# Patient Record
Sex: Male | Born: 1935 | Race: White | Hispanic: No | Marital: Married | Smoking: Never smoker
Health system: Southern US, Community
[De-identification: ages and names within clinical notes are randomized; demographics above are authoritative.]

## PROBLEM LIST (undated history)

## (undated) DIAGNOSIS — I2510 Atherosclerotic heart disease of native coronary artery without angina pectoris: Secondary | ICD-10-CM

## (undated) DIAGNOSIS — L309 Dermatitis, unspecified: Secondary | ICD-10-CM

## (undated) DIAGNOSIS — G4733 Obstructive sleep apnea (adult) (pediatric): Secondary | ICD-10-CM

## (undated) DIAGNOSIS — I4891 Unspecified atrial fibrillation: Secondary | ICD-10-CM

## (undated) DIAGNOSIS — E119 Type 2 diabetes mellitus without complications: Secondary | ICD-10-CM

## (undated) DIAGNOSIS — N183 Chronic kidney disease, stage 3 unspecified: Secondary | ICD-10-CM

## (undated) DIAGNOSIS — I209 Angina pectoris, unspecified: Secondary | ICD-10-CM

## (undated) DIAGNOSIS — G5600 Carpal tunnel syndrome, unspecified upper limb: Secondary | ICD-10-CM

## (undated) DIAGNOSIS — E538 Deficiency of other specified B group vitamins: Secondary | ICD-10-CM

## (undated) DIAGNOSIS — I252 Old myocardial infarction: Secondary | ICD-10-CM

## (undated) DIAGNOSIS — C4402 Squamous cell carcinoma of skin of lip: Secondary | ICD-10-CM

## (undated) DIAGNOSIS — M1990 Unspecified osteoarthritis, unspecified site: Secondary | ICD-10-CM

## (undated) DIAGNOSIS — R918 Other nonspecific abnormal finding of lung field: Secondary | ICD-10-CM

## (undated) DIAGNOSIS — D649 Anemia, unspecified: Secondary | ICD-10-CM

## (undated) DIAGNOSIS — I10 Essential (primary) hypertension: Secondary | ICD-10-CM

## (undated) DIAGNOSIS — E785 Hyperlipidemia, unspecified: Secondary | ICD-10-CM

## (undated) DIAGNOSIS — N400 Enlarged prostate without lower urinary tract symptoms: Secondary | ICD-10-CM

## (undated) HISTORY — PX: CORONARY ANGIOPLASTY WITH STENT PLACEMENT: SHX49

---

## 2003-12-13 ENCOUNTER — Ambulatory Visit (HOSPITAL_COMMUNITY): Admission: RE | Admit: 2003-12-13 | Discharge: 2003-12-14 | Payer: Self-pay | Admitting: *Deleted

## 2005-01-06 ENCOUNTER — Observation Stay (HOSPITAL_COMMUNITY): Admission: RE | Admit: 2005-01-06 | Discharge: 2005-01-07 | Payer: Self-pay | Admitting: Urology

## 2006-03-04 ENCOUNTER — Ambulatory Visit (HOSPITAL_BASED_OUTPATIENT_CLINIC_OR_DEPARTMENT_OTHER): Admission: RE | Admit: 2006-03-04 | Discharge: 2006-03-04 | Payer: Self-pay | Admitting: Urology

## 2006-05-13 ENCOUNTER — Encounter: Admission: RE | Admit: 2006-05-13 | Discharge: 2006-05-13 | Payer: Self-pay | Admitting: *Deleted

## 2006-05-13 IMAGING — CR DG CHEST 2V
2 series · 2 of 2 positions shown · non-contrast
Comparison: Report of [DATE] was reviewed.  The images were not available at the time of dictation.

CLINICAL DATA: Precardiac cath. 
CHEST ? 2 VIEW:

[w chest pa]
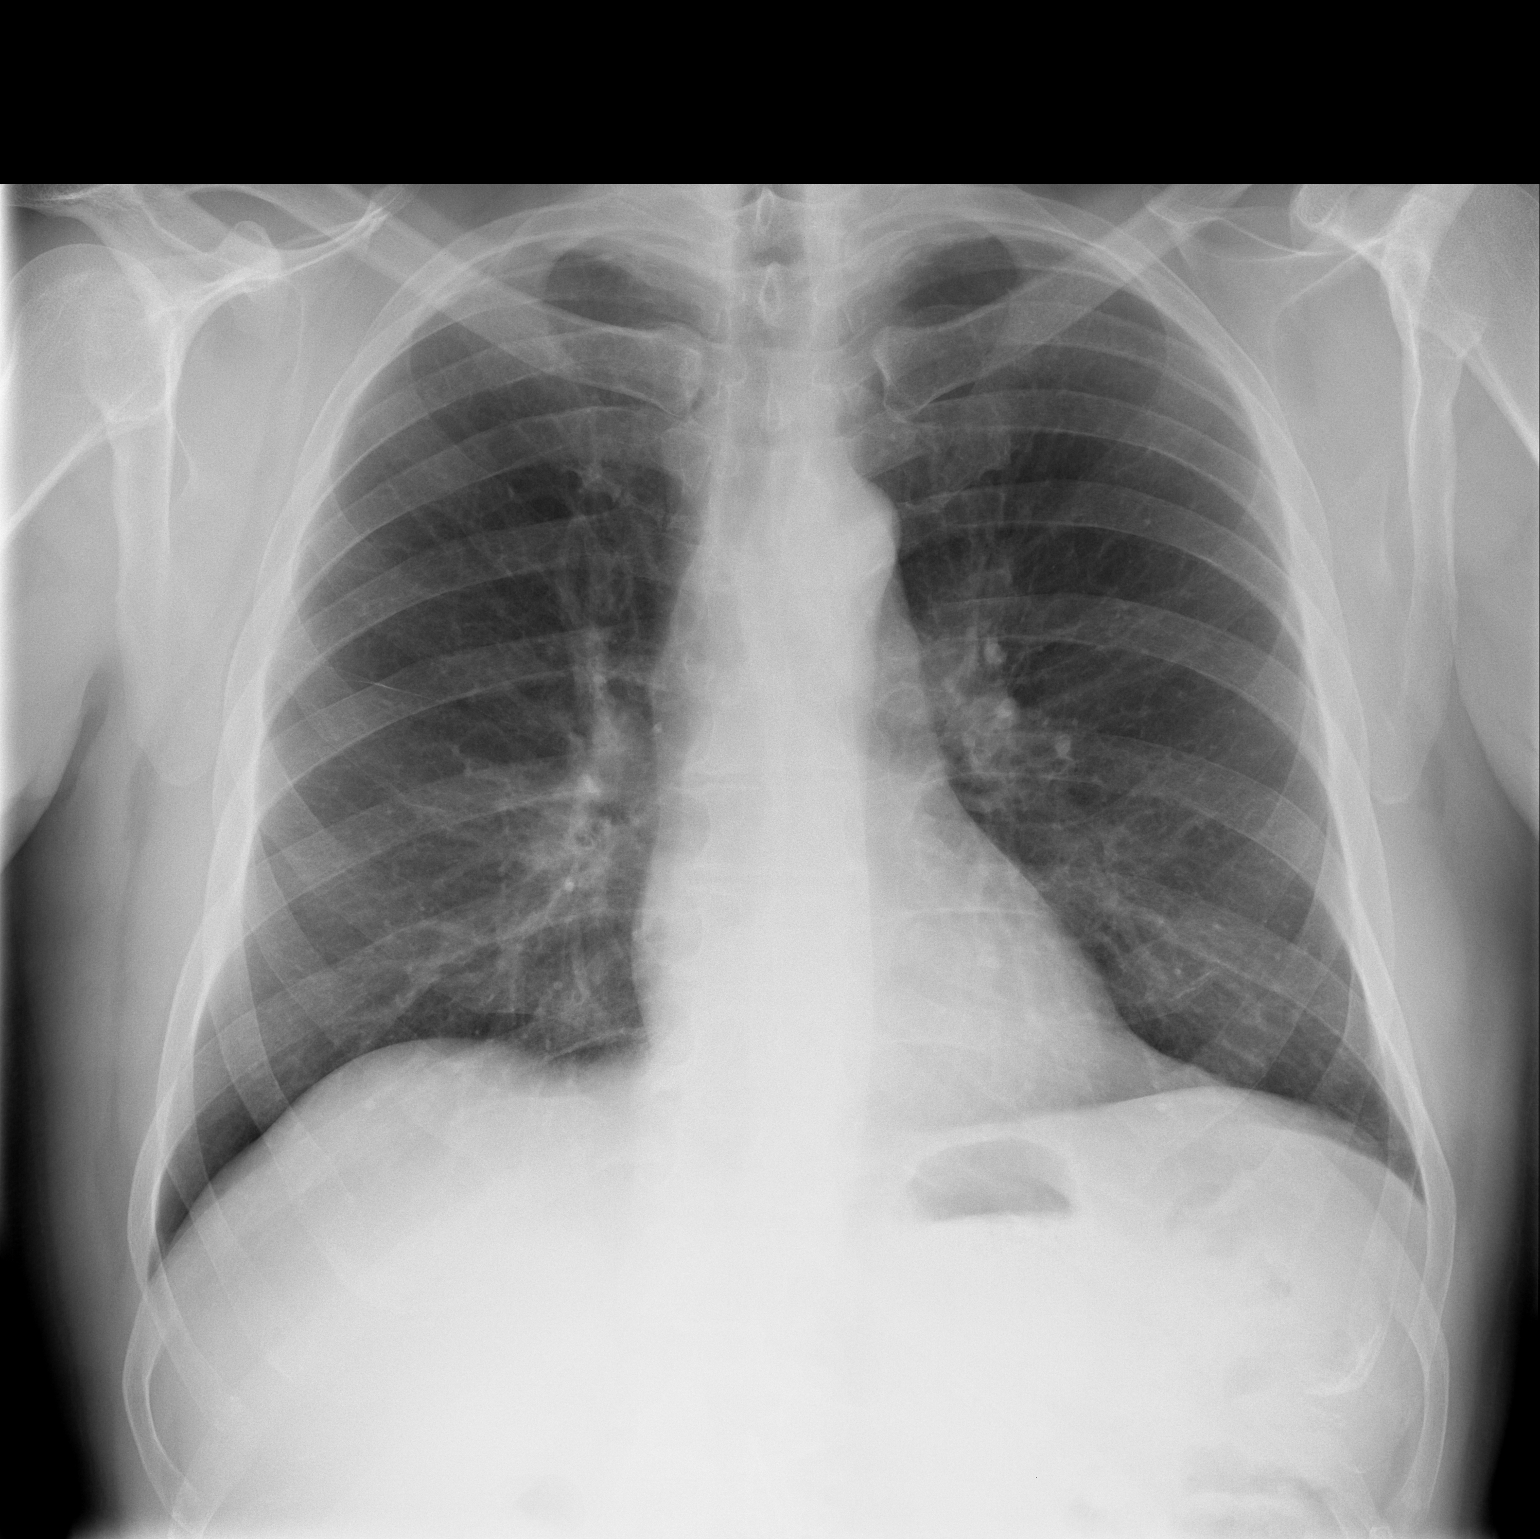

[w chest lat]
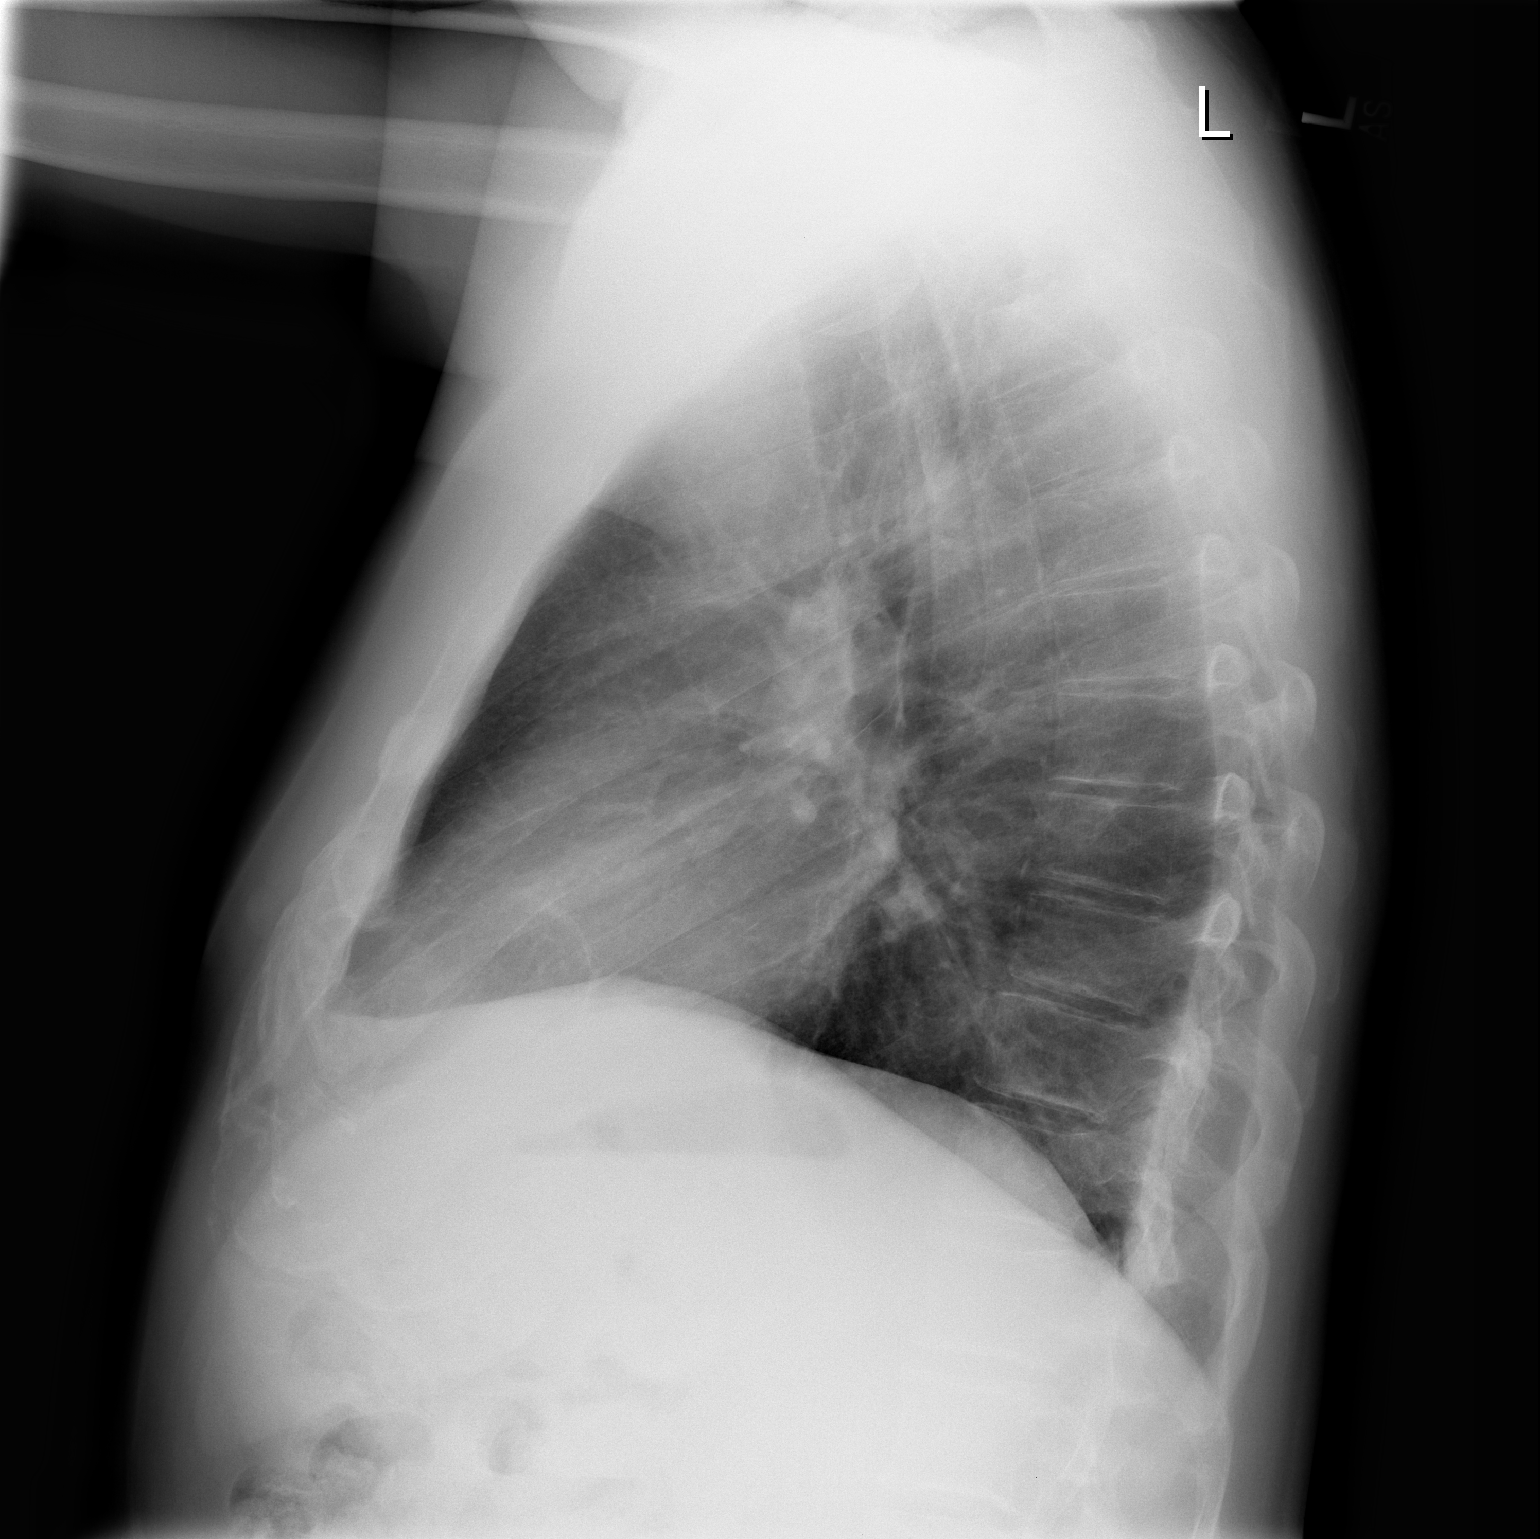

[2 of 2 positions shown; findings below may reference images not displayed]

FINDINGS: Trachea is midline.  Heart size normal.  car is seen in the right mid lung zone.  Biapical pleural thickening.  Lungs otherwise clear.  No pleural fluid.
IMPRESSION: No acute findings.

## 2006-05-20 ENCOUNTER — Ambulatory Visit (HOSPITAL_COMMUNITY): Admission: RE | Admit: 2006-05-20 | Discharge: 2006-05-21 | Payer: Self-pay | Admitting: *Deleted

## 2014-07-23 ENCOUNTER — Ambulatory Visit (HOSPITAL_BASED_OUTPATIENT_CLINIC_OR_DEPARTMENT_OTHER): Payer: Medicare HMO | Attending: Nurse Practitioner | Admitting: Radiology

## 2014-07-23 VITALS — Ht 71.0 in | Wt 190.0 lb

## 2014-07-23 DIAGNOSIS — G4733 Obstructive sleep apnea (adult) (pediatric): Secondary | ICD-10-CM | POA: Diagnosis present

## 2014-07-27 DIAGNOSIS — G4733 Obstructive sleep apnea (adult) (pediatric): Secondary | ICD-10-CM | POA: Diagnosis not present

## 2014-07-27 NOTE — Sleep Study (Signed)
   NAME: Duane [name] DATE OF BIRTH:  03/21/1935 MEDICAL RECORD NUMBER [id number]  LOCATION: [facility]  PHYSICIAN: Cheskel Silverio D  DATE OF STUDY: 07/23/2014  SLEEP STUDY TYPE: Nocturnal Polysomnogram               REFERRING PHYSICIAN: [name], NP  INDICATION FOR STUDY: Insomnia with sleep apnea  EPWORTH SLEEPINESS SCORE:   12/24 HEIGHT: 5\' 11"  (180.3 cm)  WEIGHT: 190 lb (86.183 kg)    Body mass index is 26.51 kg/(m^2).  NECK SIZE: 17 in.  MEDICATIONS: Charted for review  SLEEP ARCHITECTURE: Split study protocol. During the diagnostic phase, total sleep time 121.5 minutes with sleep efficiency 59.7%. Stage I was 35.4%, stage II 64.6%, stage III and REM were absent. Sleep latency 9.5 minutes, awake after sleep onset 74 minutes, arousal index 50.9, bedtime medication: None.  Sleep was markedly fragmented with repeated awakenings throughout the night.   RESPIRATORY DATA: Apnea hypopneas index (AHI) 21.2 per hour. 43 total events scored including 36 obstructive apneas, 2 mixed apneas, 5 hypopneas. Most events were while supine. CPAP was tolerated to 10 CWP, AHI 0 per hour. He wore a medium fullface mask.  OXYGEN DATA: Mild snoring before CPAP with oxygen desaturation to a nadir of 93% on room air. With CPAP control, snoring was prevented and mean oxygen saturation was 97.2% on room air.  CARDIAC DATA: Normal sinus rhythm  MOVEMENT/PARASOMNIA: No significant movement disturbance, bathroom x3  IMPRESSION/ RECOMMENDATION:   1) Moderate obstructive sleep apnea/hypopneas syndrome, AHI 21.2 per hour with supine events. Mild snoring with oxygen desaturation to a nadir of 93% on room air. 2) Successful CPAP titration to 10 CWP, AHI 0 per hour. He wore a medium ResMed AirFit F10 fullface mask with heated humidifier. Snoring was prevented and mean oxygen saturation was 97.2% on room air. 3) Market difficulty maintaining sleep throughout the night, only partly  explained by respiratory disturbance. Only a few minutes of sleep in REM. He likely will benefit from management for insomnia in addition to treatment for sleep apnea. Note sleep was also disturbed by nocturia x3.   Deneise Lever Diplomate, American Board of Sleep Medicine  ELECTRONICALLY SIGNED ON:  07/27/2014, 9:47 AM [place] PH: [phone]   FX: [phone] [place]

## 2016-09-08 ENCOUNTER — Encounter (HOSPITAL_BASED_OUTPATIENT_CLINIC_OR_DEPARTMENT_OTHER): Payer: Self-pay

## 2016-09-08 ENCOUNTER — Inpatient Hospital Stay (HOSPITAL_BASED_OUTPATIENT_CLINIC_OR_DEPARTMENT_OTHER)
Admission: EM | Admit: 2016-09-08 | Discharge: 2016-09-10 | DRG: 189 | Disposition: A | Discharge status: Home / Self Care | Payer: Medicare Other | Attending: Internal Medicine | Admitting: Internal Medicine

## 2016-09-08 ENCOUNTER — Emergency Department (HOSPITAL_BASED_OUTPATIENT_CLINIC_OR_DEPARTMENT_OTHER): Payer: Medicare Other

## 2016-09-08 DIAGNOSIS — M1990 Unspecified osteoarthritis, unspecified site: Secondary | ICD-10-CM | POA: Diagnosis not present

## 2016-09-08 DIAGNOSIS — N179 Acute kidney failure, unspecified: Secondary | ICD-10-CM | POA: Diagnosis present

## 2016-09-08 DIAGNOSIS — Z7984 Long term (current) use of oral hypoglycemic drugs: Secondary | ICD-10-CM | POA: Diagnosis not present

## 2016-09-08 DIAGNOSIS — R1011 Right upper quadrant pain: Secondary | ICD-10-CM

## 2016-09-08 DIAGNOSIS — N183 Chronic kidney disease, stage 3 (moderate): Secondary | ICD-10-CM | POA: Diagnosis not present

## 2016-09-08 DIAGNOSIS — I129 Hypertensive chronic kidney disease with stage 1 through stage 4 chronic kidney disease, or unspecified chronic kidney disease: Secondary | ICD-10-CM | POA: Diagnosis present

## 2016-09-08 DIAGNOSIS — F0390 Unspecified dementia without behavioral disturbance: Secondary | ICD-10-CM | POA: Diagnosis not present

## 2016-09-08 DIAGNOSIS — I2510 Atherosclerotic heart disease of native coronary artery without angina pectoris: Secondary | ICD-10-CM | POA: Diagnosis present

## 2016-09-08 DIAGNOSIS — I480 Paroxysmal atrial fibrillation: Secondary | ICD-10-CM | POA: Diagnosis present

## 2016-09-08 DIAGNOSIS — J9601 Acute respiratory failure with hypoxia: Secondary | ICD-10-CM | POA: Diagnosis not present

## 2016-09-08 DIAGNOSIS — R109 Unspecified abdominal pain: Secondary | ICD-10-CM | POA: Diagnosis present

## 2016-09-08 DIAGNOSIS — G4733 Obstructive sleep apnea (adult) (pediatric): Secondary | ICD-10-CM | POA: Diagnosis present

## 2016-09-08 DIAGNOSIS — Z7902 Long term (current) use of antithrombotics/antiplatelets: Secondary | ICD-10-CM | POA: Diagnosis not present

## 2016-09-08 DIAGNOSIS — I252 Old myocardial infarction: Secondary | ICD-10-CM

## 2016-09-08 DIAGNOSIS — D649 Anemia, unspecified: Secondary | ICD-10-CM | POA: Diagnosis present

## 2016-09-08 DIAGNOSIS — R0602 Shortness of breath: Secondary | ICD-10-CM

## 2016-09-08 DIAGNOSIS — Z7951 Long term (current) use of inhaled steroids: Secondary | ICD-10-CM

## 2016-09-08 DIAGNOSIS — R7989 Other specified abnormal findings of blood chemistry: Secondary | ICD-10-CM | POA: Diagnosis present

## 2016-09-08 DIAGNOSIS — E119 Type 2 diabetes mellitus without complications: Secondary | ICD-10-CM

## 2016-09-08 DIAGNOSIS — E871 Hypo-osmolality and hyponatremia: Secondary | ICD-10-CM | POA: Diagnosis present

## 2016-09-08 DIAGNOSIS — Z955 Presence of coronary angioplasty implant and graft: Secondary | ICD-10-CM | POA: Diagnosis not present

## 2016-09-08 DIAGNOSIS — J111 Influenza due to unidentified influenza virus with other respiratory manifestations: Secondary | ICD-10-CM

## 2016-09-08 DIAGNOSIS — Z888 Allergy status to other drugs, medicaments and biological substances status: Secondary | ICD-10-CM | POA: Diagnosis not present

## 2016-09-08 DIAGNOSIS — J9691 Respiratory failure, unspecified with hypoxia: Secondary | ICD-10-CM | POA: Diagnosis present

## 2016-09-08 DIAGNOSIS — Z79899 Other long term (current) drug therapy: Secondary | ICD-10-CM | POA: Diagnosis not present

## 2016-09-08 DIAGNOSIS — Z91041 Radiographic dye allergy status: Secondary | ICD-10-CM | POA: Diagnosis not present

## 2016-09-08 DIAGNOSIS — E1122 Type 2 diabetes mellitus with diabetic chronic kidney disease: Secondary | ICD-10-CM | POA: Diagnosis present

## 2016-09-08 DIAGNOSIS — Z91010 Allergy to peanuts: Secondary | ICD-10-CM | POA: Diagnosis not present

## 2016-09-08 DIAGNOSIS — Z7982 Long term (current) use of aspirin: Secondary | ICD-10-CM | POA: Diagnosis not present

## 2016-09-08 DIAGNOSIS — I10 Essential (primary) hypertension: Secondary | ICD-10-CM | POA: Diagnosis present

## 2016-09-08 DIAGNOSIS — Z85819 Personal history of malignant neoplasm of unspecified site of lip, oral cavity, and pharynx: Secondary | ICD-10-CM

## 2016-09-08 DIAGNOSIS — L309 Dermatitis, unspecified: Secondary | ICD-10-CM | POA: Diagnosis present

## 2016-09-08 DIAGNOSIS — E1140 Type 2 diabetes mellitus with diabetic neuropathy, unspecified: Secondary | ICD-10-CM | POA: Diagnosis present

## 2016-09-08 DIAGNOSIS — R69 Illness, unspecified: Secondary | ICD-10-CM

## 2016-09-08 DIAGNOSIS — E785 Hyperlipidemia, unspecified: Secondary | ICD-10-CM | POA: Diagnosis present

## 2016-09-08 DIAGNOSIS — R0902 Hypoxemia: Secondary | ICD-10-CM

## 2016-09-08 HISTORY — DX: Other nonspecific abnormal finding of lung field: R91.8

## 2016-09-08 HISTORY — DX: Anemia, unspecified: D64.9

## 2016-09-08 HISTORY — DX: Atherosclerotic heart disease of native coronary artery without angina pectoris: I25.10

## 2016-09-08 HISTORY — DX: Unspecified atrial fibrillation: I48.91

## 2016-09-08 HISTORY — DX: Chronic kidney disease, stage 3 (moderate): N18.3

## 2016-09-08 HISTORY — DX: Type 2 diabetes mellitus without complications: E11.9

## 2016-09-08 HISTORY — DX: Angina pectoris, unspecified: I20.9

## 2016-09-08 HISTORY — DX: Old myocardial infarction: I25.2

## 2016-09-08 HISTORY — DX: Unspecified osteoarthritis, unspecified site: M19.90

## 2016-09-08 HISTORY — DX: Obstructive sleep apnea (adult) (pediatric): G47.33

## 2016-09-08 HISTORY — DX: Hyperlipidemia, unspecified: E78.5

## 2016-09-08 HISTORY — DX: Deficiency of other specified B group vitamins: E53.8

## 2016-09-08 HISTORY — DX: Carpal tunnel syndrome, unspecified upper limb: G56.00

## 2016-09-08 HISTORY — DX: Squamous cell carcinoma of skin of lip: C44.02

## 2016-09-08 HISTORY — DX: Essential (primary) hypertension: I10

## 2016-09-08 HISTORY — DX: Benign prostatic hyperplasia without lower urinary tract symptoms: N40.0

## 2016-09-08 HISTORY — DX: Chronic kidney disease, stage 3 unspecified: N18.30

## 2016-09-08 HISTORY — DX: Dermatitis, unspecified: L30.9

## 2016-09-08 LAB — COMPREHENSIVE METABOLIC PANEL
ALT: 15 U/L — AB (ref 17–63)
AST: 23 U/L (ref 15–41)
Albumin: 3.8 g/dL (ref 3.5–5.0)
Alkaline Phosphatase: 61 U/L (ref 38–126)
Anion gap: 8 (ref 5–15)
BUN: 17 mg/dL (ref 6–20)
CHLORIDE: 97 mmol/L — AB (ref 101–111)
CO2: 23 mmol/L (ref 22–32)
CREATININE: 1.68 mg/dL — AB (ref 0.61–1.24)
Calcium: 8.6 mg/dL — ABNORMAL LOW (ref 8.9–10.3)
GFR calc Af Amer: 43 mL/min — ABNORMAL LOW (ref >60)
GFR calc non Af Amer: 37 mL/min — ABNORMAL LOW (ref >60)
GLUCOSE: 149 mg/dL — AB (ref 65–99)
Potassium: 4.3 mmol/L (ref 3.5–5.1)
SODIUM: 128 mmol/L — AB (ref 135–145)
Total Bilirubin: 0.9 mg/dL (ref 0.3–1.2)
Total Protein: 7.1 g/dL (ref 6.5–8.1)

## 2016-09-08 LAB — CBC WITH DIFFERENTIAL/PLATELET
BASOS ABS: 0 10*3/uL (ref 0.0–0.1)
Basophils Relative: 0 %
EOS ABS: 0 10*3/uL (ref 0.0–0.7)
EOS PCT: 0 %
HCT: 31.4 % — ABNORMAL LOW (ref 39.0–52.0)
Hemoglobin: 10.7 g/dL — ABNORMAL LOW (ref 13.0–17.0)
LYMPHS PCT: 14 %
Lymphs Abs: 2 10*3/uL (ref 0.7–4.0)
MCH: 27.1 pg (ref 26.0–34.0)
MCHC: 34.1 g/dL (ref 30.0–36.0)
MCV: 79.5 fL (ref 78.0–100.0)
Monocytes Absolute: 1.7 10*3/uL — ABNORMAL HIGH (ref 0.1–1.0)
Monocytes Relative: 12 %
Neutro Abs: 10.1 10*3/uL — ABNORMAL HIGH (ref 1.7–7.7)
Neutrophils Relative %: 74 %
PLATELETS: 171 10*3/uL (ref 150–400)
RBC: 3.95 MIL/uL — AB (ref 4.22–5.81)
RDW: 16.3 % — ABNORMAL HIGH (ref 11.5–15.5)
WBC: 13.8 10*3/uL — AB (ref 4.0–10.5)

## 2016-09-08 LAB — URINALYSIS, ROUTINE W REFLEX MICROSCOPIC
BILIRUBIN URINE: NEGATIVE
Glucose, UA: NEGATIVE mg/dL
HGB URINE DIPSTICK: NEGATIVE
Ketones, ur: NEGATIVE mg/dL
Leukocytes, UA: NEGATIVE
Nitrite: NEGATIVE
PH: 7.5 (ref 5.0–8.0)
Protein, ur: NEGATIVE mg/dL
SPECIFIC GRAVITY, URINE: 1.01 (ref 1.005–1.030)

## 2016-09-08 LAB — TROPONIN I: Troponin I: <0.03 ng/mL (ref <0.03)

## 2016-09-08 LAB — BRAIN NATRIURETIC PEPTIDE: B Natriuretic Peptide: 341.4 pg/mL — ABNORMAL HIGH (ref 0.0–100.0)

## 2016-09-08 LAB — INFLUENZA PANEL BY PCR (TYPE A & B)
H1N1 flu by pcr: NOT DETECTED
INFLAPCR: NEGATIVE
INFLBPCR: NEGATIVE

## 2016-09-08 LAB — LIPASE, BLOOD: LIPASE: 27 U/L (ref 11–51)

## 2016-09-08 LAB — I-STAT CG4 LACTIC ACID, ED: Lactic Acid, Venous: 1.49 mmol/L (ref 0.5–1.9)

## 2016-09-08 LAB — GLUCOSE, CAPILLARY: GLUCOSE-CAPILLARY: 145 mg/dL — AB (ref 65–99)

## 2016-09-08 IMAGING — CR DG CHEST 2V
2 series · 2 of 2 positions shown · non-contrast
Comparison: [DATE]

CLINICAL DATA: Weakness.  Pain all over.

EXAM:
CHEST  2 VIEW

[w chest pa]
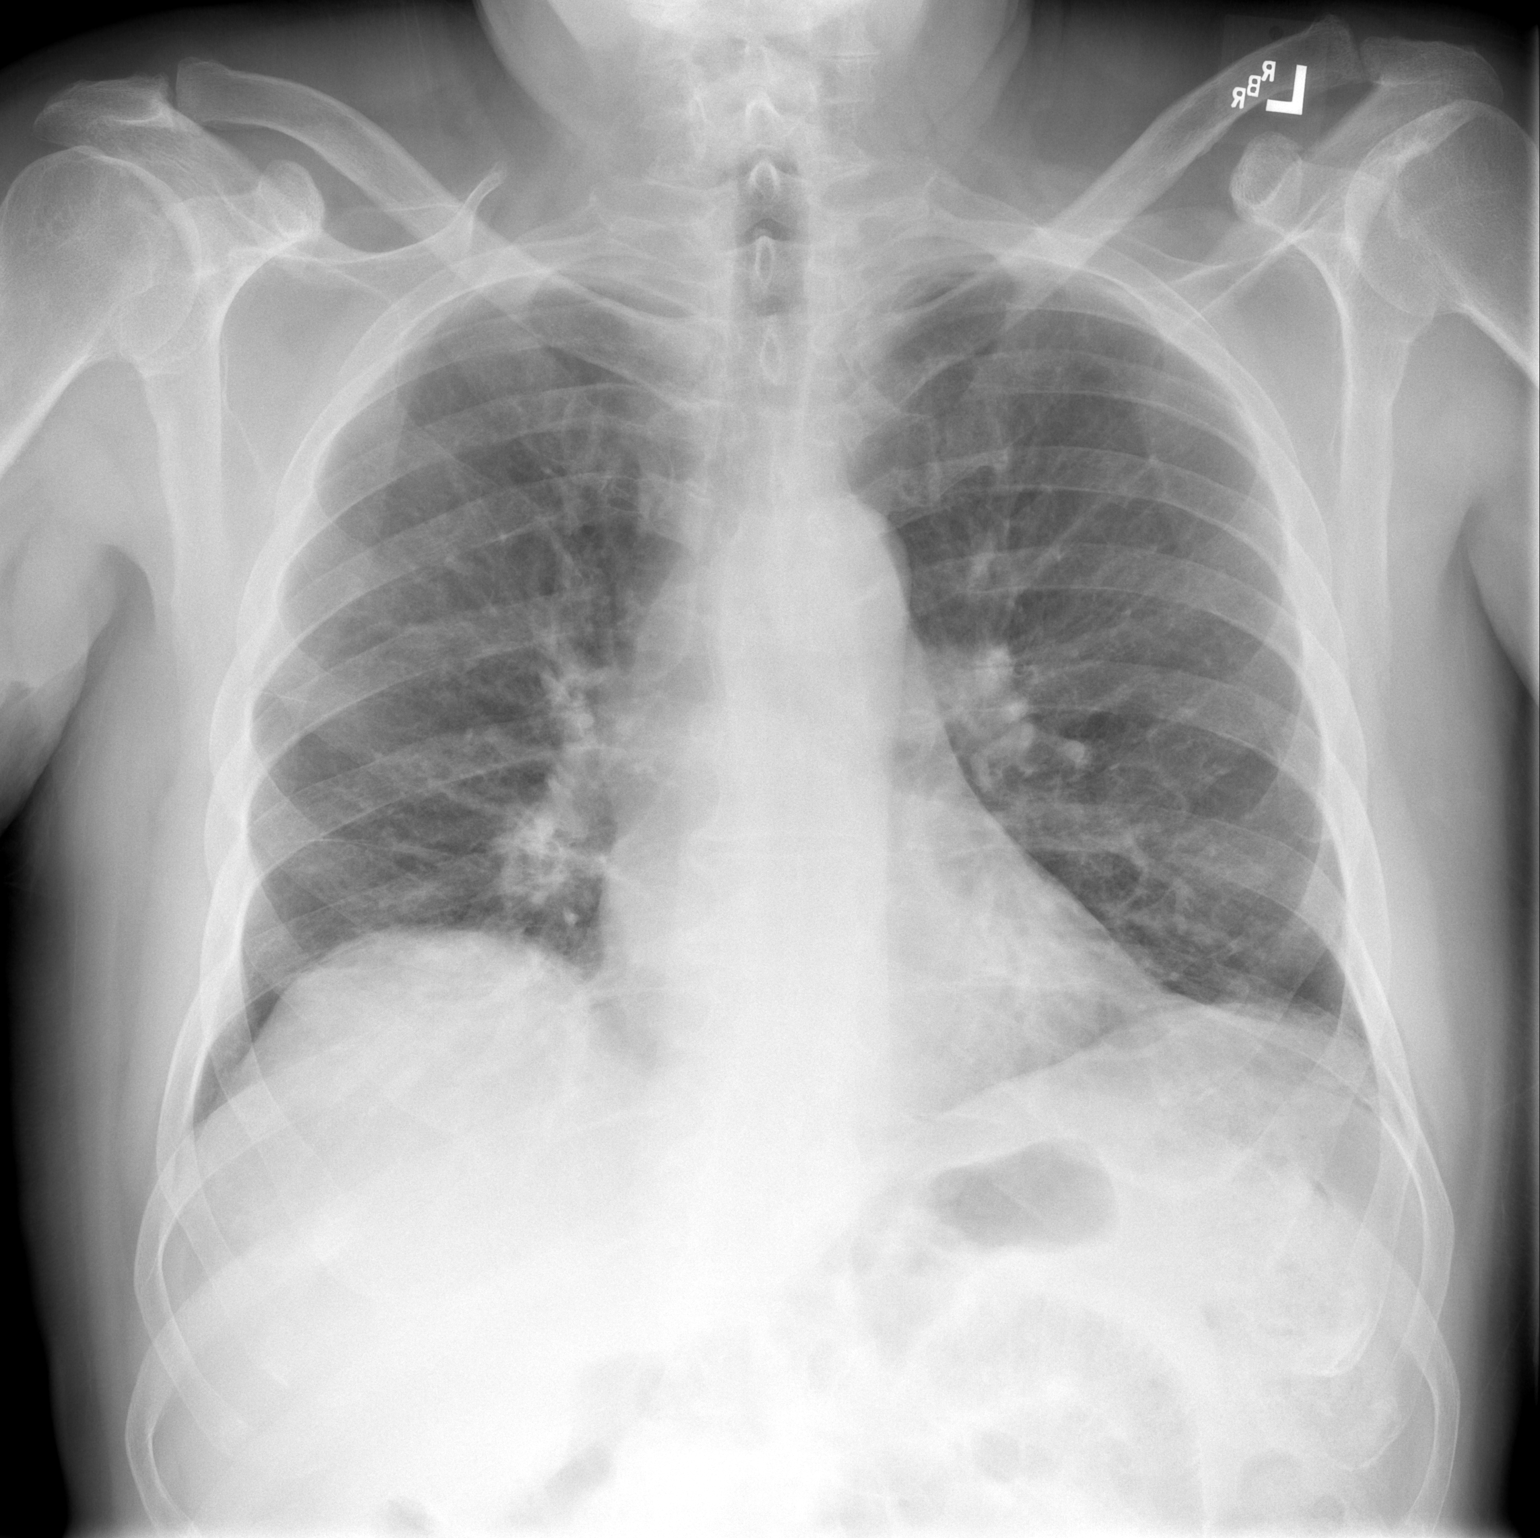

[w chest lat]
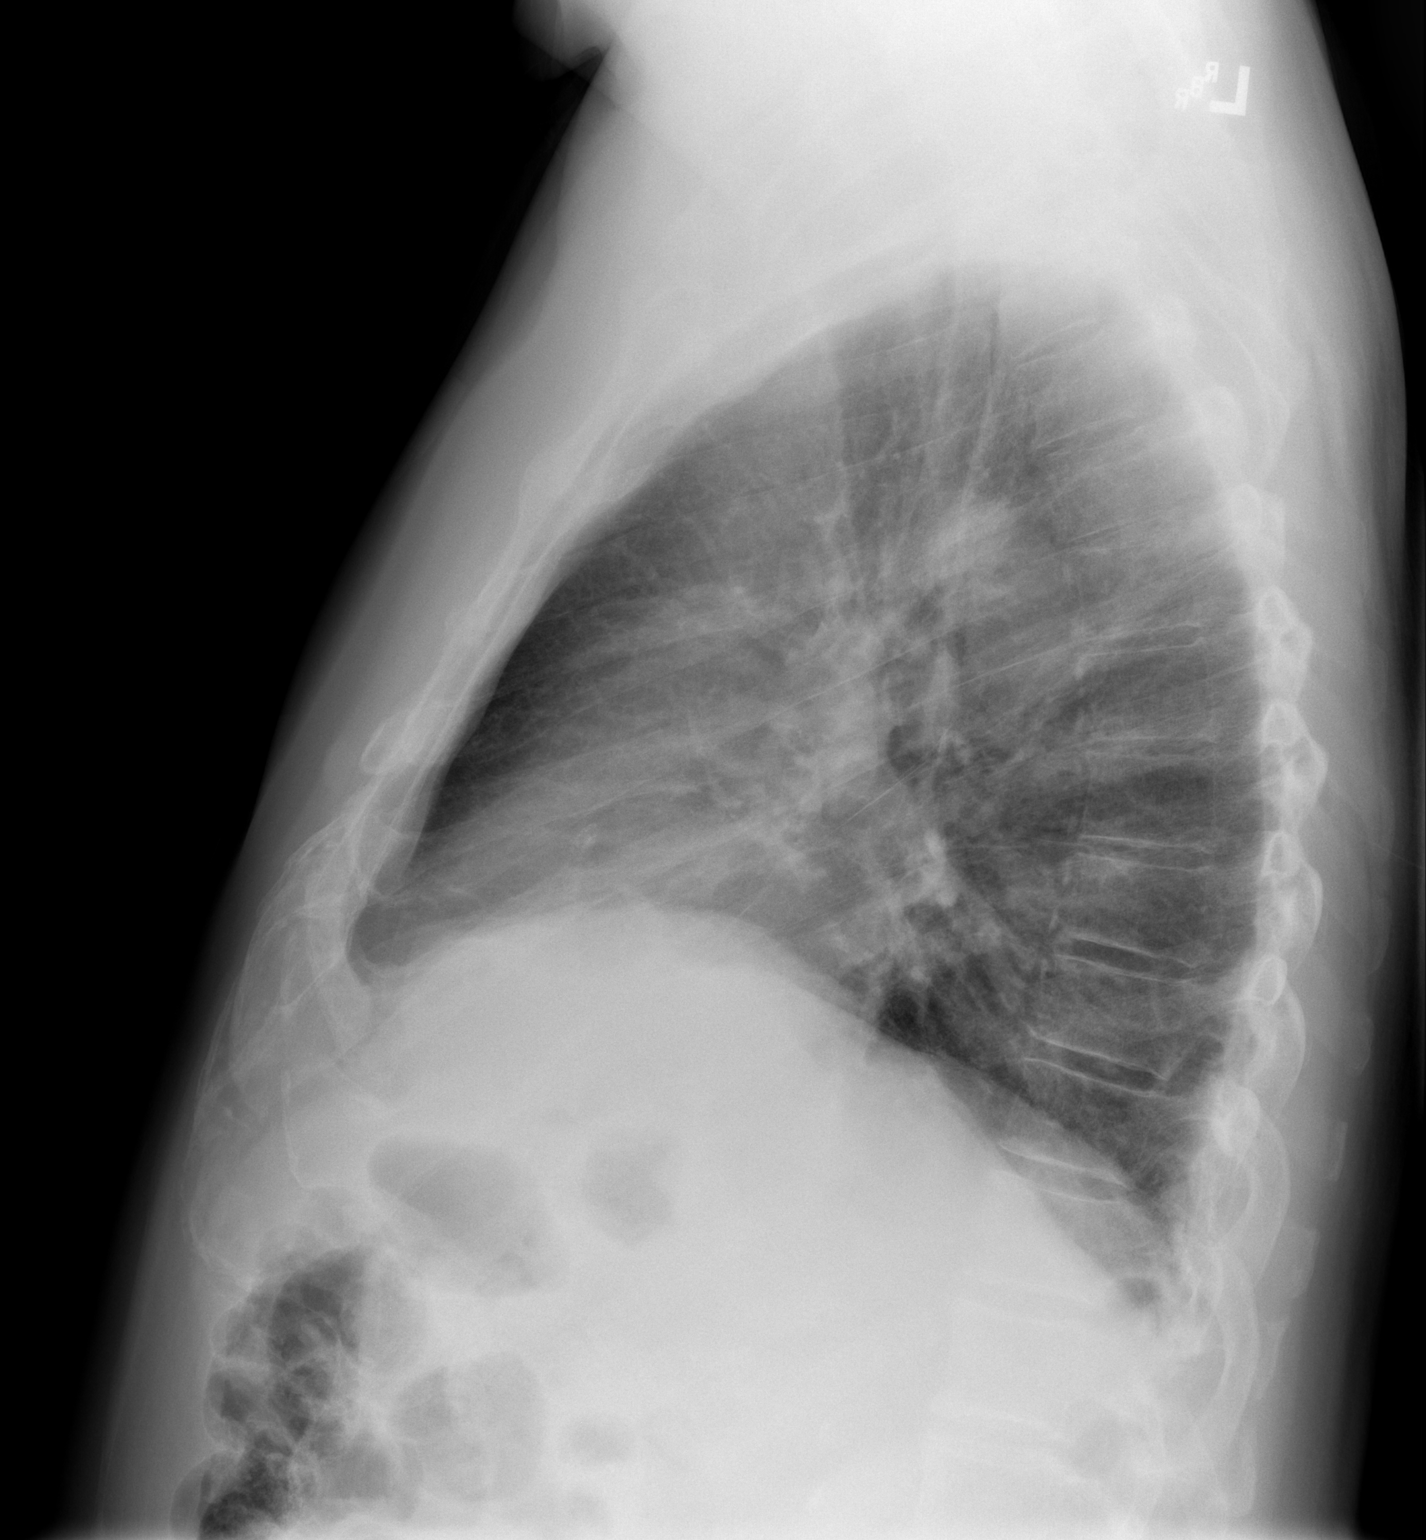

[2 of 2 positions shown; findings below may reference images not displayed]

FINDINGS: The heart size and mediastinal contours are within normal limits.
Both lungs are clear. No pleural effusion or pneumothorax. The
visualized skeletal structures are unremarkable.
IMPRESSION: No active cardiopulmonary disease.

## 2016-09-08 MED ORDER — OSELTAMIVIR PHOSPHATE 75 MG PO CAPS
75.0000 mg | ORAL_CAPSULE | Freq: Once | ORAL | Status: AC
  Administered 2016-09-08: 75 mg via ORAL
  Filled 2016-09-08: qty 1

## 2016-09-08 MED ORDER — DEXTROSE 5 % IV SOLN
2.0000 g | INTRAVENOUS | Status: DC
  Administered 2016-09-08: 2 g via INTRAVENOUS
  Filled 2016-09-08 (×2): qty 2

## 2016-09-08 MED ORDER — AMIODARONE HCL 200 MG PO TABS
200.0000 mg | ORAL_TABLET | Freq: Every day | ORAL | Status: DC
Start: 2016-09-09 — End: 2016-09-10
  Administered 2016-09-08 – 2016-09-10 (×3): 200 mg via ORAL
  Filled 2016-09-08 (×3): qty 1

## 2016-09-08 MED ORDER — ONDANSETRON HCL 4 MG/2ML IJ SOLN
4.0000 mg | Freq: Four times a day (QID) | INTRAMUSCULAR | Status: DC | PRN
Start: 2016-09-08 — End: 2016-09-10

## 2016-09-08 MED ORDER — ONDANSETRON HCL 4 MG PO TABS: 4.0000 mg | ORAL_TABLET | Freq: Four times a day (QID) | ORAL | Status: DC | PRN

## 2016-09-08 MED ORDER — INSULIN ASPART 100 UNIT/ML ~~LOC~~ SOLN
0.0000 [IU] | Freq: Three times a day (TID) | SUBCUTANEOUS | Status: DC
  Administered 2016-09-09: 2 [IU] via SUBCUTANEOUS
  Administered 2016-09-09: 1 [IU] via SUBCUTANEOUS
  Administered 2016-09-09 – 2016-09-10 (×2): 2 [IU] via SUBCUTANEOUS

## 2016-09-08 MED ORDER — METOPROLOL SUCCINATE ER 25 MG PO TB24
25.0000 mg | ORAL_TABLET | Freq: Every day | ORAL | Status: DC
  Administered 2016-09-08 – 2016-09-10 (×3): 25 mg via ORAL
  Filled 2016-09-08 (×3): qty 1

## 2016-09-08 MED ORDER — FLUTICASONE PROPIONATE 50 MCG/ACT NA SUSP
2.0000 | Freq: Every day | NASAL | Status: DC
  Administered 2016-09-09: 2 via NASAL
  Filled 2016-09-08: qty 16

## 2016-09-08 MED ORDER — GABAPENTIN 300 MG PO CAPS
300.0000 mg | ORAL_CAPSULE | Freq: Three times a day (TID) | ORAL | Status: DC
  Administered 2016-09-08: 300 mg via ORAL
  Filled 2016-09-08 (×3): qty 1

## 2016-09-08 MED ORDER — SODIUM CHLORIDE 0.9 % IV SOLN
INTRAVENOUS | Status: DC
  Administered 2016-09-08 – 2016-09-09 (×3): via INTRAVENOUS

## 2016-09-08 MED ORDER — ACETAMINOPHEN 325 MG PO TABS
650.0000 mg | ORAL_TABLET | Freq: Four times a day (QID) | ORAL | Status: DC | PRN
Start: 2016-09-08 — End: 2016-09-10
  Administered 2016-09-08: 650 mg via ORAL
  Filled 2016-09-08: qty 2

## 2016-09-08 MED ORDER — ASPIRIN 81 MG PO CHEW
81.0000 mg | CHEWABLE_TABLET | Freq: Every day | ORAL | Status: DC
  Administered 2016-09-08 – 2016-09-09 (×2): 81 mg via ORAL
  Filled 2016-09-08 (×3): qty 1

## 2016-09-08 MED ORDER — ACETAMINOPHEN 650 MG RE SUPP: 650.0000 mg | Freq: Four times a day (QID) | RECTAL | Status: DC | PRN

## 2016-09-08 MED ORDER — PRAVASTATIN SODIUM 40 MG PO TABS
40.0000 mg | ORAL_TABLET | Freq: Every day | ORAL | Status: DC
  Administered 2016-09-08 – 2016-09-09 (×2): 40 mg via ORAL
  Filled 2016-09-08 (×2): qty 1

## 2016-09-08 MED ORDER — AMLODIPINE BESYLATE 5 MG PO TABS
5.0000 mg | ORAL_TABLET | Freq: Every day | ORAL | Status: DC
  Administered 2016-09-08 – 2016-09-10 (×3): 5 mg via ORAL
  Filled 2016-09-08 (×3): qty 1

## 2016-09-08 MED ORDER — DONEPEZIL HCL 10 MG PO TABS
10.0000 mg | ORAL_TABLET | Freq: Every day | ORAL | Status: DC
  Administered 2016-09-09: 10 mg via ORAL
  Filled 2016-09-08: qty 1

## 2016-09-08 NOTE — ED Notes (Signed)
Patient placed on 2L via [place] due to O2 saturation decreasing during stay. Patient around 88-92% O2 upon applying O2 the patient's O2 saturation increased to 96%.

## 2016-09-08 NOTE — ED Notes (Signed)
Provider at bedside

## 2016-09-08 NOTE — ED Notes (Signed)
Report given to [facility] eta 35 min

## 2016-09-08 NOTE — Progress Notes (Signed)
Pharmacy Antibiotic Note  Duane [name] is a 81 y.o. male admitted on 09/08/2016 with intra-abdominal infection.  Pharmacy has been consulted for ceftriaxone dosing.  Plan: - ceftriaxone 2g iv q24h  Height: 5\' 11"  (180.3 cm) Weight: 211 lb 6.4 oz (95.9 kg) IBW/kg (Calculated) : 75.3  Temp (24hrs), Avg:99.1 F (37.3 C), Min:97.8 F (36.6 C), Max:100.9 F (38.3 C)   Recent Labs Lab 09/08/16 1405 09/08/16 1429  WBC 13.8*  --   CREATININE 1.68*  --   LATICACIDVEN  --  1.49    Estimated Creatinine Clearance: 42.1 mL/min (by C-G formula based on SCr of 1.68 mg/dL (H)).    Allergies  Allergen Reactions  . Contrast Media [Iodinated Diagnostic Agents] Anaphylaxis    Thank you for allowing pharmacy to be a part of this patient's care.  Lorie Melichar, Tsz-Yin 09/08/2016 9:43 PM

## 2016-09-08 NOTE — Progress Notes (Signed)
New Admission Note:  Arrival Method: Ambulance stretcher Mental Orientation: Alert and oriented x 4 Telemetry: Box 22  Assessment: Completed Skin: Warm and dry IV: NSL Pain: Denies  Tubes: N/A Safety Measures: Safety Fall Prevention Plan was given, discussed and signed. Admission: Completed [address] [place] Orientation: Patient has been orientated to the room, unit and the staff. Family: At bedside.  Patient is placed under droplet precautions.   Orders have been reviewed and implemented. Will continue to monitor the patient. Call light has been placed within reach and bed alarm has been activated.   Sima Matas BSN, RN  Phone Number: [phone]

## 2016-09-08 NOTE — H&P (Signed)
History and Physical    HUNT ZAJICEK ZOX:[id number] DOB: 1935-03-23 DOA: 09/08/2016  PCP: [name], MD  Patient coming from: Home.  Chief Complaint: Generalized body ache.  HPI: Duane [name] is a 81 y.o. male with history of paroxysmal atrial fibrillation, hypertension, chronic kidney disease, diabetes mellitus2 presents to the ER because of not feeling well over the last few days. Patient states he has been feeling weak and generalized body aches. Patient had gone to the ER [facility] day before and had CT abdomen and pelvis which as per the ER physician has been unremarkable. Results are not available to [place] at this time. Since patient has been having persistent symptoms patient was brought to the [facility]. At [facility] patient was found to be mildly febrile and hypoxic. Chest x-ray was unremarkable BNP was mildly elevated. Influenza PCR was done which was negative. Patient also has recently noticed increasing swelling in the lower extremities and also was recently started on amlodipine for blood pressure. Patient is being admitted for further management.  On my exam patient has significant right upper quadrant tenderness with increase on deep inspiration. Has been having poor appetite over the last few days. Denies nausea vomiting or diarrhea.  ED Course: See history of presenting illness.  Review of Systems: As per HPI, rest all negative.   Past Medical History:  Diagnosis Date  . A-fib ([place])   . Anemia   . Angina pectoris ([place])   . Arthritis   . Carpal tunnel syndrome   . Cobalamin deficiency   . Coronary artery disease   . Diabetes mellitus without complication ([place])   . Eczema   . Hyperlipemia   . Hypertension   . Kidney disease, chronic, stage III (moderate, EGFR 30-59 ml/min)   . Lung infiltrate   . Myocardial infarct, old   . Prostatic hypertrophy   . Sleep apnea, obstructive   . Squamous cell cancer of lip      Past Surgical History:  Procedure Laterality Date  . CORONARY ANGIOPLASTY WITH STENT PLACEMENT       reports that he has never smoked. He has never used smokeless tobacco. He reports that he does not drink alcohol. His drug history is not on file.  Allergies  Allergen Reactions  . Contrast Media [Iodinated Diagnostic Agents] Anaphylaxis    Family History  Problem Relation Age of Onset  . Hypertension Other     Prior to Admission medications   Medication Sig Start Date End Date Taking? Authorizing Provider  amiodarone (PACERONE) 200 MG tablet Take 200 mg by mouth daily.   Yes Historical Provider, MD  amLODipine (NORVASC) 5 MG tablet Take 5 mg by mouth daily.   Yes Historical Provider, MD  aspirin 81 MG chewable tablet Chew by mouth daily.   Yes Historical Provider, MD  clopidogrel (PLAVIX) 75 MG tablet Take 75 mg by mouth daily.   Yes Historical Provider, MD  donepezil (ARICEPT) 10 MG tablet Take 10 mg by mouth at bedtime.   Yes Historical Provider, MD  EPINEPHrine (EPIPEN IJ) Inject as directed.   Yes Historical Provider, MD  fluticasone (FLONASE) 50 MCG/ACT nasal spray Place into both nostrils daily.   Yes Historical Provider, MD  gabapentin (NEURONTIN) 300 MG capsule Take 300 mg by mouth 3 (three) times daily.   Yes Historical Provider, MD  losartan (COZAAR) 100 MG tablet Take 100 mg by mouth daily.   Yes Historical Provider,  MD  metformin (FORTAMET) 1000 MG (OSM) 24 hr tablet Take 1,000 mg by mouth 2 (two) times daily with a meal.   Yes Historical Provider, MD  metoprolol succinate (TOPROL-XL) 25 MG 24 hr tablet Take 25 mg by mouth daily.   Yes Historical Provider, MD  pravastatin (PRAVACHOL) 40 MG tablet Take 40 mg by mouth daily.   Yes Historical Provider, MD    Physical Exam: Vitals:   09/08/16 1700 09/08/16 1730 09/08/16 1800 09/08/16 1932  BP: (!) 115/49 (!) 135/53 (!) 136/53 (!) 134/51  Pulse: 72 86 76 72  Resp: 16 18 18 20   Temp:    97.8 F (36.6 C)  TempSrc:     Oral  SpO2: 95% 96% 94% 96%  Weight:    95.9 kg (211 lb 6.4 oz)  Height:          Constitutional: Moderately built and nourished. Vitals:   09/08/16 1700 09/08/16 1730 09/08/16 1800 09/08/16 1932  BP: (!) 115/49 (!) 135/53 (!) 136/53 (!) 134/51  Pulse: 72 86 76 72  Resp: 16 18 18 20   Temp:    97.8 F (36.6 C)  TempSrc:    Oral  SpO2: 95% 96% 94% 96%  Weight:    95.9 kg (211 lb 6.4 oz)  Height:       Eyes: Anicteric no pallor. ENMT: No discharge from the ears eyes nose or mouth. Neck: No mass felt. No JVD appreciated. Respiratory: No rhonchi or crepitations. Cardiovascular: S1 and S2 heard. No murmurs appreciated. Abdomen: Right upper quadrant tenderness. No guarding or rigidity. Musculoskeletal: No edema. No joint effusion. Skin: No rash. Skin appears warm. Neurologic: Alert awake oriented to time place and person. Moves all extremities. Psychiatric: Appears normal. Has some memory issues.   Labs on Admission: I have personally reviewed following labs and imaging studies  CBC:  Recent Labs Lab 09/08/16 1405  WBC 13.8*  NEUTROABS 10.1*  HGB 10.7*  HCT 31.4*  MCV 79.5  PLT 948   Basic Metabolic Panel:  Recent Labs Lab 09/08/16 1405  NA 128*  K 4.3  CL 97*  CO2 23  GLUCOSE 149*  BUN 17  CREATININE 1.68*  CALCIUM 8.6*   GFR: Estimated Creatinine Clearance: 42.1 mL/min (by C-G formula based on SCr of 1.68 mg/dL (H)). Liver Function Tests:  Recent Labs Lab 09/08/16 1405  AST 23  ALT 15*  ALKPHOS 61  BILITOT 0.9  PROT 7.1  ALBUMIN 3.8    Recent Labs Lab 09/08/16 1405  LIPASE 27   No results for input(s): AMMONIA in the last 168 hours. Coagulation Profile: No results for input(s): INR, PROTIME in the last 168 hours. Cardiac Enzymes:  Recent Labs Lab 09/08/16 1405  TROPONINI <0.03   BNP (last 3 results) No results for input(s): PROBNP in the last 8760 hours. HbA1C: No results for input(s): HGBA1C in the last 72  hours. CBG:  Recent Labs Lab 09/08/16 1936  GLUCAP 145*   Lipid Profile: No results for input(s): CHOL, HDL, LDLCALC, TRIG, CHOLHDL, LDLDIRECT in the last 72 hours. Thyroid Function Tests: No results for input(s): TSH, T4TOTAL, FREET4, T3FREE, THYROIDAB in the last 72 hours. Anemia Panel: No results for input(s): VITAMINB12, FOLATE, FERRITIN, TIBC, IRON, RETICCTPCT in the last 72 hours. Urine analysis:    Component Value Date/Time   COLORURINE YELLOW 09/08/2016 1450   APPEARANCEUR CLEAR 09/08/2016 1450   LABSPEC 1.010 09/08/2016 1450   PHURINE 7.5 09/08/2016 1450   GLUCOSEU NEGATIVE 09/08/2016 1450   HGBUR  NEGATIVE 09/08/2016 1450   BILIRUBINUR NEGATIVE 09/08/2016 1450   KETONESUR NEGATIVE 09/08/2016 1450   PROTEINUR NEGATIVE 09/08/2016 1450   NITRITE NEGATIVE 09/08/2016 1450   LEUKOCYTESUR NEGATIVE 09/08/2016 1450   Sepsis Labs: @LABRCNTIP (procalcitonin:4,lacticidven:4) ) Recent Results (from the past 240 hour(s))  Blood culture (routine x 2)     Status: None (Preliminary result)   Collection Time: 09/08/16  4:41 PM  Result Value Ref Range Status   Specimen Description   Final    BLOOD RIGHT ANTECUBITAL Performed at [facility]    Special Requests BOTTLES DRAWN AEROBIC AND ANAEROBIC 5CC  Final   Culture PENDING  Incomplete   Report Status PENDING  Incomplete  Blood culture (routine x 2)     Status: None (Preliminary result)   Collection Time: 09/08/16  4:55 PM  Result Value Ref Range Status   Specimen Description   Final    BLOOD RIGHT HAND Performed at [facility]    Special Requests BOTTLES DRAWN AEROBIC AND ANAEROBIC 5CC  Final   Culture PENDING  Incomplete   Report Status PENDING  Incomplete     Radiological Exams on Admission: Dg Chest 2 View  Result Date: 09/08/2016 CLINICAL DATA:  Weakness.  Pain all over. EXAM: CHEST  2 VIEW COMPARISON:  09/07/2016 FINDINGS: The heart size and mediastinal contours are within normal limits. Both lungs  are clear. No pleural effusion or pneumothorax. The visualized skeletal structures are unremarkable. IMPRESSION: No active cardiopulmonary disease. Electronically Signed   By: Lajean Manes M.D.   On: 09/08/2016 14:10    EKG: Independently reviewed. Normal sinus rhythm with nonspecific ST changes.  Assessment/Plan Principal Problem:   Respiratory failure with hypoxia (HCC) Active Problems:   Abdominal pain, right upper quadrant   Essential hypertension   PAF (paroxysmal atrial fibrillation) (HCC)   Diabetes mellitus type 2, controlled ([place])   ARF (acute renal failure) (HCC)   Normochromic normocytic anemia   Abdominal pain    1. Abdominal pain mostly in the right upper quadrant - will check sonogram of the abdomen  for acute cholecystitis. Until then patient will be on clear liquid diet and ceftriaxone. Since patient also was mildly hypoxic and pain increases on deep inspiration there was concern for PE for which we will order a VQ scan and Dopplers of the lower extremity as patient also has lower extremity edema. 2. Hypoxia - check VQ scan. Patient has mildly elevated BNP though clinically looks dry. 3. Paroxysmal atrial fibrillation - presently in sinus rhythm. Continue amiodarone and beta blockers. Patient is only on aspirin and Plavix and not on anticoagulation not sure of the reason. May have to discuss with patient's son in a.m. Unable to reach son at this time. Holding off Plavix in anticipation of procedure. 4. Hypertension - will hold off Cozaar due to renal failure. Continue amlodipine and when necessary IV hydralazine. Closely follow metabolic panel. 5. Renal failure probably acute on chronic - gently hydrating at this time and holding Cozaar. Follow metabolic panel intake output. 6. Diabetes mellitus type 2 - on sliding-scale coverage. 7. Anemia - follow CBC.  Patient does have memory problems. Need to get further detailed history once patient's family available.  Blood  cultures are pending. Blood cultures were done since patient was mildly febrile on arrival.  I have reviewed patient's old charts in care everywhere.   DVT prophylaxis: SCDs. Code Status: Full code.  Family Communication: Unable to reach son.  Disposition Plan: Home.  Consults called: None.  Admission status: Inpatient.    Rise Patience MD [facility] Pager [phone].  If 7PM-7AM, please contact night-coverage [email or web address] Password [email or web address]  09/08/2016, 9:41 PM

## 2016-09-08 NOTE — ED Provider Notes (Signed)
[facility] Provider Note   CSN: [id number] Arrival date & time: 09/08/16  1221     History   Chief Complaint Chief Complaint  Patient presents with  . Generalized Body Aches    HPI Duane [name] is a 81 y.o. male.  HPI  81 year old male with a history of atrial fibrillation, coronary artery disease, hypertension, hyperlipidemia, diabetes presents with concern for generalized weakness, bodyaches, nasal congestion, leg swelling and elevated temperature.  Patient and family report that he had generalized weakness and abdominal pain and presented yesterday to the [facility] emergency department, where a CT of the abdomen and pelvis was obtained which showed no abnormalities.  Severe abdominal pain yesterday, ate 2-3 bites of food then felt full in several days. Now no BM in the last few days, don't remember last BM. Passing gas. Allergy to IV contrast. Was drinking barium last night and threw it up. Did CT scan and blood work CT looked ok but hadn't finished contrast po.  No black or bloody stools. Today abdomen improved.   Today swelling in his legs and puffiness under eyes, there yesteday worse today.  Not taking medications.  Abdomen swollen.  Generalized weakness, has not eaten or had medications.   Family reports he has had intermittent slurred speech but none in last few days, thought it was secondary to medications. Pt denies any other neuro symptoms, no numbness/weakness/diff talking/change in vision  Family reports they came today, as he was evaluated as an outpatient, was seen to have bilateral lower extremity swelling, temperature of 100.0, and was recommended to come to the emergency department.  While in the ED, patient developed right lower rib pleuritic pain, severe, sharp with deep breaths.   Past Medical History:  Diagnosis Date  . A-fib ([place])   . Anemia   . Angina pectoris ([place])   . Arthritis   . Carpal tunnel syndrome   . Cobalamin  deficiency   . Coronary artery disease   . Diabetes mellitus without complication ([place])   . Eczema   . Hyperlipemia   . Hypertension   . Kidney disease, chronic, stage III (moderate, EGFR 30-59 ml/min)   . Lung infiltrate   . Myocardial infarct, old   . Prostatic hypertrophy   . Sleep apnea, obstructive   . Squamous cell cancer of lip     Patient Active Problem List   Diagnosis Date Noted  . Respiratory failure with hypoxia ([place]) 09/08/2016    Past Surgical History:  Procedure Laterality Date  . CORONARY ANGIOPLASTY WITH STENT PLACEMENT         Home Medications    Prior to Admission medications   Medication Sig Start Date End Date Taking? Authorizing Provider  amiodarone (PACERONE) 200 MG tablet Take 200 mg by mouth daily.   Yes Historical Provider, MD  amLODipine (NORVASC) 5 MG tablet Take 5 mg by mouth daily.   Yes Historical Provider, MD  aspirin 81 MG chewable tablet Chew by mouth daily.   Yes Historical Provider, MD  clopidogrel (PLAVIX) 75 MG tablet Take 75 mg by mouth daily.   Yes Historical Provider, MD  donepezil (ARICEPT) 10 MG tablet Take 10 mg by mouth at bedtime.   Yes Historical Provider, MD  EPINEPHrine (EPIPEN IJ) Inject as directed.   Yes Historical Provider, MD  fluticasone (FLONASE) 50 MCG/ACT nasal spray Place into both nostrils daily.   Yes Historical Provider, MD  gabapentin (NEURONTIN) 300 MG capsule Take 300 mg by mouth 3 (  three) times daily.   Yes Historical Provider, MD  losartan (COZAAR) 100 MG tablet Take 100 mg by mouth daily.   Yes Historical Provider, MD  metformin (FORTAMET) 1000 MG (OSM) 24 hr tablet Take 1,000 mg by mouth 2 (two) times daily with a meal.   Yes Historical Provider, MD  metoprolol succinate (TOPROL-XL) 25 MG 24 hr tablet Take 25 mg by mouth daily.   Yes Historical Provider, MD  pravastatin (PRAVACHOL) 40 MG tablet Take 40 mg by mouth daily.   Yes Historical Provider, MD    Family History No family history on  file.  Social History Social History  Substance Use Topics  . Smoking status: Never Smoker  . Smokeless tobacco: Never Used  . Alcohol use No     Allergies   Review of patient's allergies indicates not on file.   Review of Systems Review of Systems  Constitutional: Positive for chills and fatigue. Fever: 100.0 this am.  HENT: Positive for congestion. Negative for sore throat.   Respiratory: Negative for cough and shortness of breath.   Cardiovascular: Positive for leg swelling (today). Negative for chest pain.  Gastrointestinal: Positive for constipation. Negative for abdominal pain, blood in stool, diarrhea, nausea and vomiting.  Genitourinary: Negative for dysuria.  Musculoskeletal: Positive for arthralgias (hips), back pain, myalgias and neck pain.  Skin: Negative for rash.  Neurological: Positive for headaches (all day yesterday, thinks from hard bed yesterady). Negative for facial asymmetry, speech difficulty, weakness, light-headedness (yesteray with burping but not today) and numbness.     Physical Exam Updated Vital Signs BP (!) 116/47   Pulse 75   Temp 100.9 F (38.3 C) (Rectal)   Resp 16   Ht _0  (1.803 m)   Wt 205 lb (93 kg)   SpO2 90%   BMI 28.59 kg/m   Physical Exam  Constitutional: He is oriented to person, place, and time. He appears well-developed and well-nourished. No distress.  HENT:  Head: Normocephalic and atraumatic.  Mouth/Throat: Oropharynx is clear and moist. No oropharyngeal exudate.  Eyes: Conjunctivae and EOM are normal. Pupils are equal, round, and reactive to light.  Neck: Normal range of motion. JVD present.  Cardiovascular: Normal rate, regular rhythm, normal heart sounds and intact distal pulses.  Exam reveals no gallop and no friction rub.   No murmur heard. Pulmonary/Chest: Effort normal and breath sounds normal. No respiratory distress. He has no wheezes. He has no rales.  Abdominal: Soft. He exhibits no distension. There is  no tenderness. There is no guarding, no CVA tenderness and negative Murphy's sign.  Musculoskeletal: He exhibits edema (2+).       Cervical back: He exhibits no bony tenderness.       Thoracic back: He exhibits no bony tenderness.       Lumbar back: He exhibits no bony tenderness.  Neurological: He is alert and oriented to person, place, and time.  Skin: Skin is warm and dry. He is not diaphoretic.  Nursing note and vitals reviewed.    ED Treatments / Results  Labs (all labs ordered are listed, but only abnormal results are displayed) Labs Reviewed  CBC WITH DIFFERENTIAL/PLATELET - Abnormal; Notable for the following:       Result Value   WBC 13.8 (*)    RBC 3.95 (*)    Hemoglobin 10.7 (*)    HCT 31.4 (*)    RDW 16.3 (*)    Neutro Abs 10.1 (*)    Monocytes Absolute 1.7 (*)  All other components within normal limits  COMPREHENSIVE METABOLIC PANEL - Abnormal; Notable for the following:    Sodium 128 (*)    Chloride 97 (*)    Glucose, Bld 149 (*)    Creatinine, Ser 1.68 (*)    Calcium 8.6 (*)    ALT 15 (*)    GFR calc non Af Amer 37 (*)    GFR calc Af Amer 43 (*)    All other components within normal limits  BRAIN NATRIURETIC PEPTIDE - Abnormal; Notable for the following:    B Natriuretic Peptide 341.4 (*)    All other components within normal limits  CULTURE, BLOOD (ROUTINE X 2)  CULTURE, BLOOD (ROUTINE X 2)  URINALYSIS, ROUTINE W REFLEX MICROSCOPIC (NOT AT [facility])  LIPASE, BLOOD  TROPONIN I  INFLUENZA PANEL BY PCR (TYPE A & B, H1N1)  I-STAT CG4 LACTIC ACID, ED  I-STAT CG4 LACTIC ACID, ED    EKG  EKG Interpretation  Date/Time:  Wednesday September 08 2016 14:14:57 EDT Ventricular Rate:  72 PR Interval:    QRS Duration: 104 QT Interval:  396 QTC Calculation: 434 R Axis:     Text Interpretation:  Sinus rhythm Nonspecific ST changes, very  mild depresion in I, aVL in comparison to prior Confirmed by [facility] MD, Tahra Hitzeman ([id number]) on 09/08/2016 3:57:17 PM        Radiology Dg Chest 2 View  Result Date: 09/08/2016 CLINICAL DATA:  Weakness.  Pain all over. EXAM: CHEST  2 VIEW COMPARISON:  09/07/2016 FINDINGS: The heart size and mediastinal contours are within normal limits. Both lungs are clear. No pleural effusion or pneumothorax. The visualized skeletal structures are unremarkable. IMPRESSION: No active cardiopulmonary disease. Electronically Signed   By: Lajean Manes M.D.   On: 09/08/2016 14:10    Procedures Procedures (including critical care time)  Medications Ordered in ED Medications  oseltamivir (TAMIFLU) capsule 75 mg (75 mg Oral Given 09/08/16 1624)     Initial Impression / Assessment and Plan / ED Course  I have reviewed the triage vital signs and the nursing notes.  Pertinent labs & imaging results that were available during my care of the patient were reviewed by me and considered in my medical decision making (see chart for details).  Clinical Course   80 year old male with a history of atrial fibrillation, coronary artery disease, hypertension, hyperlipidemia, diabetes presents with concern for generalized weakness, bodyaches, nasal congestion, leg swelling and elevated temperature and hypoxia.  Patient and family report that he had generalized weakness and abdominal pain and presented yesterday to the [facility] emergency department, where a CT of the abdomen and pelvis was obtained which showed no abnormalities.  Regarding fever, patient has no sign of UTI, no sign of pneumonia. Given diffuse body aches and nasal congestion, feel fluids high possibility. Ordered influenza PCR, and gave Tamiflu. We'll draw blood cultures, however at this time do not feel that empiric antibiotics are indicated, with suspected viral source, and a patient who is hemodynamically stable with normal lactic acid.   Other possible etiology of fever is pulmonary embolus, and if influenza PCR is negative, would consider V/Q testing as an  inpatient. Patient has anaphylaxis to IV contrast.  Labs show in comparison to prior one year ago, hyponatremia of 128, acute kidney injury.  His BNP is in the 300s, with no prior. By history, I would expect the patient to be volume down and dehydrated as etiology of his hyponatremia and elevated Cr (or chronic disease)  however, he has edema and JVD, and hypoxia, borderline BNP, and CHF is on differential as well.  Will hold on fluids unless patient becomes hemodynamically unstable. Attempted [place] but unable to obtain reliable images. Will check weight.  Discussed with Dr. Posey Pronto, hospitalist, will admit for hypoxia to 86% on room air, now on 2L pNC, with possible etiologies including influenza, pulmonary embolus, CHF, sepsis.       Final Clinical Impressions(s) / ED Diagnoses   Final diagnoses:  Influenza-like illness  Hypoxia  Hyponatremia  Elevated serum creatinine    New Prescriptions New Prescriptions   No medications on file     Gareth Morgan, MD 09/08/16 1655

## 2016-09-08 NOTE — ED Notes (Signed)
Patient transported to X-ray 

## 2016-09-08 NOTE — ED Triage Notes (Addendum)
Pt c/o "pain all over-swelling to eyes"-states he was seen at [facility] ED yesterday-refused admn per pt and brother-both are unsure of dx/need for admn from [facility] yesterday- pt NAD-presents to triage to w/c

## 2016-09-09 ENCOUNTER — Inpatient Hospital Stay (HOSPITAL_COMMUNITY): Payer: Medicare Other

## 2016-09-09 ENCOUNTER — Inpatient Hospital Stay (HOSPITAL_BASED_OUTPATIENT_CLINIC_OR_DEPARTMENT_OTHER): Payer: Medicare Other

## 2016-09-09 DIAGNOSIS — I10 Essential (primary) hypertension: Secondary | ICD-10-CM | POA: Diagnosis not present

## 2016-09-09 DIAGNOSIS — R609 Edema, unspecified: Secondary | ICD-10-CM

## 2016-09-09 DIAGNOSIS — E871 Hypo-osmolality and hyponatremia: Secondary | ICD-10-CM | POA: Diagnosis not present

## 2016-09-09 DIAGNOSIS — R1011 Right upper quadrant pain: Secondary | ICD-10-CM

## 2016-09-09 DIAGNOSIS — I480 Paroxysmal atrial fibrillation: Secondary | ICD-10-CM | POA: Diagnosis not present

## 2016-09-09 DIAGNOSIS — J9601 Acute respiratory failure with hypoxia: Principal | ICD-10-CM

## 2016-09-09 DIAGNOSIS — E1142 Type 2 diabetes mellitus with diabetic polyneuropathy: Secondary | ICD-10-CM

## 2016-09-09 DIAGNOSIS — N179 Acute kidney failure, unspecified: Secondary | ICD-10-CM

## 2016-09-09 LAB — GLUCOSE, CAPILLARY
GLUCOSE-CAPILLARY: 163 mg/dL — AB (ref 65–99)
GLUCOSE-CAPILLARY: 136 mg/dL — AB (ref 65–99)
GLUCOSE-CAPILLARY: 170 mg/dL — AB (ref 65–99)
Glucose-Capillary: 140 mg/dL — ABNORMAL HIGH (ref 65–99)

## 2016-09-09 LAB — BASIC METABOLIC PANEL
Anion gap: 7 (ref 5–15)
BUN: 15 mg/dL (ref 6–20)
CALCIUM: 8.6 mg/dL — AB (ref 8.9–10.3)
CO2: 26 mmol/L (ref 22–32)
CREATININE: 1.61 mg/dL — AB (ref 0.61–1.24)
Chloride: 97 mmol/L — ABNORMAL LOW (ref 101–111)
GFR calc non Af Amer: 39 mL/min — ABNORMAL LOW (ref >60)
GFR, EST AFRICAN AMERICAN: 45 mL/min — AB (ref >60)
Glucose, Bld: 146 mg/dL — ABNORMAL HIGH (ref 65–99)
Potassium: 4.4 mmol/L (ref 3.5–5.1)
SODIUM: 130 mmol/L — AB (ref 135–145)

## 2016-09-09 LAB — CBC
HCT: 30.8 % — ABNORMAL LOW (ref 39.0–52.0)
Hemoglobin: 10.1 g/dL — ABNORMAL LOW (ref 13.0–17.0)
MCH: 26 pg (ref 26.0–34.0)
MCHC: 32.8 g/dL (ref 30.0–36.0)
MCV: 79.2 fL (ref 78.0–100.0)
PLATELETS: 172 10*3/uL (ref 150–400)
RBC: 3.89 MIL/uL — AB (ref 4.22–5.81)
RDW: 16.4 % — ABNORMAL HIGH (ref 11.5–15.5)
WBC: 12.6 10*3/uL — ABNORMAL HIGH (ref 4.0–10.5)

## 2016-09-09 LAB — HEPATIC FUNCTION PANEL
ALT: 12 U/L — AB (ref 17–63)
AST: 24 U/L (ref 15–41)
Albumin: 3.3 g/dL — ABNORMAL LOW (ref 3.5–5.0)
Alkaline Phosphatase: 64 U/L (ref 38–126)
BILIRUBIN INDIRECT: 0.6 mg/dL (ref 0.3–0.9)
Bilirubin, Direct: 0.2 mg/dL (ref 0.1–0.5)
TOTAL PROTEIN: 6.6 g/dL (ref 6.5–8.1)
Total Bilirubin: 0.8 mg/dL (ref 0.3–1.2)

## 2016-09-09 LAB — TROPONIN I: Troponin I: <0.03 ng/mL (ref <0.03)

## 2016-09-09 IMAGING — NM NM PULMONARY VENT & PERF
13 series · 13 of 13 positions shown · non-contrast
Comparison: Chest radiographs [DATE]

CLINICAL DATA: Shortness of breath.  Lower extremity swelling.

EXAM:
NUCLEAR MEDICINE VENTILATION - PERFUSION LUNG SCAN
TECHNIQUE: Ventilation images were obtained in multiple projections using
inhaled aerosol [QE] DTPA. Perfusion images were obtained in
multiple projections after intravenous injection of [QE] MAA.
RADIOPHARMACEUTICALS:  31.2 mCi [QE] DTPA aerosol
inhalation and 4.2 mCi [QE] MAA IV

[Series 2: lao/rpo vent · 4.14mm/px · 1 of 1 slices shown]
[im 1/1]
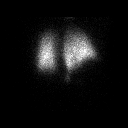

[Series 3: lpo/rao vent · 4.14mm/px · 1 of 1 slices shown (1 of 2)]
[im 1/1]
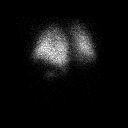

[Series 3: lpo/rao vent · 4.14mm/px · 1 of 1 slices shown (2 of 2)]
[im 1/1]
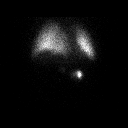

[Series 4: lt lat/rt lat vent · 4.14mm/px · 1 of 1 slices shown (1 of 2)]
[im 1/1]
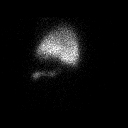

[Series 4: lt lat/rt lat vent · 4.14mm/px · 1 of 1 slices shown (2 of 2)]
[im 1/1]
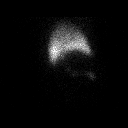

[Series 5: lt lat/rt lat perf · 4.14mm/px · 1 of 1 slices shown (1 of 2)]
[im 1/1]
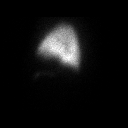

[Series 5: lt lat/rt lat perf · 4.14mm/px · 1 of 1 slices shown (2 of 2)]
[im 1/1]
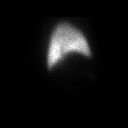

[Series 6: lpo/rao perf · 4.14mm/px · 1 of 1 slices shown (1 of 2)]
[im 1/1]
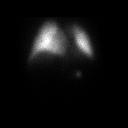

[Series 6: lpo/rao perf · 4.14mm/px · 1 of 1 slices shown (2 of 2)]
[im 1/1]
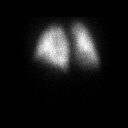

[Series 7: ant/post perf · 4.14mm/px · 1 of 1 slices shown (1 of 2)]
[im 1/1]
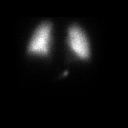

[Series 7: ant/post perf · 4.14mm/px · 1 of 1 slices shown (2 of 2)]
[im 1/1]
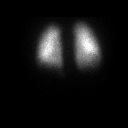

[Series 8: lao/rpo perf · 4.14mm/px · 1 of 1 slices shown (1 of 2)]
[im 1/1]
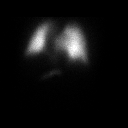

[Series 8: lao/rpo perf · 4.14mm/px · 1 of 1 slices shown (2 of 2)]
[im 1/1]
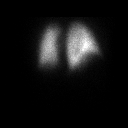

[13 of 13 positions shown; findings below may reference images not displayed]

FINDINGS: Ventilation: No focal ventilation defect.

Perfusion: No wedge shaped peripheral perfusion defects to suggest
acute pulmonary embolism.
IMPRESSION: Negative.

## 2016-09-09 MED ORDER — CEFTRIAXONE SODIUM 2 G IJ SOLR
2.0000 g | INTRAMUSCULAR | Status: DC
  Filled 2016-09-09: qty 2

## 2016-09-09 MED ORDER — TECHNETIUM TO 99M ALBUMIN AGGREGATED
4.2000 | Freq: Once | INTRAVENOUS | Status: AC | PRN
  Administered 2016-09-09: 4.2 via INTRAVENOUS

## 2016-09-09 MED ORDER — TECHNETIUM TC 99M DIETHYLENETRIAME-PENTAACETIC ACID: 31.2000 | Freq: Once | INTRAVENOUS | Status: DC | PRN

## 2016-09-09 NOTE — Progress Notes (Signed)
Pt refused bed alarm, Rn made pt aware of night safety precautions, will continue to monitor

## 2016-09-09 NOTE — Progress Notes (Signed)
Patient ambulated well in hall with minimal assistance.

## 2016-09-09 NOTE — Progress Notes (Signed)
PROGRESS NOTE    Duane [name]  [id number] DOB: Apr 24, 1935 DOA: 09/08/2016 PCP: [name], MD     Brief Narrative:  Duane [name] is a 81 y.o. male with history of paroxysmal atrial fibrillation, hypertension, chronic kidney disease, diabetes mellitus2 presents to the ER because of not feeling well over the last few days. Patient states he has been feeling weak and generalized body aches. Patient had gone to the ER at [facility] day before and had CT abdomen and pelvis which as per the ER physician has been unremarkable. Since patient has been having persistent symptoms patient was brought to the [facility]. At [facility] patient was found to be mildly febrile and hypoxic. Chest x-ray was unremarkable BNP was mildly elevated. Influenza PCR was done which was negative. Patient also has recently noticed increasing swelling in the lower extremities and also was recently started on amlodipine for blood pressure. Patient is being admitted for further management.  Assessment & Plan:   Principal Problem:   Respiratory failure with hypoxia (HCC) Active Problems:   Abdominal pain, right upper quadrant   Essential hypertension   PAF (paroxysmal atrial fibrillation) (HCC)   Diabetes mellitus type 2, controlled (HCC)   ARF (acute renal failure) (HCC)   Normochromic normocytic anemia   Abdominal pain  RUQ abdominal pain -[place] RUQ: dilated gallbladder with sludge and wall thickening. May represent acalculous cholecystitis. However, patient's symptoms have resolved on my exam. He is tolerating diet and LFT are normal. No further abdominal pain and exam is completely benign. Will defer general surgery consult at this time, unless pain recurs.    Acute hypoxemic respiratory failure -Now on room air -Influenza PCR negative, CXR negative  -VQ and Doppler LE negative  -Received 1 dose tamiflu in ED   Paroxysmal A Fib -CHADSVASC 4. Follows with [facility] (Dr. Wyline Copas). Unclear why he is not on anticoagulant, but currently on aspirin, plavix. Continue follow up with cardiology outpatient.  -Amiodarone, metoprolol   AKI on CKD stage 3 -Baseline Cr 1.2-1.3 in 2016 -UA negative  -IVF -Trend BMP   Essential HTN -Well controlled -Metoprolol, Norvasc   DM type 2, with CKD stage 3, with neuropathy  -Ha1c 7.2  -SSI   HLD -Pravachol   Dementia -Aricept    DVT prophylaxis: SCDs Code Status: full Family Communication: no family at bedside Disposition Plan: home with renal improvement   Consultants:   None  Procedures:   None  Antimicrobials:  Anti-infectives    Start     Dose/Rate Route Frequency Ordered Stop   09/09/16 2200  cefTRIAXone (ROCEPHIN) 2 g in dextrose 5 % 50 mL IVPB  Status:  Discontinued     2 g 100 mL/hr over 30 Minutes Intravenous Every 24 hours 09/09/16 1458 09/09/16 1458   09/08/16 2300  cefTRIAXone (ROCEPHIN) 2 g in dextrose 5 % 50 mL IVPB  Status:  Discontinued     2 g 100 mL/hr over 30 Minutes Intravenous Every 24 hours 09/08/16 2143 09/09/16 1456   09/08/16 1545  oseltamivir (TAMIFLU) capsule 75 mg     75 mg Oral  Once 09/08/16 1534 09/08/16 1624       Subjective: Patient states that all of his symptoms have now resolved. He denies any fevers, chills, chest pain or shortness of breath. He states that he had sharp right upper quadrant abdominal pain that has been intermittent in nature. No abdominal pain today. No nausea,  no vomiting, no other abdominal pain. He feels back to baseline.  Objective: Vitals:   09/08/16 1800 09/08/16 1932 09/09/16 0453 09/09/16 0900  BP: (!) 136/53 (!) 134/51 (!) 129/49 (!) 146/53  Pulse: 76 72 74 75  Resp: 18 20 18 18   Temp:  97.8 F (36.6 C) 98.2 F (36.8 C) 98.2 F (36.8 C)  TempSrc:  Oral Oral Oral  SpO2: 94% 96% 90% 97%  Weight:  95.9 kg (211 lb 6.4 oz)    Height:        Intake/Output Summary (Last 24 hours) at 09/09/16 1506 Last data  filed at 09/09/16 1400  Gross per 24 hour  Intake          1168.75 ml  Output             1495 ml  Net          -326.25 ml   Filed Weights   09/08/16 1230 09/08/16 1932  Weight: 93 kg (205 lb) 95.9 kg (211 lb 6.4 oz)    Examination:  General exam: Appears calm and comfortable  Respiratory system: Clear to auscultation. Respiratory effort normal. Cardiovascular system: S1 & S2 heard, RRR. No JVD, murmurs, rubs, gallops or clicks. No pedal edema. Gastrointestinal system: Abdomen is nondistended, soft and nontender. No organomegaly or masses felt. Normal bowel sounds heard. Benign abdominal exam.  Central nervous system: Alert and oriented. No focal neurological deficits. Extremities: Symmetric 5 x 5 power. Skin: No rashes, lesions or ulcers Psychiatry: Judgement and insight appear normal. Mood & affect appropriate.   Data Reviewed: I have personally reviewed following labs and imaging studies  CBC:  Recent Labs Lab 09/08/16 1405 09/09/16 0318  WBC 13.8* 12.6*  NEUTROABS 10.1*  --   HGB 10.7* 10.1*  HCT 31.4* 30.8*  MCV 79.5 79.2  PLT 171 [id number]   Basic Metabolic Panel:  Recent Labs Lab 09/08/16 1405 09/09/16 0318  NA 128* 130*  K 4.3 4.4  CL 97* 97*  CO2 23 26  GLUCOSE 149* 146*  BUN 17 15  CREATININE 1.68* 1.61*  CALCIUM 8.6* 8.6*   GFR: Estimated Creatinine Clearance: 43.9 mL/min (by C-G formula based on SCr of 1.61 mg/dL (H)). Liver Function Tests:  Recent Labs Lab 09/08/16 1405 09/09/16 0318  AST 23 24  ALT 15* 12*  ALKPHOS 61 64  BILITOT 0.9 0.8  PROT 7.1 6.6  ALBUMIN 3.8 3.3*    Recent Labs Lab 09/08/16 1405  LIPASE 27   No results for input(s): AMMONIA in the last 168 hours. Coagulation Profile: No results for input(s): INR, PROTIME in the last 168 hours. Cardiac Enzymes:  Recent Labs Lab 09/08/16 1405 09/08/16 2202 09/09/16 0318 09/09/16 1120  TROPONINI <0.03 <0.03 <0.03 <0.03   BNP (last 3 results) No results for input(s):  PROBNP in the last 8760 hours. HbA1C: No results for input(s): HGBA1C in the last 72 hours. CBG:  Recent Labs Lab 09/08/16 1936 09/09/16 0807 09/09/16 1142  GLUCAP 145* 163* 136*   Lipid Profile: No results for input(s): CHOL, HDL, LDLCALC, TRIG, CHOLHDL, LDLDIRECT in the last 72 hours. Thyroid Function Tests: No results for input(s): TSH, T4TOTAL, FREET4, T3FREE, THYROIDAB in the last 72 hours. Anemia Panel: No results for input(s): VITAMINB12, FOLATE, FERRITIN, TIBC, IRON, RETICCTPCT in the last 72 hours. Sepsis Labs:  Recent Labs Lab 09/08/16 1429  LATICACIDVEN 1.49    Recent Results (from the past 240 hour(s))  Blood culture (routine x 2)     Status: None (  Preliminary result)   Collection Time: 09/08/16  4:41 PM  Result Value Ref Range Status   Specimen Description   Final    BLOOD RIGHT ANTECUBITAL Performed at [facility]    Special Requests BOTTLES DRAWN AEROBIC AND ANAEROBIC 5CC  Final   Culture PENDING  Incomplete   Report Status PENDING  Incomplete  Blood culture (routine x 2)     Status: None (Preliminary result)   Collection Time: 09/08/16  4:55 PM  Result Value Ref Range Status   Specimen Description   Final    BLOOD RIGHT HAND Performed at [facility]    Special Requests BOTTLES DRAWN AEROBIC AND ANAEROBIC 5CC  Final   Culture PENDING  Incomplete   Report Status PENDING  Incomplete       Radiology Studies: Dg Chest 2 View  Result Date: 09/08/2016 CLINICAL DATA:  Weakness.  Pain all over. EXAM: CHEST  2 VIEW COMPARISON:  09/07/2016 FINDINGS: The heart size and mediastinal contours are within normal limits. Both lungs are clear. No pleural effusion or pneumothorax. The visualized skeletal structures are unremarkable. IMPRESSION: No active cardiopulmonary disease. Electronically Signed   By: Lajean Manes M.D.   On: 09/08/2016 14:10   US Abdomen Complete  Result Date: 09/09/2016 CLINICAL DATA:  Right upper quadrant pain EXAM:  ABDOMEN ULTRASOUND COMPLETE COMPARISON:  09/07/2016 FINDINGS: Gallbladder: Gallbladder is well distended and demonstrates some wall thickening and a 6 mm. Gallbladder sludge is seen. No definitive cholelithiasis is noted. Common bile duct: Diameter: 4.2 mm. Liver: No focal lesion identified. Within normal limits in parenchymal echogenicity. IVC: No abnormality visualized. Pancreas: Not well seen due to overlying bowel gas. Spleen: Size and appearance within normal limits. Right Kidney: Length: 12.4 cm. Echogenicity within normal limits. No mass or hydronephrosis visualized. Left Kidney: Length: 12.3 cm. Echogenicity within normal limits. No mass or hydronephrosis visualized. Abdominal aorta: No aneurysm visualized. Other findings: None. IMPRESSION: Dilated gallbladder with sludge and wall thickening. In the appropriate clinical setting these changes may represent acalculous cholecystitis. HIDA scan may be helpful for further evaluation. No other focal abnormality is seen. Electronically Signed   By: Inez Catalina M.D.   On: 09/09/2016 10:52   Nm Pulmonary Perf And Vent  Result Date: 09/09/2016 CLINICAL DATA:  Shortness of breath.  Lower extremity swelling. EXAM: NUCLEAR MEDICINE VENTILATION - PERFUSION LUNG SCAN TECHNIQUE: Ventilation images were obtained in multiple projections using inhaled aerosol Tc-8m DTPA. Perfusion images were obtained in multiple projections after intravenous injection of Tc-47m MAA. RADIOPHARMACEUTICALS:  31.2 mCi Technetium-25m DTPA aerosol inhalation and 4.2 mCi Technetium-11m MAA IV COMPARISON:  Chest radiographs 09/08/2016 FINDINGS: Ventilation: No focal ventilation defect. Perfusion: No wedge shaped peripheral perfusion defects to suggest acute pulmonary embolism. IMPRESSION: Negative. Electronically Signed   By: Logan Bores M.D.   On: 09/09/2016 10:12      Scheduled Meds: . amiodarone  200 mg Oral Daily  . amLODipine  5 mg Oral Daily  . aspirin  81 mg Oral Daily  .  donepezil  10 mg Oral QHS  . fluticasone  2 spray Each Nare Daily  . gabapentin  300 mg Oral TID  . insulin aspart  0-9 Units Subcutaneous TID WC  . metoprolol succinate  25 mg Oral Daily  . pravastatin  40 mg Oral q1800   Continuous Infusions: . sodium chloride 75 mL/hr at 09/09/16 1215     LOS: 1 day    Time spent: 40 minutes   Dessa Phi, DO [facility] [email or web address] Password [email or web address] 09/09/2016, 3:06 PM

## 2016-09-09 NOTE — Progress Notes (Signed)
*  PRELIMINARY RESULTS* Vascular Ultrasound Bilateral lower extremity duplex has been completed.  Preliminary findings: No evidence of deep vein thrombosis or baker's cysts   Everrett Coombe 09/09/2016, 11:47 AM

## 2016-09-10 DIAGNOSIS — R1011 Right upper quadrant pain: Secondary | ICD-10-CM | POA: Diagnosis not present

## 2016-09-10 DIAGNOSIS — E871 Hypo-osmolality and hyponatremia: Secondary | ICD-10-CM | POA: Diagnosis not present

## 2016-09-10 DIAGNOSIS — E1142 Type 2 diabetes mellitus with diabetic polyneuropathy: Secondary | ICD-10-CM | POA: Diagnosis not present

## 2016-09-10 DIAGNOSIS — N179 Acute kidney failure, unspecified: Secondary | ICD-10-CM | POA: Diagnosis not present

## 2016-09-10 DIAGNOSIS — I480 Paroxysmal atrial fibrillation: Secondary | ICD-10-CM | POA: Diagnosis not present

## 2016-09-10 DIAGNOSIS — J9601 Acute respiratory failure with hypoxia: Secondary | ICD-10-CM | POA: Diagnosis not present

## 2016-09-10 LAB — GLUCOSE, CAPILLARY
Glucose-Capillary: 155 mg/dL — ABNORMAL HIGH (ref 65–99)
Glucose-Capillary: 195 mg/dL — ABNORMAL HIGH (ref 65–99)

## 2016-09-10 LAB — BASIC METABOLIC PANEL
ANION GAP: 9 (ref 5–15)
BUN: 13 mg/dL (ref 6–20)
CALCIUM: 8.7 mg/dL — AB (ref 8.9–10.3)
CO2: 24 mmol/L (ref 22–32)
CREATININE: 1.45 mg/dL — AB (ref 0.61–1.24)
Chloride: 100 mmol/L — ABNORMAL LOW (ref 101–111)
GFR, EST AFRICAN AMERICAN: 51 mL/min — AB (ref >60)
GFR, EST NON AFRICAN AMERICAN: 44 mL/min — AB (ref >60)
Glucose, Bld: 165 mg/dL — ABNORMAL HIGH (ref 65–99)
Potassium: 4.5 mmol/L (ref 3.5–5.1)
SODIUM: 133 mmol/L — AB (ref 135–145)

## 2016-09-10 MED ORDER — AMLODIPINE BESYLATE 5 MG PO TABS: 5.0000 mg | ORAL_TABLET | Freq: Every day | ORAL | 0 refills | Status: DC

## 2016-09-10 MED ORDER — AMLODIPINE BESYLATE 5 MG PO TABS: 5.0000 mg | ORAL_TABLET | Freq: Every day | ORAL | Status: DC

## 2016-09-10 NOTE — Final Progress Note (Signed)
Patient discharge teaching given, including activity, diet, follow-up appoints, and medications. Patient verbalized understanding of all discharge instructions. IV access was d/c'd. Vitals are stable. Skin is intact except as charted in most recent assessments. Pt to be escorted out by NT, to be driven home by family. 

## 2016-09-10 NOTE — Discharge Summary (Signed)
Physician Discharge Summary  Duane [name] [id number] DOB: [birth year] DOA: 09/08/2016  PCP: [name], MD  Admit date: 09/08/2016 Discharge date: 09/10/2016  Admitted From: Home Disposition:  Home  Recommendations for Outpatient Follow-up:  1. Follow up with PCP in 1-2 weeks 2. Repeat BMP in 1 week  3. May need outpatient surgical evaluation if persistent RUQ abdominal pain, persistent nausea/vomiting. RUQ US showed dilated gallbladder with sludge and thickening, representing acalculous cholecystitis. However, all symptoms had resolved in hospital, abdominal exam benign, no nausea or vomiting, tolerating all meals.   Home Health: No  Equipment/Devices: None   Discharge Condition: Stable CODE STATUS: Full  Diet recommendation: Heart healthy, carb modified   Brief/Interim Summary: From H&P: Duane [name] a 81 y.o.malewith history of paroxysmal atrial fibrillation, hypertension, chronic kidney disease, diabetes mellitus2presents to the ER because of not feeling well over the last few days. Patient states he has been feeling weak and generalized body aches. Patient had gone to the ER at [facility] day before and had CT abdomen and pelvis which as per the ER physician has been unremarkable. Since patient has been having persistent symptoms patient was brought to the [facility].At [facility] [place] patient was found to be mildly febrile and hypoxic. Chest x-ray was unremarkable BNP was mildly elevated. Influenza PCR was done which was negative. Patient also has recently noticed increasing swelling in the lower extremities and also was recently started on amlodipine for blood pressure. Patient is being admitted for further management.  Interim: Patient was admitted for right upper quadrant abdominal pain which was evaluated with ultrasound. It showed dilated gallbladder with sludge and wall thickening. May represent acalculous cholecystitis.  However, patient's symptoms have resolved on my exam. He is tolerating diet and LFT are normal. No further abdominal pain and exam is completely benign. This finding was discussed with patient. We'll defer any surgical intervention or evaluation at this point with benign abdominal exam as well as resolved symptoms. It can be reevaluated as outpatient if symptom returns.  VQ scan as well as venous Dopplers were ordered to rule out venous thromboembolism. These are both negative. His shortness of breath had completely resolved at the time of admission. Swelling also improved.  Given IV fluids for AKI, and creatinine was trending down at the time of discharge. Cozaar will be restarted at discharge and he should have repeat BMP with follow-up with PCP.   Discharge Diagnoses:  Principal Problem:   Respiratory failure with hypoxia ([place]) Active Problems:   Abdominal pain, right upper quadrant   Essential hypertension   PAF (paroxysmal atrial fibrillation) (HCC)   Diabetes mellitus type 2, controlled ([place])   ARF (acute renal failure) (HCC)   Normochromic normocytic anemia   Abdominal pain   RUQ abdominal pain -[place] RUQ: dilated gallbladder with sludge and wall thickening. May represent acalculous cholecystitis. However, patient's symptoms have resolved on my exam. He is tolerating diet and LFT are normal. No further abdominal pain and exam is completely benign. Will defer general surgery consult at this time, unless pain recurs.    Acute hypoxemic respiratory failure -Now on room air, resolved  -Influenza PCR negative, CXR negative  -VQ and Doppler LE negative  -Received 1 dose tamiflu in ED   Paroxysmal A Fib -CHADSVASC 4. Follows with [facility] (Dr. Wyline Copas). Unclear why he is not on anticoagulant, but currently on aspirin, plavix. Continue follow up with cardiology outpatient.  -Amiodarone, metoprolol  AKI on CKD stage 3 - resolved  -Baseline Cr 1.2-1.3 in 2016 -UA negative   -IVF -Trend BMP  -Restart cozaar as outpatient   Essential HTN -Well controlled -Metoprolol, Norvasc   DM type 2, with CKD stage 3, with neuropathy  -Ha1c 7.2  -SSI   HLD -Pravachol   Dementia -Aricept    Discharge Instructions  Discharge Instructions    Call MD for:  persistant nausea and vomiting    Complete by:  As directed    Call MD for:  severe uncontrolled pain    Complete by:  As directed    Diet - low sodium heart healthy    Complete by:  As directed    Increase activity slowly    Complete by:  As directed        Medication List    TAKE these medications   amiodarone 200 MG tablet Commonly known as:  PACERONE Take 200 mg by mouth daily.   amLODipine 5 MG tablet Commonly known as:  NORVASC Take 1 tablet (5 mg total) by mouth daily. What changed:  medication strength  how much to take   aspirin EC 81 MG tablet Take 81 mg by mouth every other day. On even numbered days   b complex vitamins tablet Take 1 tablet by mouth daily.   calcium-vitamin D 500-200 MG-UNIT tablet Take 1 tablet by mouth daily.   clopidogrel 75 MG tablet Commonly known as:  PLAVIX Take 75 mg by mouth daily.   donepezil 10 MG tablet Commonly known as:  ARICEPT Take 10 mg by mouth at bedtime.   EPIPEN 2-PAK 0.3 mg/0.3 mL Soaj injection Generic drug:  EPINEPHrine Inject 0.3 mg into the muscle once as needed (severe allergic reaction).   fluticasone 50 MCG/ACT nasal spray Commonly known as:  FLONASE Place 1 spray into both nostrils daily as needed (congestion).   losartan 100 MG tablet Commonly known as:  COZAAR Take 100 mg by mouth daily.   metFORMIN 1000 MG tablet Commonly known as:  GLUCOPHAGE Take 1,000 mg by mouth 2 (two) times daily with a meal.   metoprolol succinate 25 MG 24 hr tablet Commonly known as:  TOPROL-XL Take 25 mg by mouth daily.   Omega-3-6-9 Caps Take 1 capsule by mouth 2 (two) times daily.   pravastatin 40 MG tablet Commonly  known as:  PRAVACHOL Take 40 mg by mouth every evening.   PROBIOTIC PO Take 1 tablet by mouth daily.   SAW PALMETTO-ZINC PO Take 2 tablets by mouth 2 (two) times daily.      Follow-up Information    [name], MD. Schedule an appointment as soon as possible for a visit in 1 week(s).   Specialty:  Internal Medicine Contact information: [address] [place] [place] [address] [phone]          Allergies  Allergen Reactions  . Bee Venom Anaphylaxis  . Contrast Media [Iodinated Diagnostic Agents] Anaphylaxis  . Atorvastatin Other (See Comments)    Aching in legs  . Lisinopril Cough    Consultations:  None   Procedures/Studies: Dg Chest 2 View  Result Date: 09/08/2016 CLINICAL DATA:  Weakness.  Pain all over. EXAM: CHEST  2 VIEW COMPARISON:  09/07/2016 FINDINGS: The heart size and mediastinal contours are within normal limits. Both lungs are clear. No pleural effusion or pneumothorax. The visualized skeletal structures are unremarkable. IMPRESSION: No active cardiopulmonary disease. Electronically Signed   By: Lajean Manes M.D.   On: 09/08/2016 14:10  US Abdomen Complete  Result Date: 09/09/2016 CLINICAL DATA:  Right upper quadrant pain EXAM: ABDOMEN ULTRASOUND COMPLETE COMPARISON:  09/07/2016 FINDINGS: Gallbladder: Gallbladder is well distended and demonstrates some wall thickening and a 6 mm. Gallbladder sludge is seen. No definitive cholelithiasis is noted. Common bile duct: Diameter: 4.2 mm. Liver: No focal lesion identified. Within normal limits in parenchymal echogenicity. IVC: No abnormality visualized. Pancreas: Not well seen due to overlying bowel gas. Spleen: Size and appearance within normal limits. Right Kidney: Length: 12.4 cm. Echogenicity within normal limits. No mass or hydronephrosis visualized. Left Kidney: Length: 12.3 cm. Echogenicity within normal limits. No mass or hydronephrosis visualized. Abdominal aorta: No aneurysm  visualized. Other findings: None. IMPRESSION: Dilated gallbladder with sludge and wall thickening. In the appropriate clinical setting these changes may represent acalculous cholecystitis. HIDA scan may be helpful for further evaluation. No other focal abnormality is seen. Electronically Signed   By: Inez Catalina M.D.   On: 09/09/2016 10:52   Nm Pulmonary Perf And Vent  Result Date: 09/09/2016 CLINICAL DATA:  Shortness of breath.  Lower extremity swelling. EXAM: NUCLEAR MEDICINE VENTILATION - PERFUSION LUNG SCAN TECHNIQUE: Ventilation images were obtained in multiple projections using inhaled aerosol Tc-38m DTPA. Perfusion images were obtained in multiple projections after intravenous injection of Tc-14m MAA. RADIOPHARMACEUTICALS:  31.2 mCi Technetium-37m DTPA aerosol inhalation and 4.2 mCi Technetium-52m MAA IV COMPARISON:  Chest radiographs 09/08/2016 FINDINGS: Ventilation: No focal ventilation defect. Perfusion: No wedge shaped peripheral perfusion defects to suggest acute pulmonary embolism. IMPRESSION: Negative. Electronically Signed   By: Logan Bores M.D.   On: 09/09/2016 10:12   Vascular Ultrasound Bilateral lower extremity duplex has been completed.  Preliminary findings: No evidence of deep vein thrombosis or baker's cysts  Subjective: Doing well today. No acute events overnight. Completely asymptomatic. Denies any fevers, chills, shortness of breath, chest pain, abdominal pain, nausea, vomiting, diarrhea or dysuria. He is ready to go home. Tolerating breakfast and states that it was one of the best breakfast that he has had.  Discharge Exam: Vitals:   09/10/16 0536 09/10/16 0852  BP: 124/61 (!) 138/44  Pulse: (!) 102 76  Resp: 18 17  Temp: 98 F (36.7 C) 98.8 F (37.1 C)   Vitals:   09/09/16 1715 09/09/16 2036 09/10/16 0536 09/10/16 0852  BP: (!) 135/53 (!) 126/39 124/61 (!) 138/44  Pulse: 73 70 (!) 102 76  Resp: 18 18 18 17   Temp: 98.7 F (37.1 C) 98.7 F (37.1 C) 98 F  (36.7 C) 98.8 F (37.1 C)  TempSrc: Oral Oral Oral Oral  SpO2: 90% 97% 95% 98%  Weight:  95.4 kg (210 lb 5.1 oz)    Height:        General exam: Appears calm and comfortable  Respiratory system: Clear to auscultation. Respiratory effort normal. Cardiovascular system: S1 & S2 heard, RRR. No JVD, murmurs, rubs, gallops or clicks. No pedal edema. Gastrointestinal system: Abdomen is nondistended, soft and nontender. No organomegaly or masses felt. Normal bowel sounds heard. Benign abdominal exam.  Central nervous system: Alert and oriented. No focal neurological deficits. Extremities: Symmetric 5 x 5 power. Skin: No rashes, lesions or ulcers Psychiatry: Judgement and insight appear normal. Mood & affect appropriate.     The results of significant diagnostics from this hospitalization (including imaging, microbiology, ancillary and laboratory) are listed below for reference.     Microbiology: Recent Results (from the past 240 hour(s))  Blood culture (routine x 2)     Status: None (Preliminary result)  Collection Time: 09/08/16  4:41 PM  Result Value Ref Range Status   Specimen Description BLOOD RIGHT ANTECUBITAL  Final   Special Requests BOTTLES DRAWN AEROBIC AND ANAEROBIC 5CC  Final   Culture   Final    NO GROWTH < 24 HOURS Performed at [facility]    Report Status PENDING  Incomplete  Blood culture (routine x 2)     Status: None (Preliminary result)   Collection Time: 09/08/16  4:55 PM  Result Value Ref Range Status   Specimen Description BLOOD RIGHT HAND  Final   Special Requests BOTTLES DRAWN AEROBIC AND ANAEROBIC 5CC  Final   Culture   Final    NO GROWTH < 24 HOURS Performed at [facility]    Report Status PENDING  Incomplete     Labs: BNP (last 3 results)  Recent Labs  09/08/16 1405  BNP [id number]*   Basic Metabolic Panel:  Recent Labs Lab 09/08/16 1405 09/09/16 0318 09/10/16 0542  NA 128* 130* 133*  K 4.3 4.4 4.5  CL 97* 97* 100*  CO2 23  26 24   GLUCOSE 149* 146* 165*  BUN 17 15 13   CREATININE 1.68* 1.61* 1.45*  CALCIUM 8.6* 8.6* 8.7*   Liver Function Tests:  Recent Labs Lab 09/08/16 1405 09/09/16 0318  AST 23 24  ALT 15* 12*  ALKPHOS 61 64  BILITOT 0.9 0.8  PROT 7.1 6.6  ALBUMIN 3.8 3.3*    Recent Labs Lab 09/08/16 1405  LIPASE 27   No results for input(s): AMMONIA in the last 168 hours. CBC:  Recent Labs Lab 09/08/16 1405 09/09/16 0318  WBC 13.8* 12.6*  NEUTROABS 10.1*  --   HGB 10.7* 10.1*  HCT 31.4* 30.8*  MCV 79.5 79.2  PLT 171 172   Cardiac Enzymes:  Recent Labs Lab 09/08/16 1405 09/08/16 2202 09/09/16 0318 09/09/16 1120  TROPONINI <0.03 <0.03 <0.03 <0.03   BNP: Invalid input(s): POCBNP CBG:  Recent Labs Lab 09/09/16 0807 09/09/16 1142 09/09/16 1633 09/09/16 2031 09/10/16 0806  GLUCAP 163* 136* 170* 140* 155*   D-Dimer No results for input(s): DDIMER in the last 72 hours. Hgb A1c No results for input(s): HGBA1C in the last 72 hours. Lipid Profile No results for input(s): CHOL, HDL, LDLCALC, TRIG, CHOLHDL, LDLDIRECT in the last 72 hours. Thyroid function studies No results for input(s): TSH, T4TOTAL, T3FREE, THYROIDAB in the last 72 hours.  Invalid input(s): FREET3 Anemia work up No results for input(s): VITAMINB12, FOLATE, FERRITIN, TIBC, IRON, RETICCTPCT in the last 72 hours. Urinalysis    Component Value Date/Time   COLORURINE YELLOW 09/08/2016 1450   APPEARANCEUR CLEAR 09/08/2016 1450   LABSPEC 1.010 09/08/2016 1450   PHURINE 7.5 09/08/2016 1450   GLUCOSEU NEGATIVE 09/08/2016 1450   HGBUR NEGATIVE 09/08/2016 1450   BILIRUBINUR NEGATIVE 09/08/2016 1450   KETONESUR NEGATIVE 09/08/2016 1450   PROTEINUR NEGATIVE 09/08/2016 1450   NITRITE NEGATIVE 09/08/2016 1450   LEUKOCYTESUR NEGATIVE 09/08/2016 1450   Sepsis Labs Invalid input(s): PROCALCITONIN,  WBC,  LACTICIDVEN Microbiology Recent Results (from the past 240 hour(s))  Blood culture (routine x 2)      Status: None (Preliminary result)   Collection Time: 09/08/16  4:41 PM  Result Value Ref Range Status   Specimen Description BLOOD RIGHT ANTECUBITAL  Final   Special Requests BOTTLES DRAWN AEROBIC AND ANAEROBIC 5CC  Final   Culture   Final    NO GROWTH < 24 HOURS Performed at [facility]    Report  Status PENDING  Incomplete  Blood culture (routine x 2)     Status: None (Preliminary result)   Collection Time: 09/08/16  4:55 PM  Result Value Ref Range Status   Specimen Description BLOOD RIGHT HAND  Final   Special Requests BOTTLES DRAWN AEROBIC AND ANAEROBIC 5CC  Final   Culture   Final    NO GROWTH < 24 HOURS Performed at [facility]    Report Status PENDING  Incomplete     Time coordinating discharge: Over 30 minutes  SIGNED:  Dessa Phi, DO [facility] Pager [phone]  If 7PM-7AM, please contact night-coverage [email or web address] Password [email or web address] 09/10/2016, 11:17 AM

## 2016-09-10 NOTE — Progress Notes (Signed)
Pt slept well overnight, vitals stable and no any specific complain from pt's side, looks very excited to be discharged today, IV fluid discontinued as per MD order, tele-monitor on, will continue to monitor the patient.

## 2016-09-10 NOTE — Discharge Instructions (Signed)
1. Follow up with PCP in 1-2 weeks 2. Repeat BMP in 1 week  3. May need outpatient surgical evaluation if persistent RUQ abdominal pain, persistent nausea/vomiting. RUQ US showed dilated gallbladder with sludge and thickening, representing acalculous cholecystitis. However, all symptoms had resolved in hospital, abdominal exam benign, no nausea or vomiting, tolerating all meals.

## 2016-09-13 LAB — CULTURE, BLOOD (ROUTINE X 2)
Culture: NO GROWTH
Culture: NO GROWTH

## 2017-04-02 IMAGING — US US ABDOMEN COMPLETE
1 series · 14 of 25 positions shown · non-contrast
Comparison: [DATE]

CLINICAL DATA: Right upper quadrant pain

EXAM:
ABDOMEN ULTRASOUND COMPLETE

[Series 1: us abdomen complete · 0.22mm/px · 14 of 83 slices shown]
[im 1/83]
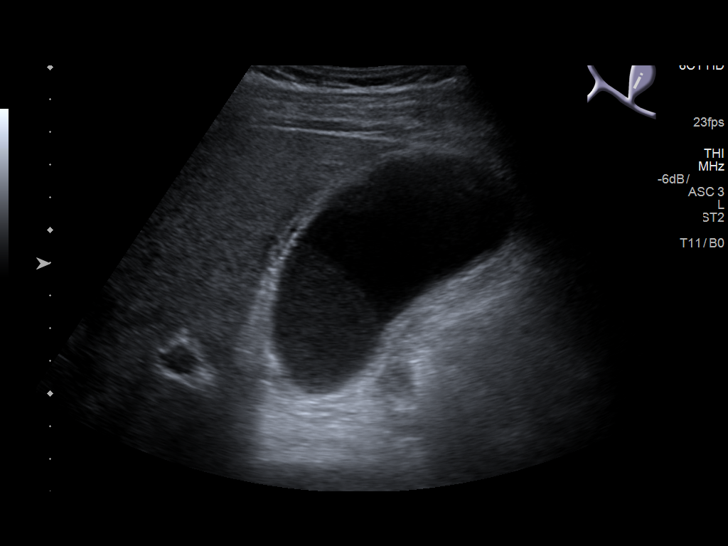
[im 7/83]
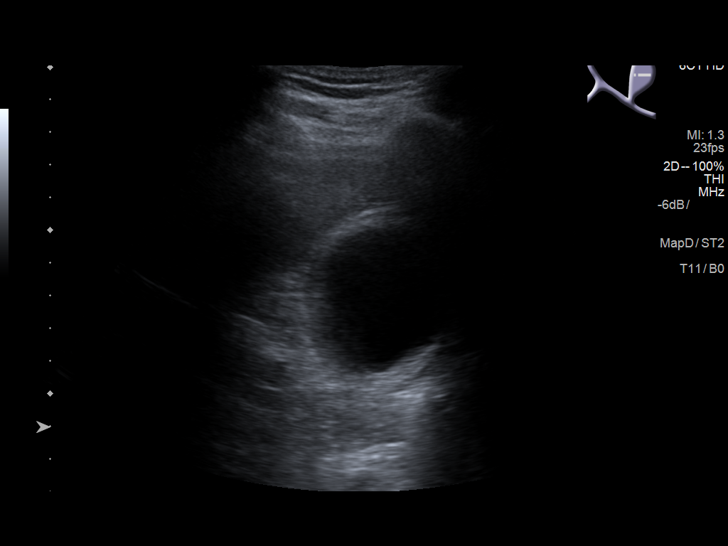
[im 14/83]
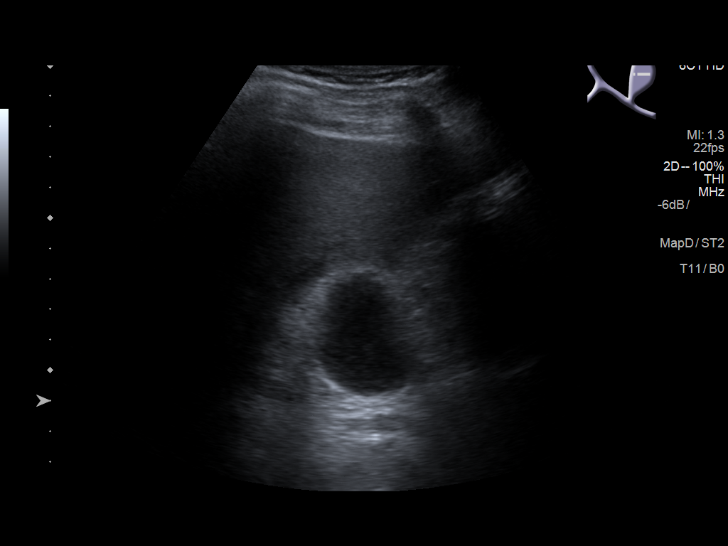
[im 21/83]
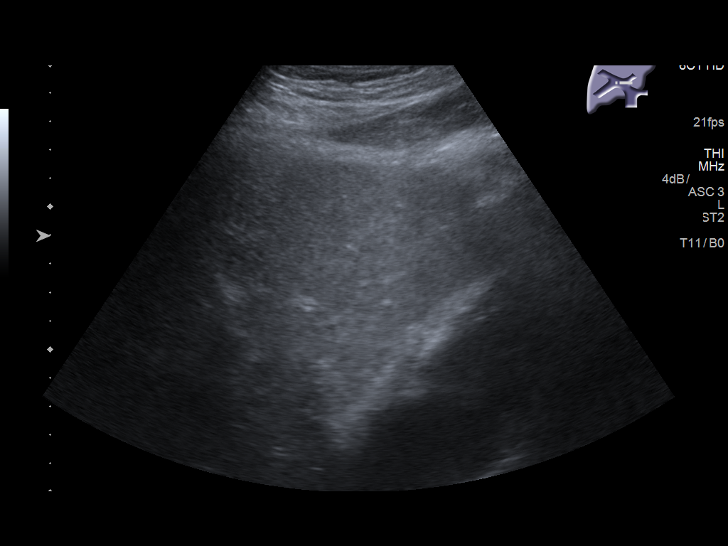
[im 28/83]
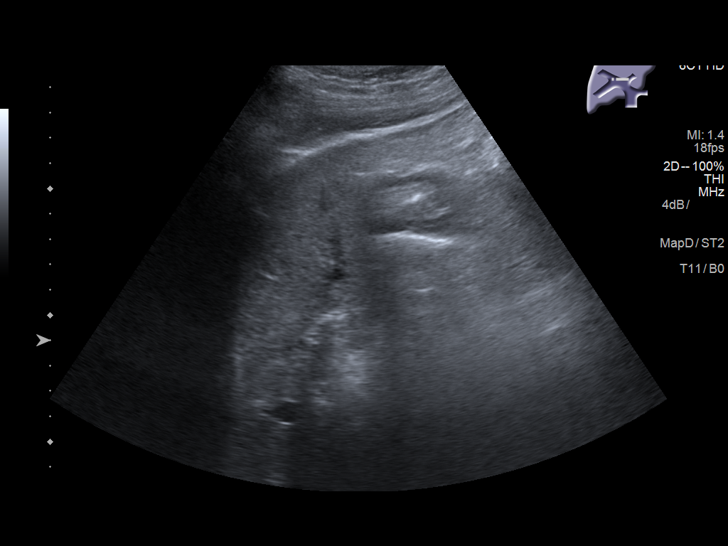
[im 31/83]
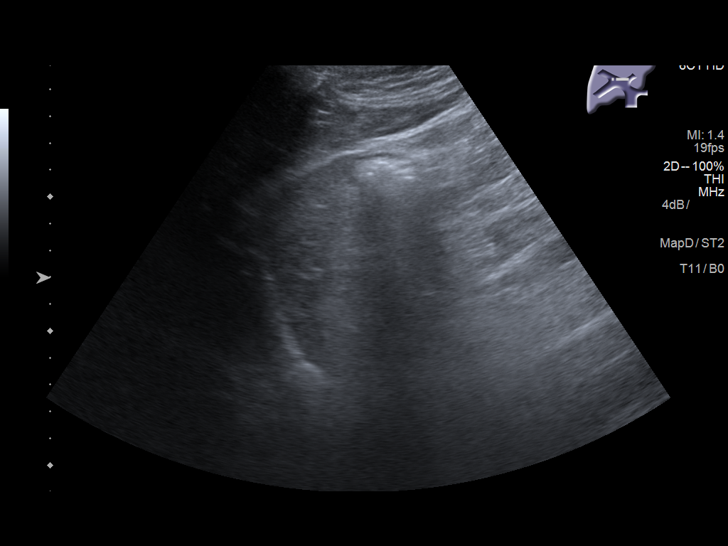
[im 38/83]
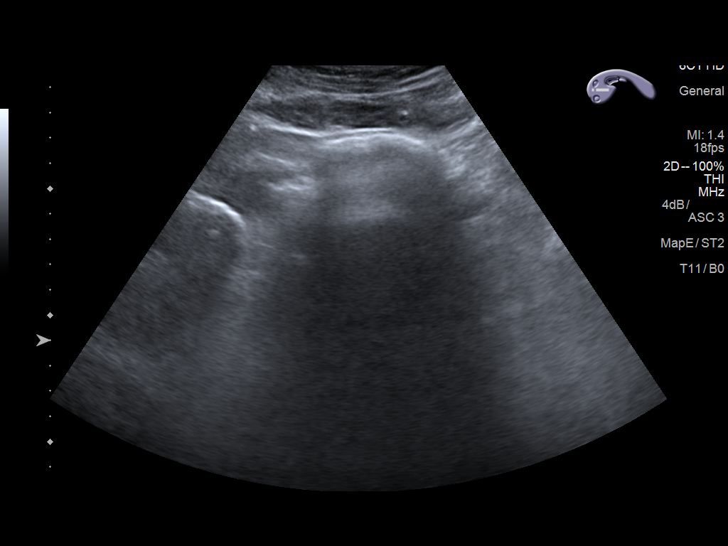
[im 45/83]
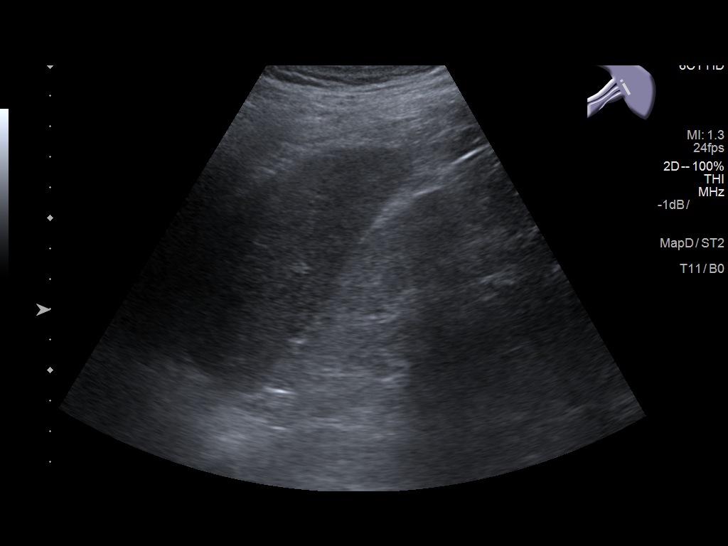
[im 52/83]
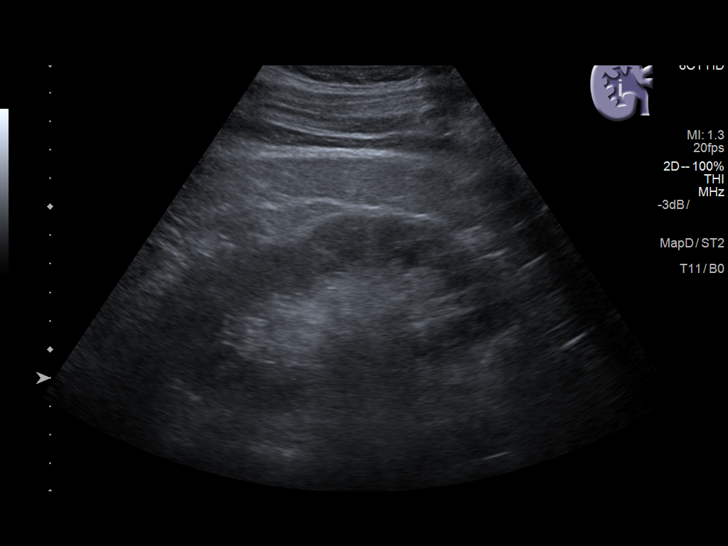
[im 55/83]
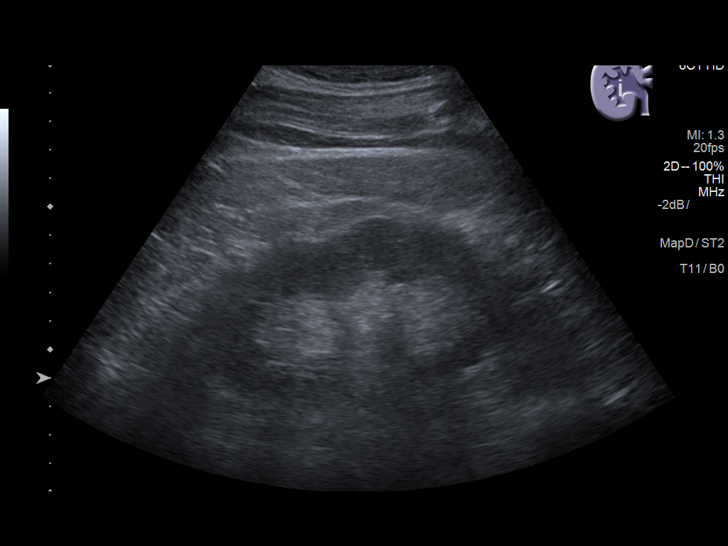
[im 62/83]
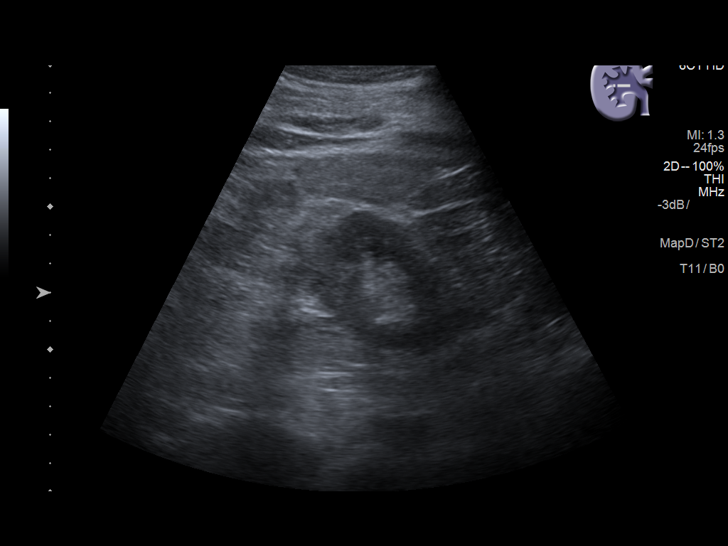
[im 69/83]
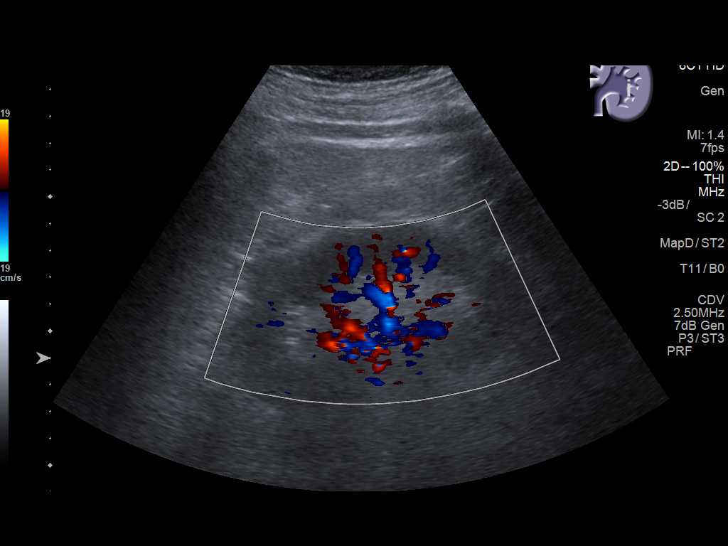
[im 76/83]
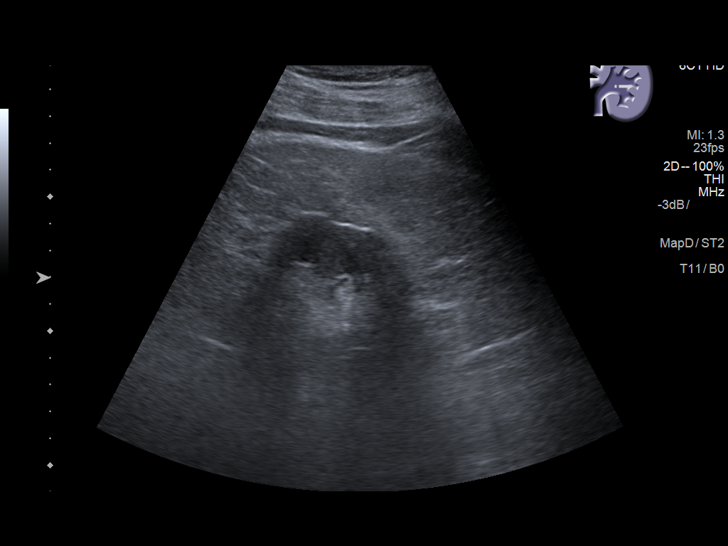
[im 83/83]
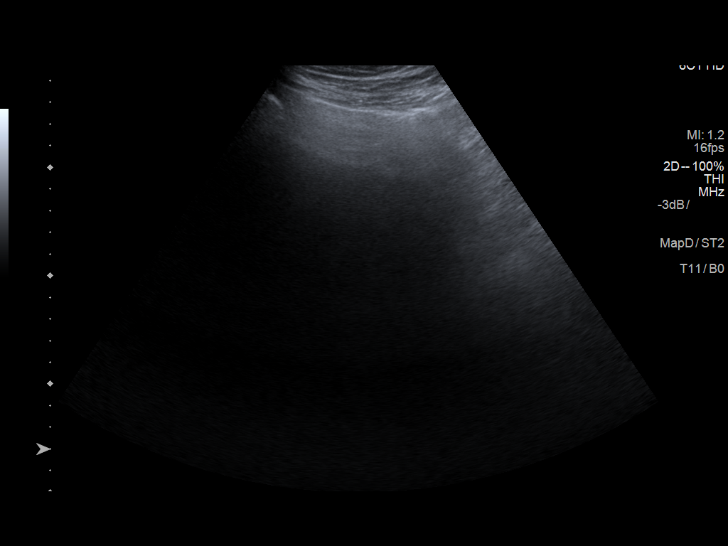

[14 of 25 positions shown; findings below may reference images not displayed]

FINDINGS: Gallbladder: Gallbladder is well distended and demonstrates some
wall thickening and a 6 mm. Gallbladder sludge is seen. No
definitive cholelithiasis is noted.

Common bile duct: Diameter: 4.2 mm.

Liver: No focal lesion identified. Within normal limits in
parenchymal echogenicity.

IVC: No abnormality visualized.

Pancreas: Not well seen due to overlying bowel gas.

Spleen: Size and appearance within normal limits.

Right Kidney: Length: 12.4 cm.. Echogenicity within normal limits.
No mass or hydronephrosis visualized.

Left Kidney: Length: 12.3 cm.. Echogenicity within normal limits. No
mass or hydronephrosis visualized.

Abdominal aorta: No aneurysm visualized.

Other findings: None.
IMPRESSION: Dilated gallbladder with sludge and wall thickening. In the
appropriate clinical setting these changes may represent acalculous
cholecystitis. HIDA scan may be helpful for further evaluation.

No other focal abnormality is seen.

## 2019-07-23 ENCOUNTER — Inpatient Hospital Stay (HOSPITAL_COMMUNITY): Payer: Medicare Other

## 2019-07-23 ENCOUNTER — Emergency Department (HOSPITAL_COMMUNITY): Payer: Medicare Other

## 2019-07-23 ENCOUNTER — Other Ambulatory Visit: Payer: Self-pay

## 2019-07-23 ENCOUNTER — Inpatient Hospital Stay (HOSPITAL_COMMUNITY)
Admission: EM | Admit: 2019-07-23 | Discharge: 2019-07-27 | DRG: 091 | Disposition: A | Discharge status: Skilled Nursing Facility | Payer: Medicare Other | Attending: Internal Medicine | Admitting: Internal Medicine

## 2019-07-23 ENCOUNTER — Encounter (HOSPITAL_COMMUNITY): Payer: Self-pay

## 2019-07-23 DIAGNOSIS — R4182 Altered mental status, unspecified: Secondary | ICD-10-CM

## 2019-07-23 DIAGNOSIS — I252 Old myocardial infarction: Secondary | ICD-10-CM

## 2019-07-23 DIAGNOSIS — G9340 Encephalopathy, unspecified: Secondary | ICD-10-CM

## 2019-07-23 DIAGNOSIS — I480 Paroxysmal atrial fibrillation: Secondary | ICD-10-CM | POA: Diagnosis not present

## 2019-07-23 DIAGNOSIS — Z7951 Long term (current) use of inhaled steroids: Secondary | ICD-10-CM

## 2019-07-23 DIAGNOSIS — Z7901 Long term (current) use of anticoagulants: Secondary | ICD-10-CM

## 2019-07-23 DIAGNOSIS — I2510 Atherosclerotic heart disease of native coronary artery without angina pectoris: Secondary | ICD-10-CM | POA: Diagnosis present

## 2019-07-23 DIAGNOSIS — R402352 Coma scale, best motor response, localizes pain, at arrival to emergency department: Secondary | ICD-10-CM | POA: Diagnosis present

## 2019-07-23 DIAGNOSIS — R627 Adult failure to thrive: Secondary | ICD-10-CM | POA: Diagnosis present

## 2019-07-23 DIAGNOSIS — J9600 Acute respiratory failure, unspecified whether with hypoxia or hypercapnia: Secondary | ICD-10-CM | POA: Diagnosis present

## 2019-07-23 DIAGNOSIS — J9601 Acute respiratory failure with hypoxia: Secondary | ICD-10-CM

## 2019-07-23 DIAGNOSIS — Z85819 Personal history of malignant neoplasm of unspecified site of lip, oral cavity, and pharynx: Secondary | ICD-10-CM

## 2019-07-23 DIAGNOSIS — E872 Acidosis: Secondary | ICD-10-CM | POA: Diagnosis present

## 2019-07-23 DIAGNOSIS — Z955 Presence of coronary angioplasty implant and graft: Secondary | ICD-10-CM

## 2019-07-23 DIAGNOSIS — M545 Low back pain: Secondary | ICD-10-CM | POA: Diagnosis present

## 2019-07-23 DIAGNOSIS — Z91041 Radiographic dye allergy status: Secondary | ICD-10-CM

## 2019-07-23 DIAGNOSIS — N400 Enlarged prostate without lower urinary tract symptoms: Secondary | ICD-10-CM | POA: Diagnosis present

## 2019-07-23 DIAGNOSIS — Z8249 Family history of ischemic heart disease and other diseases of the circulatory system: Secondary | ICD-10-CM

## 2019-07-23 DIAGNOSIS — E1122 Type 2 diabetes mellitus with diabetic chronic kidney disease: Secondary | ICD-10-CM | POA: Diagnosis present

## 2019-07-23 DIAGNOSIS — Z20828 Contact with and (suspected) exposure to other viral communicable diseases: Secondary | ICD-10-CM | POA: Diagnosis present

## 2019-07-23 DIAGNOSIS — G4733 Obstructive sleep apnea (adult) (pediatric): Secondary | ICD-10-CM | POA: Diagnosis present

## 2019-07-23 DIAGNOSIS — I160 Hypertensive urgency: Secondary | ICD-10-CM | POA: Diagnosis present

## 2019-07-23 DIAGNOSIS — Z888 Allergy status to other drugs, medicaments and biological substances status: Secondary | ICD-10-CM

## 2019-07-23 DIAGNOSIS — F0390 Unspecified dementia without behavioral disturbance: Secondary | ICD-10-CM | POA: Diagnosis present

## 2019-07-23 DIAGNOSIS — Z9911 Dependence on respirator [ventilator] status: Secondary | ICD-10-CM

## 2019-07-23 DIAGNOSIS — E785 Hyperlipidemia, unspecified: Secondary | ICD-10-CM | POA: Diagnosis present

## 2019-07-23 DIAGNOSIS — N183 Chronic kidney disease, stage 3 (moderate): Secondary | ICD-10-CM | POA: Diagnosis present

## 2019-07-23 DIAGNOSIS — E860 Dehydration: Secondary | ICD-10-CM | POA: Diagnosis not present

## 2019-07-23 DIAGNOSIS — R402112 Coma scale, eyes open, never, at arrival to emergency department: Secondary | ICD-10-CM | POA: Diagnosis not present

## 2019-07-23 DIAGNOSIS — R402212 Coma scale, best verbal response, none, at arrival to emergency department: Secondary | ICD-10-CM | POA: Diagnosis not present

## 2019-07-23 DIAGNOSIS — T428X5A Adverse effect of antiparkinsonism drugs and other central muscle-tone depressants, initial encounter: Secondary | ICD-10-CM | POA: Diagnosis present

## 2019-07-23 DIAGNOSIS — R451 Restlessness and agitation: Secondary | ICD-10-CM | POA: Diagnosis present

## 2019-07-23 DIAGNOSIS — Z79899 Other long term (current) drug therapy: Secondary | ICD-10-CM

## 2019-07-23 DIAGNOSIS — R5381 Other malaise: Secondary | ICD-10-CM | POA: Diagnosis present

## 2019-07-23 DIAGNOSIS — Z7984 Long term (current) use of oral hypoglycemic drugs: Secondary | ICD-10-CM

## 2019-07-23 DIAGNOSIS — I361 Nonrheumatic tricuspid (valve) insufficiency: Secondary | ICD-10-CM | POA: Diagnosis present

## 2019-07-23 DIAGNOSIS — G92 Toxic encephalopathy: Secondary | ICD-10-CM | POA: Diagnosis present

## 2019-07-23 DIAGNOSIS — Z7902 Long term (current) use of antithrombotics/antiplatelets: Secondary | ICD-10-CM

## 2019-07-23 DIAGNOSIS — I129 Hypertensive chronic kidney disease with stage 1 through stage 4 chronic kidney disease, or unspecified chronic kidney disease: Secondary | ICD-10-CM | POA: Diagnosis present

## 2019-07-23 DIAGNOSIS — E871 Hypo-osmolality and hyponatremia: Secondary | ICD-10-CM

## 2019-07-23 DIAGNOSIS — Z91030 Bee allergy status: Secondary | ICD-10-CM

## 2019-07-23 DIAGNOSIS — Z7982 Long term (current) use of aspirin: Secondary | ICD-10-CM

## 2019-07-23 DIAGNOSIS — Z781 Physical restraint status: Secondary | ICD-10-CM

## 2019-07-23 LAB — CREATININE, URINE, RANDOM: Creatinine, Urine: 129.08 mg/dL

## 2019-07-23 LAB — CBC WITH DIFFERENTIAL/PLATELET
Abs Immature Granulocytes: 0.04 10*3/uL (ref 0.00–0.07)
Basophils Absolute: 0 10*3/uL (ref 0.0–0.1)
Basophils Relative: 0 %
Eosinophils Absolute: 0 10*3/uL (ref 0.0–0.5)
Eosinophils Relative: 0 %
HCT: 38.8 % — ABNORMAL LOW (ref 39.0–52.0)
Hemoglobin: 13.2 g/dL (ref 13.0–17.0)
Immature Granulocytes: 0 %
Lymphocytes Relative: 12 %
Lymphs Abs: 1.3 10*3/uL (ref 0.7–4.0)
MCH: 28.2 pg (ref 26.0–34.0)
MCHC: 34 g/dL (ref 30.0–36.0)
MCV: 82.9 fL (ref 80.0–100.0)
Monocytes Absolute: 1.1 10*3/uL — ABNORMAL HIGH (ref 0.1–1.0)
Monocytes Relative: 9 %
Neutro Abs: 9.1 10*3/uL — ABNORMAL HIGH (ref 1.7–7.7)
Neutrophils Relative %: 79 %
Platelets: 235 10*3/uL (ref 150–400)
RBC: 4.68 MIL/uL (ref 4.22–5.81)
RDW: 14 % (ref 11.5–15.5)
WBC: 11.6 10*3/uL — ABNORMAL HIGH (ref 4.0–10.5)
nRBC: 0 % (ref 0.0–0.2)

## 2019-07-23 LAB — BASIC METABOLIC PANEL
Anion gap: 11 (ref 5–15)
BUN: 21 mg/dL (ref 8–23)
CO2: 25 mmol/L (ref 22–32)
Calcium: 9.1 mg/dL (ref 8.9–10.3)
Chloride: 93 mmol/L — ABNORMAL LOW (ref 98–111)
Creatinine, Ser: 1.27 mg/dL — ABNORMAL HIGH (ref 0.61–1.24)
GFR calc Af Amer: >60 mL/min (ref >60)
GFR calc non Af Amer: 52 mL/min — ABNORMAL LOW (ref >60)
Glucose, Bld: 174 mg/dL — ABNORMAL HIGH (ref 70–99)
Potassium: 4.2 mmol/L (ref 3.5–5.1)
Sodium: 129 mmol/L — ABNORMAL LOW (ref 135–145)

## 2019-07-23 LAB — CBG MONITORING, ED: Glucose-Capillary: 167 mg/dL — ABNORMAL HIGH (ref 70–99)

## 2019-07-23 LAB — POCT I-STAT 7, (LYTES, BLD GAS, ICA,H+H)
Acid-base deficit: 1 mmol/L (ref 0.0–2.0)
Bicarbonate: 23.5 mmol/L (ref 20.0–28.0)
Calcium, Ion: 1.22 mmol/L (ref 1.15–1.40)
HCT: 38 % — ABNORMAL LOW (ref 39.0–52.0)
Hemoglobin: 12.9 g/dL — ABNORMAL LOW (ref 13.0–17.0)
O2 Saturation: 99 %
Potassium: 3.8 mmol/L (ref 3.5–5.1)
Sodium: 129 mmol/L — ABNORMAL LOW (ref 135–145)
TCO2: 25 mmol/L (ref 22–32)
pCO2 arterial: 36 mmHg (ref 32.0–48.0)
pH, Arterial: 7.423 (ref 7.350–7.450)
pO2, Arterial: 135 mmHg — ABNORMAL HIGH (ref 83.0–108.0)

## 2019-07-23 LAB — URINALYSIS, ROUTINE W REFLEX MICROSCOPIC
Bacteria, UA: NONE SEEN
Bilirubin Urine: NEGATIVE
Glucose, UA: 150 mg/dL — AB
Hgb urine dipstick: NEGATIVE
Ketones, ur: 20 mg/dL — AB
Leukocytes,Ua: NEGATIVE
Nitrite: NEGATIVE
Protein, ur: 30 mg/dL — AB
Specific Gravity, Urine: 1.015 (ref 1.005–1.030)
pH: 6 (ref 5.0–8.0)

## 2019-07-23 LAB — COMPREHENSIVE METABOLIC PANEL
ALT: 22 U/L (ref 0–44)
AST: 50 U/L — ABNORMAL HIGH (ref 15–41)
Albumin: 4.1 g/dL (ref 3.5–5.0)
Alkaline Phosphatase: 62 U/L (ref 38–126)
Anion gap: 12 (ref 5–15)
BUN: 24 mg/dL — ABNORMAL HIGH (ref 8–23)
CO2: 25 mmol/L (ref 22–32)
Calcium: 9.4 mg/dL (ref 8.9–10.3)
Chloride: 91 mmol/L — ABNORMAL LOW (ref 98–111)
Creatinine, Ser: 1.58 mg/dL — ABNORMAL HIGH (ref 0.61–1.24)
GFR calc Af Amer: 47 mL/min — ABNORMAL LOW (ref >60)
GFR calc non Af Amer: 40 mL/min — ABNORMAL LOW (ref >60)
Glucose, Bld: 179 mg/dL — ABNORMAL HIGH (ref 70–99)
Potassium: 4.2 mmol/L (ref 3.5–5.1)
Sodium: 128 mmol/L — ABNORMAL LOW (ref 135–145)
Total Bilirubin: 1.2 mg/dL (ref 0.3–1.2)
Total Protein: 7.5 g/dL (ref 6.5–8.1)

## 2019-07-23 LAB — LACTIC ACID, PLASMA
Lactic Acid, Venous: 2.9 mmol/L (ref 0.5–1.9)
Lactic Acid, Venous: 2.5 mmol/L (ref 0.5–1.9)

## 2019-07-23 LAB — CREATININE, SERUM
Creatinine, Ser: 1.24 mg/dL (ref 0.61–1.24)
GFR calc Af Amer: >60 mL/min (ref >60)
GFR calc non Af Amer: 54 mL/min — ABNORMAL LOW (ref >60)

## 2019-07-23 LAB — GLUCOSE, CAPILLARY
Glucose-Capillary: 178 mg/dL — ABNORMAL HIGH (ref 70–99)
Glucose-Capillary: 150 mg/dL — ABNORMAL HIGH (ref 70–99)

## 2019-07-23 LAB — TROPONIN I (HIGH SENSITIVITY): Troponin I (High Sensitivity): 17 ng/L (ref <18)

## 2019-07-23 LAB — MAGNESIUM: Magnesium: 1.9 mg/dL (ref 1.7–2.4)

## 2019-07-23 LAB — RAPID URINE DRUG SCREEN, HOSP PERFORMED
Amphetamines: NOT DETECTED
Barbiturates: NOT DETECTED
Benzodiazepines: NOT DETECTED
Cocaine: NOT DETECTED
Opiates: NOT DETECTED
Tetrahydrocannabinol: NOT DETECTED

## 2019-07-23 LAB — CK: Total CK: 1617 U/L — ABNORMAL HIGH (ref 49–397)

## 2019-07-23 LAB — SODIUM, URINE, RANDOM: Sodium, Ur: 36 mmol/L

## 2019-07-23 LAB — OSMOLALITY, URINE: Osmolality, Ur: 481 mOsm/kg (ref 300–900)

## 2019-07-23 LAB — TSH: TSH: 0.512 u[IU]/mL (ref 0.350–4.500)

## 2019-07-23 LAB — APTT: aPTT: 39 seconds — ABNORMAL HIGH (ref 24–36)

## 2019-07-23 LAB — HEPARIN LEVEL (UNFRACTIONATED): Heparin Unfractionated: 1.5 IU/mL — ABNORMAL HIGH (ref 0.30–0.70)

## 2019-07-23 LAB — AMMONIA: Ammonia: 11 umol/L (ref 9–35)

## 2019-07-23 LAB — SARS CORONAVIRUS 2 BY RT PCR (HOSPITAL ORDER, PERFORMED IN ~~LOC~~ HOSPITAL LAB): SARS Coronavirus 2: NEGATIVE

## 2019-07-23 LAB — PROTIME-INR
INR: 1.2 (ref 0.8–1.2)
Prothrombin Time: 14.9 seconds (ref 11.4–15.2)

## 2019-07-23 LAB — HEMOGLOBIN A1C
Hgb A1c MFr Bld: 6.3 % — ABNORMAL HIGH (ref 4.8–5.6)
Mean Plasma Glucose: 134.11 mg/dL

## 2019-07-23 LAB — MRSA PCR SCREENING: MRSA by PCR: NEGATIVE

## 2019-07-23 IMAGING — CT CT HEAD W/O CM
4 series · 16 of 47 positions shown, 18 images · non-contrast
Comparison: Head CT scan and brain MRI [DATE].

CLINICAL DATA: Patient found down today.  Altered mental status.

EXAM:
CT HEAD WITHOUT CONTRAST
TECHNIQUE: Contiguous axial images were obtained from the base of the skull
through the vertex without intravenous contrast.

[Series 3: head without · axial · non-contrast · 0.43mm/px · z∈[-149,-29]mm · 7 of 34 slices shown, 9 images]
[im 5/34  brain]
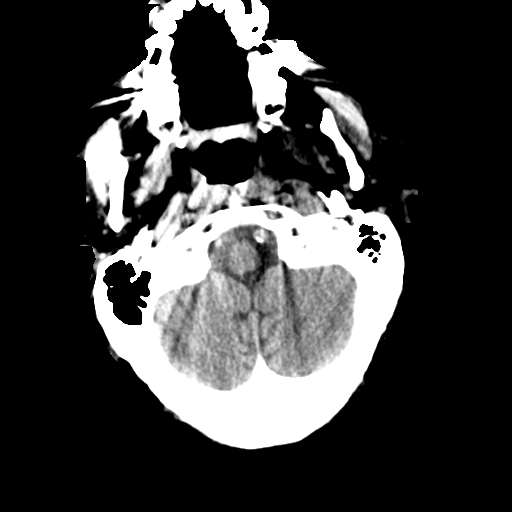
[im 5/34  bone]
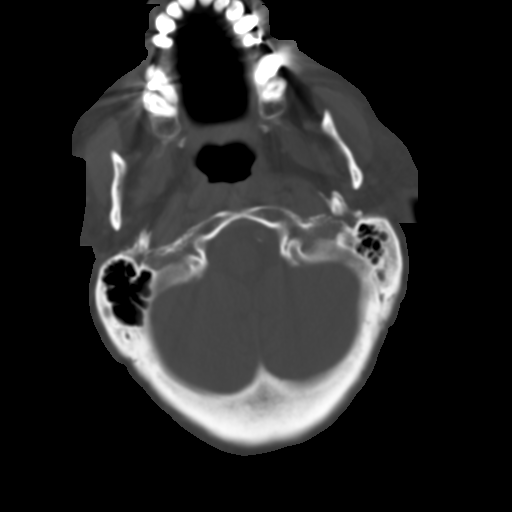
[im 9/34  brain]
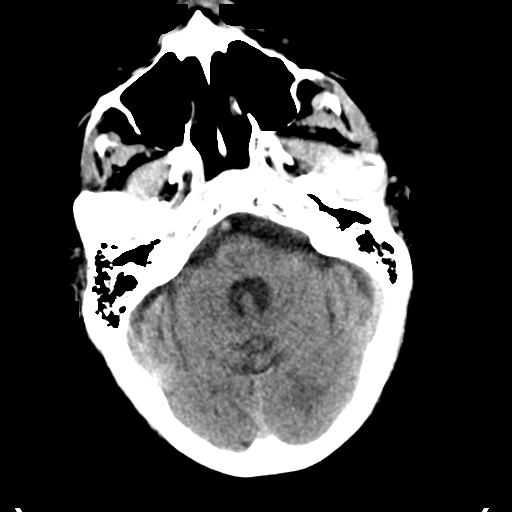
[im 13/34  brain]
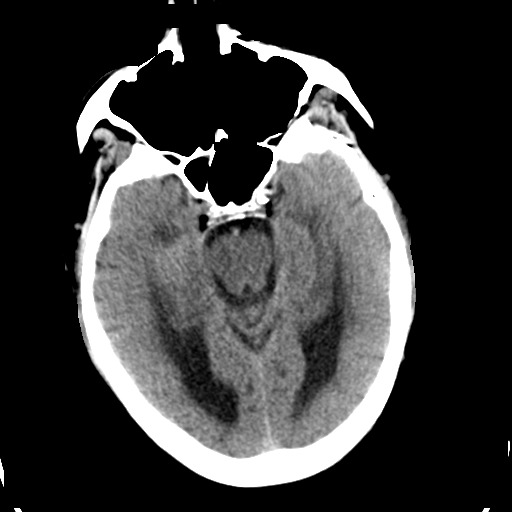
[im 17/34  brain]
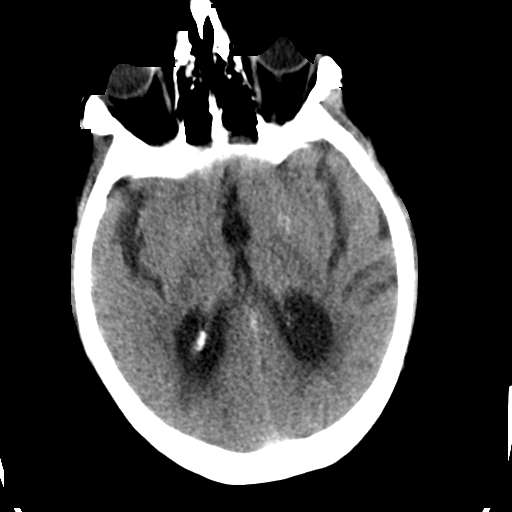
[im 21/34  brain]
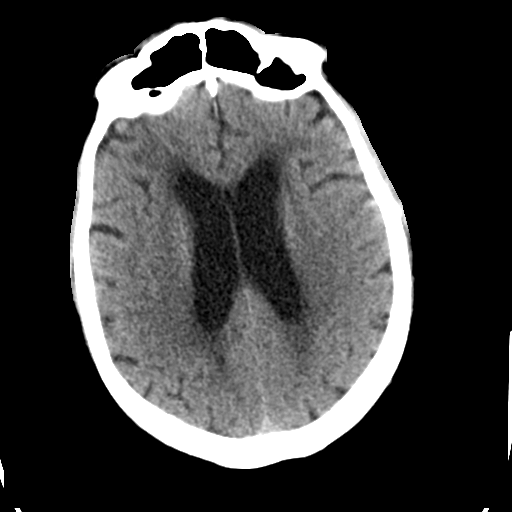
[im 21/34  bone]
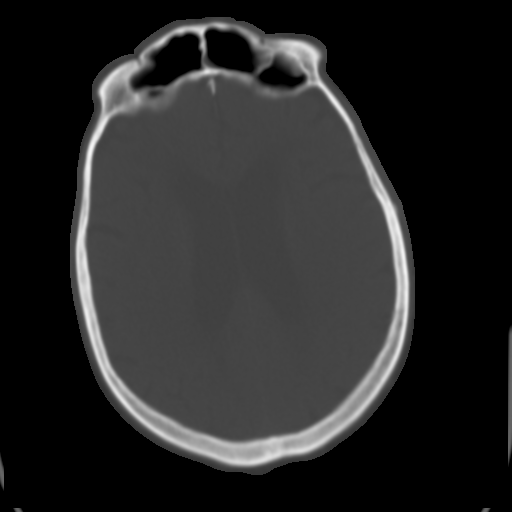
[im 25/34  brain]
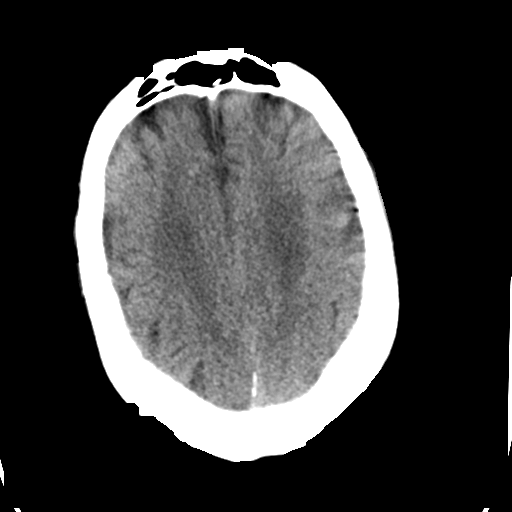
[im 29/34  brain]
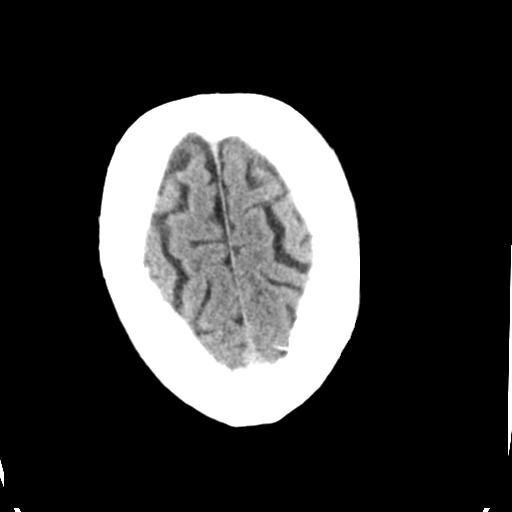

[Series 4: head bone · axial · 0.43mm/px · z∈[-153,-119]mm · 3 of 85 slices shown]
[im 9/85  bone]
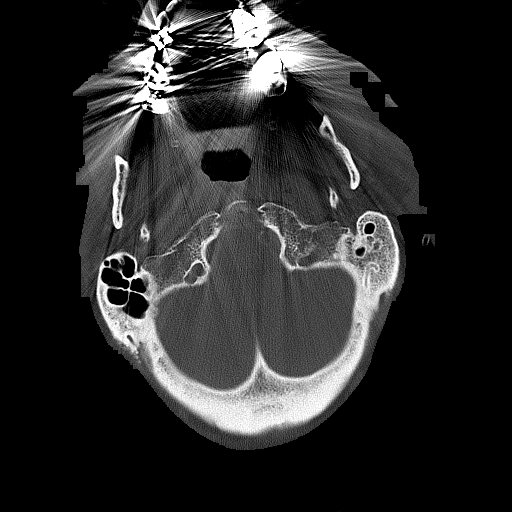
[im 17/85  bone]
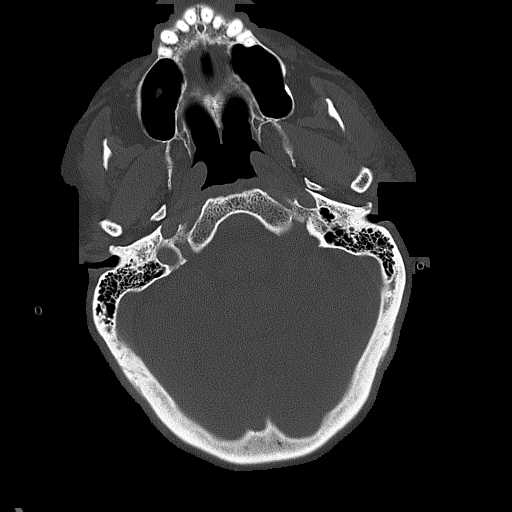
[im 26/85  bone]
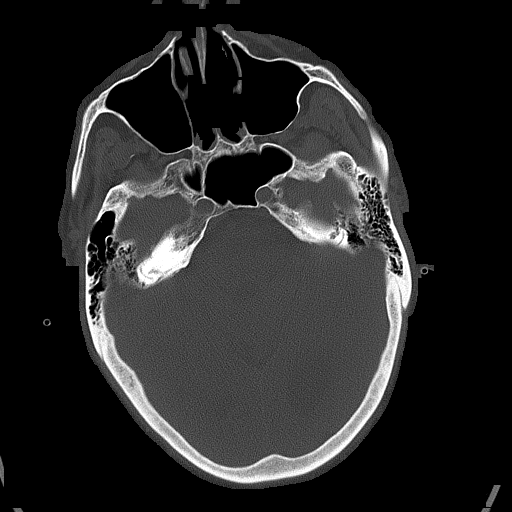

[Series 5: head without cor · coronal · non-contrast · 0.33mm/px · 3 of 74 slices shown]
[im 28/74  brain]
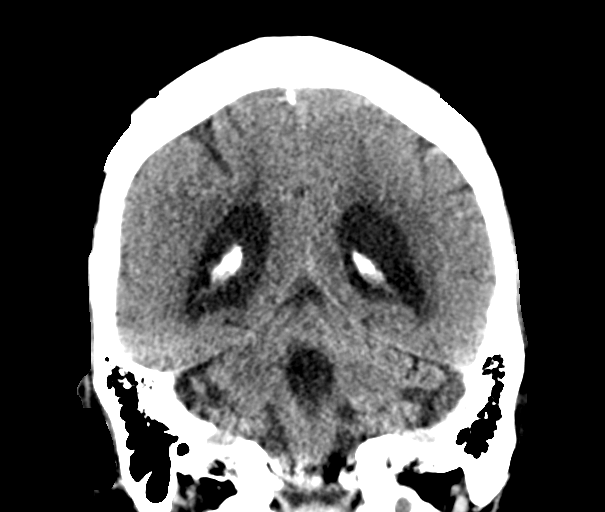
[im 34/74  brain]
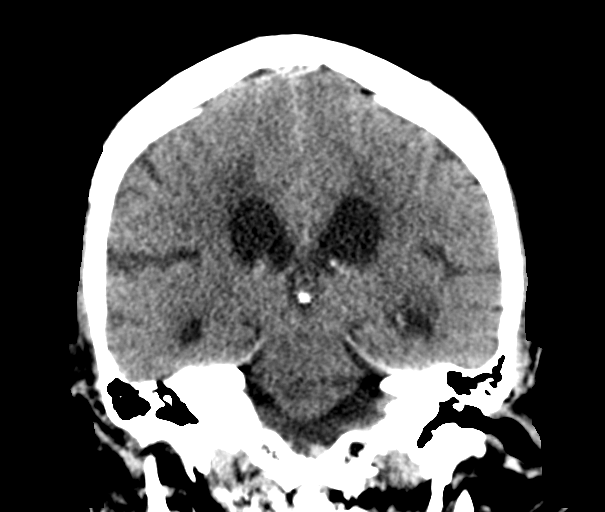
[im 40/74  brain]
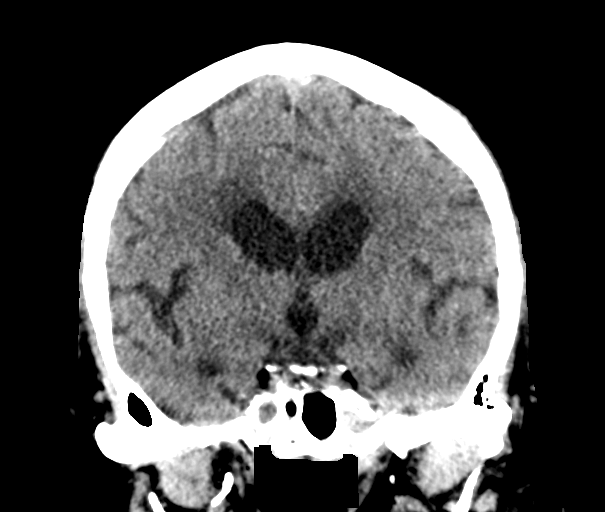

[Series 6: head without sag · sagittal · non-contrast · 0.33mm/px · 3 of 67 slices shown]
[im 23/67  brain]
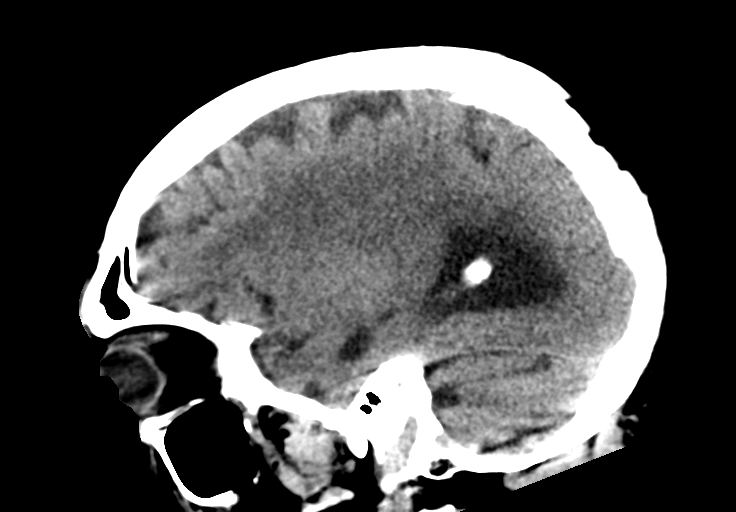
[im 34/67  brain]
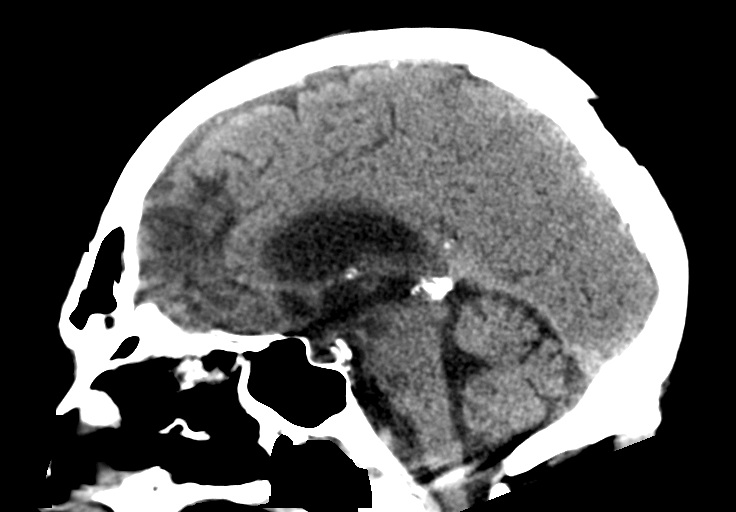
[im 45/67  brain]
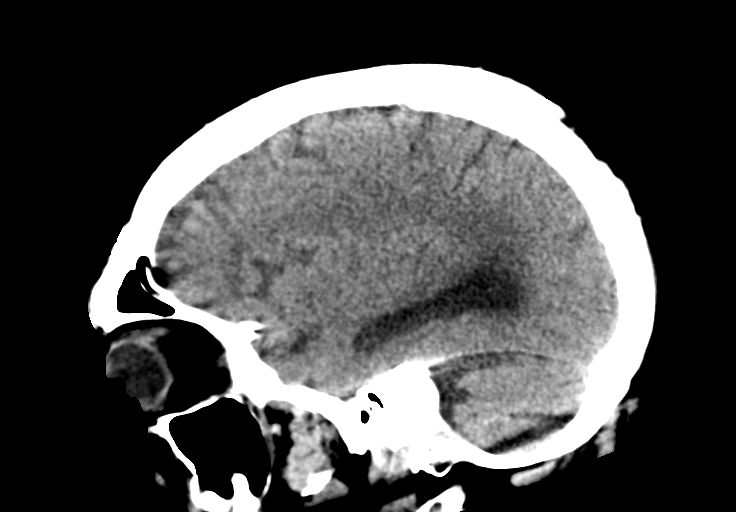

[16 of 47 positions shown; findings below may reference images not displayed]

FINDINGS: Brain: No evidence of acute infarction, hemorrhage, hydrocephalus,
extra-axial collection or mass lesion/mass effect. Chronic
microvascular ischemic change is again seen.

Vascular: No hyperdense vessel or unexpected calcification.

Skull: Intact.  No focal lesion.

Sinuses/Orbits: Mild ethmoid air cell disease and minimal mucosal
thickening in the frontal sinuses noted.

Other: None.
IMPRESSION: No acute abnormality.

Chronic microvascular ischemic change.

## 2019-07-23 IMAGING — MR MR MRA HEAD W/O CM
10 series · 16 of 16 positions shown · non-contrast
Comparison: CT head without contrast [DATE] in [DATE].

CLINICAL DATA: Altered level of consciousness (LOC), unexplained
Unresponsive, weakness in left arm and left leg compared to right.;
Stroke, follow up



[Series 2: DWI · axial · 3.0mm · 0.94mm/px · 1 of 122 slices shown (1 of 2)]
[im 1/122]
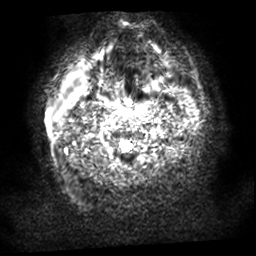

[Series 4: DWI · coronal · 4.0mm · 0.94mm/px · 1 of 76 slices shown (2 of 2)]
[im 1/76]
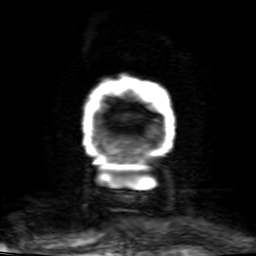

[Series 5: FLAIR · sagittal · 5.0mm · 0.47mm/px · 1 of 27 slices shown (1 of 2)]
[im 1/27]
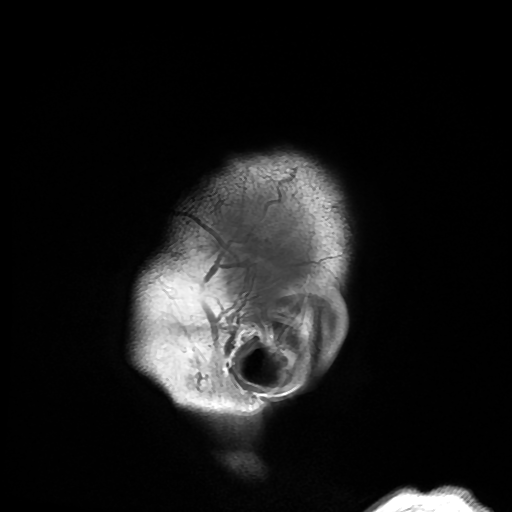

[Series 7: FLAIR · axial · 3.0mm · 0.47mm/px · 1 of 29 slices shown (2 of 2)]
[im 1/29]
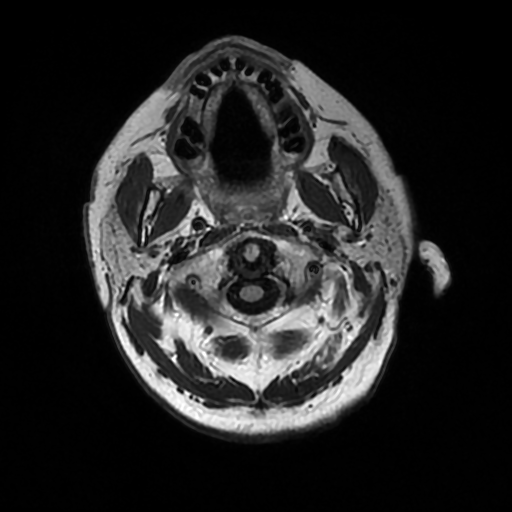

[Series 9: T1 · axial · non-contrast · 3.0mm · 0.94mm/px · 1 of 58 slices shown]
[im 1/58]
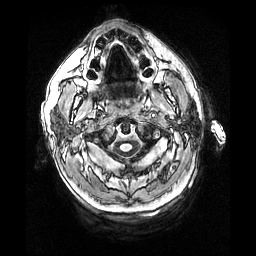

[Series 10: T2 post-contrast · coronal · 5.0mm · 0.47mm/px · 1 of 32 slices shown]
[im 1/32]
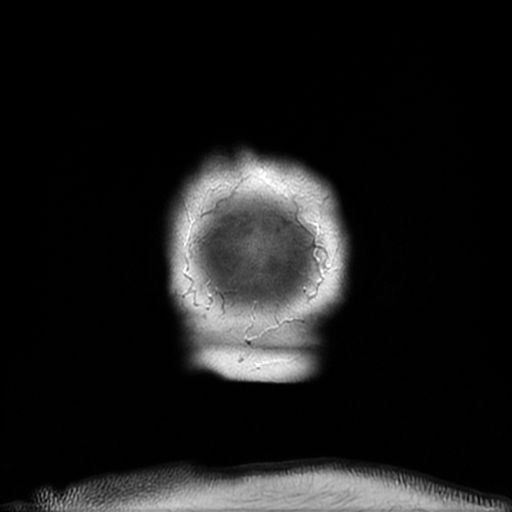

[Series 12: TOF · axial · 2.4mm · 0.47mm/px · 1 of 136 slices shown]
[im 1/136]
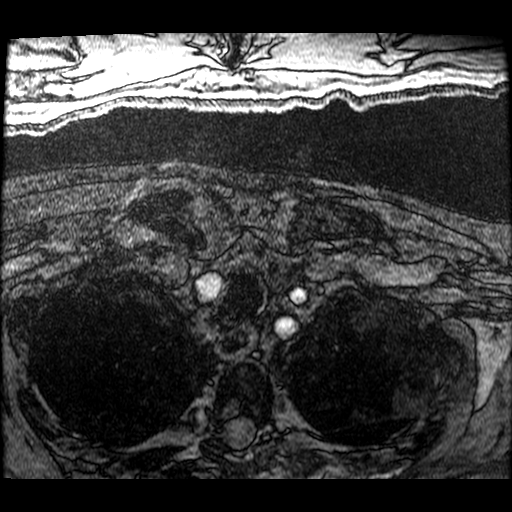

[Series 13: sag inhance (id) · sagittal · 1.2mm · 0.47mm/px · 7 of 414 slices shown]
[im 1/414]
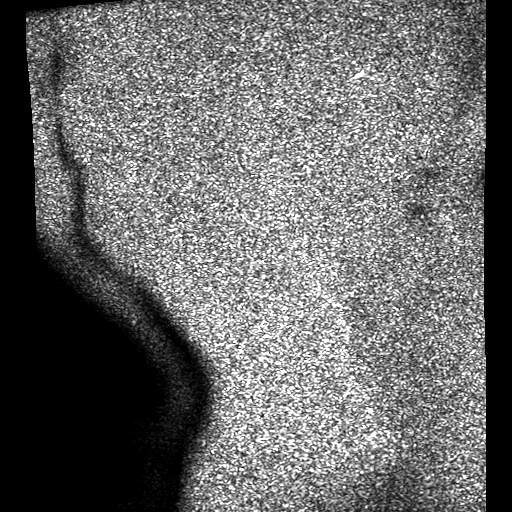
[im 69/414]
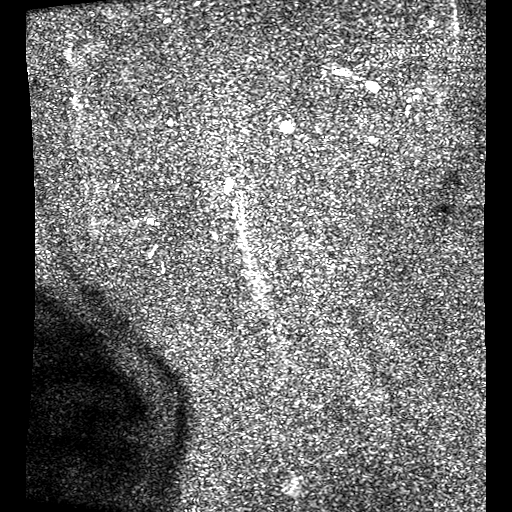
[im 138/414]
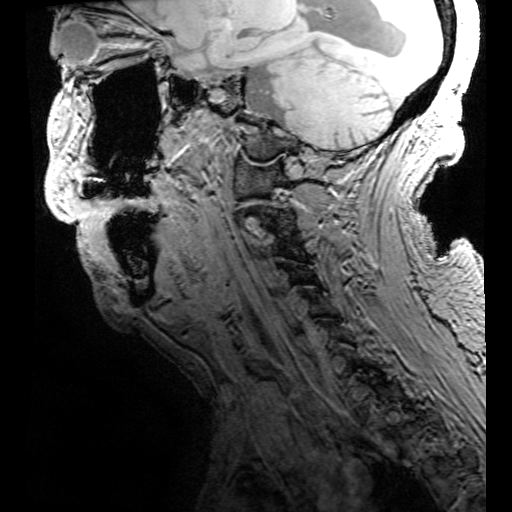
[im 207/414]
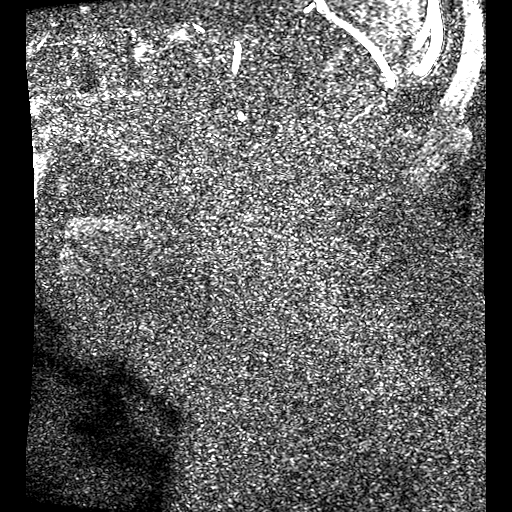
[im 276/414]
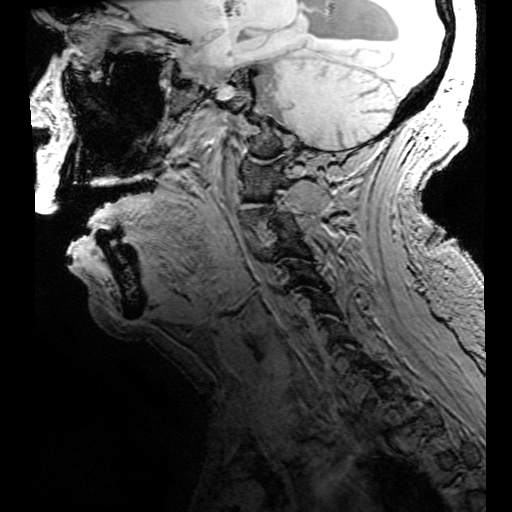
[im 345/414]
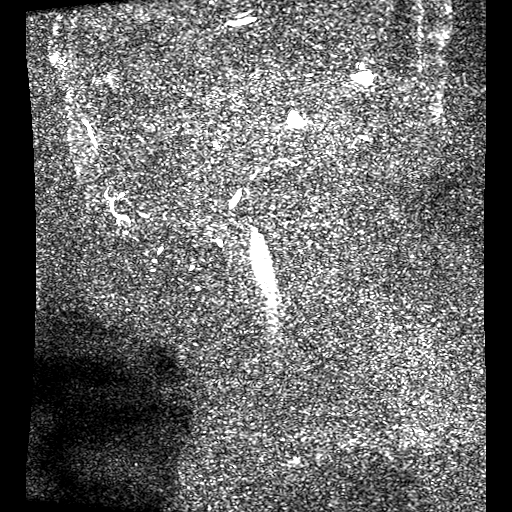
[im 414/414]
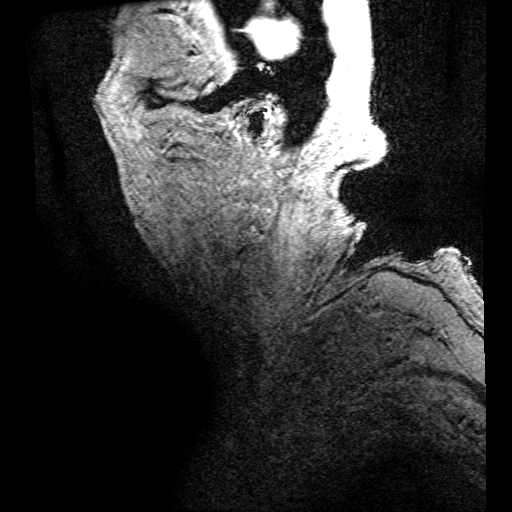

[Series 250: ADC · axial · 3.0mm · 0.94mm/px · 1 of 61 slices shown (1 of 2)]
[im 1/61]
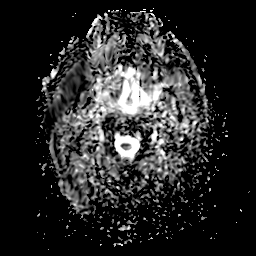

[Series 450: ADC · coronal · 4.0mm · 0.94mm/px · 1 of 38 slices shown (2 of 2)]
[im 1/38]
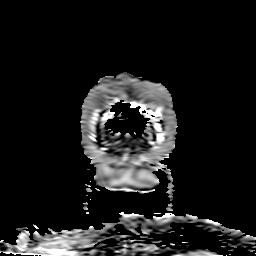

[16 of 16 positions shown; findings below may reference images not displayed]

MR
head without contrast [DATE]. Studies performed [DATE] were
done at [facility] [REDACTED]
FINDINGS: MRI HEAD FINDINGS

Brain: No acute infarct, hemorrhage, or mass lesion is present. Mild
atrophy and moderate diffuse white matter disease is present
bilaterally. There is volume loss with ex vacuo dilation of the
lateral ventricles. No significant extraaxial fluid collection is
present. Brainstem and cerebellum are within normal limits.

Vascular: Flow is present in the major intracranial arteries.

Skull and upper cervical spine: The craniocervical junction is
normal. Upper cervical spine is within normal limits. Marrow signal
is unremarkable.

Sinuses/Orbits: Scattered ethmoid opacification is similar the prior
study. Mild mucosal thickening is present in the frontal sinuses
bilaterally. The paranasal sinuses and mastoid air cells are
otherwise clear. Bilateral lens replacements are noted. Globes and
orbits are otherwise unremarkable.

MRA HEAD FINDINGS

The internal carotid arteries are within normal limits from the high
cervical segments through the ICA termini bilaterally. The A1 and M1
segments are normal. The anterior communicating artery is present.
ACA and MCA branch vessels are within normal limits.

MRA NECK FINDINGS

Time-of-flight images demonstrate normal flow in the carotid
arteries bilaterally. There is no significant flow disturbance at
either carotid bifurcation. Flow is antegrade in the vertebral
arteries bilaterally. The left vertebral artery is dominant.
IMPRESSION: 1. No acute intracranial abnormality.
2. Stable diffuse white matter disease, moderately advanced for age.
3. Normal MRA circle-of-Willis without significant proximal
stenosis, aneurysm, or branch vessel occlusion.
4. Normal MRA of the neck without contrast. No significant stenosis.

## 2019-07-23 IMAGING — MR MR MRA NECK W/O CM
11 of 13 series · 27 of 48 positions shown · non-contrast
Comparison: CT head without contrast [DATE] in [DATE].

CLINICAL DATA: Altered level of consciousness (LOC), unexplained
Unresponsive, weakness in left arm and left leg compared to right.;
Stroke, follow up



[Series 2: DWI · axial · 3.0mm · 0.94mm/px · z∈[-123,+57]mm · 5 of 122 slices shown (1 of 2)]
[im 1/122]
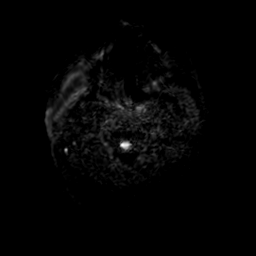
[im 31/122]
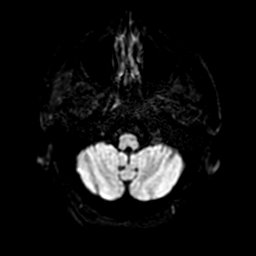
[im 61/122]
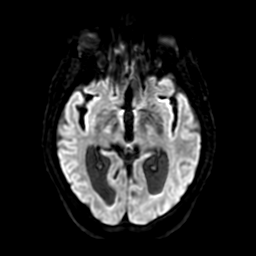
[im 91/122]
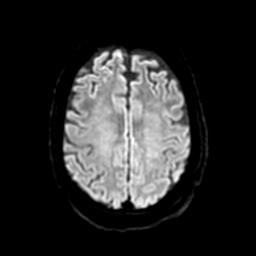
[im 122/122]
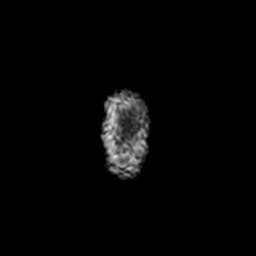

[Series 3: ax (id) · axial · 1.0mm · 0.43mm/px · z∈[-76,+0]mm · 6 of 184 slices shown]
[im 1/184]
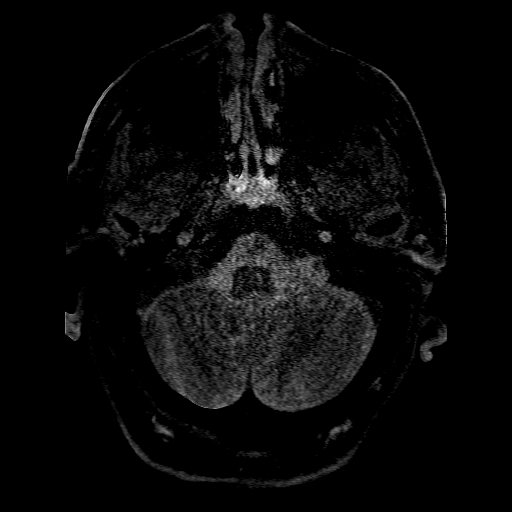
[im 31/184]
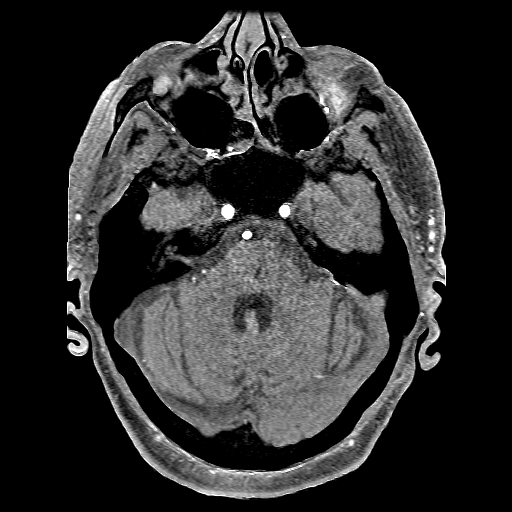
[im 62/184]
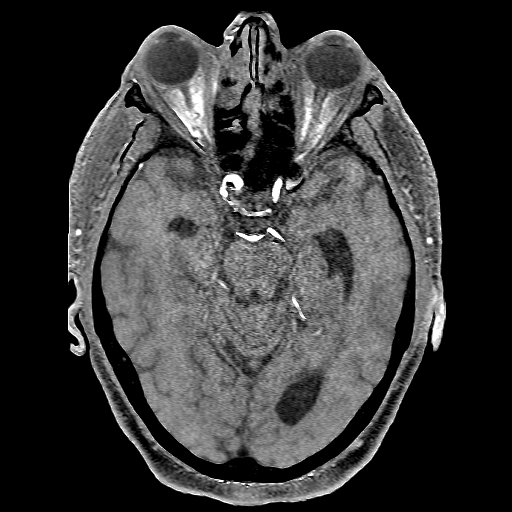
[im 92/184]
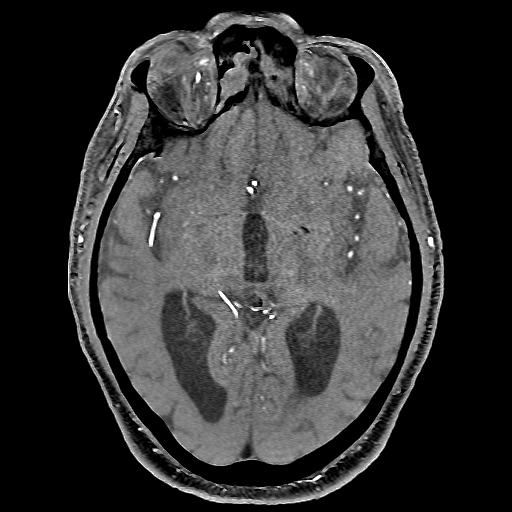
[im 123/184]
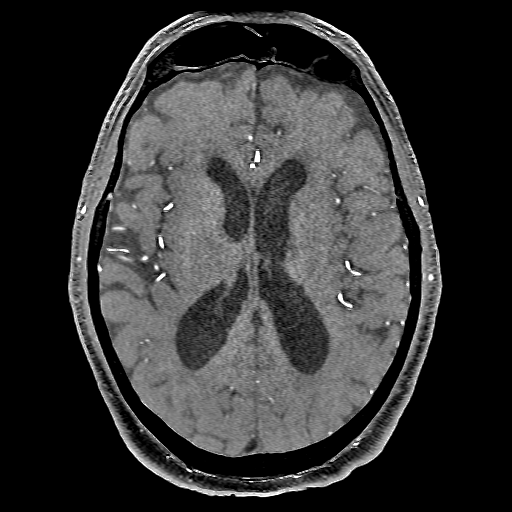
[im 153/184]
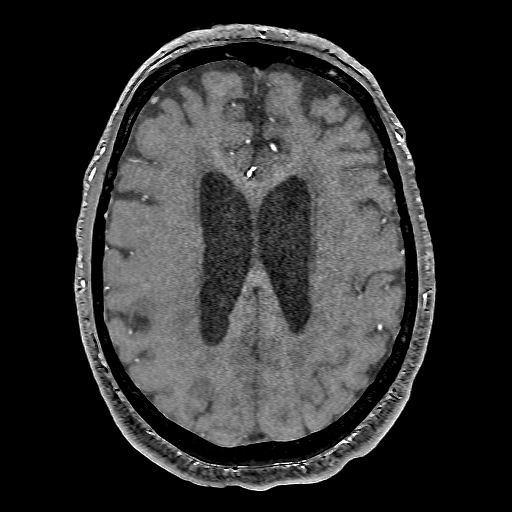

[Series 4: DWI · coronal · 4.0mm · 0.94mm/px · 3 of 76 slices shown (2 of 2)]
[im 1/76]
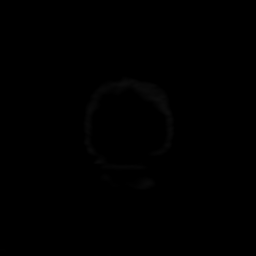
[im 38/76]
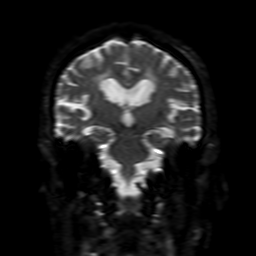
[im 76/76]
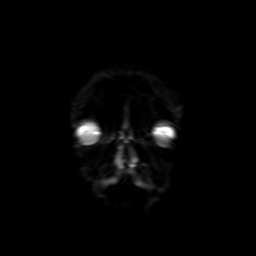

[Series 5: FLAIR · sagittal · 5.0mm · 0.47mm/px · 1 of 27 slices shown (1 of 2)]
[im 1/27]
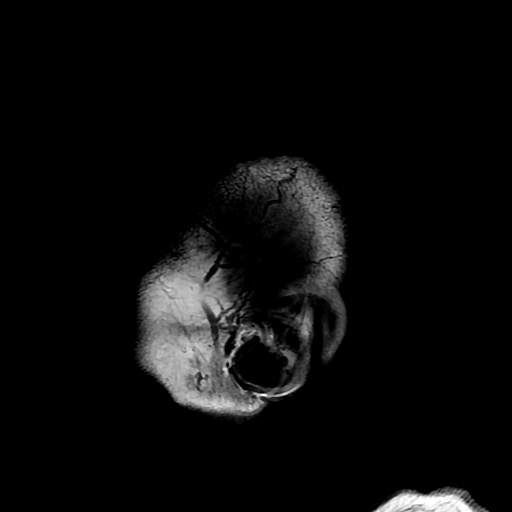

[Series 6: T2 · axial · 5.0mm · 0.47mm/px · 1 of 29 slices shown]
[im 1/29]
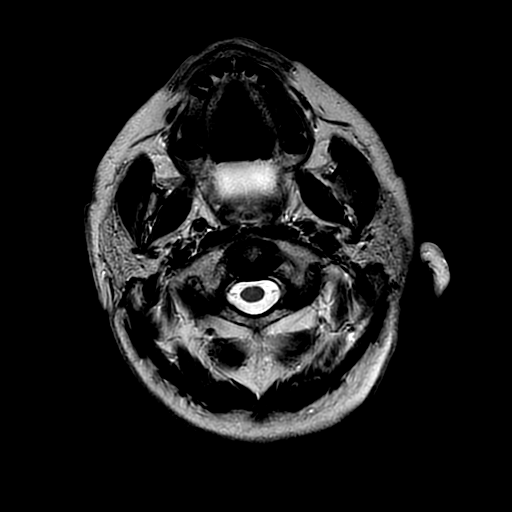

[Series 7: FLAIR · axial · 3.0mm · 0.47mm/px · 1 of 29 slices shown (2 of 2)]
[im 1/29]
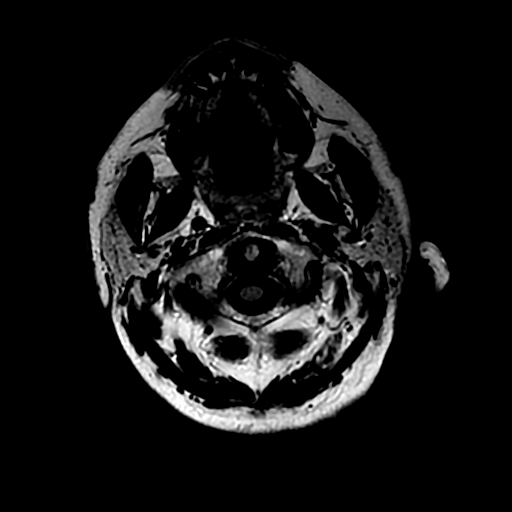

[Series 8: SWI · axial · 3.0mm · 0.47mm/px · z∈[-104,+69]mm · 4 of 116 slices shown]
[im 1/116]
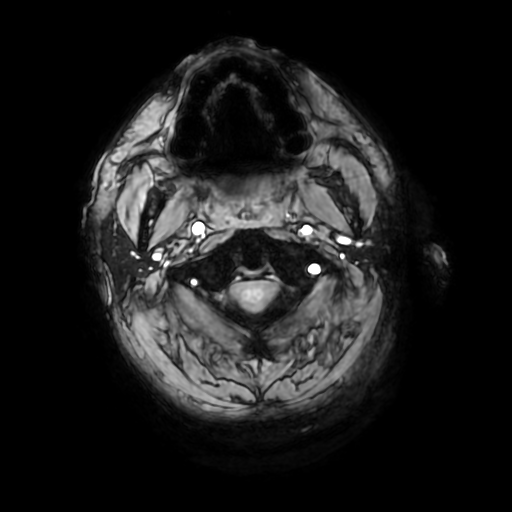
[im 39/116]
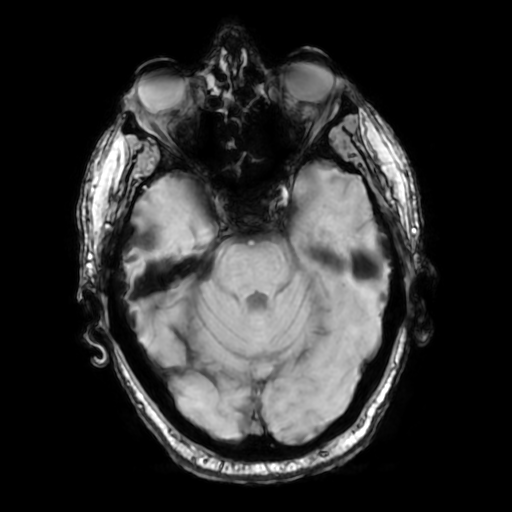
[im 77/116]
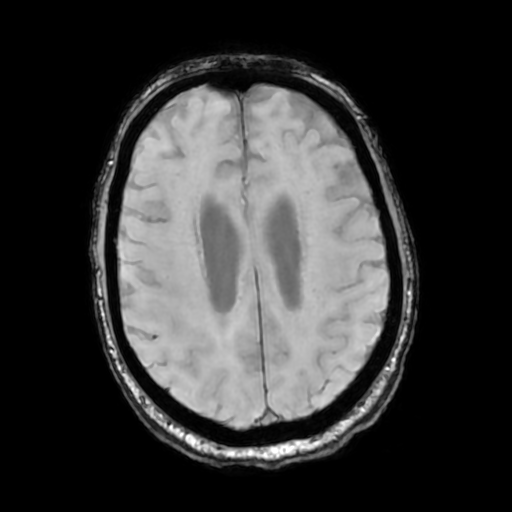
[im 116/116]
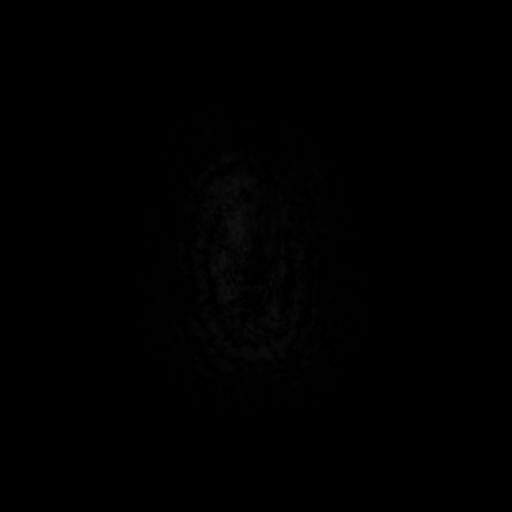

[Series 9: T1 · axial · non-contrast · 3.0mm · 0.94mm/px · z∈[-104,+67]mm · 2 of 58 slices shown]
[im 1/58]
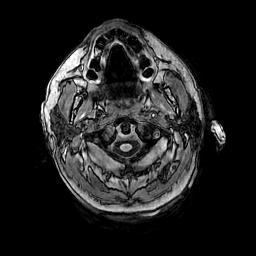
[im 58/58]
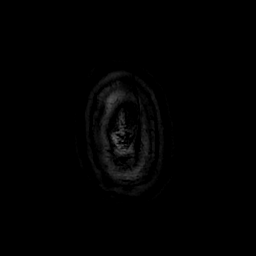

[Series 10: T2 post-contrast · coronal · 5.0mm · 0.47mm/px · 1 of 32 slices shown]
[im 1/32]
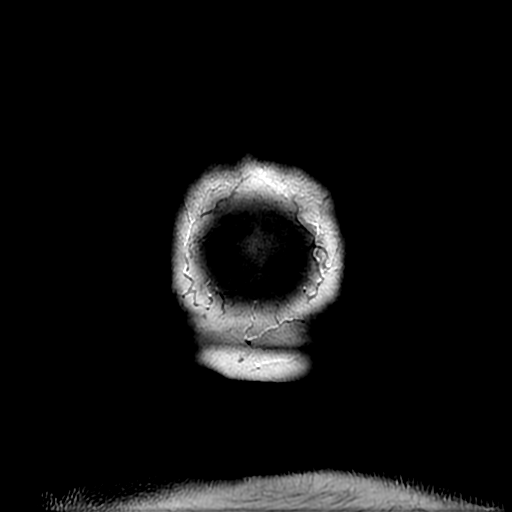

[Series 250: ADC · axial · 3.0mm · 0.94mm/px · z∈[-123,+57]mm · 2 of 61 slices shown (1 of 2)]
[im 1/61]
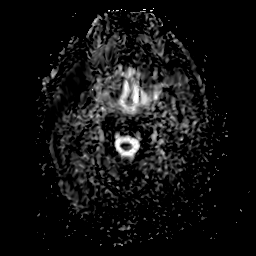
[im 61/61]
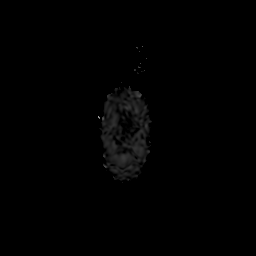

[Series 450: ADC · coronal · 4.0mm · 0.94mm/px · 1 of 38 slices shown (2 of 2)]
[im 1/38]
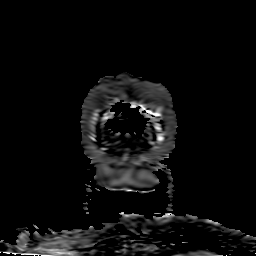

[27 of 48 positions shown; findings below may reference images not displayed]

MR
head without contrast [DATE]. Studies performed [DATE] were
done at [facility] [REDACTED]
FINDINGS: MRI HEAD FINDINGS

Brain: No acute infarct, hemorrhage, or mass lesion is present. Mild
atrophy and moderate diffuse white matter disease is present
bilaterally. There is volume loss with ex vacuo dilation of the
lateral ventricles. No significant extraaxial fluid collection is
present. Brainstem and cerebellum are within normal limits.

Vascular: Flow is present in the major intracranial arteries.

Skull and upper cervical spine: The craniocervical junction is
normal. Upper cervical spine is within normal limits. Marrow signal
is unremarkable.

Sinuses/Orbits: Scattered ethmoid opacification is similar the prior
study. Mild mucosal thickening is present in the frontal sinuses
bilaterally. The paranasal sinuses and mastoid air cells are
otherwise clear. Bilateral lens replacements are noted. Globes and
orbits are otherwise unremarkable.

MRA HEAD FINDINGS

The internal carotid arteries are within normal limits from the high
cervical segments through the ICA termini bilaterally. The A1 and M1
segments are normal. The anterior communicating artery is present.
ACA and MCA branch vessels are within normal limits.

MRA NECK FINDINGS

Time-of-flight images demonstrate normal flow in the carotid
arteries bilaterally. There is no significant flow disturbance at
either carotid bifurcation. Flow is antegrade in the vertebral
arteries bilaterally. The left vertebral artery is dominant.
IMPRESSION: 1. No acute intracranial abnormality.
2. Stable diffuse white matter disease, moderately advanced for age.
3. Normal MRA circle-of-Willis without significant proximal
stenosis, aneurysm, or branch vessel occlusion.
4. Normal MRA of the neck without contrast. No significant stenosis.

## 2019-07-23 IMAGING — DX DG CHEST 1V PORT
1 series · 1 of 1 positions shown · non-contrast
Comparison: Chest radiograph [DATE], [DATE]

CLINICAL DATA: Altered Mental Status, ? Stroke, Sleep Apnea. Tech
wore mask, face shield, and gloves. Patient wore a mask.

EXAM:
PORTABLE CHEST 1 VIEW

[chest]
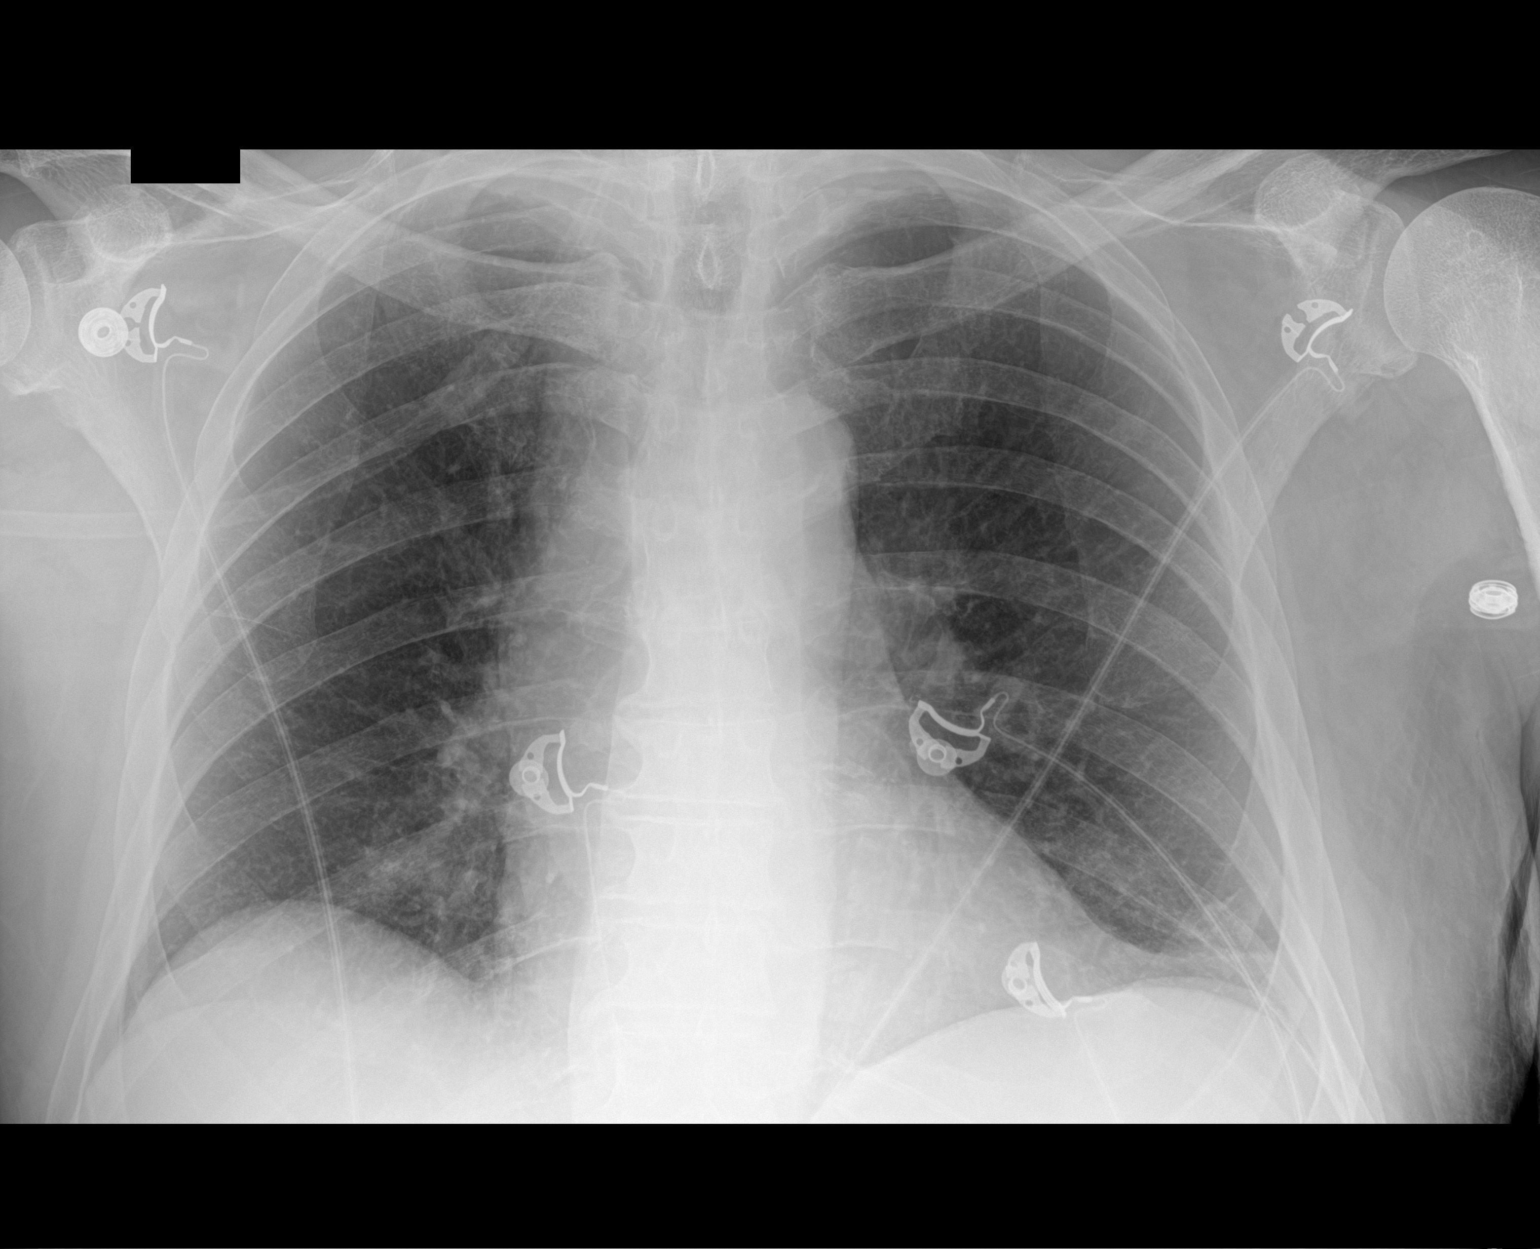

[1 of 1 positions shown; findings below may reference images not displayed]

FINDINGS: Stable cardiomediastinal contours. Minimal opacity at the left base
likely atelectasis or scarring. No focal infiltrate. No pneumothorax
or large pleural effusion. No acute finding in the visualized
skeleton.
IMPRESSION: Minimal left basilar atelectasis or scarring. No other acute
finding.

## 2019-07-23 IMAGING — DX DG CHEST 1V PORT
2 series · 2 of 2 positions shown · non-contrast
Comparison: Chest radiograph [DATE] at [DATE] p.m.

CLINICAL DATA: Post intubation Second image to confirm OG tube
placement

EXAM:
PORTABLE CHEST 1 VIEW

[chest ap (1 of 2)]
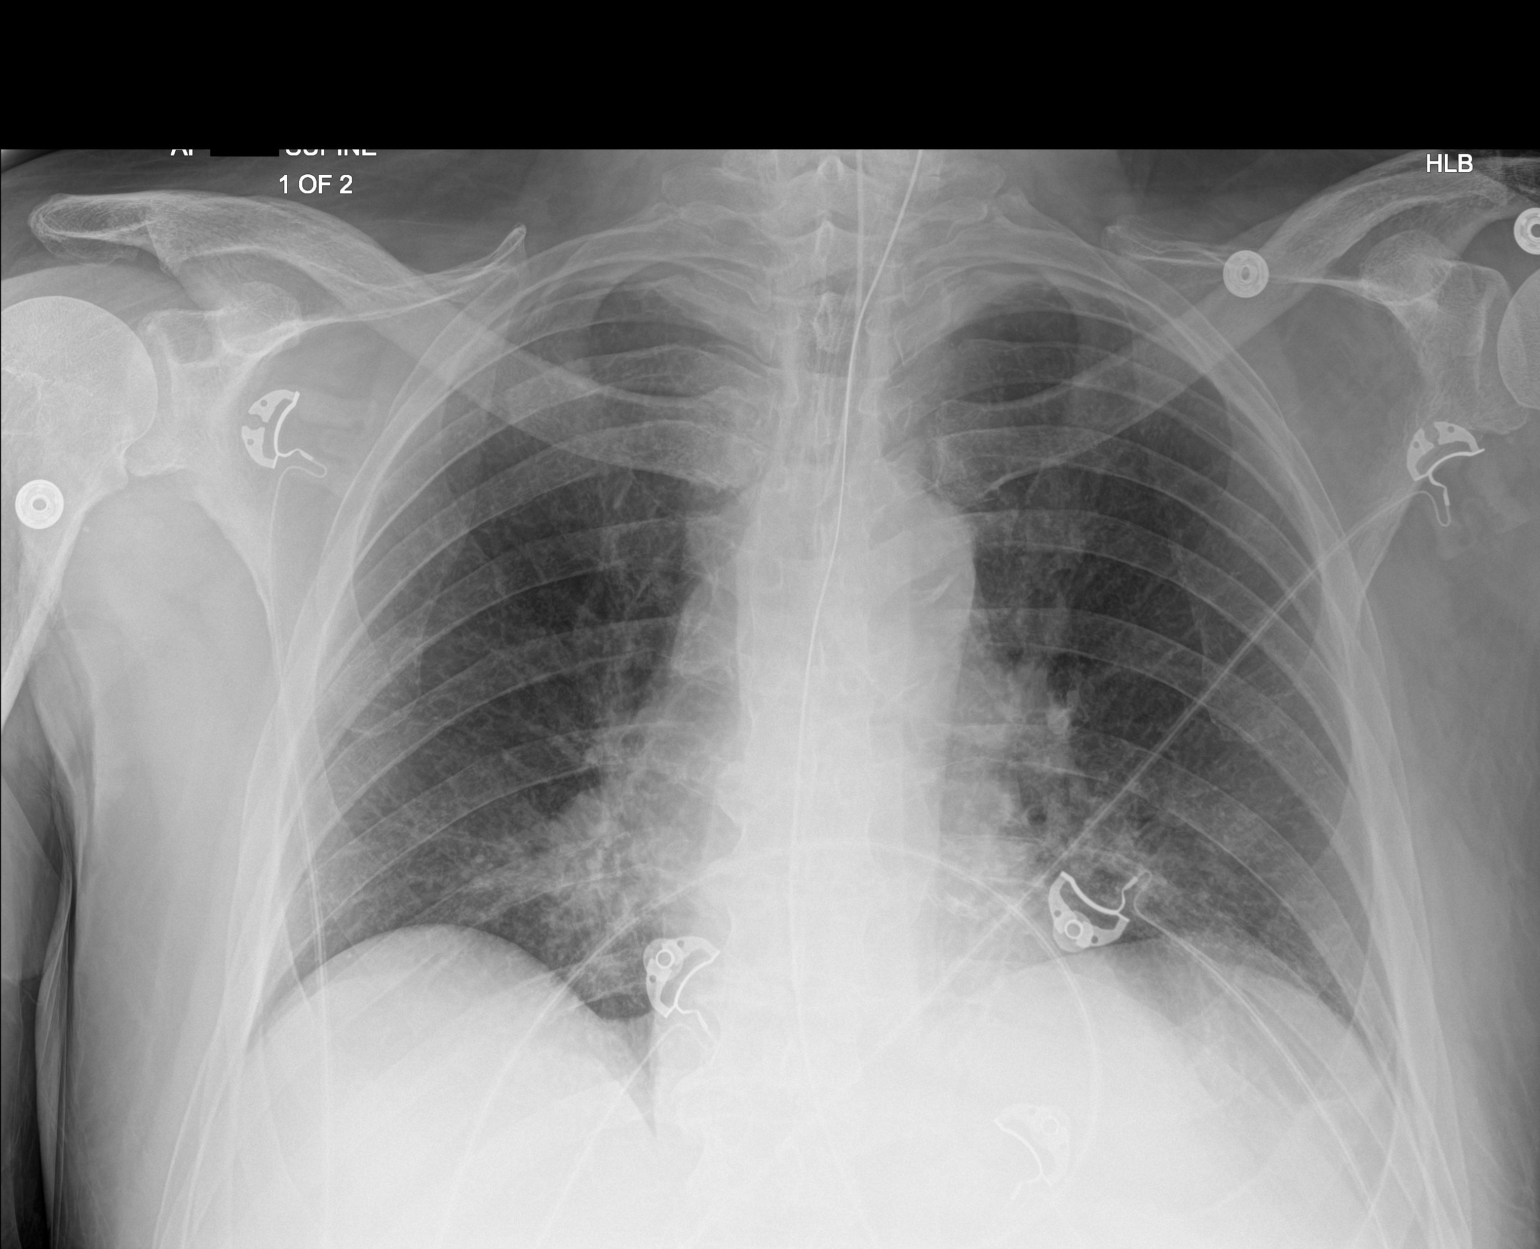

[chest ap (2 of 2)]
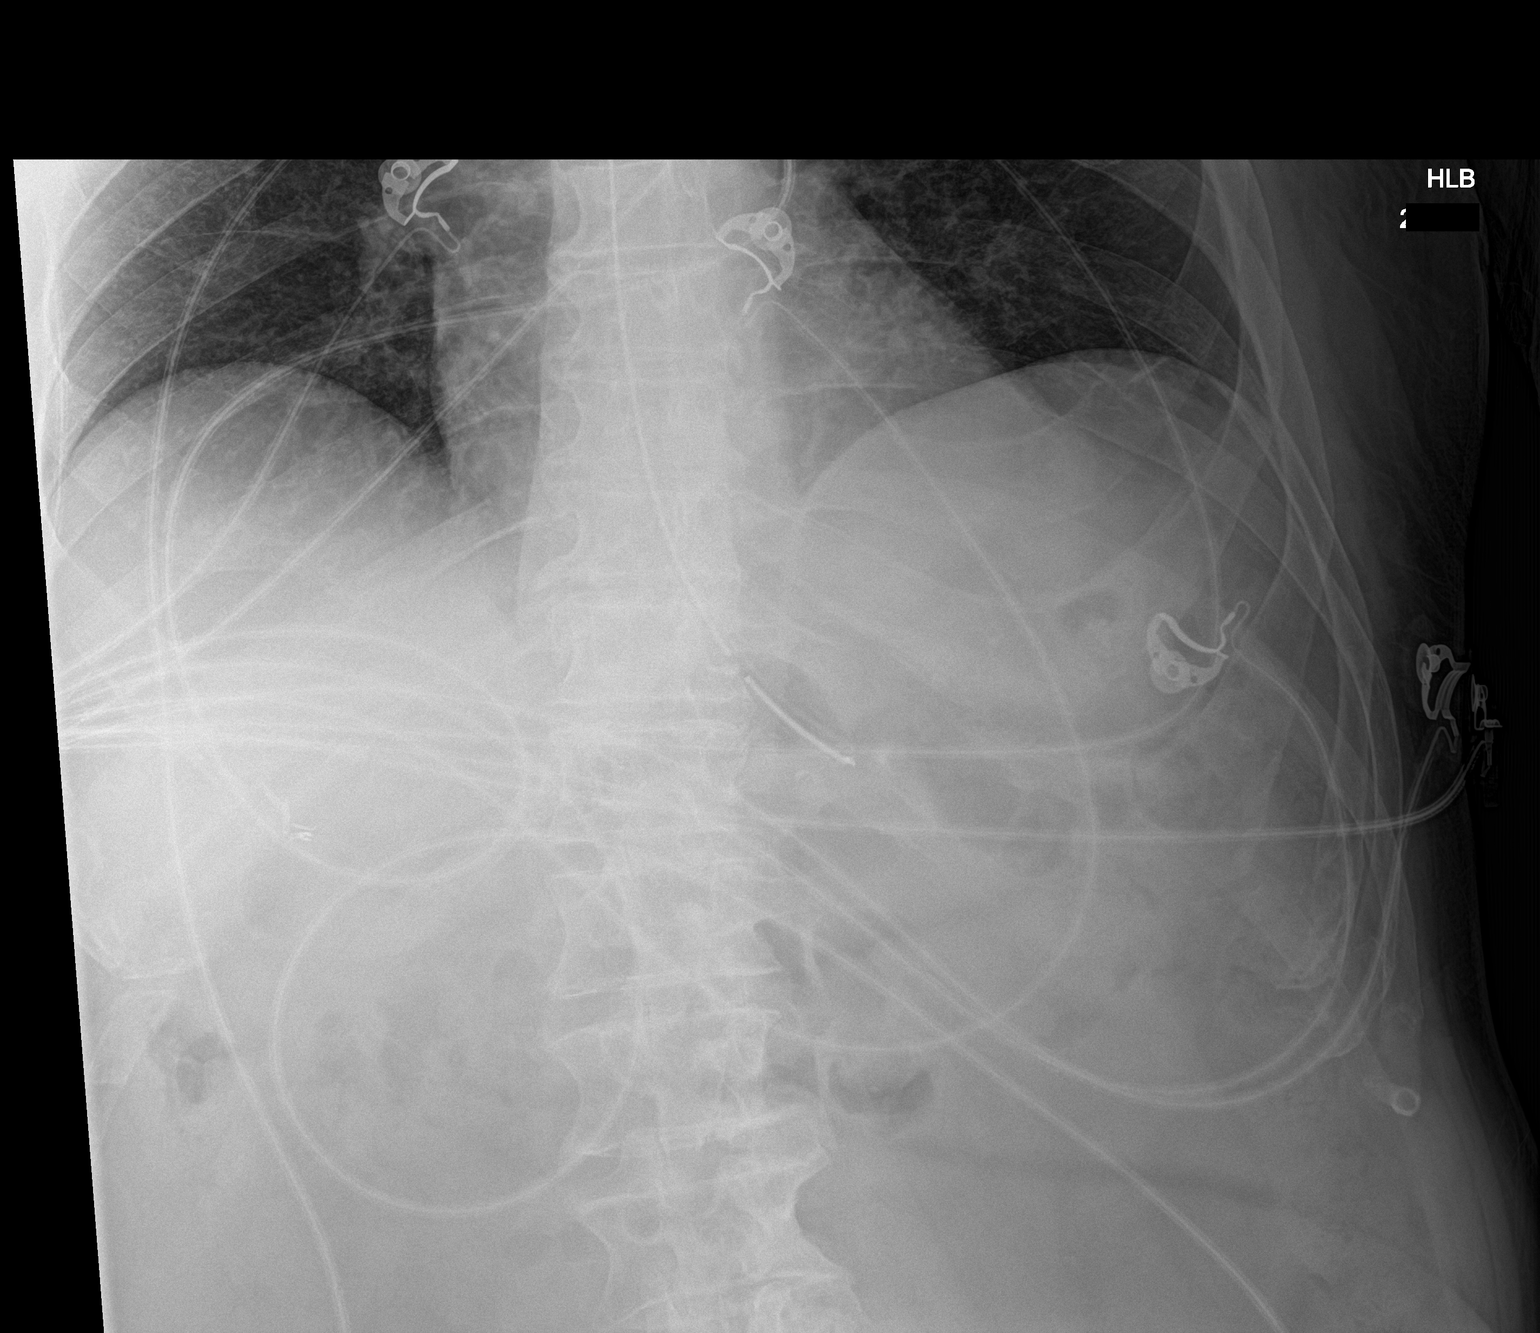

[2 of 2 positions shown; findings below may reference images not displayed]

FINDINGS: Status post placement of an endotracheal tube with tip terminating
between the thoracic inlet and carina. Interval placement of a
nasogastric tube with side port projecting the at or just beyond the
GE junction.

Stable cardiomediastinal contours. Low lung volumes without new
focal infiltrate. No pneumothorax or large pleural effusion.
Nonobstructive bowel gas pattern.
IMPRESSION: 1. Satisfactory placement of the endotracheal tube terminating
between the thoracic inlet and carina.

2. Interval placement of nasogastric tube with side port projecting
at or just beyond the GE junction. Recommend short advancement into
the stomach.

## 2019-07-23 IMAGING — MR MR HEAD W/O CM
11 of 13 series · 27 of 48 positions shown · non-contrast
Comparison: CT head without contrast [DATE] in [DATE].

CLINICAL DATA: Altered level of consciousness (LOC), unexplained
Unresponsive, weakness in left arm and left leg compared to right.;
Stroke, follow up



[Series 2: DWI · axial · 3.0mm · 0.94mm/px · z∈[-123,+57]mm · 5 of 122 slices shown (1 of 2)]
[im 1/122]
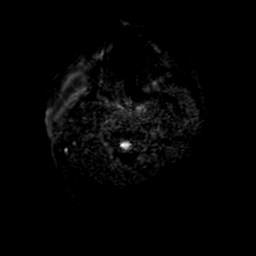
[im 31/122]
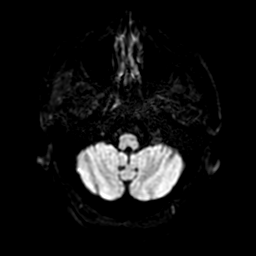
[im 61/122]
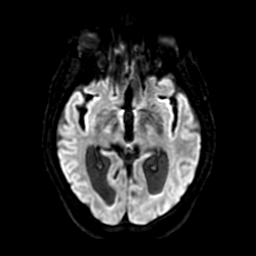
[im 91/122]
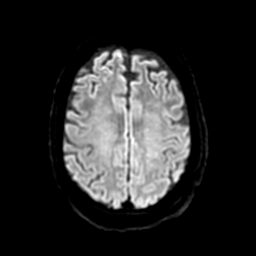
[im 122/122]
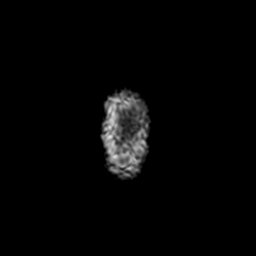

[Series 3: ax (id) · axial · 1.0mm · 0.43mm/px · z∈[-76,+0]mm · 6 of 184 slices shown]
[im 1/184]
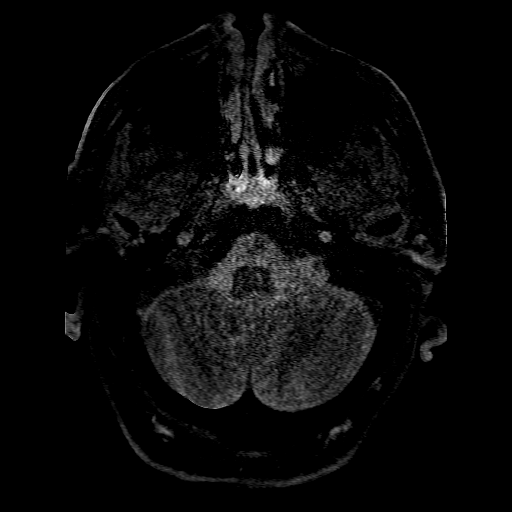
[im 31/184]
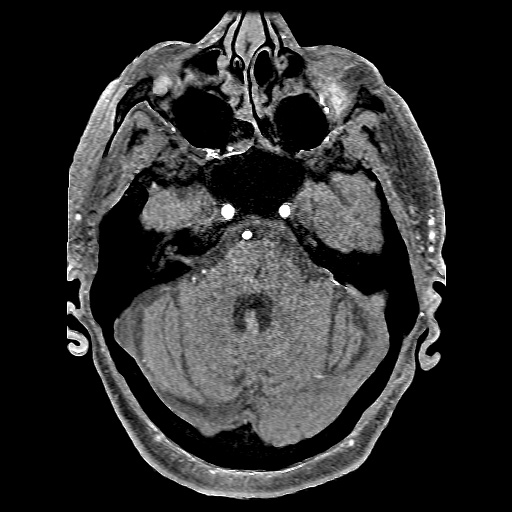
[im 62/184]
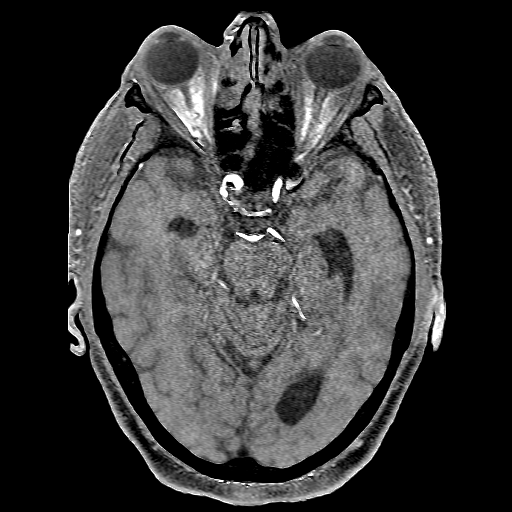
[im 92/184]
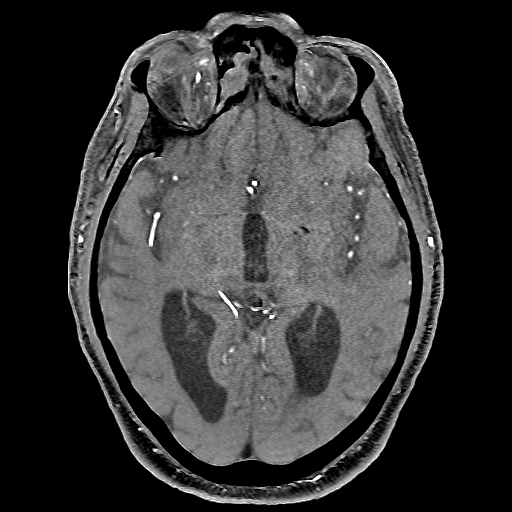
[im 123/184]
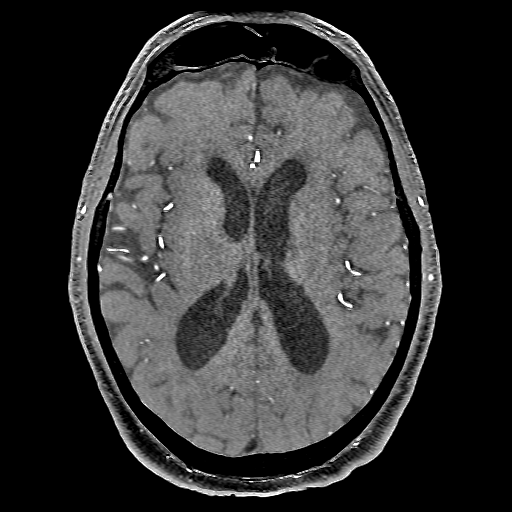
[im 153/184]
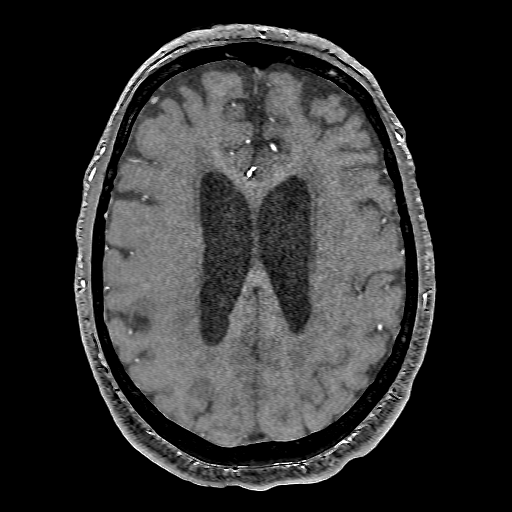

[Series 4: DWI · coronal · 4.0mm · 0.94mm/px · 3 of 76 slices shown (2 of 2)]
[im 1/76]
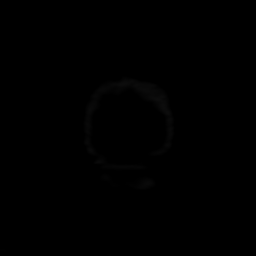
[im 38/76]
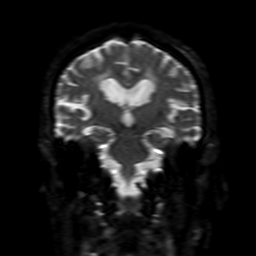
[im 76/76]
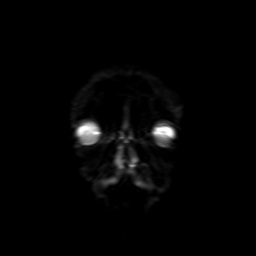

[Series 5: FLAIR · sagittal · 5.0mm · 0.47mm/px · 1 of 27 slices shown (1 of 2)]
[im 1/27]
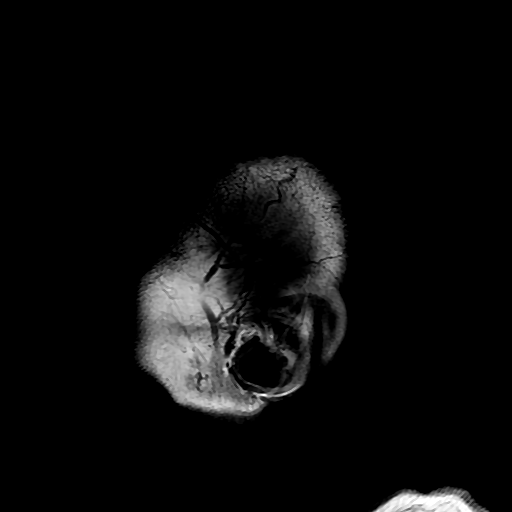

[Series 6: T2 · axial · 5.0mm · 0.47mm/px · 1 of 29 slices shown]
[im 1/29]
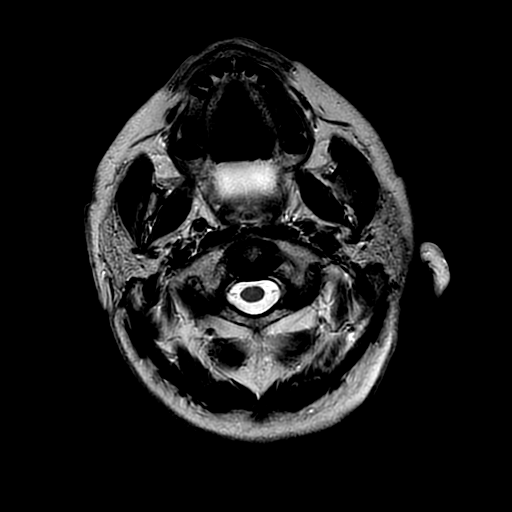

[Series 7: FLAIR · axial · 3.0mm · 0.47mm/px · 1 of 29 slices shown (2 of 2)]
[im 1/29]
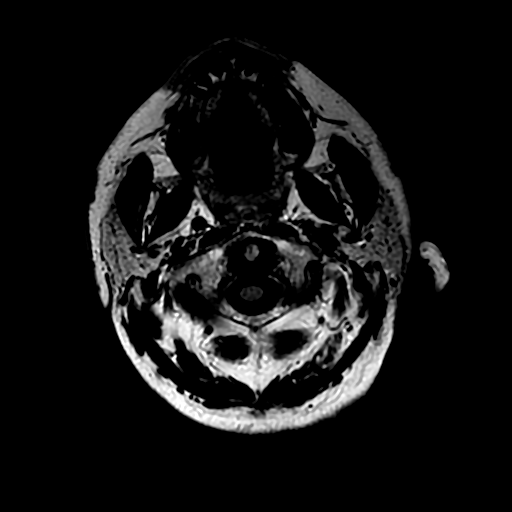

[Series 8: SWI · axial · 3.0mm · 0.47mm/px · z∈[-104,+69]mm · 4 of 116 slices shown]
[im 1/116]
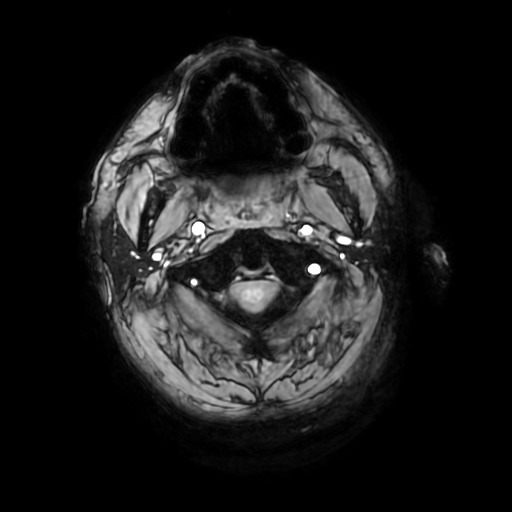
[im 39/116]
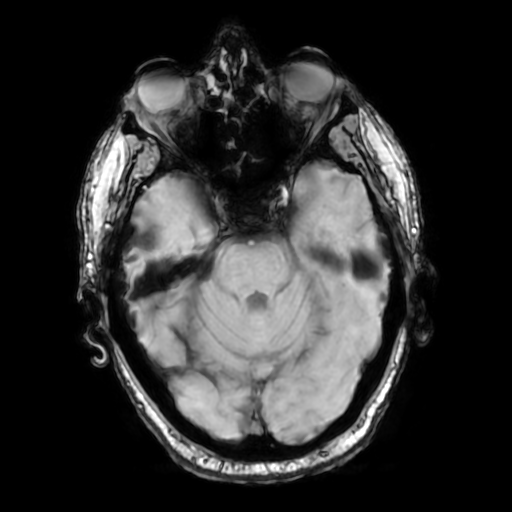
[im 77/116]
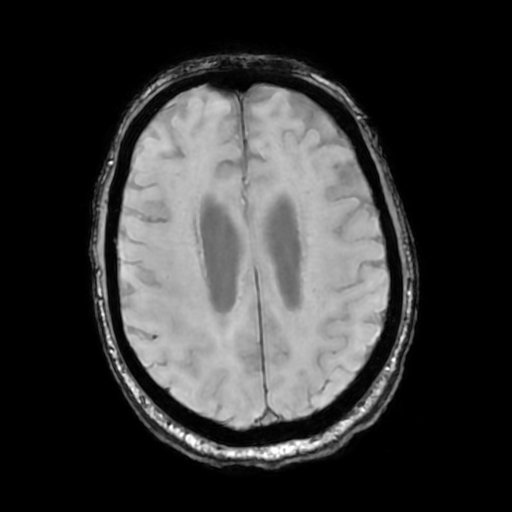
[im 116/116]
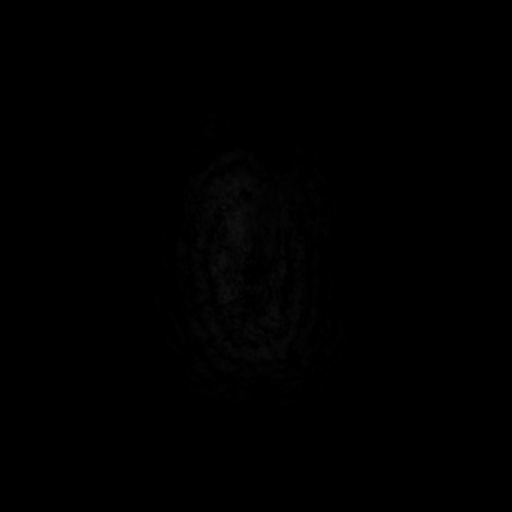

[Series 9: T1 · axial · non-contrast · 3.0mm · 0.94mm/px · z∈[-104,+67]mm · 2 of 58 slices shown]
[im 1/58]
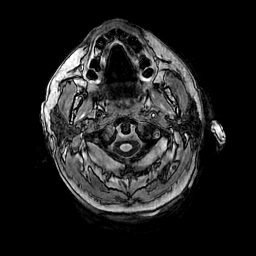
[im 58/58]
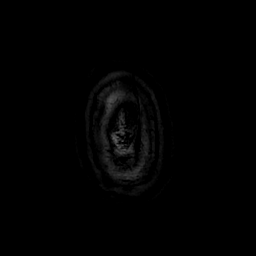

[Series 10: T2 post-contrast · coronal · 5.0mm · 0.47mm/px · 1 of 32 slices shown]
[im 1/32]
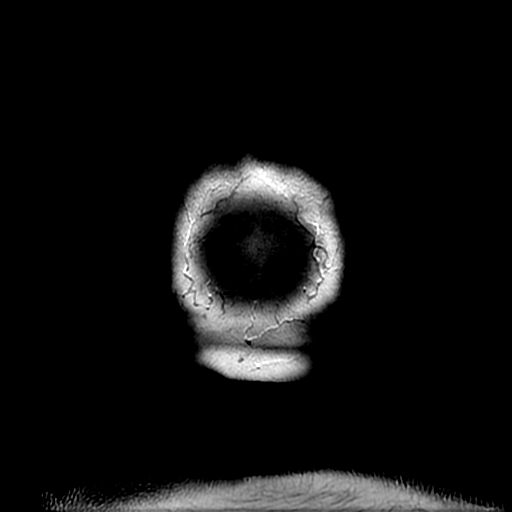

[Series 250: ADC · axial · 3.0mm · 0.94mm/px · z∈[-123,+57]mm · 2 of 61 slices shown (1 of 2)]
[im 1/61]
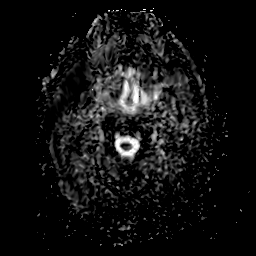
[im 61/61]
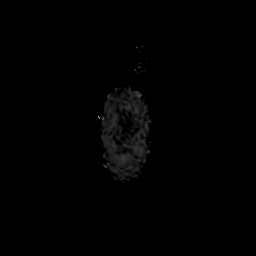

[Series 450: ADC · coronal · 4.0mm · 0.94mm/px · 1 of 38 slices shown (2 of 2)]
[im 1/38]
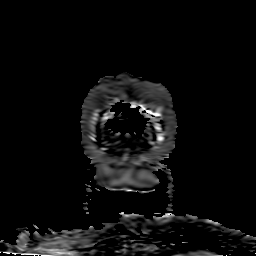

[27 of 48 positions shown; findings below may reference images not displayed]

MR
head without contrast [DATE]. Studies performed [DATE] were
done at [facility] [RED[facility]ED]
FINDINGS: MRI HEAD FINDINGS

Brain: No acute infarct, hemorrhage, or mass lesion is present. Mild
atrophy and moderate diffuse white matter disease is present
bilaterally. There is volume loss with ex vacuo dilation of the
lateral ventricles. No significant extraaxial fluid collection is
present. Brainstem and cerebellum are within normal limits.

Vascular: Flow is present in the major intracranial arteries.

Skull and upper cervical spine: The craniocervical junction is
normal. Upper cervical spine is within normal limits. Marrow signal
is unremarkable.

Sinuses/Orbits: Scattered ethmoid opacification is similar the prior
study. Mild mucosal thickening is present in the frontal sinuses
bilaterally. The paranasal sinuses and mastoid air cells are
otherwise clear. Bilateral lens replacements are noted. Globes and
orbits are otherwise unremarkable.

MRA HEAD FINDINGS

The internal carotid arteries are within normal limits from the high
cervical segments through the ICA termini bilaterally. The A1 and M1
segments are normal. The anterior communicating artery is present.
ACA and MCA branch vessels are within normal limits.

MRA NECK FINDINGS

Time-of-flight images demonstrate normal flow in the carotid
arteries bilaterally. There is no significant flow disturbance at
either carotid bifurcation. Flow is antegrade in the vertebral
arteries bilaterally. The left vertebral artery is dominant.
IMPRESSION: 1. No acute intracranial abnormality.
2. Stable diffuse white matter disease, moderately advanced for age.
3. Normal MRA circle-of-Willis without significant proximal
stenosis, aneurysm, or branch vessel occlusion.
4. Normal MRA of the neck without contrast. No significant stenosis.

## 2019-07-23 IMAGING — CT CT CERVICAL SPINE W/O CM
3 of 4 series · 13 of 33 positions shown, 16 images · non-contrast
Comparison: None.

CLINICAL DATA: Patient found down this morning. Altered mental
status. Initial encounter.

EXAM:
CT CERVICAL SPINE WITHOUT CONTRAST
TECHNIQUE: Multidetector CT imaging of the cervical spine was performed without
intravenous contrast. Multiplanar CT image reconstructions were also
generated.

[Series 6: c_spine 2.0 sag bone · sagittal · 0.28mm/px · 5 of 61 slices shown, 6 images]
[im 21/61  bone]
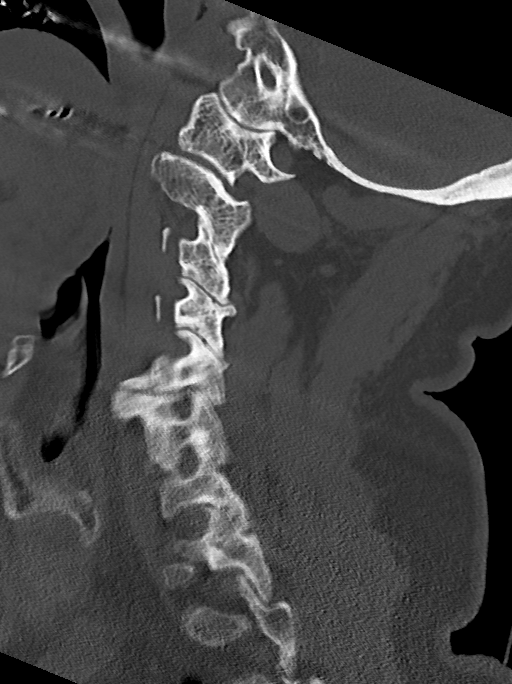
[im 26/61  bone]
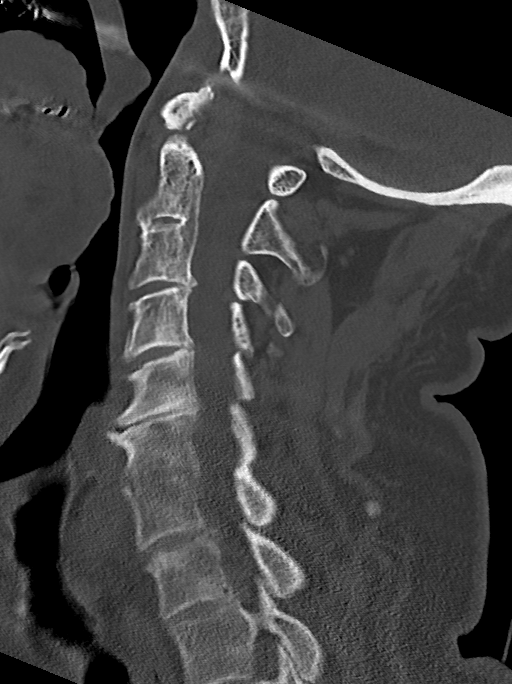
[im 31/61  soft-tissue]
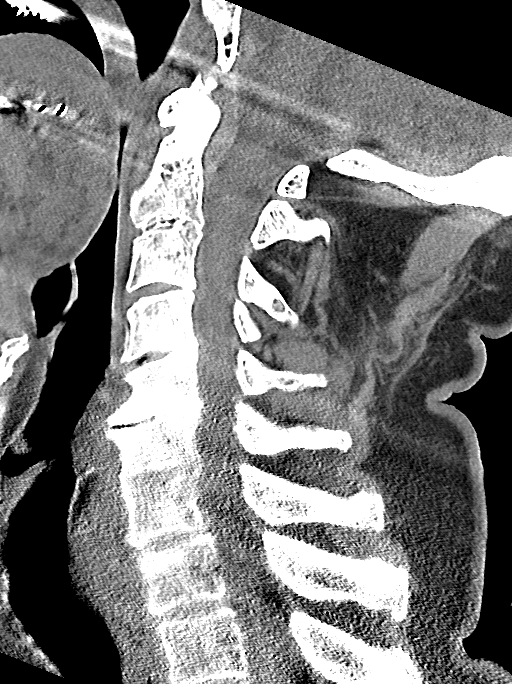
[im 31/61  bone]
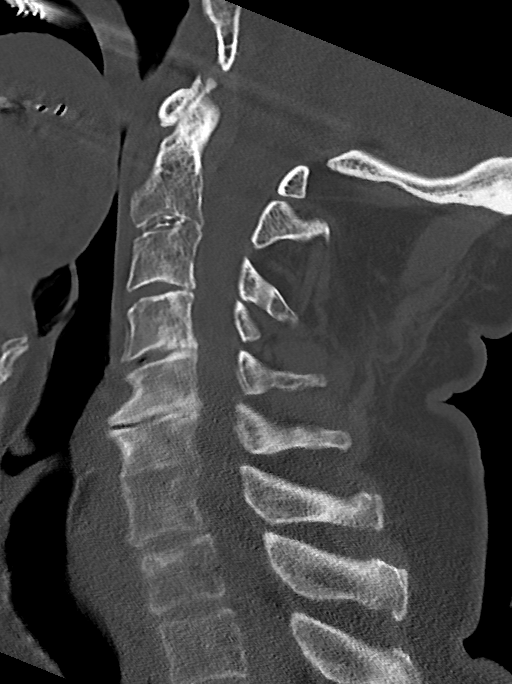
[im 36/61  bone]
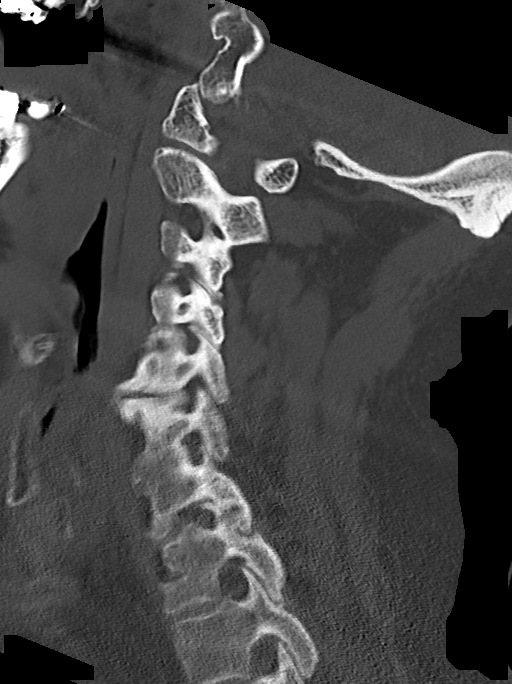
[im 41/61  bone]
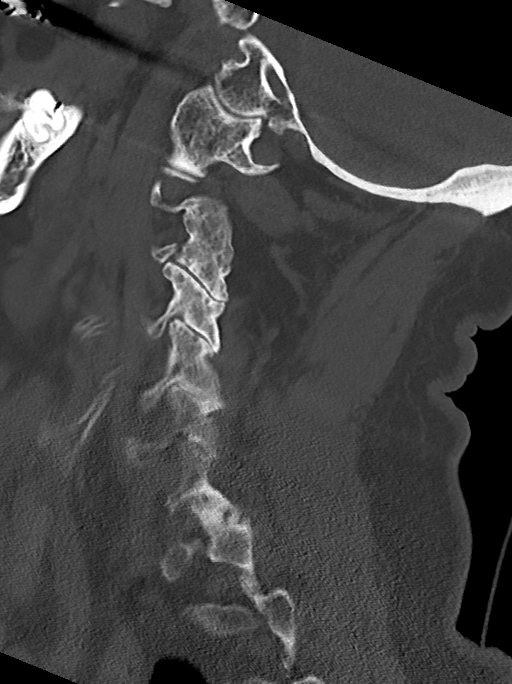

[Series 7: c_spine 2.0 cor bone · coronal · 0.28mm/px · 3 of 66 slices shown]
[im 14/66  bone]
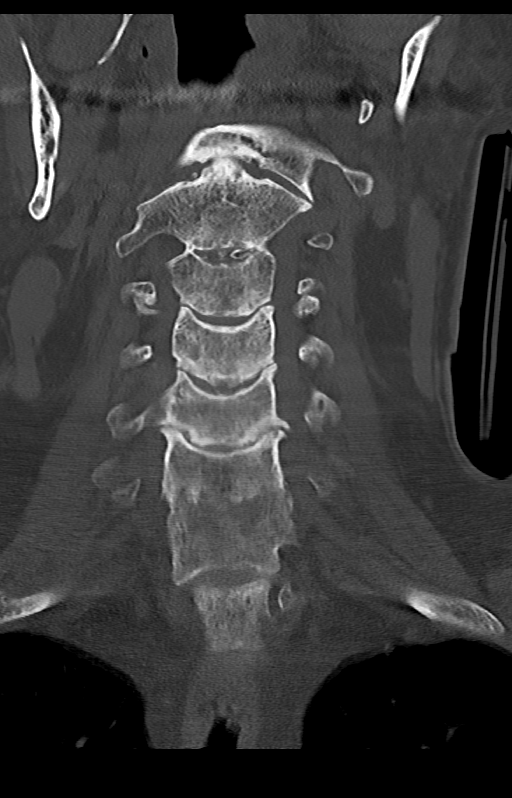
[im 27/66  bone]
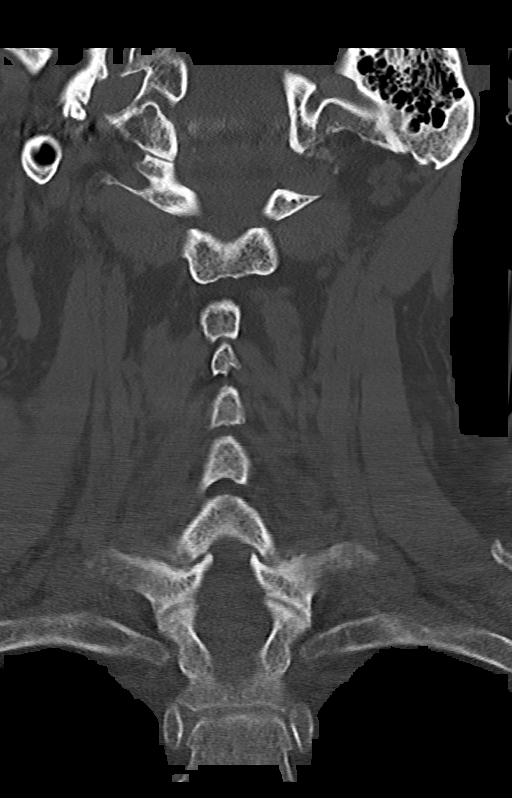
[im 40/66  bone]
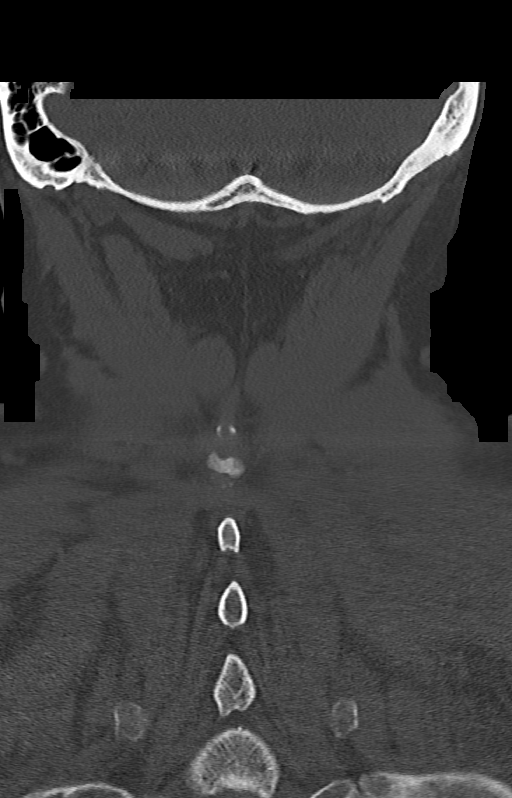

[Series 9: c_spine 1.0 st thins · axial · 0.40mm/px · z∈[-301,-154]mm · 5 of 273 slices shown, 7 images]
[im 31/273  soft-tissue]
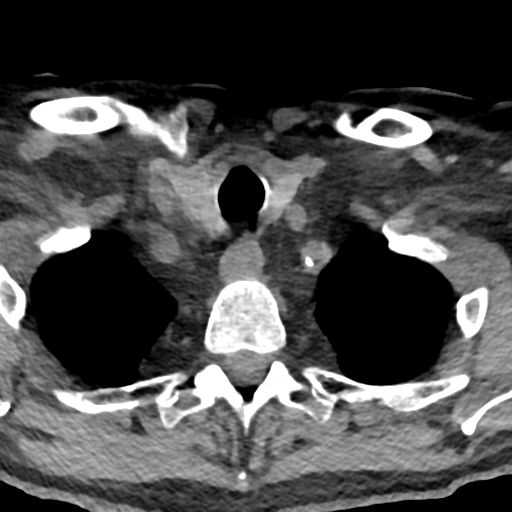
[im 31/273  bone]
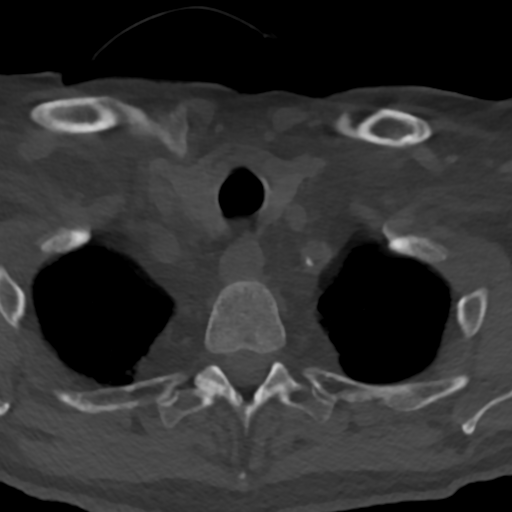
[im 91/273  bone]
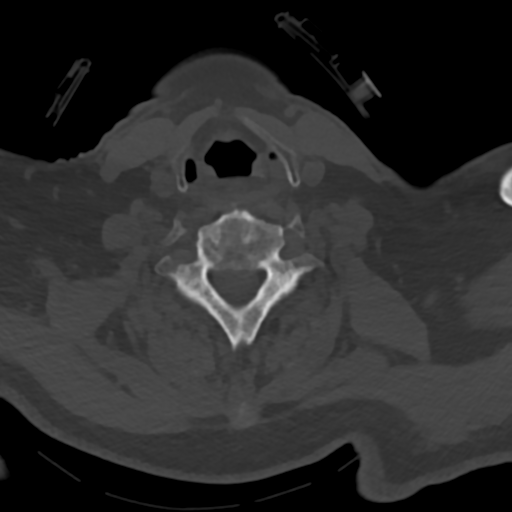
[im 152/273  bone]
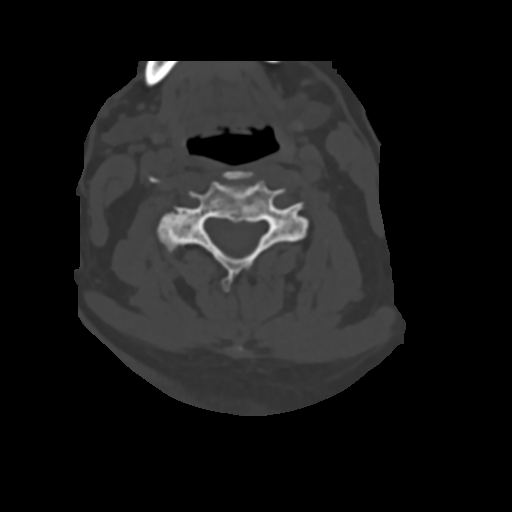
[im 182/273  bone]
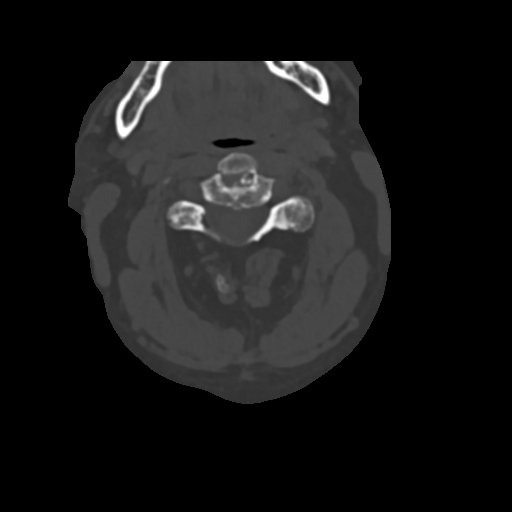
[im 242/273  soft-tissue]
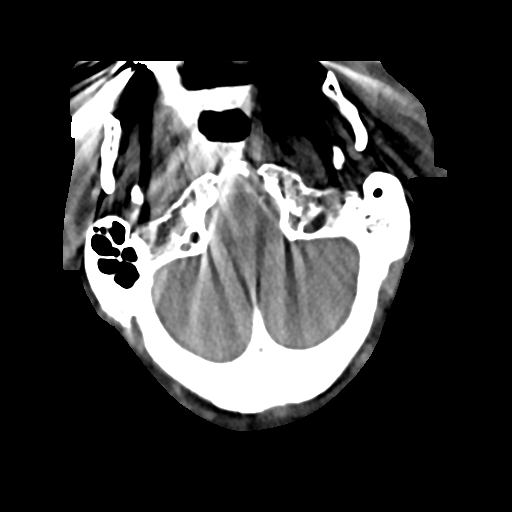
[im 242/273  bone]
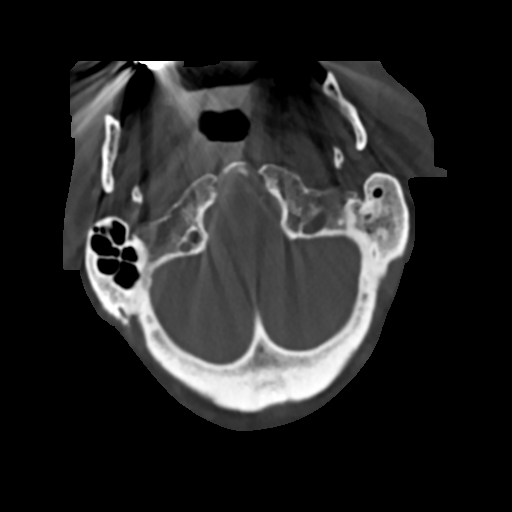

[13 of 33 positions shown; findings below may reference images not displayed]

FINDINGS: Alignment: Maintained.

Skull base and vertebrae: No acute fracture. No primary bone lesion
or focal pathologic process.

Soft tissues and spinal canal: Negative.

Disc levels: Marked loss of disc space height is seen at C5-6. The
C6-7 level is fused.

Upper chest: Lung apices clear.

Other: None.
IMPRESSION: No acute abnormality.

Degenerative disc disease C5-6.

## 2019-07-23 MED ORDER — CLOPIDOGREL BISULFATE 75 MG PO TABS
75.0000 mg | ORAL_TABLET | Freq: Every day | ORAL | Status: DC
  Administered 2019-07-23 – 2019-07-27 (×5): 75 mg via ORAL
  Filled 2019-07-23 (×5): qty 1

## 2019-07-23 MED ORDER — PROPOFOL 1000 MG/100ML IV EMUL
INTRAVENOUS | Status: AC
  Filled 2019-07-23: qty 100

## 2019-07-23 MED ORDER — DEXMEDETOMIDINE HCL IN NACL 400 MCG/100ML IV SOLN
0.4000 ug/kg/h | INTRAVENOUS | Status: DC
  Administered 2019-07-23: 18:00:00 0.5 ug/kg/h via INTRAVENOUS
  Filled 2019-07-23: qty 100

## 2019-07-23 MED ORDER — ROCURONIUM BROMIDE 50 MG/5ML IV SOLN
INTRAVENOUS | Status: AC | PRN
  Administered 2019-07-23: 50 mg via INTRAVENOUS

## 2019-07-23 MED ORDER — HYDRALAZINE HCL 20 MG/ML IJ SOLN
10.0000 mg | INTRAMUSCULAR | Status: DC | PRN
  Administered 2019-07-23 – 2019-07-24 (×2): 10 mg via INTRAVENOUS
  Filled 2019-07-23 (×2): qty 1

## 2019-07-23 MED ORDER — ASPIRIN 81 MG PO CHEW
81.0000 mg | CHEWABLE_TABLET | Freq: Every day | ORAL | Status: DC
  Administered 2019-07-23 – 2019-07-24 (×2): 81 mg
  Filled 2019-07-23 (×2): qty 1

## 2019-07-23 MED ORDER — ENOXAPARIN SODIUM 40 MG/0.4ML ~~LOC~~ SOLN
40.0000 mg | SUBCUTANEOUS | Status: DC
  Filled 2019-07-23: qty 0.4

## 2019-07-23 MED ORDER — SOTALOL HCL 80 MG PO TABS
80.0000 mg | ORAL_TABLET | Freq: Two times a day (BID) | ORAL | Status: DC
  Administered 2019-07-23 – 2019-07-24 (×3): 80 mg
  Filled 2019-07-23 (×6): qty 1

## 2019-07-23 MED ORDER — FENTANYL CITRATE (PF) 100 MCG/2ML IJ SOLN
25.0000 ug | INTRAMUSCULAR | Status: DC | PRN
  Administered 2019-07-23: 50 ug via INTRAVENOUS
  Administered 2019-07-23: 25 ug via INTRAVENOUS
  Administered 2019-07-24 (×2): 50 ug via INTRAVENOUS
  Filled 2019-07-23 (×4): qty 2

## 2019-07-23 MED ORDER — HEPARIN (PORCINE) 25000 UT/250ML-% IV SOLN
1300.0000 [IU]/h | INTRAVENOUS | Status: DC
  Administered 2019-07-23: 1300 [IU]/h via INTRAVENOUS
  Filled 2019-07-23: qty 250

## 2019-07-23 MED ORDER — CHLORHEXIDINE GLUCONATE CLOTH 2 % EX PADS
6.0000 | MEDICATED_PAD | Freq: Every day | CUTANEOUS | Status: DC
  Administered 2019-07-23 – 2019-07-26 (×4): 6 via TOPICAL

## 2019-07-23 MED ORDER — LACTATED RINGERS IV BOLUS: 1000.0000 mL | Freq: Once | INTRAVENOUS | Status: DC

## 2019-07-23 MED ORDER — FENTANYL CITRATE (PF) 100 MCG/2ML IJ SOLN
25.0000 ug | INTRAMUSCULAR | Status: DC | PRN
  Administered 2019-07-23: 25 ug via INTRAVENOUS
  Filled 2019-07-23: qty 2

## 2019-07-23 MED ORDER — VANCOMYCIN HCL 10 G IV SOLR
1250.0000 mg | INTRAVENOUS | Status: DC
  Administered 2019-07-23: 1250 mg via INTRAVENOUS
  Filled 2019-07-23 (×4): qty 1250

## 2019-07-23 MED ORDER — PROPOFOL 1000 MG/100ML IV EMUL
5.0000 ug/kg/min | INTRAVENOUS | Status: DC
  Administered 2019-07-23: 40 ug/kg/min via INTRAVENOUS
  Filled 2019-07-23: qty 100

## 2019-07-23 MED ORDER — ACETAMINOPHEN 325 MG PO TABS
650.0000 mg | ORAL_TABLET | ORAL | Status: DC | PRN
  Administered 2019-07-25 – 2019-07-27 (×5): 650 mg via ORAL
  Filled 2019-07-23 (×5): qty 2

## 2019-07-23 MED ORDER — IPRATROPIUM-ALBUTEROL 0.5-2.5 (3) MG/3ML IN SOLN
3.0000 mL | Freq: Four times a day (QID) | RESPIRATORY_TRACT | Status: DC
  Administered 2019-07-23 – 2019-07-24 (×4): 3 mL via RESPIRATORY_TRACT
  Filled 2019-07-23 (×3): qty 3

## 2019-07-23 MED ORDER — ETOMIDATE 2 MG/ML IV SOLN
INTRAVENOUS | Status: AC | PRN
  Administered 2019-07-23: 20 mg via INTRAVENOUS

## 2019-07-23 MED ORDER — PIPERACILLIN-TAZOBACTAM 3.375 G IVPB: 3.3750 g | Freq: Three times a day (TID) | INTRAVENOUS | Status: DC

## 2019-07-23 MED ORDER — ORAL CARE MOUTH RINSE
15.0000 mL | OROMUCOSAL | Status: DC
  Administered 2019-07-23 – 2019-07-24 (×5): 15 mL via OROMUCOSAL

## 2019-07-23 MED ORDER — LACTATED RINGERS IV BOLUS
30.0000 mL/kg | Freq: Once | INTRAVENOUS | Status: AC
  Administered 2019-07-23: 2862 mL via INTRAVENOUS

## 2019-07-23 MED ORDER — INSULIN ASPART 100 UNIT/ML ~~LOC~~ SOLN
0.0000 [IU] | SUBCUTANEOUS | Status: DC
  Administered 2019-07-23: 3 [IU] via SUBCUTANEOUS
  Administered 2019-07-23: 2 [IU] via SUBCUTANEOUS
  Administered 2019-07-24: 5 [IU] via SUBCUTANEOUS
  Administered 2019-07-24 (×6): 2 [IU] via SUBCUTANEOUS
  Administered 2019-07-25: 5 [IU] via SUBCUTANEOUS
  Administered 2019-07-25: 17:00:00 3 [IU] via SUBCUTANEOUS
  Administered 2019-07-25: 2 [IU] via SUBCUTANEOUS
  Administered 2019-07-25: 3 [IU] via SUBCUTANEOUS
  Administered 2019-07-25: 2 [IU] via SUBCUTANEOUS
  Administered 2019-07-26: 5 [IU] via SUBCUTANEOUS
  Administered 2019-07-26 (×2): 3 [IU] via SUBCUTANEOUS
  Administered 2019-07-26: 2 [IU] via SUBCUTANEOUS
  Administered 2019-07-26: 17:00:00 3 [IU] via SUBCUTANEOUS
  Administered 2019-07-26 – 2019-07-27 (×2): 2 [IU] via SUBCUTANEOUS
  Administered 2019-07-27: 3 [IU] via SUBCUTANEOUS

## 2019-07-23 MED ORDER — PIPERACILLIN-TAZOBACTAM 3.375 G IVPB
3.3750 g | Freq: Three times a day (TID) | INTRAVENOUS | Status: DC
  Administered 2019-07-23 – 2019-07-24 (×2): 3.375 g via INTRAVENOUS
  Filled 2019-07-23 (×3): qty 50

## 2019-07-23 MED ORDER — FAMOTIDINE 40 MG/5ML PO SUSR
20.0000 mg | Freq: Two times a day (BID) | ORAL | Status: DC
  Administered 2019-07-23 (×2): 20 mg
  Filled 2019-07-23 (×2): qty 2.5

## 2019-07-23 MED ORDER — PROPOFOL 1000 MG/100ML IV EMUL
INTRAVENOUS | Status: AC | PRN
  Administered 2019-07-23: 10 ug/kg/min via INTRAVENOUS

## 2019-07-23 MED ORDER — CHLORHEXIDINE GLUCONATE 0.12% ORAL RINSE (MEDLINE KIT)
15.0000 mL | Freq: Two times a day (BID) | OROMUCOSAL | Status: DC
  Administered 2019-07-23 – 2019-07-24 (×2): 15 mL via OROMUCOSAL

## 2019-07-23 NOTE — Progress Notes (Signed)
Pharmacy Antibiotic Note  Duane [name] is a 84 y.o. male admitted on 07/23/2019 with sepsis.  Pharmacy has been consulted for Vancomycin dosing.  Vancomycin 1250 mg IV Q 24 hrs. Goal AUC 400-550. Expected AUC: 501 SCr used: 1.58  Plan: Vancomycin 1250 mg IV q24hr Monitor renal function, clinical status, C&S, and vanc levels as indicated  Height: 5\' 11"  (180.3 cm) Weight: 210 lb 5.1 oz (95.4 kg) IBW/kg (Calculated) : 75.3  Temp (24hrs), Avg:98 F (36.7 C), Min:98 F (36.7 C), Max:98 F (36.7 C)  Recent Labs  Lab 07/23/19 1207 07/23/19 1211  WBC 11.6*  --   CREATININE 1.58*  --   LATICACIDVEN  --  2.9*    Estimated Creatinine Clearance: 42.5 mL/min (A) (by C-G formula based on SCr of 1.58 mg/dL (H)).    Allergies  Allergen Reactions  . Bee Venom Anaphylaxis  . Contrast Media [Iodinated Diagnostic Agents] Anaphylaxis  . Iodine-131 Anaphylaxis  . Metrizamide Anaphylaxis  . Atorvastatin Other (See Comments)    Aching in legs  . Lisinopril Cough    Antimicrobials this admission: Vanc 9/7 >> Zosyn 9/7 >>   Thank you for allowing pharmacy to be a part of this patient's care.  Alanda Slim, PharmD, [facility] Clinical Pharmacist Please see AMION for all Pharmacists' Contact Phone Numbers 07/23/2019, 2:23 PM

## 2019-07-23 NOTE — Progress Notes (Signed)
vLTM started  Neurology notified 

## 2019-07-23 NOTE — Progress Notes (Signed)
eLink Physician-Brief Progress Note Patient Name: Duane [name] DOB: September 02, 1935 MRN: [id number]   Date of Service  07/23/2019  HPI/Events of Note  Hypertension - BP = 181/81.  eICU Interventions  Will order: 1. Hydralazine 10 mg IV Q 4 hours PRN SBP > 170 or DBP > 100.     Intervention Category Major Interventions: Hypertension - evaluation and management  Solange Emry Eugene 07/23/2019, 10:07 PM

## 2019-07-23 NOTE — ED Notes (Signed)
CBG 167 

## 2019-07-23 NOTE — ED Notes (Signed)
CC, Neuro have seen pt. Neuro would like MRI asap.

## 2019-07-23 NOTE — ED Triage Notes (Signed)
Per [facility], pt from [facility]. Pt's family tried calling pt this morning and pt did not answer, pt lives in independent living and staff went to check on him and found him lying in the floor covered in urine altered. Not following commands. Snoring. VSS. Was taken to [facility] yesterday to be checked for UTI and was given muscle relaxer and streroids for back pain.

## 2019-07-23 NOTE — ED Notes (Signed)
This RN and RT took pt to MRI for emergent scan.

## 2019-07-23 NOTE — ED Notes (Signed)
50 mcg bolus VO Neuro

## 2019-07-23 NOTE — H&P (Signed)
NAME:  Duane [name], MRN:  [id number], DOB:  1935/09/16, LOS: 0 ADMISSION DATE:  07/23/2019, CONSULTATION DATE:  07/23/19 REFERRING MD:  Marda Stalker, CHIEF COMPLAINT:  encephalopathy   Brief History   84 year old gentleman found down in his independent living apartment.  Possibly started on baclofen for low back pain at urgent care yesterday.  Chronic memory deficits on Aricept, but no previous dementia diagnosis.  Obtunded and not protecting his airway in the ED without rapidly reversible cause identified.  Intubated uneventfully in the ED.  History of present illness   Duane [name] is an 84 year old gentleman with a past medical history of atrial fibrillation on Eliquis, OSA, myocardial infarction with stenting, diabetes mellitus on oral hypoglycemics, CKD 3, and memory deficits on Aricept.  His family was worried about him today, and when he would not answer the phone they asked employees at his senior living community to check on him, where he was found down on the ground.  He was seen at urgent care yesterday for low back pain, was felt not to have a urinary tract infection, and was started on baclofen.  He was seen in the emergency department in [facility] 3 days prior for altered mental status, but had a normal work-up.  He improved with IV fluids.  At all of these visits he has had a normal blood sugar.  With EMS blood glucose was 160.  Other than baclofen, his family is not aware that he is started any new medications recently.  His family describes waxing and waning symptoms going on for at least several weeks where he was less alert and interactive, sometimes aphasic, which is a deterioration since last spring when he was much more active, including using chainsaws and working in his wood shop.  His history was provided by his daughter Duane [name] and his brother Duane [name] in the ED.  Upon arrival to the ED he remained nonresponsive.  He had unilateral deficits when withdrawing to pain, which  were reportedly new per his family.  CT head without acute abnormalities.  Intubated for airway protection.  Past Medical History  CAD, MI, previous stenting-DES to RCA in 04/2019 at [facility] Diabetes on oral meds Memory deficits on Aricept Hypertension CKD 3  Significant Hospital Events   Intubated in the ED on 07/23/2019  Consults:  Neurology  Procedures:  Intubation  Significant Diagnostic Tests:    Micro Data:  Blood 07/23/2019>> Urine 07/23/2019>> Respiratory 07/23/2019>>  Antimicrobials:  Cefepime 07/23/2019>> Vancomycin 07/23/2019>>  Interim history/subjective:    Objective   Blood pressure (!) 208/89, pulse 75, temperature 98 F (36.7 C), temperature source Oral, resp. rate 19, height 5' 11"  (1.803 m), weight 95.4 kg, SpO2 100 %.    Vent Mode: PRVC FiO2 (%):  [40 %] 40 % Set Rate:  [14 bmp] 14 bmp Vt Set:  [600 mL] 600 mL PEEP:  [5 cmH20] 5 cmH20 Plateau Pressure:  [11 cmH20] 11 cmH20   Intake/Output Summary (Last 24 hours) at 07/23/2019 1527 Last data filed at 07/23/2019 1221 Gross per 24 hour  Intake -  Output 175 ml  Net -175 ml   Filed Weights   07/23/19 1400  Weight: 95.4 kg    Examination: General: Elderly man lying in bed intubated, sedated, under neuromuscular blockade post sedation HENT: Normocephalic, atraumatic. Eyes: Anicteric, PERRL Lungs: Clear to auscultation bilaterally, breathing synchronously with the ventilator. Cardiovascular: Regular rate, irregular rhythm, no murmurs Abdomen: Soft, nontender, nondistended Extremities: No clubbing, cyanosis, pitting edema Neuro: Exam  performed on propofol and following a dose of rocuronium-pupils equal round reactive to light.  Additional neurologic exam deferred. GU: Cath in place draining clear yellow urine  Resolved Hospital Problem list     Assessment & Plan:  Acute hypoxic vent dependent respiratory failure-intubated for airway protection - Delirium precautions - Low tidal volume  ventilation, goal plateau less than 30 and driving pressure less than 15 -VAP prevention protocol -Pepcid for GI prophylaxis -Daily SAT and SBT as appropriate  Acute encephalopathy of unknown etiology.  Found down, unknown duration.  No hypercapnia on ABG to explain encephalopathy.  No known history of seizures or strokes, but he has A. fib. - MRI to evaluate for stroke - When he arrived to the ICU, decrease sedation to allow neurologic assessment -Urine drug screen -Holding Aricept, baclofen, and other potentially sedating medications -Empiric broad-spectrum antibiotics; blood cultures previously drawn in the ED - Neurology consultation, appreciate their assistance  Lactic acidosis- unclear if he was hypoxic when found by EMS.  Raises concern for possible sepsis -Serial lactic acid levels until it normalizes. -Bolus 30cc/kg of LR; does not appear that he received IV fluids in the ED  Hyponatremia, but likely does not fully explain the patient's encephalopathy.  Euvolemic on exam. -Repeat BMP following fluid administration -Urine electrolytes  Coronary artery disease, atrial fibrillation.  Preserved ejection fraction per outside echocardiogram.  Most recent drug-eluting stent in June 2020 at [facility] [facility] will determine if he is appropriate to receive a heparin infusion for A. fib - Daily aspirin -Continue home sotalol -Holding home beta-blocker due to concern for hypotension with sedation  Diabetes, on home metformin and sitagliptin.  No recent evidence of hypoglycemic episodes. -Accu-Cheks every 4 hours and sliding scale insulin PRN -Goal BG 140-180 we will admitted to the ICU -We will add long-acting insulin if required  Hypertension -Holding home meds; need to clarify home regimen  CKD - Monitor I/O -Avoid nephrotoxic meds -Maintain euvolemia  Family updated in the ED consultation room.  Visitor policy explained; patient's  daughter Duane [name] will be the visitor throughout the stay.  There are 2 brothers who are the usual decision-makers who are out of town currently.  All questions were answered  Best practice:  Diet: N.p.o. Pain/Anxiety/Delirium protocol (if indicated): Ordered VAP protocol (if indicated): Ordered DVT prophylaxis: Lovenox GI prophylaxis: Pepcid Glucose control: SSI Mobility: Progressive mobility when able Code Status: Full Family Communication: Updated daughter Duane [name], brother Johnny at bedside Disposition: ICU  Labs   CBC: Recent Labs  Lab 07/23/19 1207 07/23/19 1404  WBC 11.6*  --   NEUTROABS 9.1*  --   HGB 13.2 12.9*  HCT 38.8* 38.0*  MCV 82.9  --   PLT 235  --     Basic Metabolic Panel: Recent Labs  Lab 07/23/19 1207 07/23/19 1404  NA 128* 129*  K 4.2 3.8  CL 91*  --   CO2 25  --   GLUCOSE 179*  --   BUN 24*  --   CREATININE 1.58*  --   CALCIUM 9.4  --    GFR: Estimated Creatinine Clearance: 42.5 mL/min (A) (by C-G formula based on SCr of 1.58 mg/dL (H)). Recent Labs  Lab 07/23/19 1207 07/23/19 1211  WBC 11.6*  --   LATICACIDVEN  --  2.9*    Liver Function Tests: Recent Labs  Lab 07/23/19 1207  AST 50*  ALT 22  ALKPHOS 62  BILITOT 1.2  PROT 7.5  ALBUMIN 4.1   No results for input(s): LIPASE, AMYLASE in the last 168 hours. No results for input(s): AMMONIA in the last 168 hours.  ABG    Component Value Date/Time   PHART 7.423 07/23/2019 1404   PCO2ART 36.0 07/23/2019 1404   PO2ART 135.0 (H) 07/23/2019 1404   HCO3 23.5 07/23/2019 1404   TCO2 25 07/23/2019 1404   ACIDBASEDEF 1.0 07/23/2019 1404   O2SAT 99.0 07/23/2019 1404     Coagulation Profile: No results for input(s): INR, PROTIME in the last 168 hours.  Cardiac Enzymes: No results for input(s): CKTOTAL, CKMB, CKMBINDEX, TROPONINI in the last 168 hours.  HbA1C: No results found for: HGBA1C  CBG: Recent Labs  Lab 07/23/19 1205  GLUCAP 167*    Review of Systems:   Unable to be  performed due to intubated and sedated status  Past Medical History  He,  has a past medical history of A-fib ([place]), Anemia, Angina pectoris ([place]), Arthritis, Carpal tunnel syndrome, Cobalamin deficiency, Coronary artery disease, Diabetes mellitus without complication ([place]), Eczema, Hyperlipemia, Hypertension, Kidney disease, chronic, stage III (moderate, EGFR 30-59 ml/min) ([place]), Lung infiltrate, Myocardial infarct, old, Prostatic hypertrophy, Sleep apnea, obstructive, and Squamous cell cancer of lip.   Surgical History    Past Surgical History:  Procedure Laterality Date  . CORONARY ANGIOPLASTY WITH STENT PLACEMENT       Social History   reports that he has never smoked. He has never used smokeless tobacco. He reports that he does not drink alcohol.   Family History   His family history includes Hypertension in an other family member.   Allergies Allergies  Allergen Reactions  . Bee Venom Anaphylaxis  . Contrast Media [Iodinated Diagnostic Agents] Anaphylaxis  . Iodine-131 Anaphylaxis  . Metrizamide Anaphylaxis  . Atorvastatin Other (See Comments)    Aching in legs  . Lisinopril Cough     Home Medications  Prior to Admission medications   Medication Sig Start Date End Date Taking? Authorizing Provider  amiodarone (PACERONE) 200 MG tablet Take 200 mg by mouth daily.    [provider]  amLODipine (NORVASC) 5 MG tablet Take 1 tablet (5 mg total) by mouth daily. 09/10/16   Dessa Phi, DO  aspirin EC 81 MG tablet Take 81 mg by mouth every other day. On even numbered days    [provider]  b complex vitamins tablet Take 1 tablet by mouth daily.    [provider]  Calcium Carbonate-Vitamin D (CALCIUM-VITAMIN D) 500-200 MG-UNIT tablet Take 1 tablet by mouth daily. 05/16/14   [provider]  clopidogrel (PLAVIX) 75 MG tablet Take 75 mg by mouth daily.    [provider]  donepezil (ARICEPT) 10 MG tablet Take 10 mg by mouth at  bedtime.    [provider]  EPINEPHrine (EPIPEN 2-PAK) 0.3 mg/0.3 mL IJ SOAJ injection Inject 0.3 mg into the muscle once as needed (severe allergic reaction).    [provider]  fluticasone (FLONASE) 50 MCG/ACT nasal spray Place 1 spray into both nostrils daily as needed (congestion).     [provider]  losartan (COZAAR) 100 MG tablet Take 100 mg by mouth daily.    [provider]  metFORMIN (GLUCOPHAGE) 1000 MG tablet Take 1,000 mg by mouth 2 (two) times daily with a meal. 07/05/16   [provider]  metoprolol succinate (TOPROL-XL) 25 MG 24 hr tablet Take 25 mg by mouth daily.    [provider]  Omega-3-6-9 CAPS Take 1  capsule by mouth 2 (two) times daily.     [provider]  pravastatin (PRAVACHOL) 40 MG tablet Take 40 mg by mouth every evening.     [provider]  Probiotic Product (PROBIOTIC PO) Take 1 tablet by mouth daily.    [provider]  SAW PALMETTO-ZINC PO Take 2 tablets by mouth 2 (two) times daily.    [provider]     Critical care time: [address] DO 07/23/19 3:27 PM [facility]

## 2019-07-23 NOTE — ED Notes (Signed)
Dr Tegeler in room, he assessed pt, pt is not moving left side at all to any stimuli and appears to be less responsive to pain now, still has snoring RR's.

## 2019-07-23 NOTE — Progress Notes (Signed)
EEG being performed at this time.  Will hooked up to LTM EEG. On holding propofol, off sedation, he is appearing to move all 4 extremities symmetrically but still not following commands. Plan is to have him on Precedex, and continue LTM overnight  Neurology will continue to follow  Plan discussed with Dr. Carlis Abbott on the unit  -- Amie Portland, MD [facility] Pager: [phone] If 7pm to 7am, please call on call as listed on AMION.

## 2019-07-23 NOTE — ED Notes (Signed)
During painful stimuli pt appears to be moving right arm and leg more than left side.

## 2019-07-23 NOTE — ED Provider Notes (Signed)
[facility] EMERGENCY DEPARTMENT Provider Note   CSN: [id number] Arrival date & time: 07/23/19  1155     History   Chief Complaint Chief Complaint  Patient presents with   Altered Mental Status    HPI Duane [name] is a 84 y.o. male.     The history is provided by medical records and a relative. No language interpreter was used.  Altered Mental Status Presenting symptoms: unresponsiveness   Severity:  Severe Most recent episode:  Today Episode history:  Single Timing:  Constant Progression:  Worsening Chronicity:  Recurrent   LVL 5 caveat for AMS and unresponsiveness   Past Medical History:  Diagnosis Date   A-fib ([place])    Anemia    Angina pectoris (HCC)    Arthritis    Carpal tunnel syndrome    Cobalamin deficiency    Coronary artery disease    Diabetes mellitus without complication (HCC)    Eczema    Hyperlipemia    Hypertension    Kidney disease, chronic, stage III (moderate, EGFR 30-59 ml/min) (HCC)    Lung infiltrate    Myocardial infarct, old    Prostatic hypertrophy    Sleep apnea, obstructive    Squamous cell cancer of lip     Patient Active Problem List   Diagnosis Date Noted   Respiratory failure with hypoxia ([place]) 09/08/2016   Abdominal pain, right upper quadrant 09/08/2016   Essential hypertension 09/08/2016   PAF (paroxysmal atrial fibrillation) ([place]) 09/08/2016   Diabetes mellitus type 2, controlled ([place]) 09/08/2016   ARF (acute renal failure) ([place]) 09/08/2016   Normochromic normocytic anemia 09/08/2016   Abdominal pain 09/08/2016    Past Surgical History:  Procedure Laterality Date   CORONARY ANGIOPLASTY WITH STENT PLACEMENT          Home Medications    Prior to Admission medications   Medication Sig Start Date End Date Taking? Authorizing Provider  amiodarone (PACERONE) 200 MG tablet Take 200 mg by mouth daily.    [provider]  amLODipine (NORVASC) 5 MG tablet Take  1 tablet (5 mg total) by mouth daily. 09/10/16   Dessa Phi, DO  aspirin EC 81 MG tablet Take 81 mg by mouth every other day. On even numbered days    [provider]  b complex vitamins tablet Take 1 tablet by mouth daily.    [provider]  Calcium Carbonate-Vitamin D (CALCIUM-VITAMIN D) 500-200 MG-UNIT tablet Take 1 tablet by mouth daily. 05/16/14   [provider]  clopidogrel (PLAVIX) 75 MG tablet Take 75 mg by mouth daily.    [provider]  donepezil (ARICEPT) 10 MG tablet Take 10 mg by mouth at bedtime.    [provider]  EPINEPHrine (EPIPEN 2-PAK) 0.3 mg/0.3 mL IJ SOAJ injection Inject 0.3 mg into the muscle once as needed (severe allergic reaction).    [provider]  fluticasone (FLONASE) 50 MCG/ACT nasal spray Place 1 spray into both nostrils daily as needed (congestion).     [provider]  losartan (COZAAR) 100 MG tablet Take 100 mg by mouth daily.    [provider]  metFORMIN (GLUCOPHAGE) 1000 MG tablet Take 1,000 mg by mouth 2 (two) times daily with a meal. 07/05/16   [provider]  metoprolol succinate (TOPROL-XL) 25 MG 24 hr tablet Take 25 mg by mouth daily.    [provider]  Omega-3-6-9 CAPS Take 1 capsule by mouth 2 (two) times daily.  [provider]  pravastatin (PRAVACHOL) 40 MG tablet Take 40 mg by mouth every evening.     [provider]  Probiotic Product (PROBIOTIC PO) Take 1 tablet by mouth daily.    [provider]  SAW PALMETTO-ZINC PO Take 2 tablets by mouth 2 (two) times daily.    [provider]    Family History Family History  Problem Relation Age of Onset   Hypertension Other     Social History Social History   Tobacco Use   Smoking status: Never Smoker   Smokeless tobacco: Never Used  Substance Use Topics   Alcohol use: No   Drug use: Not on file     Allergies   Bee venom, Contrast media [iodinated  diagnostic agents], Atorvastatin, and Lisinopril   Review of Systems Review of Systems  Unable to perform ROS: Patient unresponsive     Physical Exam Updated Vital Signs BP (!) 155/70 (BP Location: Right Arm)    Pulse 61    Temp 98 F (36.7 C) (Oral)    Resp 18    SpO2 99%   Physical Exam Vitals signs and nursing note reviewed.  Constitutional:      General: He is not in acute distress.    Appearance: He is ill-appearing. He is not toxic-appearing or diaphoretic.  HENT:     Head: Normocephalic and atraumatic.     Nose: No congestion or rhinorrhea.     Mouth/Throat:     Pharynx: No oropharyngeal exudate or posterior oropharyngeal erythema.  Eyes:     Conjunctiva/sclera: Conjunctivae normal.     Pupils: Pupils are equal, round, and reactive to light.  Neck:     Musculoskeletal: No muscular tenderness.  Cardiovascular:     Rate and Rhythm: Normal rate and regular rhythm.  Pulmonary:     Effort: Bradypnea and respiratory distress present. No tachypnea.     Breath sounds: Rhonchi present.  Chest:     Chest wall: No tenderness.  Abdominal:     General: Abdomen is flat.     Tenderness: There is no abdominal tenderness.  Musculoskeletal:        General: No tenderness.     Right lower leg: No edema.     Left lower leg: No edema.  Skin:    General: Skin is warm.     Capillary Refill: Capillary refill takes less than 2 seconds.     Coloration: Skin is not pale.     Findings: No erythema.  Neurological:     Mental Status: He is unresponsive.     GCS: GCS eye subscore is 1. GCS verbal subscore is 1. GCS motor subscore is 5.     Comments: GCS 7 on arrival with patient only moving his right arm and right leg to severe pain.  Would not open eyes to loud voice or pain.  Would not speak.  Patient not moving his left arm or left leg whatsoever.  No facial droop.  Patient unresponsive.        ED Treatments / Results  Labs (all labs ordered are listed, but only abnormal  results are displayed) Labs Reviewed  LACTIC ACID, PLASMA - Abnormal; Notable for the following components:      Result Value   Lactic Acid, Venous 2.9 (*)    All other components within normal limits  COMPREHENSIVE METABOLIC PANEL - Abnormal; Notable for the following components:   Sodium 128 (*)    Chloride 91 (*)    Glucose,  Bld 179 (*)    BUN 24 (*)    Creatinine, Ser 1.58 (*)    AST 50 (*)    GFR calc non Af Amer 40 (*)    GFR calc Af Amer 47 (*)    All other components within normal limits  CBC WITH DIFFERENTIAL/PLATELET - Abnormal; Notable for the following components:   WBC 11.6 (*)    HCT 38.8 (*)    Neutro Abs 9.1 (*)    Monocytes Absolute 1.1 (*)    All other components within normal limits  URINALYSIS, ROUTINE W REFLEX MICROSCOPIC - Abnormal; Notable for the following components:   Glucose, UA 150 (*)    Ketones, ur 20 (*)    Protein, ur 30 (*)    All other components within normal limits  CK - Abnormal; Notable for the following components:   Total CK 1,617 (*)    All other components within normal limits  CREATININE, SERUM - Abnormal; Notable for the following components:   GFR calc non Af Amer 54 (*)    All other components within normal limits  CBG MONITORING, ED - Abnormal; Notable for the following components:   Glucose-Capillary 167 (*)    All other components within normal limits  POCT I-STAT 7, (LYTES, BLD GAS, ICA,H+H) - Abnormal; Notable for the following components:   pO2, Arterial 135.0 (*)    Sodium 129 (*)    HCT 38.0 (*)    Hemoglobin 12.9 (*)    All other components within normal limits  SARS CORONAVIRUS 2 (HOSPITAL ORDER, [place] [facility])  CULTURE, BLOOD (ROUTINE X 2)  CULTURE, BLOOD (ROUTINE X 2)  URINE CULTURE  MAGNESIUM  TSH  RAPID URINE DRUG SCREEN, HOSP PERFORMED  LACTIC ACID, PLASMA  CBC  LACTIC ACID, PLASMA  LACTIC ACID, PLASMA  PROTIME-INR  AMMONIA  BASIC METABOLIC PANEL  SODIUM, URINE, RANDOM   CREATININE, URINE, RANDOM  OSMOLALITY, URINE  TROPONIN I (HIGH SENSITIVITY)    EKG EKG Interpretation  Date/Time:  Monday July 23 2019 11:57:11 EDT Ventricular Rate:  59 PR Interval:    QRS Duration: 106 QT Interval:  493 QTC Calculation: 489 R Axis:   -7 Text Interpretation:  Sinus rhythm Abnormal R-wave progression, early transition Borderline prolonged QT interval When compared to prior, no significant changes seen.  No STEMI Confirmed by Antony Blackbird [phone]) on 07/23/2019 12:04:24 PM   Radiology Ct Head Wo Contrast  Result Date: 07/23/2019 CLINICAL DATA:  Patient found down today.  Altered mental status. EXAM: CT HEAD WITHOUT CONTRAST TECHNIQUE: Contiguous axial images were obtained from the base of the skull through the vertex without intravenous contrast. COMPARISON:  Head CT scan and brain MRI 07/19/2019. FINDINGS: Brain: No evidence of acute infarction, hemorrhage, hydrocephalus, extra-axial collection or mass lesion/mass effect. Chronic microvascular ischemic change is again seen. Vascular: No hyperdense vessel or unexpected calcification. Skull: Intact.  No focal lesion. Sinuses/Orbits: Mild ethmoid air cell disease and minimal mucosal thickening in the frontal sinuses noted. Other: None. IMPRESSION: No acute abnormality. Chronic microvascular ischemic change. Electronically Signed   By: Inge Rise M.D.   On: 07/23/2019 13:00   Ct Cervical Spine Wo Contrast  Result Date: 07/23/2019 CLINICAL DATA:  Patient found down this morning. Altered mental status. Initial encounter. EXAM: CT CERVICAL SPINE WITHOUT CONTRAST TECHNIQUE: Multidetector CT imaging of the cervical spine was performed without intravenous contrast. Multiplanar CT image reconstructions were also generated. COMPARISON:  None. FINDINGS: Alignment: Maintained. Skull base and vertebrae:  No acute fracture. No primary bone lesion or focal pathologic process. Soft tissues and spinal canal: Negative. Disc levels:  Marked loss of disc space height is seen at C5-6. The C6-7 level is fused. Upper chest: Lung apices clear. Other: None. IMPRESSION: No acute abnormality. Degenerative disc disease C5-6. Electronically Signed   By: Inge Rise M.D.   On: 07/23/2019 13:02   Dg Chest Portable 1 View  Result Date: 07/23/2019 CLINICAL DATA:  Post intubation Second image to confirm OG tube placement EXAM: PORTABLE CHEST 1 VIEW COMPARISON:  Chest radiograph 07/23/2019 at 12:53 p.m. FINDINGS: Status post placement of an endotracheal tube with tip terminating between the thoracic inlet and carina. Interval placement of a nasogastric tube with side port projecting the at or just beyond the GE junction. Stable cardiomediastinal contours. Low lung volumes without new focal infiltrate. No pneumothorax or large pleural effusion. Nonobstructive bowel gas pattern. IMPRESSION: 1. Satisfactory placement of the endotracheal tube terminating between the thoracic inlet and carina. 2. Interval placement of nasogastric tube with side port projecting at or just beyond the GE junction. Recommend short advancement into the stomach. Electronically Signed   By: Audie Pinto M.D.   On: 07/23/2019 14:20   Dg Chest Portable 1 View  Result Date: 07/23/2019 CLINICAL DATA:  Altered Mental Status, ? Stroke, Sleep Apnea. Tech wore mask, face shield, and gloves. Patient wore a mask. EXAM: PORTABLE CHEST 1 VIEW COMPARISON:  Chest radiograph 07/19/2019, 05/31/2019 FINDINGS: Stable cardiomediastinal contours. Minimal opacity at the left base likely atelectasis or scarring. No focal infiltrate. No pneumothorax or large pleural effusion. No acute finding in the visualized skeleton. IMPRESSION: Minimal left basilar atelectasis or scarring. No other acute finding. Electronically Signed   By: Audie Pinto M.D.   On: 07/23/2019 13:18    Procedures Procedure Name: Intubation Date/Time: 07/23/2019 4:46 PM Performed by: Courtney Paris,  MD Pre-anesthesia Checklist: Patient identified, Patient being monitored, Emergency Drugs available, Timeout performed and Suction available Oxygen Delivery Method: Non-rebreather mask Preoxygenation: Pre-oxygenation with 100% oxygen Induction Type: Rapid sequence Ventilation: Mask ventilation without difficulty Laryngoscope Size: Glidescope Grade View: Grade I Tube size: 7.5 mm Number of attempts: 1 Placement Confirmation: ETT inserted through vocal cords under direct vision,  CO2 detector and Breath sounds checked- equal and bilateral Secured at: 23 cm Tube secured with: ETT holder Dental Injury: Teeth and Oropharynx as per pre-operative assessment       (including critical care time)  CRITICAL CARE Performed by: Gwenyth Allegra Milika Ventress Total critical care time: 45 minutes Critical care time was exclusive of separately billable procedures and treating other patients. Critical care was necessary to treat or prevent imminent or life-threatening deterioration. Critical care was time spent personally by me on the following activities: development of treatment plan with patient and/or surrogate as well as nursing, discussions with consultants, evaluation of patient's response to treatment, examination of patient, obtaining history from patient or surrogate, ordering and performing treatments and interventions, ordering and review of laboratory studies, ordering and review of radiographic studies, pulse oximetry and re-evaluation of patient's condition.   Medications Ordered in ED Medications  propofol (DIPRIVAN) 1000 MG/100ML infusion (has no administration in time range)  enoxaparin (LOVENOX) injection 40 mg (has no administration in time range)  famotidine (PEPCID) 40 MG/5ML suspension 20 mg (has no administration in time range)  acetaminophen (TYLENOL) tablet 650 mg (has no administration in time range)  ipratropium-albuterol (DUONEB) 0.5-2.5 (3) MG/3ML nebulizer solution 3 mL (3 mLs  Nebulization Given 07/23/19 1431)  vancomycin (VANCOCIN) 1,250 mg in sodium chloride 0.9 % 250 mL IVPB (has no administration in time range)  propofol (DIPRIVAN) 1000 MG/100ML infusion (has no administration in time range)  propofol (DIPRIVAN) 1000 MG/100ML infusion (has no administration in time range)  fentaNYL (SUBLIMAZE) injection 25 mcg (has no administration in time range)  fentaNYL (SUBLIMAZE) injection 25-100 mcg (has no administration in time range)  sotalol (BETAPACE) tablet 80 mg (has no administration in time range)  aspirin chewable tablet 81 mg (has no administration in time range)  clopidogrel (PLAVIX) tablet 75 mg (has no administration in time range)  lactated ringers bolus 2,862 mL (has no administration in time range)  piperacillin-tazobactam (ZOSYN) IVPB 3.375 g (has no administration in time range)  rocuronium (ZEMURON) injection (50 mg Intravenous Given 07/23/19 1333)  etomidate (AMIDATE) injection (20 mg Intravenous Given 07/23/19 1332)  propofol (DIPRIVAN) 1000 MG/100ML infusion (50 mcg/kg/min  95.4 kg (Order-Specific) Intravenous Rate/Dose Change 07/23/19 1448)     Initial Impression / Assessment and Plan / ED Course  I have reviewed the triage vital signs and the nursing notes.  Pertinent labs & imaging results that were available during my care of the patient were reviewed by me and considered in my medical decision making (see chart for details).        Duane [name] is a 84 y.o. male with a past medical history significant for hypertension, diabetes, paroxysmal atrial fibrillation not on anticoagulation, and prior encephalopathy who presents for altered mental status.  According to family who was able to provide further information, they thought patient may have a UTI yesterday and he was evaluated.  They do not think he was found to have UTI and he was discharged.  He is found down today unresponsive.  On arrival, GCS is 7 and he is only moving his right arm and  right leg to deep painful stimulation.  He is not moving anything on his left side.  He is not protecting his airway on arrival and is gurgling with his respirations.  He is intermittently having apnea.  Decision made to intubate to protect airway.  Patient intubated with RSI without difficulty.  Patient had work-up including head CT and labs to determine etiology of his altered mental status.  Critical care was called who will admit patient.  They were going to start antibiotics although a definite infectious source had not yet been discovered.  Neurology recommended MRI which was ordered.  Patient will be admitted to critical care team for further management of the altered mental status unknown etiology requiring intubation to protect airway.   Final Clinical Impressions(s) / ED Diagnoses   Final diagnoses:  Altered mental status, unspecified altered mental status type    ED Discharge Orders    None      Clinical Impression: 1. Altered mental status, unspecified altered mental status type     Disposition: Admit  This note was prepared with assistance of Dragon voice recognition software. Occasional wrong-word or sound-a-like substitutions may have occurred due to the inherent limitations of voice recognition software.         Lakeia Bradshaw, Gwenyth Allegra, MD 07/23/19 1655

## 2019-07-23 NOTE — ED Notes (Signed)
Pt remains in MRI 

## 2019-07-23 NOTE — Procedures (Addendum)
Patient Name: Duane [name]  MRN: [id number]  Epilepsy Attending: Lora Havens  Referring Physician/Provider: Etta Quill, PA Date: 07/23/2019 Duration: 21.39 mins  Patient history: 84yo m with ams. EEG to evaluate for seizure.  Level of alertness: comatose  AEDs during EEG study: propofol  Technical aspects: This EEG study was done with scalp electrodes positioned according to the 10-20 International system of electrode placement. Electrical activity was acquired at a sampling rate of 500Hz  and reviewed with a high frequency filter of 70Hz  and a low frequency filter of 1Hz . EEG data were recorded continuously and digitally stored.   DESCRIPTION: EEG showed continuous generalized polymorphic 2-4hz  theta-delta slowing, at times with triphasic morphology. No clear posterior dominant rhythm was seen. Hyperventilation and photic stimulation were not performed.  ABNORMALITY: 1. 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 No seizures or epileptiform discharges were seen throughout the recording.    Duane [name] [name]

## 2019-07-23 NOTE — Progress Notes (Signed)
ANTICOAGULATION CONSULT NOTE - Initial Consult  Pharmacy Consult for Heparin Indication: atrial fibrillation (Apixaban PTA on hold)  Allergies  Allergen Reactions  . Bee Venom Anaphylaxis  . Contrast Media [Iodinated Diagnostic Agents] Anaphylaxis  . Iodine-131 Anaphylaxis  . Metrizamide Anaphylaxis  . Metronidazole Anaphylaxis  . Atorvastatin Other (See Comments)    Aching in legs  . Lisinopril Cough    Patient Measurements: Height: _0  (180.3 cm) Weight: 201 lb 1 oz (91.2 kg) IBW/kg (Calculated) : 75.3 Heparin Dosing Weight: 91.2kg  Vital Signs: Temp: 96.9 F (36.1 C) (09/07 2014) Temp Source: Axillary (09/07 2014) BP: 183/85 (09/07 2000) Pulse Rate: 76 (09/07 2002)  Labs: Recent Labs    07/23/19 1207 07/23/19 1404 07/23/19 1447 07/23/19 1840  HGB 13.2 12.9*  --   --   HCT 38.8* 38.0*  --   --   PLT 235  --   --   --   APTT  --   --   --  39*  LABPROT  --   --   --  14.9  INR  --   --   --  1.2  HEPARINUNFRC  --   --   --  1.50*  CREATININE 1.58*  --  1.24 1.27*  CKTOTAL  --   --  1,617*  --   TROPONINIHS  --   --  17  --     Estimated Creatinine Clearance: 51.8 mL/min (A) (by C-G formula based on SCr of 1.27 mg/dL (H)).   Medical History: Past Medical History:  Diagnosis Date  . A-fib ([place])   . Anemia   . Angina pectoris ([place])   . Arthritis   . Carpal tunnel syndrome   . Cobalamin deficiency   . Coronary artery disease   . Diabetes mellitus without complication ([place])   . Eczema   . Hyperlipemia   . Hypertension   . Kidney disease, chronic, stage III (moderate, EGFR 30-59 ml/min) (H[facility])   . Lung infiltrate   . Myocardial infarct, old   . Prostatic hypertrophy   . Sleep apnea, obstructive   . Squamous cell cancer of lip     Medications:  Medications Prior to Admission  Medication Sig Dispense Refill Last Dose  . apixaban (ELIQUIS) 5 MG TABS tablet Take 5 mg by mouth 2 (two) times daily.   07/22/2019 at Unknown time  . Ascorbic Acid  (VITAMIN C PO) Take 1 tablet by mouth daily.   07/22/2019 at Unknown time  . b complex vitamins tablet Take 1 tablet by mouth daily.   Past Week at Unknown time  . Baclofen 5 MG TABS Take 1 tablet by mouth daily.    07/22/2019 at Unknown time  . Calcium-Magnesium-Vitamin D (CALCIUM MAGNESIUM PO) Take 1 tablet by mouth daily.    07/22/2019 at Unknown time  . calcium-vitamin D (OSCAL WITH D) 500-200 MG-UNIT tablet Take 1 tablet by mouth daily with breakfast.   07/22/2019 at Unknown time  . clopidogrel (PLAVIX) 75 MG tablet Take 75 mg by mouth daily.   07/22/2019 at Unknown time  . diclofenac sodium (VOLTAREN) 1 % GEL Apply 2 g topically daily as needed (For muscle pain).    Past Week at Unknown time  . EPINEPHrine (EPIPEN 2-PAK) 0.3 mg/0.3 mL IJ SOAJ injection Inject 0.3 mg into the muscle once as needed (severe allergic reaction).   unknown at unknown  . losartan (COZAAR) 100 MG tablet Take 100 mg by mouth daily.   07/22/2019 at Unknown  time  . memantine (NAMENDA) 5 MG tablet Take 5 mg by mouth daily.   07/22/2019 at Unknown time  . metFORMIN (GLUCOPHAGE) 1000 MG tablet Take 1,000 mg by mouth 2 (two) times daily with a meal.   07/22/2019 at Unknown time  . metoprolol succinate (TOPROL-XL) 25 MG 24 hr tablet Take 25 mg by mouth 2 (two) times daily.    07/22/2019 at 0800  . nitroGLYCERIN (NITROSTAT) 0.4 MG SL tablet Place 0.4 mg under the tongue every 5 (five) minutes as needed for chest pain.   unknown at unknown  . Omega-3-6-9 CAPS Take 2 capsules by mouth 2 (two) times daily.    07/22/2019 at Unknown time  . OVER THE COUNTER MEDICATION Apply 1 application topically 2 (two) times daily as needed (pain). CBD Oil   Past Week at Unknown time  . Probiotic Product (PROBIOTIC PO) Take 1 tablet by mouth daily.   07/22/2019 at Unknown time  . saw palmetto 160 MG capsule Take 160 mg by mouth 2 (two) times daily.   07/22/2019 at Unknown time  . sitaGLIPtin (JANUVIA) 25 MG tablet Take 25 mg by mouth every evening.   07/22/2019 at  Unknown time  . sotalol (BETAPACE) 80 MG tablet Take 80 mg by mouth 2 (two) times daily.   07/22/2019 at 0800    Assessment: 84 yo M with AMS.  Pt was found down by assisted living staff, altered, covered in urine.  Transferred to [facility].  Intubated in ED.  Daughter reports cognitive decline over the past few months and frequent episodes of difficulty with word finding over the past few weeks.  Pt on Apixaban PTA.  No evidence of acute stroke on MRI.  To start heparin while NPO (intubated).  Unknown last dose of Apixaban.  Baseline heparin level is elevated showing recent apixaban use.  aPTT 39, INR 1.2.  Suspect last Apixaban dose was 9/6 PM. Will start heparin and monitor with aPTTs for now until heparin levels begin to correlate.  Goal of Therapy:  Heparin level 0.3-0.7 units/ml aPTT 66-102 seconds Monitor platelets by anticoagulation protocol: Yes   Plan:  Heparin infusion at 1300 units/hr.  No bolus given recent Apixaban use. Heparin level and aPTT in 8 hours. Daily heparin level, aPTT, and CBC while on heparin.  [employer], Pharm.D., BCPS Clinical Pharmacist  **Pharmacist phone directory can now be found on [email or web address] ([email or web address]).  Listed under [place].  07/23/2019 8:33 PM

## 2019-07-23 NOTE — Consult Note (Addendum)
Neurology Consultation  Reason for Consult: Confusion and left arm weakness noted just prior to intubation Referring Physician: Carlis Abbott  History is obtained from: Daughter  HPI: Duane [name] is a 84 y.o. male with history of squamous cell lip cancer, sleep apnea, prostatic hypertrophy, lung infiltration, chronic kidney disease stage III, hypertension, hyperlipidemia, diabetes, CAD, atrial fibrillation on Eliquis.  In talking to the daughter, patient has not been feeling well over the past few weeks.  In fact over the past few months he has had cardiovascular stents placed secondary to clogged arteries in the heart.  They have also noticed that he has been sleeping a lot, not eating a lot or drinking a lot.  Patient does live in a independent living center and they have also noticed some neurocognitive decline over the past year-of note he is on Aricept.  Last Thursday daughter checked on patient and noted that he was having difficulty getting his words out.  For instance, he wanted the daughter to come to the back door when he meant the front door.  He would also mix words up.  Due to all of the medical issues he has been dealing with the patient's family tried to call the patient this morning however the patient did not answer.  They did call the independent living staff to check on him and found him lying on the floor covered in urine and altered.  Patient was then transferred to [facility] via EMS.  Patient was then intubated secondary to airway protection.  Prior to the intubation, he was noted to be plegic on the left side.  No gaze noted.  Neurology was called after he was intubated and was under the effect of sedation and chemical paralytics that were required for sedation.  Currently patient is intubated and breathing over the vent.  ROS:  Unable to obtain due to altered mental status.  Daughter reports cognitive decline over the past few months, decreased p.o. intake and frequent episodes of  word finding difficulty over the past few weeks to months. Seen at urgent care yesterday, started on baclofen  Past Medical History:  Diagnosis Date  . A-fib ([place])   . Anemia   . Angina pectoris ([place])   . Arthritis   . Carpal tunnel syndrome   . Cobalamin deficiency   . Coronary artery disease   . Diabetes mellitus without complication ([place])   . Eczema   . Hyperlipemia   . Hypertension   . Kidney disease, chronic, stage III (moderate, EGFR 30-59 ml/min) ([facility])   . Lung infiltrate   . Myocardial infarct, old   . Prostatic hypertrophy   . Sleep apnea, obstructive   . Squamous cell cancer of lip     Family History  Problem Relation Age of Onset  . Hypertension Other    Social History:   reports that he has never smoked. He has never used smokeless tobacco. He reports that he does not drink alcohol. No history on file for drug.  Medications  Current Facility-Administered Medications:  .  acetaminophen (TYLENOL) tablet 650 mg, 650 mg, Oral, Q4H PRN, Noemi Chapel P, DO .  enoxaparin (LOVENOX) injection 40 mg, 40 mg, Subcutaneous, Q24H, Clark, Laura P, DO .  famotidine (PEPCID) 40 MG/5ML suspension 20 mg, 20 mg, Per Tube, BID, Clark, Laura P, DO .  ipratropium-albuterol (DUONEB) 0.5-2.5 (3) MG/3ML nebulizer solution 3 mL, 3 mL, Nebulization, Q6H, Clark, Laura P, DO, 3 mL at 07/23/19 1431 .  piperacillin-tazobactam (ZOSYN) IVPB 3.375  g, 3.375 g, Intravenous, Q8H, Clark, Laura P, DO .  propofol (DIPRIVAN) 1000 MG/100ML infusion, , , ,  .  propofol (DIPRIVAN) 1000 MG/100ML infusion, 5-80 mcg/kg/min, Intravenous, Continuous, Amie Portland, MD .  vancomycin (VANCOCIN) 1,250 mg in sodium chloride 0.9 % 250 mL IVPB, 1,250 mg, Intravenous, Q24H, Carlis Abbott, Venita Sheffield, DO  Current Outpatient Medications:  .  acetaminophen (TYLENOL) 325 MG tablet, Take 650 mg by mouth 2 (two) times daily., Disp: , Rfl:  .  amLODipine (NORVASC) 5 MG tablet, Take 1 tablet (5 mg total) by mouth daily. (Patient  taking differently: Take 10 mg by mouth daily. ), Disp: 30 tablet, Rfl: 0 .  apixaban (ELIQUIS) 5 MG TABS tablet, Take 5 mg by mouth 2 (two) times daily., Disp: , Rfl:  .  b complex vitamins tablet, Take 1 tablet by mouth daily., Disp: , Rfl:  .  Baclofen 5 MG TABS, Take by mouth., Disp: , Rfl:  .  Calcium Carbonate-Vitamin D (CALCIUM-VITAMIN D) 500-200 MG-UNIT tablet, Take 1 tablet by mouth daily., Disp: , Rfl:  .  Calcium-Magnesium-Vitamin D (CALCIUM MAGNESIUM PO), Take 1 tablet by mouth 2 (two) times daily., Disp: , Rfl:  .  diclofenac sodium (VOLTAREN) 1 % GEL, Apply 2 g topically 2 (two) times daily., Disp: , Rfl:  .  donepezil (ARICEPT) 10 MG tablet, Take 10 mg by mouth at bedtime., Disp: , Rfl:  .  DULoxetine (CYMBALTA) 30 MG capsule, Take 30 mg by mouth daily., Disp: , Rfl:  .  EPINEPHrine (EPIPEN 2-PAK) 0.3 mg/0.3 mL IJ SOAJ injection, Inject 0.3 mg into the muscle once as needed (severe allergic reaction)., Disp: , Rfl:  .  fluticasone (FLONASE) 50 MCG/ACT nasal spray, Place 1 spray into both nostrils daily as needed for allergies. , Disp: , Rfl:  .  loratadine (CLARITIN) 10 MG tablet, Take 10 mg by mouth daily as needed for allergies., Disp: , Rfl:  .  losartan (COZAAR) 100 MG tablet, Take 100 mg by mouth daily., Disp: , Rfl:  .  memantine (NAMENDA) 5 MG tablet, Take 5 mg by mouth daily., Disp: , Rfl:  .  metFORMIN (GLUCOPHAGE) 1000 MG tablet, Take 1,000 mg by mouth 2 (two) times daily with a meal., Disp: , Rfl:  .  nitroGLYCERIN (NITROSTAT) 0.4 MG SL tablet, Place 0.4 mg under the tongue every 5 (five) minutes as needed for chest pain., Disp: , Rfl:  .  Omega-3-6-9 CAPS, Take 1 capsule by mouth daily. , Disp: , Rfl:  .  OVER THE COUNTER MEDICATION, Apply 1 application topically 2 (two) times daily as needed (pain). CBD Oil, Disp: , Rfl:  .  Probiotic Product (PROBIOTIC PO), Take 1 tablet by mouth daily., Disp: , Rfl:  .  sitaGLIPtin (JANUVIA) 25 MG tablet, Take 25 mg by mouth every  evening., Disp: , Rfl:  .  sotalol (BETAPACE) 80 MG tablet, Take 80 mg by mouth 2 (two) times daily., Disp: , Rfl:  .  triamcinolone cream (KENALOG) 0.1 %, Apply 1 application topically 2 (two) times daily as needed (itching)., Disp: , Rfl:  .  amiodarone (PACERONE) 200 MG tablet, Take 200 mg by mouth daily., Disp: , Rfl:  .  aspirin EC 81 MG tablet, Take 81 mg by mouth every other day. On even numbered days, Disp: , Rfl:  .  clopidogrel (PLAVIX) 75 MG tablet, Take 75 mg by mouth daily., Disp: , Rfl:  .  metoprolol succinate (TOPROL-XL) 25 MG 24 hr tablet, Take 25 mg by  mouth daily., Disp: , Rfl:  .  pravastatin (PRAVACHOL) 40 MG tablet, Take 40 mg by mouth every evening. , Disp: , Rfl:    Exam: Current vital signs: BP (!) 208/89   Pulse 75   Temp 98 F (36.7 C) (Oral)   Resp 19   Ht 5' 11"  (1.803 m)   Wt 95.4 kg   SpO2 98%   BMI 29.33 kg/m  Vital signs in last 24 hours: Temp:  [98 F (36.7 C)] 98 F (36.7 C) (09/07 1200) Pulse Rate:  [60-102] 75 (09/07 1435) Resp:  [10-20] 19 (09/07 1435) BP: (147-209)/(63-114) 208/89 (09/07 1435) SpO2:  [97 %-100 %] 98 % (09/07 1435) FiO2 (%):  [40 %] 40 % (09/07 1431) Weight:  [95.4 kg] 95.4 kg (09/07 1400)  Physical Exam  Constitutional: Appears well-developed and well-nourished.  Psych: Affect appropriate to situation Eyes: No scleral injection HENT: No OP obstrucion Head: Normocephalic.  Cardiovascular: Normal rate and regular rhythm.  Respiratory: Effort normal, non-labored breathing GI: Soft.  No distension. There is no tenderness.  Skin: WDI  Neuro: Mental Status: Patient intubated and on propofol-does not respond to verbal stimuli.  Does not respond to deep sternal rub.  Does not follow commands.  No verbalizations noted.  Cranial Nerves: II: patient does not respond confrontation bilaterally,  III,IV,VI: doll's response absent bilaterally. pupils right 2 mm, left 2 mm,and reactive bilaterally V,VII: corneal reflex absent  bilaterally  VIII: patient does not respond to verbal stimuli IX,X: gag reflex present, XI: trapezius strength unable to test bilaterally XII: tongue strength unable to test Motor: When gagging the tube patient is showing extensor movements Sensory: To start noxious stimuli bilateral lower extremities right upper extremity but not left upper extremity Deep Tendon Reflexes:  Absent throughout. Plantars: absent bilaterally Cerebellar: Unable to perform  Labs I have reviewed labs in epic and the results pertinent to this consultation are: CBC    Component Value Date/Time   WBC 11.6 (H) 07/23/2019 1207   RBC 4.68 07/23/2019 1207   HGB 12.9 (L) 07/23/2019 1404   HCT 38.0 (L) 07/23/2019 1404   PLT 235 07/23/2019 1207   MCV 82.9 07/23/2019 1207   MCH 28.2 07/23/2019 1207   MCHC 34.0 07/23/2019 1207   RDW 14.0 07/23/2019 1207   LYMPHSABS 1.3 07/23/2019 1207   MONOABS 1.1 (H) 07/23/2019 1207   EOSABS 0.0 07/23/2019 1207   BASOSABS 0.0 07/23/2019 1207    CMP -hyponatremia sodium 129, BUN 24, creatinine 1.58, AST 50    Component Value Date/Time   NA 129 (L) 07/23/2019 1404   K 3.8 07/23/2019 1404   CL 91 (L) 07/23/2019 1207   CO2 25 07/23/2019 1207   GLUCOSE 179 (H) 07/23/2019 1207   BUN 24 (H) 07/23/2019 1207   CREATININE 1.58 (H) 07/23/2019 1207   CALCIUM 9.4 07/23/2019 1207   PROT 7.5 07/23/2019 1207   ALBUMIN 4.1 07/23/2019 1207   AST 50 (H) 07/23/2019 1207   ALT 22 07/23/2019 1207   ALKPHOS 62 07/23/2019 1207   BILITOT 1.2 07/23/2019 1207   GFRNONAA 40 (L) 07/23/2019 1207   GFRAA 47 (L) 07/23/2019 1207   Imaging I have reviewed the images obtained: CT-scan of the brain- no acute abnormality CT cervical spine- no acute abnormality but does have a degenerative disc disease at C5-6 MRI examination of the brain/MRA head without contrast/MRA neck without contrast-pending  Etta Quill PA-C [facility] [phone]  M-F  (9:00 am- 5:00 PM)  07/23/2019,  2:52 PM  Attending addendum I have seen and examined the patient. I have spoken to the family and obtain history.  Agree with history and physical documented by Etta Quill, PA-C above I have independently performed my examination. Patient was examined after intubation, which was done after administering sedation and chemical politics.  The only pertinent examination was reactive pupils and breathing over the ventilator.  Other exam was marred by the sedation.  Assessment:  84 year old man with multiple medical comorbidities including pertinent comorbidities being sleep apnea, CKD 3, hypertension, hyperlipidemia, coronary artery disease, atrial fibrillation on Eliquis with progressive cognitive decline over the past few weeks to months but more so in the past few weeks where he has also not been eating and drinking as much.  He lives in independent living and was found down when he did not respond to his phone calls and somebody went there for a welfare check. There is no reported history of seizures in the past or any witnessed seizure at this time. On examination prior to intubation according to the EDP, he was plegic on the left.  There was no gaze preference or deviation. On examination from the neurological team was limited due to sedation and chemical paralytics on board.  Based on the past medical history, presenting history and limited physical examination, the differential diagnosis is as follows: 1.  Evaluate for stroke 2.  Evaluate for seizures (or adverse effect of baclofen?) 3.  Evaluate for vascular insufficiency such as carotid stenosis or posterior circulation stenoses, leading to syncope 4.  Toxic metabolic encephalopathy  Recommendations: -Stat MRI MRA of the head and neck without contrast due to borderline renal function - Followed by stat EEG.  Will consider LTM EEG if spot EEG is concerning for status or seizure activity. - We will check ammonia levels   CRITICAL CARE  ATTESTATION Performed by: Amie Portland, MD Total critical care time:55 minutes Critical care time was exclusive of separately billable procedures and treating other patients and/or supervising APPs/Residents/Students Critical care was necessary to treat or prevent imminent or life-threatening deterioration due to encephalopathy, AMS of unknown etiology.  This patient is critically ill and at significant risk for neurological worsening and/or death and care requires constant monitoring. Critical care was time spent personally by me on the following activities: development of treatment plan with patient and/or surrogate as well as nursing, discussions with consultants, evaluation of patient's response to treatment, examination of patient, obtaining history from patient or surrogate, ordering and performing treatments and interventions, ordering and review of laboratory studies, ordering and review of radiographic studies, pulse oximetry, re-evaluation of patient's condition, participation in multidisciplinary rounds and medical decision making of high complexity in the care of this patient.    Addendum MRI brain, MRA head without contrast, MRA neck without contrast completed.  Reviewed by me personally. No evidence of acute stroke on the MRI brain. MRA head/neck with no acute changes or significant stenoses. EEG ordered stat. Being performed now. Will update chart after EEG available.  -- Amie Portland, MD [facility] Pager: [phone] If 7pm to 7am, please call on call as listed on AMION.

## 2019-07-23 NOTE — ED Notes (Signed)
Suctioned pt

## 2019-07-23 NOTE — ED Notes (Signed)
Pt taken to CT scan by this RN.

## 2019-07-23 NOTE — Progress Notes (Signed)
EEG complete - results pending 

## 2019-07-23 NOTE — ED Notes (Signed)
Dr Tegeler viewed xray for placement of tubes.

## 2019-07-23 NOTE — Progress Notes (Signed)
eLink Physician-Brief Progress Note Patient Name: Duane [name] DOB: 05/27/1935 MRN: [id number]   Date of Service  07/23/2019  HPI/Events of Note  Agitation - Request for bilateral soft wrist restraints.   eICU Interventions  Will order bilateral soft wrist restraints.      Intervention Category Major Interventions: Delirium, psychosis, severe agitation - evaluation and management  Chetara Kropp Eugene 07/23/2019, 11:36 PM

## 2019-07-23 NOTE — Progress Notes (Signed)
Pt transported to MRI.  Handoff report given to M. Walker, RRT who will monitor the patient during the MRI procedure.

## 2019-07-23 NOTE — Progress Notes (Signed)
Pt BP remains elevated int the 180's. Elink made aware. Will continue to assess.

## 2019-07-24 DIAGNOSIS — G92 Toxic encephalopathy: Principal | ICD-10-CM

## 2019-07-24 LAB — TYPE AND SCREEN
ABO/RH(D): B POS
Antibody Screen: NEGATIVE

## 2019-07-24 LAB — CBC
HCT: 35.7 % — ABNORMAL LOW (ref 39.0–52.0)
Hemoglobin: 12.4 g/dL — ABNORMAL LOW (ref 13.0–17.0)
MCH: 28.3 pg (ref 26.0–34.0)
MCHC: 34.7 g/dL (ref 30.0–36.0)
MCV: 81.5 fL (ref 80.0–100.0)
Platelets: 190 10*3/uL (ref 150–400)
RBC: 4.38 MIL/uL (ref 4.22–5.81)
RDW: 14.1 % (ref 11.5–15.5)
WBC: 14 10*3/uL — ABNORMAL HIGH (ref 4.0–10.5)
nRBC: 0 % (ref 0.0–0.2)

## 2019-07-24 LAB — GLUCOSE, CAPILLARY
Glucose-Capillary: 125 mg/dL — ABNORMAL HIGH (ref 70–99)
Glucose-Capillary: 132 mg/dL — ABNORMAL HIGH (ref 70–99)
Glucose-Capillary: 136 mg/dL — ABNORMAL HIGH (ref 70–99)
Glucose-Capillary: 140 mg/dL — ABNORMAL HIGH (ref 70–99)
Glucose-Capillary: 144 mg/dL — ABNORMAL HIGH (ref 70–99)
Glucose-Capillary: 150 mg/dL — ABNORMAL HIGH (ref 70–99)
Glucose-Capillary: 243 mg/dL — ABNORMAL HIGH (ref 70–99)

## 2019-07-24 LAB — URINE CULTURE: Culture: NO GROWTH

## 2019-07-24 LAB — BASIC METABOLIC PANEL
Anion gap: 12 (ref 5–15)
BUN: 16 mg/dL (ref 8–23)
CO2: 23 mmol/L (ref 22–32)
Calcium: 8.7 mg/dL — ABNORMAL LOW (ref 8.9–10.3)
Chloride: 94 mmol/L — ABNORMAL LOW (ref 98–111)
Creatinine, Ser: 1.04 mg/dL (ref 0.61–1.24)
GFR calc Af Amer: >60 mL/min (ref >60)
GFR calc non Af Amer: >60 mL/min (ref >60)
Glucose, Bld: 122 mg/dL — ABNORMAL HIGH (ref 70–99)
Potassium: 3.3 mmol/L — ABNORMAL LOW (ref 3.5–5.1)
Sodium: 129 mmol/L — ABNORMAL LOW (ref 135–145)

## 2019-07-24 LAB — HEPARIN LEVEL (UNFRACTIONATED): Heparin Unfractionated: 1.18 IU/mL — ABNORMAL HIGH (ref 0.30–0.70)

## 2019-07-24 LAB — HEMOGLOBIN AND HEMATOCRIT, BLOOD
HCT: 35 % — ABNORMAL LOW (ref 39.0–52.0)
Hemoglobin: 11.9 g/dL — ABNORMAL LOW (ref 13.0–17.0)

## 2019-07-24 LAB — ABO/RH: ABO/RH(D): B POS

## 2019-07-24 LAB — APTT: aPTT: 69 seconds — ABNORMAL HIGH (ref 24–36)

## 2019-07-24 MED ORDER — ORAL CARE MOUTH RINSE
15.0000 mL | Freq: Two times a day (BID) | OROMUCOSAL | Status: DC
  Administered 2019-07-25 – 2019-07-27 (×4): 15 mL via OROMUCOSAL

## 2019-07-24 MED ORDER — POTASSIUM CHLORIDE 20 MEQ/15ML (10%) PO SOLN
40.0000 meq | Freq: Two times a day (BID) | ORAL | Status: AC
  Administered 2019-07-24: 40 meq
  Filled 2019-07-24: qty 30

## 2019-07-24 MED ORDER — RAMELTEON 8 MG PO TABS
8.0000 mg | ORAL_TABLET | Freq: Every day | ORAL | Status: DC
  Administered 2019-07-25 – 2019-07-26 (×2): 8 mg via ORAL
  Filled 2019-07-24 (×5): qty 1

## 2019-07-24 MED ORDER — PANTOPRAZOLE SODIUM 40 MG PO TBEC
40.0000 mg | DELAYED_RELEASE_TABLET | Freq: Every day | ORAL | Status: DC
  Administered 2019-07-24 – 2019-07-27 (×4): 40 mg via ORAL
  Filled 2019-07-24 (×4): qty 1

## 2019-07-24 MED ORDER — METOPROLOL TARTRATE 25 MG PO TABS
25.0000 mg | ORAL_TABLET | Freq: Two times a day (BID) | ORAL | Status: DC
  Administered 2019-07-24: 25 mg via ORAL
  Filled 2019-07-24 (×2): qty 1

## 2019-07-24 NOTE — Progress Notes (Signed)
LTM EEG discontinued -Possible Skin breakdown at F7/F8 area. Nurse notified

## 2019-07-24 NOTE — Progress Notes (Addendum)
NEUROLOGY PROGRESS NOTE  Subjective: Patient currently intubated.  Appears comfortable but not able to follow commands.  When asked appears to be confused.  When asked with sign language if he can hear properly, he says no.  Exam: Vitals:   07/24/19 0706 07/24/19 0800  BP: (!) 196/70 (!) 171/72  Pulse: (!) 102 86  Resp: (!) 22 17  Temp: 98.3 F (36.8 C)   SpO2: 99% 95%    Physical Exam   HEENT-  Normocephalic, no lesions, without obvious abnormality.  Normal external eye and conjunctiva.   Extremities- Warm, dry and intact Musculoskeletal-no joint tenderness, deformity or swelling Skin-warm and dry, no hyperpigmentation, vitiligo, or suspicious lesions Neuro:  Mental Status: Currently intubated.  Unable to follow commands verbally but when asked later if he can hear properly he said no.  Was able to follow/mimic some commands later on. Cranial Nerves: II:  Visual fields grossly normal,  III,IV, VI: ptosis not present, extra-ocular motions intact bilaterally pupils equal, round, reactive to light and accommodation V,VII: Intubated but appears symmetric, facial light touch sensation normal bilaterally VIII: Difficulty with hearing bilaterally  Motor: Moving all extremities 5/5.  Appears to have paratonia at during some points and other points has normal tone Sensory: LT intact throughout, bilaterally Deep Tendon Reflexes: 2+ and symmetric throughout Plantars: Right: downgoing   Left: downgoing   Medications:  Scheduled: . aspirin  81 mg Per Tube Daily  . chlorhexidine gluconate (MEDLINE KIT)  15 mL Mouth Rinse BID  . Chlorhexidine Gluconate Cloth  6 each Topical Daily  . clopidogrel  75 mg Oral Daily  . insulin aspart  0-15 Units Subcutaneous Q4H  . mouth rinse  15 mL Mouth Rinse 10 times per day  . metoprolol tartrate  25 mg Oral BID  . pantoprazole  40 mg Oral Daily  . potassium chloride  40 mEq Per Tube BID  . sotalol  80 mg Per Tube Q12H   Pertinent  Labs/Diagnostics: Sodium 129 Potassium 3.3 Glucose 122 Calcium 8.7 White blood cell count 14 APTT 69  Ct Head Wo Contrast Result Date: 07/23/2019  IMPRESSION: No acute abnormality. Chronic microvascular ischemic change. Electronically Signed   By: Inge Rise M.D.   On: 07/23/2019 13:00   Ct Cervical Spine Wo Contrast  Result Date: 07/23/2019  IMPRESSION: No acute abnormality. Degenerative disc disease C5-6. Electronically Signed   By: Inge Rise M.D.   On: 07/23/2019 13:02   Mr Angio Head Wo Contrast  Result Date: 07/23/2019  IMPRESSION: 1. No acute intracranial abnormality. 2. Stable diffuse white matter disease, moderately advanced for age. 3. Normal MRA circle-of-Willis without significant proximal stenosis, aneurysm, or branch vessel occlusion. 4. Normal MRA of the neck without contrast. No significant stenosis. Electronically Signed   By: San Morelle M.D.   On: 07/23/2019 17:31   Mr Angio Neck Wo Contrast  Result Date: 07/23/2019 CLINICAL DATA:  Altered level of consciousness (LOC), unexplained Unresponsive, weakness in left arm and left leg compared to right.; Stroke, follow up EXAM: MRI HEAD WITHOUT CONTRAST MRA HEAD WITHOUT CONTRAST MRA NECK WITHOUT CONTRAST TECHNIQUE:IMPRESSION: 1. No acute intracranial abnormality. 2. Stable diffuse white matter disease, moderately advanced for age. 3. Normal MRA circle-of-Willis without significant proximal stenosis, aneurysm, or branch vessel occlusion. 4. Normal MRA of the neck without contrast. No significant stenosis. Electronically Signed   By: San Morelle M.D.   On: 07/23/2019 17:31   Mr Brain Wo Contrast Result Date: 07/23/2019 CLINICAL DATA:  Altered level of  consciousness (LOC), unexplained Unresponsive, weakness in left arm and left leg compared to right.; Stroke, follow up EXAM: MRI HEAD WITHOUT CONTRAST MRA HEAD WITHOUT CONTRAST MRA NECK WITHOUT CONTRAST TECHNIQUE:IMPRESSION: 1. No acute intracranial  abnormality. 2. Stable diffuse white matter disease, moderately advanced for age. 3. Normal MRA circle-of-Willis without significant proximal stenosis, aneurysm, or branch vessel occlusion. 4. Normal MRA of the neck without contrast. No significant stenosis. Electronically Signed   By: San Morelle M.D.   On: 07/23/2019 17:31   EEG.  Continues slow, generalized as of 07/23/2019  Etta Quill PA-C [facility] [phone]   Assessment: 84 year old man past history of sleep apnea, CKD 3, hypertension, hyperlipidemia, coronary artery disease, atrial fibrillation on Eliquis with progressive cognitive decline over the past few months and also poor p.o. intake, brought in after being found down at the independent living facility where he lives. Question of left-sided weakness in the emergency room.  Had to be emergently intubated for airway protection.  Initial neurological exam was under heavy sedation and chemical paralytics and was very limited. Today's exam appears to be nonfocal. He is following commands and will probably be extubated per the PCCM team. We will continue to follow him after the exam I suspect that his presenting episode was likely due to some sort of syncopal episode or fainting spell.  EEG has been unremarkable thus far.  Formal report pending.  Impression -Evaluate for syncope/fainting spell -Toxic metabolic encephalopathy - Evaluate for seizure-less likely given normal MRI and EEG unremarkable for any focal/lateralizing signs.  - Cognitive decline and failure to thrive outpatient-will need follow-up as an outpatient.   Recommendations - Extubation per PCCM - No need for antiepileptics -If the LTM formal read as negative for seizures, can be discontinued. -Correction of toxic metabolic derangements per primary team - Has a mild white count but no clear source.  Do not think this is CNS infection.  Continue to hold the Eliquis and use the heparin drip in  case postextubation exam leads [place] to need an LP, although less likely. -Of the post extubation exam is consistent with his baseline, Eliquis can be resumed and heparin can be stopped at that time.  Discussed the case with Dr. Tamala Julian, PCCM. 07/24/2019, 8:57 AM  Attending Neurohospitalist Addendum Patient seen and examined with APP/Resident. Agree with the history and physical as documented above. Agree with the plan as documented, which I helped formulate. I have independently reviewed the chart, obtained history, review of systems and examined the patient.I have personally reviewed pertinent head/neck/spine imaging (CT/MRI). Please feel free to call with any questions. --- Amie Portland, MD [facility] Pager: [phone]  If 7pm to 7am, please call on call as listed on AMION.  CRITICAL CARE ATTESTATION Performed by: Amie Portland, MD Total critical care time: 30 minutes Critical care time was exclusive of separately billable procedures and treating other patients and/or supervising APPs/Residents/Students Critical care was necessary to treat or prevent imminent or life-threatening deterioration due to concern for seizure, toxic metabolic encephalopathy. This patient is critically ill and at significant risk for neurological worsening and/or death and care requires constant monitoring. Critical care was time spent personally by me on the following activities: development of treatment plan with patient and/or surrogate as well as nursing, discussions with consultants, evaluation of patient's response to treatment, examination of patient, obtaining history from patient or surrogate, ordering and performing treatments and interventions, ordering and review of laboratory studies, ordering and review of radiographic studies, pulse oximetry, re-evaluation of  patient's condition, participation in multidisciplinary rounds and medical decision making of high complexity in the care of this  patient.

## 2019-07-24 NOTE — Procedures (Signed)
Extubation Procedure Note  Patient Details:   Name: Duane [name] DOB: 1935-07-28 MRN: [id number]   Airway Documentation:    Vent end date: 07/24/19 Vent end time: 0852   Evaluation  O2 sats: stable throughout Complications: No apparent complications Patient did tolerate procedure well. Bilateral Breath Sounds: Clear, Diminished   Yes, pt could speak after extubation.  Pt extubated to room air.  Earney Navy 07/24/2019, 8:54 AM

## 2019-07-24 NOTE — Progress Notes (Signed)
Patient pulled out IV a bled a fair bit, HD stable, will check H/H around noon.

## 2019-07-24 NOTE — Evaluation (Signed)
Physical Therapy Evaluation Patient Details Name: QUAMEL GREANEY MRN: [id number] DOB: [birth year] Today's Date: 07/24/2019   History of Present Illness  patient is an 84 year old male admitted after being found down by staff at senior independent living home. Patient admitted with AMS, encephalopathy, intubated to protect his airway. Extubated now. Questionable left sided weakness, testing negative for CVA.  Clinical Impression  Patient received in bed, daughter present. Patient confused.Limited movement/mobility in B UE and LE, Left side more impraired. Stiffness noted in all extremities with PROM. Patient requires max assist for bed mobility at this time due to weakness, confusion. Transfers and gait not attempted. Patient will benefit from continued skilled PT while here to improve functional independence and strength.       Follow Up Recommendations SNF    Equipment Recommendations  Other (comment)(TBD)    Recommendations for Other Services       Precautions / Restrictions Precautions Precautions: Fall Restrictions Weight Bearing Restrictions: No      Mobility  Bed Mobility Overal bed mobility: Needs Assistance Bed Mobility: Supine to Sit;Sit to Supine     Supine to sit: Max assist Sit to supine: Max assist      Transfers                 General transfer comment: deferred due to confusion this day, difficulty moving extremities  Ambulation/Gait             General Gait Details: deferred  Stairs            Wheelchair Mobility    Modified Rankin (Stroke Patients Only)       Balance Overall balance assessment: Needs assistance Sitting-balance support: Feet supported Sitting balance-Leahy Scale: Poor                                       Pertinent Vitals/Pain Pain Assessment: Faces Faces Pain Scale: Hurts a little bit Pain Location: LEs with movement Pain Descriptors / Indicators: Discomfort Pain Intervention(s):  Monitored during session;Limited activity within patient's tolerance;Repositioned    Home Living Family/patient expects to be discharged to:: Other (Comment)                 Additional Comments: Lives in senior independent living.    Prior Function Level of Independence: Independent         Comments: daughter reports he lives alone, was not using a device, however was declining for the last 2 months. States he has not been feeling well therefore not moving as much and not eating well.     Hand Dominance   Dominant Hand: Right    Extremity/Trunk Assessment   Upper Extremity Assessment Upper Extremity Assessment: Generalized weakness    Lower Extremity Assessment Lower Extremity Assessment: Generalized weakness       Communication   Communication: No difficulties  Cognition Arousal/Alertness: Awake/alert   Overall Cognitive Status: Impaired/Different from baseline Area of Impairment: Orientation;Attention;Following commands;Safety/judgement;Awareness;Problem solving                 Orientation Level: Disoriented to;Place;Time;Situation Current Attention Level: Focused   Following Commands: Follows one step commands inconsistently Safety/Judgement: Decreased awareness of safety;Decreased awareness of deficits   Problem Solving: Slow processing;Requires verbal cues;Requires tactile cues        General Comments      Exercises Other Exercises Other Exercises: PROM of all extremities. Very little/limited volitional movement  of any extremity. R side stronger than left.   Assessment/Plan    PT Assessment Patient needs continued PT services  PT Problem List Decreased strength;Decreased mobility;Decreased safety awareness;Decreased activity tolerance;Decreased balance;Decreased cognition;Pain       PT Treatment Interventions DME instruction;Therapeutic activities;Gait training;Therapeutic exercise;Patient/family education;Functional mobility  training;Neuromuscular re-education;Balance training    PT Goals (Current goals can be found in the Care Plan section)  Acute Rehab PT Goals Patient Stated Goal: none stated PT Goal Formulation: With patient/family Time For Goal Achievement: 08/04/19 Potential to Achieve Goals: Fair    Frequency Min 3X/week   Barriers to discharge Decreased caregiver support      Co-evaluation               AM-PAC PT "6 Clicks" Mobility  Outcome Measure Help needed turning from your back to your side while in a flat bed without using bedrails?: Total Help needed moving from lying on your back to sitting on the side of a flat bed without using bedrails?: Total Help needed moving to and from a bed to a chair (including a wheelchair)?: Total Help needed standing up from a chair using your arms (e.g., wheelchair or bedside chair)?: Total Help needed to walk in hospital room?: Total Help needed climbing 3-5 steps with a railing? : Total 6 Click Score: 6    End of Session   Activity Tolerance: Patient tolerated treatment well Patient left: in bed;with bed alarm set;with family/visitor present Nurse Communication: Mobility status PT Visit Diagnosis: Muscle weakness (generalized) (M62.81);Other abnormalities of gait and mobility (R26.89);History of falling (Z91.81);Difficulty in walking, not elsewhere classified (R26.2)    Time: 1400-1430 PT Time Calculation (min) (ACUTE ONLY): 30 min   Charges:   PT Evaluation $PT Eval Moderate Complexity: 1 Mod PT Treatments $Therapeutic Exercise: 8-22 mins        Nasrin Lanzo, PT, GCS 07/24/19,2:56 PM

## 2019-07-24 NOTE — Progress Notes (Signed)
LTM maint complete - no skin breakdown under:  FP1 FP2 F7 F8 

## 2019-07-24 NOTE — H&P (Signed)
NAME:  Duane [name], MRN:  [id number], DOB:  [birth year], LOS: 1 ADMISSION DATE:  07/23/2019, CONSULTATION DATE:  07/24/19 REFERRING MD:  Marda Stalker, CHIEF COMPLAINT:  encephalopathy   Brief History   84 year old gentleman found down in his independent living apartment.  Possibly started on baclofen for low back pain at urgent care yesterday.  Chronic memory deficits on Aricept, but no previous dementia diagnosis.  Obtunded and not protecting his airway in the ED without rapidly reversible cause identified.  Intubated uneventfully in the ED.  History of present illness   Duane [name] is an 84 year old gentleman with a past medical history of atrial fibrillation on Eliquis, OSA, myocardial infarction with stenting, diabetes mellitus on oral hypoglycemics, CKD 3, and memory deficits on Aricept.  His family was worried about him today, and when he would not answer the phone they asked employees at his senior living community to check on him, where he was found down on the ground.  He was seen at urgent care yesterday for low back pain, was felt not to have a urinary tract infection, and was started on baclofen.  He was seen in the emergency department in [facility] 3 days prior for altered mental status, but had a normal work-up.  He improved with IV fluids.  At all of these visits he has had a normal blood sugar.  With EMS blood glucose was 160.  Other than baclofen, his family is not aware that he is started any new medications recently.  His family describes waxing and waning symptoms going on for at least several weeks where he was less alert and interactive, sometimes aphasic, which is a deterioration since last spring when he was much more active, including using chainsaws and working in his wood shop.  His history was provided by his daughter Duane [name] and his brother Duane [name] in the ED.  Upon arrival to the ED he remained nonresponsive.  He had unilateral deficits when withdrawing to pain, which  were reportedly new per his family.  CT head without acute abnormalities.  Intubated for airway protection.  Past Medical History  CAD, MI, previous stenting-DES to RCA in 04/2019 at [facility] Diabetes on oral meds Memory deficits on Aricept Hypertension CKD 3  Significant Hospital Events   Intubated in the ED on 07/23/2019  Consults:  Neurology  Procedures:  Intubation  Significant Diagnostic Tests:  MRI/MRA head/neck unrevealing EEG nonfocal  Micro Data:  Blood 07/23/2019>> Urine 07/23/2019>> Respiratory 07/23/2019>>  Antimicrobials:  Cefepime 07/23/2019>> Vancomycin 07/23/2019>>  Interim history/subjective:  No events, patient awake on the ventilator, shaking his head to questions but not consistently following commands.  Bps have been high.  1L UoP.  Objective   Blood pressure (!) 171/72, pulse 86, temperature 98.3 F (36.8 C), temperature source Oral, resp. rate 17, height 5\' 11"  (1.803 m), weight 93.1 kg, SpO2 95 %.    Vent Mode: PSV;CPAP FiO2 (%):  [40 %] 40 % Set Rate:  [14 bmp] 14 bmp Vt Set:  [600 mL] 600 mL PEEP:  [5 cmH20] 5 cmH20 Pressure Support:  [5 cmH20] 5 cmH20 Plateau Pressure:  [11 cmH20-18 cmH20] 15 cmH20   Intake/Output Summary (Last 24 hours) at 07/24/2019 0829 Last data filed at 07/24/2019 0800 Gross per 24 hour  Intake 3648.91 ml  Output 1100 ml  Net 2548.91 ml   Filed Weights   07/23/19 1400 07/23/19 1710 07/24/19 0500  Weight: 95.4 kg 91.2 kg 93.1 kg    Examination: GEN: elderly man  in NAD HEENT: ETT in place without secretions CV: RRR, ext warm PULM: Clear, no accessory muscle use GI: Soft, +BS EXT: +muscle wasting, no edema NEURO: Moves all 4 ext occasionally to command, tracking wit eyes PSYCH: RASS 0 SKIN: No rashes   Assessment & Plan:  # Acute encephalopathy-  Suspicion more for polypharmacy than anything else.  Spot EEG/MRI unrevealing.  Long term EEG read pending.  Doing better # Resp failure due to acute encephalopathy #  Mild lactic acidosis and question of sepsis- cannot see source for this, DC abx # CAD, afib, recent stent # Hyponatremia- chronic issue, about at baseline # FTT as OP, noted cognitive decline  - Extubate, swallow screen - DC heparin gtt, resume NoAC - f/u LTVM EEG - No sedating meds - Re-eval neuropsych condition once extubated - See orders for other care  Best practice:  Diet: N.p.o. Pain/Anxiety/Delirium protocol (if indicated): DC all VAP protocol (if indicated): DC DVT prophylaxis: NoAC today hopefully GI prophylaxis: would do PPI since he is on triple therapy, high risk for stress ulcers Glucose control: SSI Mobility: PT/OT consult Code Status: Full Family Communication: will call daughter today Disposition: ICU, potentially progressive later today if doing well  31 mins CC time  Erskine Emery MD [facility] 07/24/2019 8:38 AM Personal pager: [phone] If unanswered, please page CCM On-call: [phone]

## 2019-07-24 NOTE — Progress Notes (Signed)
eLink Physician-Brief Progress Note Patient Name: Duane [name] DOB: December 09, 1934 MRN: [id number]   Date of Service  07/24/2019  HPI/Events of Note  Agitation - Request for bilateral soft wrist restraints.   eICU Interventions  Will order: 1. Bilateral soft wrist restraints.      Intervention Category Major Interventions: Delirium, psychosis, severe agitation - evaluation and management  Sommer,Steven Eugene 07/24/2019, 10:57 PM

## 2019-07-24 NOTE — Procedures (Signed)
Patient Name: Duane [name]  MRN: [id number]  Epilepsy Attending: Lora Havens  Referring Physician/Provider: Etta Quill, PA Duration: 07/23/2019 1845 to 07/24/2019 1339  Patient history: 84yo m with ams. EEG to evaluate for seizure.  Level of alertness: comatose  AEDs during EEG study: None  Technical aspects: This EEG study was done with scalp electrodes positioned according to the 10-20 International system of electrode placement. Electrical activity was acquired at a sampling rate of 500Hz  and reviewed with a high frequency filter of 70Hz  and a low frequency filter of 1Hz . EEG data were recorded continuously and digitally stored.   DESCRIPTION: EEG showed continuous generalized polymorphic 2-4hz  theta-delta slowing, at times with triphasic morphology, maximal bifrontal. No clear posterior dominant rhythm was seen. Hyperventilation and photic stimulation were not performed.  ABNORMALITY: 1. Continuous slow, generalized 2. Triphasic wave, generalized, maximal bifrontal  IMPRESSION: This study is suggestive of moderate to severe diffuse encephalopathy, could be secondary to toxic-metabolic etiology. No seizures or epileptiform discharges were seen throughout the recording.

## 2019-07-24 NOTE — Progress Notes (Signed)
[place] for Heparin Indication: atrial fibrillation (Apixaban PTA on hold)  Allergies  Allergen Reactions  . Bee Venom Anaphylaxis  . Contrast Media [Iodinated Diagnostic Agents] Anaphylaxis  . Iodine-131 Anaphylaxis  . Metrizamide Anaphylaxis  . Metronidazole Anaphylaxis  . Atorvastatin Other (See Comments)    Aching in legs  . Lisinopril Cough    Patient Measurements: Height: 5\' 11"  (180.3 cm) Weight: 205 lb 4 oz (93.1 kg) IBW/kg (Calculated) : 75.3 Heparin Dosing Weight: 91.2kg  Vital Signs: Temp: 96.9 F (36.1 C) (09/07 2014) Temp Source: Axillary (09/07 2014) BP: 180/85 (09/08 0505) Pulse Rate: 71 (09/08 0505)  Labs: Recent Labs    07/23/19 1207 07/23/19 1404 07/23/19 1447 07/23/19 1840 07/24/19 0438  HGB 13.2 12.9*  --   --  12.4*  HCT 38.8* 38.0*  --   --  35.7*  PLT 235  --   --   --  190  APTT  --   --   --  39* 69*  LABPROT  --   --   --  14.9  --   INR  --   --   --  1.2  --   HEPARINUNFRC  --   --   --  1.50* 1.18*  CREATININE 1.58*  --  1.24 1.27* 1.04  CKTOTAL  --   --  1,617*  --   --   TROPONINIHS  --   --  17  --   --     Estimated Creatinine Clearance: 63.8 mL/min (by C-G formula based on SCr of 1.04 mg/dL).    Assessment: 84 yo M with AMS.  Pt was found down by assisted living staff, altered, covered in urine.  Transferred to [facility].  Intubated in ED.  Daughter reports cognitive decline over the past few months and frequent episodes of difficulty with word finding over the past few weeks.  Pt on Apixaban PTA.  No evidence of acute stroke on MRI.  To start heparin while NPO (intubated).  Unknown last dose of Apixaban.  Baseline heparin level is elevated showing recent apixaban use.  aPTT 39, INR 1.2.  Suspect last Apixaban dose was 9/6 PM. Will start heparin and monitor with aPTTs for now until heparin levels begin to correlate. APTT 69 sec, HL 1.18 Goal of Therapy:  Heparin level 0.3-0.7 units/ml aPTT  66-102 seconds Monitor platelets by anticoagulation protocol: Yes   Plan:  Heparin infusion at 1300 units/hr.  Heparin level and aPTT in 8 hours to confirm Daily heparin level, aPTT, and CBC while on heparin.  Thanks for allowing pharmacy to be a part of this patient's care.  Excell Seltzer, PharmD Clinical Pharmacist

## 2019-07-25 LAB — BASIC METABOLIC PANEL
Anion gap: 13 (ref 5–15)
BUN: 10 mg/dL (ref 8–23)
CO2: 21 mmol/L — ABNORMAL LOW (ref 22–32)
Calcium: 8.6 mg/dL — ABNORMAL LOW (ref 8.9–10.3)
Chloride: 94 mmol/L — ABNORMAL LOW (ref 98–111)
Creatinine, Ser: 1.12 mg/dL (ref 0.61–1.24)
GFR calc Af Amer: >60 mL/min (ref >60)
GFR calc non Af Amer: >60 mL/min (ref >60)
Glucose, Bld: 160 mg/dL — ABNORMAL HIGH (ref 70–99)
Potassium: 3.5 mmol/L (ref 3.5–5.1)
Sodium: 128 mmol/L — ABNORMAL LOW (ref 135–145)

## 2019-07-25 LAB — CBC
HCT: 32.9 % — ABNORMAL LOW (ref 39.0–52.0)
Hemoglobin: 11 g/dL — ABNORMAL LOW (ref 13.0–17.0)
MCH: 27.9 pg (ref 26.0–34.0)
MCHC: 33.4 g/dL (ref 30.0–36.0)
MCV: 83.5 fL (ref 80.0–100.0)
Platelets: 204 10*3/uL (ref 150–400)
RBC: 3.94 MIL/uL — ABNORMAL LOW (ref 4.22–5.81)
RDW: 14.6 % (ref 11.5–15.5)
WBC: 10.8 10*3/uL — ABNORMAL HIGH (ref 4.0–10.5)
nRBC: 0 % (ref 0.0–0.2)

## 2019-07-25 LAB — GLUCOSE, CAPILLARY
Glucose-Capillary: 141 mg/dL — ABNORMAL HIGH (ref 70–99)
Glucose-Capillary: 157 mg/dL — ABNORMAL HIGH (ref 70–99)
Glucose-Capillary: 207 mg/dL — ABNORMAL HIGH (ref 70–99)
Glucose-Capillary: 157 mg/dL — ABNORMAL HIGH (ref 70–99)
Glucose-Capillary: 142 mg/dL — ABNORMAL HIGH (ref 70–99)

## 2019-07-25 LAB — MAGNESIUM: Magnesium: 1.8 mg/dL (ref 1.7–2.4)

## 2019-07-25 MED ORDER — DILTIAZEM HCL-DEXTROSE 100-5 MG/100ML-% IV SOLN (PREMIX)
5.0000 mg/h | INTRAVENOUS | Status: DC
  Administered 2019-07-25: 5 mg/h via INTRAVENOUS
  Filled 2019-07-25: qty 100

## 2019-07-25 MED ORDER — METOPROLOL TARTRATE 5 MG/5ML IV SOLN
2.5000 mg | INTRAVENOUS | Status: DC | PRN
  Administered 2019-07-25: 2.5 mg via INTRAVENOUS
  Filled 2019-07-25: qty 5

## 2019-07-25 MED ORDER — SOTALOL HCL 80 MG PO TABS
80.0000 mg | ORAL_TABLET | Freq: Two times a day (BID) | ORAL | Status: DC
  Administered 2019-07-25 – 2019-07-27 (×5): 80 mg via ORAL
  Filled 2019-07-25 (×6): qty 1

## 2019-07-25 MED ORDER — MEMANTINE HCL 5 MG PO TABS
5.0000 mg | ORAL_TABLET | Freq: Every day | ORAL | Status: DC
  Administered 2019-07-25 – 2019-07-27 (×3): 5 mg via ORAL
  Filled 2019-07-25 (×3): qty 1

## 2019-07-25 MED ORDER — MAGNESIUM SULFATE 2 GM/50ML IV SOLN
2.0000 g | Freq: Once | INTRAVENOUS | Status: AC
  Administered 2019-07-25: 2 g via INTRAVENOUS
  Filled 2019-07-25: qty 50

## 2019-07-25 MED ORDER — APIXABAN 5 MG PO TABS
5.0000 mg | ORAL_TABLET | Freq: Two times a day (BID) | ORAL | Status: DC
  Administered 2019-07-25 – 2019-07-27 (×5): 5 mg via ORAL
  Filled 2019-07-25 (×5): qty 1

## 2019-07-25 MED ORDER — POTASSIUM CHLORIDE 20 MEQ/15ML (10%) PO SOLN
40.0000 meq | Freq: Once | ORAL | Status: AC
  Administered 2019-07-25: 40 meq
  Filled 2019-07-25: qty 30

## 2019-07-25 MED ORDER — WHITE PETROLATUM EX OINT
TOPICAL_OINTMENT | CUTANEOUS | Status: AC
  Administered 2019-07-25: 1
  Filled 2019-07-25: qty 28.35

## 2019-07-25 MED ORDER — METOPROLOL TARTRATE 50 MG PO TABS
50.0000 mg | ORAL_TABLET | Freq: Two times a day (BID) | ORAL | Status: DC
  Administered 2019-07-25 – 2019-07-27 (×5): 50 mg via ORAL
  Filled 2019-07-25 (×5): qty 1

## 2019-07-25 MED ORDER — ASPIRIN 81 MG PO CHEW
81.0000 mg | CHEWABLE_TABLET | Freq: Every day | ORAL | Status: DC
  Administered 2019-07-25 – 2019-07-26 (×2): 81 mg via ORAL
  Filled 2019-07-25 (×2): qty 1

## 2019-07-25 NOTE — Discharge Instructions (Signed)
Information on my medicine - ELIQUIS (apixaban)  This medication education was reviewed with me or my healthcare representative as part of my discharge preparation.  The pharmacist that spoke with me during my hospital stay was:  Abeer Iversen Z  Dezerae Freiberger,, RPH  Why was Eliquis prescribed for you? Eliquis was prescribed for you to reduce the risk of forming blood clots that can cause a stroke if you have a medical condition called atrial fibrillation (a type of irregular heartbeat) OR to reduce the risk of a blood clots forming after orthopedic surgery.  What do You need to know about Eliquis ? Take your Eliquis TWICE DAILY - one tablet in the morning and one tablet in the evening with or without food.  It would be best to take the doses about the same time each day.  If you have difficulty swallowing the tablet whole please discuss with your pharmacist how to take the medication safely.  Take Eliquis exactly as prescribed by your doctor and DO NOT stop taking Eliquis without talking to the doctor who prescribed the medication.  Stopping may increase your risk of developing a new clot or stroke.  Refill your prescription before you run out.  After discharge, you should have regular check-up appointments with your healthcare provider that is prescribing your Eliquis.  In the future your dose may need to be changed if your kidney function or weight changes by a significant amount or as you get older.  What do you do if you miss a dose? If you miss a dose, take it as soon as you remember on the same day and resume taking twice daily.  Do not take more than one dose of ELIQUIS at the same time.  Important Safety Information A possible side effect of Eliquis is bleeding. You should call your healthcare provider right away if you experience any of the following: ? Bleeding from an injury or your nose that does not stop. ? Unusual colored urine (red or dark brown) or unusual colored stools (red or  black). ? Unusual bruising for unknown reasons. ? A serious fall or if you hit your head (even if there is no bleeding).  Some medicines may interact with Eliquis and might increase your risk of bleeding or clotting while on Eliquis. To help avoid this, consult your healthcare provider or pharmacist prior to using any new prescription or non-prescription medications, including herbals, vitamins, non-steroidal anti-inflammatory drugs (NSAIDs) and supplements.  This website has more information on Eliquis (apixaban): [email or web address].

## 2019-07-25 NOTE — Progress Notes (Signed)
eLink Physician-Brief Progress Note Patient Name: Duane [name] DOB: March 03, 1935 MRN: [id number]   Date of Service  07/25/2019  HPI/Events of Note  SV Tachycardia - HR 130's to 150's suggests AFIB. Refused Metoprolol dose earlier.   eICU Interventions  Will order: 1. 12 Lead EKG STAT.  2. BMP and Mg++ level STAT.  3. Metoprolol 2.5 mg IV Q 3 hours PRN HR > 115.     Intervention Category Major Interventions: Arrhythmia - evaluation and management  [name],Duane Eugene 07/25/2019, 4:26 AM

## 2019-07-25 NOTE — Progress Notes (Signed)
Chaplain was making rounds and stopped in to say hello to Duane [name]. Duane [name] was very pleasant and delighted to see the chaplain. Duane [name] said he was expecting his daughter Duane [name], who is his angel and is always running late. Then Duane [name] asked to get up and go poop. Chaplain asked Duane [name] to stay put and allow her to notify his nurse so that the staff could safely get him to the rest room. Duane [name] agreed to wait in bed for the nurse. Chaplain remains available per request.  Chaplain Resident, Evelene Croon, Gerilyn Pilgrim Pager # [phone]

## 2019-07-25 NOTE — Progress Notes (Signed)
eLink Physician-Brief Progress Note Patient Name: Duane [name] DOB: 09/18/1935 MRN: [id number]   Date of Service  07/25/2019  HPI/Events of Note  AFIB with RVR - Ventricular rate = 142. K+ = 3.5, Mg++ = 1.8 and Creatinine = 1.12. BP = 144/86.  eICU Interventions  Will order: 1. Cardizem IV infusion. Titrate to HR = 65-105. 2. Replace Mg++ and K+.      Intervention Category Major Interventions: Arrhythmia - evaluation and management  Sommer,Steven Eugene 07/25/2019, 6:34 AM

## 2019-07-25 NOTE — Progress Notes (Deleted)
NEUROLOGY PROGRESS NOTE  Subjective: With initial conversation he immediately states that he did not do anything wrong.  Patient's complaint is of a headache at this time.  Patient states that he is not feeling well.  Patient remains confused and states he wants to go downtown.  He is aware that he had a stroke last week.  Exam: Vitals:   07/25/19 0738 07/25/19 0800  BP:  116/74  Pulse:  (!) 149  Resp:  19  Temp: 98 F (36.7 C)   SpO2:  99%    Physical Exam   HEENT-  Normocephalic, no lesions, without obvious abnormality.  Normal external eye and conjunctiva.   Extremities- Warm, dry and intact Musculoskeletal-no joint tenderness, deformity or swelling Skin-warm and dry, no hyperpigmentation, vitiligo, or suspicious lesions    Neuro:  Mental Status: Alert, slightly paranoid.  Speech fluent without evidence of aphasia.  Able to follow 3 step commands without difficulty.  Is aware that he had a stroke last week.  Asking for his bilateral hand mittens to be taken off, as he feels he is supposed to go downtown. Cranial Nerves: II:  Visual fields grossly normal,  III,IV, VI: ptosis not present, extra-ocular motions intact bilaterally pupils equal, round, reactive to light and accommodation V,VII: smile symmetric,  VIII: hearing decreased bilaterally XII: midline tongue extension Motor: Right : Upper extremity   5/5    Left:     Upper extremity   5/5  Lower extremity   5/5     Lower extremity   5/5 Tone and bulk:normal tone throughout; no atrophy noted Sensory: Pinprick and light touch intact throughout, bilaterally     Medications:  Scheduled: . apixaban  5 mg Oral BID  . aspirin  81 mg Oral Daily  . Chlorhexidine Gluconate Cloth  6 each Topical Daily  . clopidogrel  75 mg Oral Daily  . insulin aspart  0-15 Units Subcutaneous Q4H  . mouth rinse  15 mL Mouth Rinse BID  . memantine  5 mg Oral Daily  . metoprolol tartrate  50 mg Oral BID  . pantoprazole  40 mg Oral Daily   . ramelteon  8 mg Oral QHS  . sotalol  80 mg Oral Q12H   Continuous: . diltiazem (CARDIZEM) infusion 10 mg/hr (07/25/19 0800)  . magnesium sulfate bolus IVPB      Pertinent Labs/Diagnostics: -Sodium 128 -Glucose 160 -WBC 10.8  EEG-study suggestive of moderately to severe diffuse encephalopathy which could be seen in toxic metabolic etiology.  No seizures or epileptiform activity was recorded.   Ct Head Wo Contrast Result Date: 07/23/2019  IMPRESSION: No acute abnormality. Chronic microvascular ischemic change. Electronically Signed   By: Inge Rise M.D.   On: 07/23/2019 13:00    MRI of head/Mr Angio Neck Wo Contrast/MRA of head . IMPRESSION: 1. No acute intracranial abnormality. 2. Stable diffuse white matter disease, moderately advanced for age. 3. Normal MRA circle-of-Willis without significant proximal stenosis, aneurysm, or branch vessel occlusion. 4. Normal MRA of the neck without contrast. No significant stenosis. Electronically Signed   By: San Morelle M.D.   On: 07/23/2019 17:31     Etta Quill PA-C [facility] [id number]   Assessment: 84 year old male with altered mental status which is now improved.  Most likely etiology is toxic metabolic and sedating medications.  EEG and MRI are within normal limits showing no CNS etiology.  At this point patient continues to improve.  Remains paranoid and confused.   Recommendations: Continue to follow  metabolic issues. At this point no further recommendations per neurology.    07/25/2019, 9:01 AM

## 2019-07-25 NOTE — Progress Notes (Signed)
Neurology Progress Note   S:// No acute changes.   O:// Current vital signs: BP 113/72   Pulse (!) 122   Temp 98 F (36.7 C) (Oral)   Resp 18   Ht 5\' 11"  (1.803 m)   Wt 91.9 kg   SpO2 98%   BMI 28.26 kg/m  Vital signs in last 24 hours: Temp:  [97.8 F (36.6 C)-100.4 F (38 C)] 98 F (36.7 C) (09/09 0738) Pulse Rate:  [83-149] 122 (09/09 0915) Resp:  [13-22] 18 (09/09 0915) BP: (113-180)/(59-126) 113/72 (09/09 0915) SpO2:  [94 %-99 %] 98 % (09/09 0915) Weight:  [91.9 kg] 91.9 kg (09/09 0452) Neurological exam Awake alert, oriented to self and the fact that he is in the hospital.  Could not tell me the name of the hospital. Speech is clear.  Attention concentration is poor. Naming comprehension is intact.  Repetition is intact. Cranial nerves: Pupils equal round react light, extraocular movements intact, visual fields full, facial sensation intact, face symmetric, auditory acuity decreased bilaterally, palate midline, shoulder shrug intact, tongue midline. Motor exam: He is able to move both upper and lower extremities to command antigravity.  Has some mildly increased tone-unclear if it is true hypertonia or paratonia. Sensory exam: Intact to light touch with some neuropathy and hyperesthesia in the lower extremities. Coordination: Intact finger-nose-finger testing.   Medications  Current Facility-Administered Medications:  .  acetaminophen (TYLENOL) tablet 650 mg, 650 mg, Oral, Q4H PRN, Julian Hy, DO, 650 mg at 07/25/19 [id number] .  apixaban (ELIQUIS) tablet 5 mg, 5 mg, Oral, BID, Candee Furbish, MD, 5 mg at 07/25/19 0947 .  aspirin chewable tablet 81 mg, 81 mg, Oral, Daily, Candee Furbish, MD, 81 mg at 07/25/19 0947 .  Chlorhexidine Gluconate Cloth 2 % PADS 6 each, 6 each, Topical, Daily, Julian Hy, DO, 6 each at 07/25/19 0905 .  clopidogrel (PLAVIX) tablet 75 mg, 75 mg, Oral, Daily, Julian Hy, DO, 75 mg at 07/25/19 0947 .  diltiazem (CARDIZEM) 100 mg in  dextrose 5% 116mL (1 mg/mL) infusion, 5-15 mg/hr, Intravenous, Titrated, Anders Simmonds, MD, Last Rate: 10 mL/hr at 07/25/19 0900, 10 mg/hr at 07/25/19 0900 .  hydrALAZINE (APRESOLINE) injection 10 mg, 10 mg, Intravenous, Q4H PRN, Anders Simmonds, MD, 10 mg at 07/24/19 0503 .  insulin aspart (novoLOG) injection 0-15 Units, 0-15 Units, Subcutaneous, Q4H, Julian Hy, DO, 3 Units at 07/25/19 [id number] .  magnesium sulfate IVPB 2 g 50 mL, 2 g, Intravenous, Once, Anders Simmonds, MD, Last Rate: 50 mL/hr at 07/25/19 1002, 2 g at 07/25/19 1002 .  MEDLINE mouth rinse, 15 mL, Mouth Rinse, BID, Candee Furbish, MD, 15 mL at 07/25/19 0949 .  memantine (NAMENDA) tablet 5 mg, 5 mg, Oral, Daily, Candee Furbish, MD, 5 mg at 07/25/19 0947 .  metoprolol tartrate (LOPRESSOR) injection 2.5 mg, 2.5 mg, Intravenous, Q3H PRN, Anders Simmonds, MD, 2.5 mg at 07/25/19 0450 .  metoprolol tartrate (LOPRESSOR) tablet 50 mg, 50 mg, Oral, BID, Candee Furbish, MD, 50 mg at 07/25/19 0947 .  pantoprazole (PROTONIX) EC tablet 40 mg, 40 mg, Oral, Daily, Candee Furbish, MD, 40 mg at 07/25/19 0947 .  ramelteon (ROZEREM) tablet 8 mg, 8 mg, Oral, QHS, Candee Furbish, MD .  sotalol (BETAPACE) tablet 80 mg, 80 mg, Oral, Q12H, Candee Furbish, MD, 80 mg at 07/25/19 0947 Labs CBC    Component Value Date/Time   WBC 10.8 (H) 07/25/2019 0316  RBC 3.94 (L) 07/25/2019 0316   HGB 11.0 (L) 07/25/2019 0316   HCT 32.9 (L) 07/25/2019 0316   PLT 204 07/25/2019 0316   MCV 83.5 07/25/2019 0316   MCH 27.9 07/25/2019 0316   MCHC 33.4 07/25/2019 0316   RDW 14.6 07/25/2019 0316   LYMPHSABS 1.3 07/23/2019 1207   MONOABS 1.1 (H) 07/23/2019 1207   EOSABS 0.0 07/23/2019 1207   BASOSABS 0.0 07/23/2019 1207    CMP     Component Value Date/Time   NA 128 (L) 07/25/2019 0519   K 3.5 07/25/2019 0519   CL 94 (L) 07/25/2019 0519   CO2 21 (L) 07/25/2019 0519   GLUCOSE 160 (H) 07/25/2019 0519   BUN 10 07/25/2019 0519   CREATININE 1.12  07/25/2019 0519   CALCIUM 8.6 (L) 07/25/2019 0519   PROT 7.5 07/23/2019 1207   ALBUMIN 4.1 07/23/2019 1207   AST 50 (H) 07/23/2019 1207   ALT 22 07/23/2019 1207   ALKPHOS 62 07/23/2019 1207   BILITOT 1.2 07/23/2019 1207   GFRNONAA >60 07/25/2019 0519   GFRAA >60 07/25/2019 0519   Imaging I have reviewed images in epic and the results pertinent to this consultation are: MRI brain negative for stroke n  Assessment: 84 year old man history of sleep apnea CKD 3 hypertension hyperlipidemia coronary artery disease atrial fibrillation on Eliquis with progressive cognitive decline over the past few weeks to months and poor p.o. intake was found down for an unknown duration of time. Overnight has had atrial fibrillation and heart rate issues. I suspect that he had a syncopal episode.  I cannot definitively rule out a seizure but the EEG was very unremarkable and I do not think putting him on an antiepileptic at this time is required. He did have some left-sided weakness yesterday but not today.  I suspect that there is a behavioral component with his cognitive decline which manifests as weakness or neglect as well.  No imaging or electrographic evidence of stroke or seizure to explain the weakness.  Impression: Toxic metabolic encephalopathy Evaluate for hypertensive urgency Evaluate for syncope Dementia Less likely seizure   Recommendations: No antiepileptics at this time Correction of toxic metabolic derangements per primary team as you are Continue Eliquis Outpatient neurological follow-up in 4 to 6 weeks after discharge If he has any witnessed seizure activity or stereotypical activity can consider antiepileptics at that time. Plan discussed with Dr. Tamala Julian on the unit. -- Amie Portland, MD [facility] Pager: [phone] If 7pm to 7am, please call on call as listed on AMION.  CRITICAL CARE ATTESTATION Performed by: Amie Portland, MD Total critical care time: 25  minutes Critical care time was exclusive of separately billable procedures and treating other patients and/or supervising APPs/Residents/Students Critical care was necessary to treat or prevent imminent or life-threatening deterioration due to toxic metabolic encephalopathy. This patient is critically ill and at significant risk for neurological worsening and/or death and care requires constant monitoring. Critical care was time spent personally by me on the following activities: development of treatment plan with patient and/or surrogate as well as nursing, discussions with consultants, evaluation of patient's response to treatment, examination of patient, obtaining history from patient or surrogate, ordering and performing treatments and interventions, ordering and review of laboratory studies, ordering and review of radiographic studies, pulse oximetry, re-evaluation of patient's condition, participation in multidisciplinary rounds and medical decision making of high complexity in the care of this patient.

## 2019-07-25 NOTE — Progress Notes (Addendum)
NAME:  Duane [name], MRN:  [id number], DOB:  23-Jan-1935, LOS: 2 ADMISSION DATE:  07/23/2019, CONSULTATION DATE:  07/25/19 REFERRING MD:  Marda Stalker, CHIEF COMPLAINT:  encephalopathy   Brief History   84 year old gentleman found down in his independent living apartment.  Possibly started on baclofen for low back pain at urgent care yesterday.  Chronic memory deficits on Aricept, but no previous dementia diagnosis.  Obtunded and not protecting his airway in the ED without rapidly reversible cause identified.  Intubated uneventfully in the ED.  History of present illness   Duane [name] is an 84 year old gentleman with a past medical history of atrial fibrillation on Eliquis, OSA, myocardial infarction with stenting, diabetes mellitus on oral hypoglycemics, CKD 3, and memory deficits on Aricept.  His family was worried about him today, and when he would not answer the phone they asked employees at his senior living community to check on him, where he was found down on the ground.  He was seen at urgent care yesterday for low back pain, was felt not to have a urinary tract infection, and was started on baclofen.  He was seen in the emergency department in [facility] 3 days prior for altered mental status, but had a normal work-up.  He improved with IV fluids.  At all of these visits he has had a normal blood sugar.  With EMS blood glucose was 160.  Other than baclofen, his family is not aware that he is started any new medications recently.  His family describes waxing and waning symptoms going on for at least several weeks where he was less alert and interactive, sometimes aphasic, which is a deterioration since last spring when he was much more active, including using chainsaws and working in his wood shop.  His history was provided by his daughter Duane [name] and his brother Duane [name] in the ED.  Upon arrival to the ED he remained nonresponsive.  He had unilateral deficits when withdrawing to pain, which  were reportedly new per his family.  CT head without acute abnormalities.  Intubated for airway protection.  Past Medical History  CAD, MI, previous stenting-DES to RCA in 04/2019 at [facility] Diabetes on oral meds Memory deficits on Aricept Hypertension CKD 3  Significant Hospital Events   Intubated in the ED on 07/23/2019  Consults:  Neurology  Procedures:  Intubation  Significant Diagnostic Tests:  MRI/MRA head/neck unrevealing EEG nonfocal  Micro Data:  Blood 07/23/2019>> Urine 07/23/2019>> Respiratory 07/23/2019>>  Antimicrobials:  Cefepime 07/23/2019>> Vancomycin 07/23/2019>>  Interim history/subjective:  Afib/RVR overnight, K/Mg being repleted, on dilt drip, sotalol, metoprolol Feeling better, less delirious, wants to eat Denies chest pain ro SOB  Objective   Blood pressure 116/74, pulse (!) 149, temperature 98 F (36.7 C), temperature source Oral, resp. rate 19, height 5\' 11"  (1.803 m), weight 91.9 kg, SpO2 99 %.    FiO2 (%):  [21 %] 21 %   Intake/Output Summary (Last 24 hours) at 07/25/2019 0848 Last data filed at 07/25/2019 0800 Gross per 24 hour  Intake 12.71 ml  Output 1475 ml  Net -1462.29 ml   Filed Weights   07/23/19 1710 07/24/19 0500 07/25/19 0452  Weight: 91.2 kg 93.1 kg 91.9 kg    Examination: GEN: elderly man in NAD HEENT: MMM, no thrush CV: Irregular, tachycardic, ext warm PULM: Clear, no accessory muscle use GI: Soft, +BS EXT: +muscle wasting, no edema NEURO: Moves all 4 ext occasionally to command, tracking wit eyes PSYCH: RASS 0 SKIN: No  rashes   Assessment & Plan:  # Acute encephalopathy in setting of new baclofen use- greatly improved/resolved # Resp failure due to acute encephalopathy- resolved # Refractory Afib- on dilt drip, BB, and sotalol # CAD, afib, recent stent # Hyponatremia- chronic issue, about at baseline # FTT as OP, noted cognitive decline after stent- daughter notes he has not been as active, I wonder if his afib and  treatment are contributing  - Encourage PO - Up to chair - Eliquis, plavix - Cards consult for this stubborn afib, may need a different combo - No further ICU needs other than CCB drip, appreciate if [facility] can take over care starting tomorrow   Best practice:  Diet: cardiac Pain/Anxiety/Delirium protocol (if indicated): DC all VAP protocol (if indicated): DC DVT prophylaxis: NoAC today hopefully GI prophylaxis: PPI Glucose control: SSI Mobility: PT/OT consult, up to chair, will need SNF Code Status: Full Family Communication: updated daughter yesterday Disposition: ICU pending CCB drip wean  Erskine Emery MD [facility] 07/25/2019 8:48 AM Personal pager: [phone] If unanswered, please page CCM On-call: [phone]   ADDENDUM: Patient apparently refused meds yesterday likely explaining the Afib.  Still would appreciate cardiology input to make sure current regimen is appropriate.

## 2019-07-25 NOTE — Consult Note (Addendum)
°Cardiology Consultation:  ° °Patient ID: Duane [name] °MRN: [id number]; DOB: 09/30/1935 ° °Admit date: 07/23/2019 °Date of Consult: 07/25/2019 ° °Primary Care Provider: McKinney, John, MD °Primary Cardiologist: [facility] °Primary Electrophysiologist:  None  ° ° °Patient Profile:  ° °Duane [name] is a 84 y.o. male with a hx of CAD with multiple PCI last stent to the RCA in June 2020, CKD III, Paroxysmal Afib on eliquis, HTN, DM2, and Hyperlipidemia  who is being seen today for the evaluation of atrial fibrillation at the request of Dr. smith. ° °History of Present Illness:  °Patient follows with [facility] for above cardiac problems. Patient has h/o of CAD with reportedly 7 stents placed historically. Cath 4/13 showed  CTO OM and normal EF. Earlier this year he was experiencing anginal symptoms and underwent cath 05/11/19 and successful PCI with DES to RCA. Plavix was added. Patient has a longstanding h/o of paroxysmal afib. He had been well rate-controlled with metoprolol for many years and not had many recurrent symptoms. The patient had not been anticoagulated for h/o of unstable gait and syncopal episodes. One day after the last cath (6/26) the patient fell/had a syncopal episode and was brought back to the ED 6/28 and he was found to be in Afib. CT of the head was negative. He left after he converted to NSR. The patient went home and had another syncopal episode and was readmitted 05/14/19 and was found to be back in afib RVR with events of sustained VT. Cardiology was consulted and they discontinued ASA and metoprolol and started Eliquis and Sotolol 80 mg BId. Carotid studies in the past were normal. It was thought syncope was possible due to hypotension and VT which improved on sotolol.  Patient was seen back in the office 7/16 with anginal symptoms and was sent for emergent cath. It was found to be negative with no new lesions and patient was discharged. On 8/5 a ziopatch was placed for 2 weeks and results  showed very few runs of PAF, most of the time in NSR, and did not show any indication of pauses. On 9/4 ?Patient was brought to the [facility] ED for AMS which improved with IVF; work-up was otherwise normal. 2 days later he was seen in an urgent care and was given baclofen for UTI.  ° °Mr. Caples was brought to the ED 07/23/19 because he was found down in his independent living apartment. His family had been trying to call him and when he was not answering the phone they asked employees to check on him. He was found on the ground obtunded and not protecting his airway. He was brought to the ED. History was provided by daughter Duane [name] and his brother Duane [name]. They said that he has been mentally declining over the last year and is no longer able to function as before. Patient was noted to be plegic on the left side. Patient was intubated in the ED 2/2 to airway protection. Neurology was consulted and MRI of the brain was ordered and patient was placed on EEG for evaluation of seizures. Labs showed hyponatremia, creatinine 1.27, glucose 174, Lactic acid 2.5, WBC 14, Hgb 12.4. Patient was originally placed on abx but no source was found so they were discontinued . Patient was extubated 07/24/19. 07/25/19 he was found to be in afib RVR, rate 142, K+ 3.5, Mag  1.8, Creatinine 1.12 BP 144/86. Cardizem IV infusion was started and cardiology was consulted.  ° °On exam today patient is   is still confused and is not oriented time and place. He cannot remember why he is in the hospital. He denies chest pain, sob, lightheadedness, dizziness, or palpitations.   Heart Pathway Score:     Past Medical History:  Diagnosis Date   A-fib ([place])    Anemia    Angina pectoris (H[facility])    Arthritis    Carpal tunnel syndrome    Cobalamin deficiency    Coronary artery disease    Diabetes mellitus without complication (HCC)    Eczema    Hyperlipemia    Hypertension    Kidney disease, chronic, stage III (moderate, EGFR 30-59  ml/min) (HCC)    Lung infiltrate    Myocardial infarct, old    Prostatic hypertrophy    Sleep apnea, obstructive    Squamous cell cancer of lip     Past Surgical History:  Procedure Laterality Date   CORONARY ANGIOPLASTY WITH STENT PLACEMENT       Home Medications:  Prior to Admission medications   Medication Sig Start Date End Date Taking? Authorizing Provider  apixaban (ELIQUIS) 5 MG TABS tablet Take 5 mg by mouth 2 (two) times daily.   Yes [provider]  Ascorbic Acid (VITAMIN C PO) Take 1 tablet by mouth daily.   Yes [provider]  b complex vitamins tablet Take 1 tablet by mouth daily.   Yes [provider]  Calcium-Magnesium-Vitamin D (CALCIUM MAGNESIUM PO) Take 1 tablet by mouth daily.    Yes [provider]  calcium-vitamin D (OSCAL WITH D) 500-200 MG-UNIT tablet Take 1 tablet by mouth daily with breakfast.   Yes [provider]  clopidogrel (PLAVIX) 75 MG tablet Take 75 mg by mouth daily.   Yes [provider]  diclofenac sodium (VOLTAREN) 1 % GEL Apply 2 g topically daily as needed (For muscle pain).    Yes [provider]  EPINEPHrine (EPIPEN 2-PAK) 0.3 mg/0.3 mL IJ SOAJ injection Inject 0.3 mg into the muscle once as needed (severe allergic reaction).   Yes [provider]  losartan (COZAAR) 100 MG tablet Take 100 mg by mouth daily.   Yes [provider]  memantine (NAMENDA) 5 MG tablet Take 5 mg by mouth daily.   Yes [provider]  metFORMIN (GLUCOPHAGE) 1000 MG tablet Take 1,000 mg by mouth 2 (two) times daily with a meal. 07/05/16  Yes [provider]  metoprolol succinate (TOPROL-XL) 25 MG 24 hr tablet Take 25 mg by mouth 2 (two) times daily.    Yes [provider]  nitroGLYCERIN (NITROSTAT) 0.4 MG SL tablet Place 0.4 mg under the tongue every 5 (five) minutes as needed for chest pain.   Yes [provider]  Omega-3-6-9 CAPS Take 2 capsules by  mouth 2 (two) times daily.    Yes [provider]  OVER THE COUNTER MEDICATION Apply 1 application topically 2 (two) times daily as needed (pain). CBD Oil   Yes [provider]  Probiotic Product (PROBIOTIC PO) Take 1 tablet by mouth daily.   Yes [provider]  saw palmetto 160 MG capsule Take 160 mg by mouth 2 (two) times daily.   Yes [provider]  sitaGLIPtin (JANUVIA) 25 MG tablet Take 25 mg by mouth every evening.   Yes [provider]  sotalol (BETAPACE) 80 MG tablet Take 80 mg by mouth 2 (two) times daily.   Yes [provider]    Inpatient Medications: Scheduled Meds:  apixaban  5 mg  BID  °• aspirin  81 mg Oral Daily  °• Chlorhexidine Gluconate Cloth  6 each Topical Daily  °• clopidogrel  75 mg Oral Daily  °• insulin aspart  0-15 Units Subcutaneous Q4H  °• mouth rinse  15 mL Mouth Rinse BID  °• memantine  5 mg Oral Daily  °• metoprolol tartrate  50 mg Oral BID  °• pantoprazole  40 mg Oral Daily  °• ramelteon  8 mg Oral QHS  °• sotalol  80 mg Oral Q12H  ° °Continuous Infusions: °• diltiazem (CARDIZEM) infusion Stopped (07/25/19 1038)  ° °PRN Meds: °acetaminophen, hydrALAZINE, metoprolol tartrate ° °Allergies:    °Allergies  °Allergen Reactions  °• Baclofen Other (See Comments)  °  Severe AMS, comatose, intubated  °• Bee Venom Anaphylaxis  °• Contrast Media [Iodinated Diagnostic Agents] Anaphylaxis  °• Iodine-131 Anaphylaxis  °• Metrizamide Anaphylaxis  °• Metronidazole Anaphylaxis  °• Atorvastatin Other (See Comments)  °  Aching in legs  °• Lisinopril Cough  ° ° °Social History:   °Social History  ° °Socioeconomic History  °• Marital status: Married  °  Spouse name: Not on file  °• Number of children: Not on file  °• Years of education: Not on file  °• Highest education level: Not on file  °Occupational History  °• Not on file  °Social Needs  °• Financial resource strain: Not on file  °• Food insecurity  °  Worry: Not on file  °   Inability: Not on file  °• Transportation needs  °  Medical: Not on file  °  Non-medical: Not on file  °Tobacco Use  °• Smoking status: Never Smoker  °• Smokeless tobacco: Never Used  °Substance and Sexual Activity  °• Alcohol use: No  °• Drug use: Not on file  °• Sexual activity: Not on file  °Lifestyle  °• Physical activity  °  Days per week: Not on file  °  Minutes per session: Not on file  °• Stress: Not on file  °Relationships  °• Social connections  °  Talks on phone: Not on file  °  Gets together: Not on file  °  Attends religious service: Not on file  °  Active member of club or organization: Not on file  °  Attends meetings of clubs or organizations: Not on file  °  Relationship status: Not on file  °• Intimate partner violence  °  Fear of current or ex partner: Not on file  °  Emotionally abused: Not on file  °  Physically abused: Not on file  °  Forced sexual activity: Not on file  °Other Topics Concern  °• Not on file  °Social History Narrative  °• Not on file  °  °Family History:   °Family History  °Problem Relation Age of Onset  °• Hypertension Other   °  ° °ROS:  °Please see the history of present illness.  °All other ROS reviewed and negative.    ° °Physical Exam/Data:  ° °Vitals:  ° 07/25/19 1115 07/25/19 1130 07/25/19 1142 07/25/19 1145  °BP: 116/68 118/87  118/82  °Pulse: 93 92  90  °Resp: 17 19  15  °Temp:   97.7 °F (36.5 °C)   °TempSrc:   Oral   °SpO2: 96% 98%  98%  °Weight:      °Height:      ° ° °Intake/Output Summary (Last 24 hours) at 07/25/2019 1151 °Last data filed at 07/25/2019 1100 °Gross per 24 hour  °  Intake 80.56 ml  °Output 1275 ml  °Net -1194.44 ml  ° °Last 3 Weights 07/25/2019 07/24/2019 07/23/2019  °Weight (lbs) 202 lb 9.6 oz 205 lb 4 oz 201 lb 1 oz  °Weight (kg) 91.9 kg 93.1 kg 91.2 kg  °   °Body mass index is 28.26 kg/m².  °General:  Well nourished, well developed, in no acute distress; moderately confused °HEENT: normal °Lymph: no adenopathy °Neck: no JVD °Endocrine:  No  thryomegaly °Vascular: No carotid bruits; FA pulses 2+ bilaterally without bruits  °Cardiac:  normal S1, S2; RRR; no murmur  °Lungs:  clear to auscultation bilaterally, no wheezing, rhonchi or rales  °Abd: soft, nontender, no hepatomegaly  °Ext: no edema °Musculoskeletal:  No deformities, BUE and BLE strength normal and equal °Skin: warm and dry  °Neuro:  CNs 2-12 intact; oriented to person °Psych:  Normal affect  ° °EKG:  The EKG was personally reviewed and demonstrates:  Afib with RVr, 136 bpm, LVH °Telemetry:  Telemetry was personally reviewed and demonstrates:  Afib RVR this morning around 4 AM rates to the 140s. Rates slowly slowed over the day with medicines. Around 13:00 patient converted to NSR, rates in the 70s.  ° °Relevant CV Studies: ° °Ziopatch 06/20/2019 °Recent event monitoring device shows a very few runs of sure paroxysmal atrial fibrillation. Most of the time in normal sinus rhythm °  °Cardiac Cath 06/01/19 [facility] °FINDINGS: °No significant stenosis in any of the coronary arteries °Patent stents to LAD °Patent stents to Lcx °Patent stents to RCA °Diagnostic Procedure Recommendations °Continue medical management °Continue DAPT ° °Echo 05/24/19 [facility] °Conclusions °Summary °There is no prior echocardiogram for comparison. °Tricuspid valve is structurally normal. °Trace tricuspid regurgitation. °Ejection fraction is visually estimated at 55-60%. °Normal left ventricular size and systolic function with no appreciable °segmental abnormality. °Diastolic function appears normal. °Mild concentric left ventricular hypertrophy. °Normal right ventricular size and function. °RVSP cannot be estimated. ° °Laboratory Data: ° °High Sensitivity Troponin:   °Recent Labs  °Lab 07/23/19 °1447  °TROPONINIHS 17  °   °Chemistry °Recent Labs  °Lab 07/23/19 °1840 07/24/19 °0438 07/25/19 °0519  °NA 129* 129* 128*  °K 4.2 3.3* 3.5  °CL 93* 94* 94*  °CO2 25 23 21*  °GLUCOSE 174* 122* 160*  °BUN 21 16 10  °CREATININE  1.27* 1.04 1.12  °CALCIUM 9.1 8.7* 8.6*  °GFRNONAA 52* >60 >60  °GFRAA >60 >60 >60  °ANIONGAP 11 12 13  °  °Recent Labs  °Lab 07/23/19 °1207  °PROT 7.5  °ALBUMIN 4.1  °AST 50*  °ALT 22  °ALKPHOS 62  °BILITOT 1.2  ° °Hematology °Recent Labs  °Lab 07/23/19 °1207  07/24/19 °0438 07/24/19 °1144 07/25/19 °0316  °WBC 11.6*  --  14.0*  --  10.8*  °RBC 4.68  --  4.38  --  3.94*  °HGB 13.2   < > 12.4* 11.9* 11.0*  °HCT 38.8*   < > 35.7* 35.0* 32.9*  °MCV 82.9  --  81.5  --  83.5  °MCH 28.2  --  28.3  --  27.9  °MCHC 34.0  --  34.7  --  33.4  °RDW 14.0  --  14.1  --  14.6  °PLT 235  --  190  --  204  ° < > = values in this interval not displayed.  ° °BNPNo results for input(s): BNP, PROBNP in the last 168 hours.  °DDimer No results for input(s): DDIMER in the last 168 hours. ° ° °Radiology/Studies:  °  Ct Head Wo Contrast ° °Result Date: 07/23/2019 °CLINICAL DATA:  Patient found down today.  Altered mental status. EXAM: CT HEAD WITHOUT CONTRAST TECHNIQUE: Contiguous axial images were obtained from the base of the skull through the vertex without intravenous contrast. COMPARISON:  Head CT scan and brain MRI 07/19/2019. FINDINGS: Brain: No evidence of acute infarction, hemorrhage, hydrocephalus, extra-axial collection or mass lesion/mass effect. Chronic microvascular ischemic change is again seen. Vascular: No hyperdense vessel or unexpected calcification. Skull: Intact.  No focal lesion. Sinuses/Orbits: Mild ethmoid air cell disease and minimal mucosal thickening in the frontal sinuses noted. Other: None. IMPRESSION: No acute abnormality. Chronic microvascular ischemic change. Electronically Signed   By: Duane  [name] M.D.   On: 07/23/2019 13:00  ° °Ct Cervical Spine Wo Contrast ° °Result Date: 07/23/2019 °CLINICAL DATA:  Patient found down this morning. Altered mental status. Initial encounter. EXAM: CT CERVICAL SPINE WITHOUT CONTRAST TECHNIQUE: Multidetector CT imaging of the cervical spine was performed without intravenous  contrast. Multiplanar CT image reconstructions were also generated. COMPARISON:  None. FINDINGS: Alignment: Maintained. Skull base and vertebrae: No acute fracture. No primary bone lesion or focal pathologic process. Soft tissues and spinal canal: Negative. Disc levels: Marked loss of disc space height is seen at C5-6. The C6-7 level is fused. Upper chest: Lung apices clear. Other: None. IMPRESSION: No acute abnormality. Degenerative disc disease C5-6. Electronically Signed   By: Duane  [name] M.D.   On: 07/23/2019 13:02  ° °Mr Angio Head Wo Contrast ° °Result Date: 07/23/2019 °CLINICAL DATA:  Altered level of consciousness (LOC), unexplained Unresponsive, weakness in left arm and left leg compared to right.; Stroke, follow up EXAM: MRI HEAD WITHOUT CONTRAST MRA HEAD WITHOUT CONTRAST MRA NECK WITHOUT CONTRAST TECHNIQUE: Multiplanar, multiecho pulse sequences of the brain and surrounding structures were obtained without intravenous contrast. Angiographic images of the Circle of Willis were obtained using MRA technique without intravenous contrast. Angiographic images of the neck were obtained using MRA technique without intravenous contrast. Carotid stenosis measurements (when applicable) are obtained utilizing NASCET criteria, using the distal internal carotid diameter as the denominator. COMPARISON:  CT head without contrast 07/23/2019 in 07/19/2019. MR head without contrast 07/19/2019. Studies performed 07/19/2019 were done at [facility] FINDINGS: MRI HEAD FINDINGS Brain: No acute infarct, hemorrhage, or mass lesion is present. Mild atrophy and moderate diffuse white matter disease is present bilaterally. There is volume loss with ex vacuo dilation of the lateral ventricles. No significant extraaxial fluid collection is present. Brainstem and cerebellum are within normal limits. Vascular: Flow is present in the major intracranial arteries. Skull and upper cervical spine: The  craniocervical junction is normal. Upper cervical spine is within normal limits. Marrow signal is unremarkable. Sinuses/Orbits: Scattered ethmoid opacification is similar the prior study. Mild mucosal thickening is present in the frontal sinuses bilaterally. The paranasal sinuses and mastoid air cells are otherwise clear. Bilateral lens replacements are noted. Globes and orbits are otherwise unremarkable. MRA HEAD FINDINGS The internal carotid arteries are within normal limits from the high cervical segments through the ICA termini bilaterally. The A1 and M1 segments are normal. The anterior communicating artery is present. ACA and MCA branch vessels are within normal limits. MRA NECK FINDINGS Time-of-flight images demonstrate normal flow in the carotid arteries bilaterally. There is no significant flow disturbance at either carotid bifurcation. Flow is antegrade in the vertebral arteries bilaterally. The left vertebral artery is dominant. IMPRESSION: 1. No acute intracranial abnormality. 2. Stable diffuse white matter disease, moderately   advanced for age. 3. Normal MRA circle-of-Willis without significant proximal stenosis, aneurysm, or branch vessel occlusion. 4. Normal MRA of the neck without contrast. No significant stenosis. Electronically Signed   By: Duane  [name] M.D.   On: 07/23/2019 17:31  ° °Mr Angio Neck Wo Contrast ° °Result Date: 07/23/2019 °CLINICAL DATA:  Altered level of consciousness (LOC), unexplained Unresponsive, weakness in left arm and left leg compared to right.; Stroke, follow up EXAM: MRI HEAD WITHOUT CONTRAST MRA HEAD WITHOUT CONTRAST MRA NECK WITHOUT CONTRAST TECHNIQUE: Multiplanar, multiecho pulse sequences of the brain and surrounding structures were obtained without intravenous contrast. Angiographic images of the Circle of Willis were obtained using MRA technique without intravenous contrast. Angiographic images of the neck were obtained using MRA technique without intravenous  contrast. Carotid stenosis measurements (when applicable) are obtained utilizing NASCET criteria, using the distal internal carotid diameter as the denominator. COMPARISON:  CT head without contrast 07/23/2019 in 07/19/2019. MR head without contrast 07/19/2019. Studies performed 07/19/2019 were done at [facility] FINDINGS: MRI HEAD FINDINGS Brain: No acute infarct, hemorrhage, or mass lesion is present. Mild atrophy and moderate diffuse white matter disease is present bilaterally. There is volume loss with ex vacuo dilation of the lateral ventricles. No significant extraaxial fluid collection is present. Brainstem and cerebellum are within normal limits. Vascular: Flow is present in the major intracranial arteries. Skull and upper cervical spine: The craniocervical junction is normal. Upper cervical spine is within normal limits. Marrow signal is unremarkable. Sinuses/Orbits: Scattered ethmoid opacification is similar the prior study. Mild mucosal thickening is present in the frontal sinuses bilaterally. The paranasal sinuses and mastoid air cells are otherwise clear. Bilateral lens replacements are noted. Globes and orbits are otherwise unremarkable. MRA HEAD FINDINGS The internal carotid arteries are within normal limits from the high cervical segments through the ICA termini bilaterally. The A1 and M1 segments are normal. The anterior communicating artery is present. ACA and MCA branch vessels are within normal limits. MRA NECK FINDINGS Time-of-flight images demonstrate normal flow in the carotid arteries bilaterally. There is no significant flow disturbance at either carotid bifurcation. Flow is antegrade in the vertebral arteries bilaterally. The left vertebral artery is dominant. IMPRESSION: 1. No acute intracranial abnormality. 2. Stable diffuse white matter disease, moderately advanced for age. 3. Normal MRA circle-of-Willis without significant proximal stenosis, aneurysm, or  branch vessel occlusion. 4. Normal MRA of the neck without contrast. No significant stenosis. Electronically Signed   By: Duane  [name] M.D.   On: 07/23/2019 17:31  ° °Mr Brain Wo Contrast ° °Result Date: 07/23/2019 °CLINICAL DATA:  Altered level of consciousness (LOC), unexplained Unresponsive, weakness in left arm and left leg compared to right.; Stroke, follow up EXAM: MRI HEAD WITHOUT CONTRAST MRA HEAD WITHOUT CONTRAST MRA NECK WITHOUT CONTRAST TECHNIQUE: Multiplanar, multiecho pulse sequences of the brain and surrounding structures were obtained without intravenous contrast. Angiographic images of the Circle of Willis were obtained using MRA technique without intravenous contrast. Angiographic images of the neck were obtained using MRA technique without intravenous contrast. Carotid stenosis measurements (when applicable) are obtained utilizing NASCET criteria, using the distal internal carotid diameter as the denominator. COMPARISON:  CT head without contrast 07/23/2019 in 07/19/2019. MR head without contrast 07/19/2019. Studies performed 07/19/2019 were done at [facility] FINDINGS: MRI HEAD FINDINGS Brain: No acute infarct, hemorrhage, or mass lesion is present. Mild atrophy and moderate diffuse white matter disease is present bilaterally. There is volume loss with ex   vacuo dilation of the lateral ventricles. No significant extraaxial fluid collection is present. Brainstem and cerebellum are within normal limits. Vascular: Flow is present in the major intracranial arteries. Skull and upper cervical spine: The craniocervical junction is normal. Upper cervical spine is within normal limits. Marrow signal is unremarkable. Sinuses/Orbits: Scattered ethmoid opacification is similar the prior study. Mild mucosal thickening is present in the frontal sinuses bilaterally. The paranasal sinuses and mastoid air cells are otherwise clear. Bilateral lens replacements are noted. Globes and  orbits are otherwise unremarkable. MRA HEAD FINDINGS The internal carotid arteries are within normal limits from the high cervical segments through the ICA termini bilaterally. The A1 and M1 segments are normal. The anterior communicating artery is present. ACA and MCA branch vessels are within normal limits. MRA NECK FINDINGS Time-of-flight images demonstrate normal flow in the carotid arteries bilaterally. There is no significant flow disturbance at either carotid bifurcation. Flow is antegrade in the vertebral arteries bilaterally. The left vertebral artery is dominant. IMPRESSION: 1. No acute intracranial abnormality. 2. Stable diffuse white matter disease, moderately advanced for age. 3. Normal MRA circle-of-Willis without significant proximal stenosis, aneurysm, or branch vessel occlusion. 4. Normal MRA of the neck without contrast. No significant stenosis. Electronically Signed   By: Duane  [name] M.D.   On: 07/23/2019 17:31  ° °Dg Chest Portable 1 View ° °Result Date: 07/23/2019 °CLINICAL DATA:  Post intubation Second image to confirm OG tube placement EXAM: PORTABLE CHEST 1 VIEW COMPARISON:  Chest radiograph 07/23/2019 at 12:53 p.m. FINDINGS: Status post placement of an endotracheal tube with tip terminating between the thoracic inlet and carina. Interval placement of a nasogastric tube with side port projecting the at or just beyond the GE junction. Stable cardiomediastinal contours. Low lung volumes without new focal infiltrate. No pneumothorax or large pleural effusion. Nonobstructive bowel gas pattern. IMPRESSION: 1. Satisfactory placement of the endotracheal tube terminating between the thoracic inlet and carina. 2. Interval placement of nasogastric tube with side port projecting at or just beyond the GE junction. Recommend short advancement into the stomach. Electronically Signed   By: Duane  [name] M.D.   On: 07/23/2019 14:20  ° °Dg Chest Portable 1 View ° °Result Date: 07/23/2019 °CLINICAL  DATA:  Altered Mental Status, ? Stroke, Sleep Apnea. Tech wore mask, face shield, and gloves. Patient wore a mask. EXAM: PORTABLE CHEST 1 VIEW COMPARISON:  Chest radiograph 07/19/2019, 05/31/2019 FINDINGS: Stable cardiomediastinal contours. Minimal opacity at the left base likely atelectasis or scarring. No focal infiltrate. No pneumothorax or large pleural effusion. No acute finding in the visualized skeleton. IMPRESSION: Minimal left basilar atelectasis or scarring. No other acute finding. Electronically Signed   By: Duane  [name] M.D.   On: 07/23/2019 13:18  ° ° °Assessment and Plan:  ° °Toxic metabolic encephalopathy/Memory Impairment @ baseline °Patient was found down in his independent living apartment obtunded and not protecting airway. Intubated 07/23/19 in the ED. Patient was started on baclofen for possible UTI 2 days prior. His family helped with h/o due to chronic memory impairment. BG  Has been normal. Ct head was negative for acute abnormalities. Patient was extubated 07/24/19 °- Neurology following patient >>No imaging or electrographic evidence of stroke or seizure to explain the changes °- No source of infection was found >> abx were d/c'd °- Found to be in afib RVR this AM. Patient on a/c Eliquis and sotolol 80 mg BID. Patient is now back in NSR. Continue current regimen °- Patient is still confused on exam. Difficult   to assess mental status due to recent decline in memory and unsure of baseline. - Blood work is unremarkable - Further work-up per IM  Paroxysmal Afib - On eliquis and sotolol 80 mg BID at baseline - Patient has had recent events of Afib with admissions that were suspected to be related to syncope. He had a ziopatch in August 2020 recording short events of afib, but mostly in NSR. No pauses were noted. - Patient was brought to the ED 9/7 after being found on the floor at his living facility. He was intubated 9/7 and was recently extubated 9/8 and restarted taking medications as  prescribed.  - This AM he was found to be in afiv RVR rates to the 140s and put put on IV dilt - K+ and mag were repleated - He denies any symptoms - On telemetry patient appears to have converted to NSR around 1:00 PM. Currently maintaining NSR rates in the 70s - Continue current regimen  CKD 3  - Creatinine 1.27 > 1.04 > 1.12 - Baseline around 1.2  CAD s/p multiple PCIs, most recent DES to RCA in 04/2019 at [facility] - No recent episodes of chest pain - Continue ASA/Plavix, BB - No concern for ACS  HTN - Stable - Losartan on hold - continue BB  Hyperlipidemia  - On Pravastatin 40 mg daily  DM2 - per IM   For questions or updates, please contact [facility] Please consult [email or web address] for contact info under     Signed, Duane Ninfa Meeker, PA-C  07/25/2019 11:51 AM   Patient examined chart reviewed Discussed care with PA and nurse. Exam with elderly white male. Some confusion basilar atelectasis no murmur no edema. He had not had his sotolol while intubated. Taking PO and has had AAT this am with conversion to NSR. QT ok. Continue sotolol 80 bid. He was on ASA/Plavix and eliquis for his PAF and CAD per cardiologist at [place] age and MS changes with fall risk would consider stopping 81 mg ASA and Rx with just plavix and eliquis  Jenkins Rouge

## 2019-07-25 NOTE — Evaluation (Addendum)
Occupational Therapy Evaluation Patient Details Name: Duane [name] MRN: [id number] DOB: 09/21/1935 Today's Date: 07/25/2019    History of Present Illness patient is an 84 year old male admitted after being found down by staff at senior independent living home. Patient admitted with AMS, encephalopathy, intubated to protect his airway. Extubated now. Questionable left sided weakness, testing negative for CVA.   Clinical Impression   This 84 y/o male presents with the above. PTA pt reports he is independent with ADL, iADL and functional mobility. Pt presenting with pain, weakness, impaired cognition and balance deficits. Pt requiring maxA for bed mobility during session, with difficulty maintaining static sitting balance EOB requiring overall modA, often with R lateral lean. Pt currently requires minA for seated UB ADL, up to [place] for LB ADL. Pt easily distracted and with impaired judgment/safety at this time requiring cues during session to address. He will benefit from continued acute OT services and recommend follow up therapy services in SNF setting after discharge to maximize his safety and independence with ADL and mobility. Will follow.     Follow Up Recommendations  SNF;Supervision/Assistance - 24 hour    Equipment Recommendations  Other (comment)(TBD in next venue)           Precautions / Restrictions Precautions Precautions: Fall Restrictions Weight Bearing Restrictions: No      Mobility Bed Mobility Overal bed mobility: Needs Assistance Bed Mobility: Supine to Sit;Sit to Supine     Supine to sit: Mod assist;HOB elevated Sit to supine: Max assist   General bed mobility comments: pt able to initiate LEs over EOB with assist for trunk elevation and cues for UE placement to self assist, requires assist for LEs/trunk when returning to supine; bed egressed to modified chair position end of session  Transfers                      Balance Overall balance  assessment: Needs assistance Sitting-balance support: Feet supported Sitting balance-Leahy Scale: Poor Sitting balance - Comments: pt tending to list R in sitting requiring assist to stabilize (overall modA to maintain balance) Postural control: Right lateral lean                                 ADL either performed or assessed with clinical judgement   ADL Overall ADL's : Needs assistance/impaired Eating/Feeding: Set up;Sitting   Grooming: Set up;Min guard;Bed level;Wash/dry face   Upper Body Bathing: Minimal assistance;Sitting Upper Body Bathing Details (indicate cue type and reason): in supported sitting, requires increased assist for unsupported Lower Body Bathing: Maximal assistance;Bed level;Sitting/lateral leans   Upper Body Dressing : Minimal assistance;Sitting   Lower Body Dressing: Maximal assistance;Total assistance;Sitting/lateral leans;Bed level Lower Body Dressing Details (indicate cue type and reason): totalA to don socks at bed level             Functional mobility during ADLs: Maximal assistance(bed mobility) General ADL Comments: pt tolerate bed mobility and to sit EOB, poor sitting balance so unable to progress beyond EOB; pt with weakness, impaired balance, cognitive impairements     Vision         Perception     Praxis      Pertinent Vitals/Pain Pain Assessment: No/denies pain Faces Pain Scale: Hurts even more Pain Location: "all over", more notable on R hip/LE/lower back Pain Descriptors / Indicators: Discomfort;Grimacing;Sore Pain Intervention(s): Limited activity within patient's tolerance;Monitored during session;Repositioned  Hand Dominance Right   Extremity/Trunk Assessment Upper Extremity Assessment Upper Extremity Assessment: Generalized weakness;LUE deficits/detail LUE Deficits / Details: LUE weakness more notable than RUE, grossly 3/5 LUE Coordination: decreased gross motor   Lower Extremity Assessment Lower  Extremity Assessment: Defer to PT evaluation       Communication Communication Communication: No difficulties   Cognition Arousal/Alertness: Awake/alert Behavior During Therapy: WFL for tasks assessed/performed Overall Cognitive Status: Impaired/Different from baseline Area of Impairment: Attention;Orientation;Memory;Following commands;Safety/judgement;Awareness;Problem solving                 Orientation Level: Disoriented to;Time;Situation Current Attention Level: Focused Memory: Decreased short-term memory Following Commands: Follows one step commands with increased time Safety/Judgement: Decreased awareness of safety;Decreased awareness of deficits Awareness: Intellectual Problem Solving: Slow processing;Decreased initiation;Requires verbal cues;Requires tactile cues General Comments: pt not able to state why he is in the hospital, when asked the year pt responds "why would someone my age need to know the date", reports he uses his watch for the date, was able to tell me the current president. pt also with decreased insight into current deficits, required assist to keep his balance sitting EOB but still stating he thinks he could stand. pt easily distracted, somewhat zoning out during tasks requiring cues to redirect and bring focus back to task at hand    General Comments  max HR noted up to 120 with activity    Exercises     Shoulder Instructions      Home Living Family/patient expects to be discharged to:: Private residence Living Arrangements: Alone Available Help at Discharge: Family;Friend(s) Type of Home: Apartment Home Access: Level entry     Home Layout: One level     Bathroom Shower/Tub: Teacher, early years/pre: [place]: Shower seat;Grab bars - tub/shower;Grab bars - toilet   Additional Comments: Lives in senior independent living.      Prior Functioning/Environment Level of Independence: Independent         Comments: per PT eval,daughter reports he lives alone, was not using a device, however was declining for the last 2 months. States he has not been feeling well therefore not moving as much and not eating well.        OT Problem List: Decreased strength;Decreased range of motion;Decreased activity tolerance;Impaired balance (sitting and/or standing);Decreased cognition;Decreased safety awareness;Decreased knowledge of use of DME or AE;Pain;Decreased knowledge of precautions      OT Treatment/Interventions: Self-care/ADL training;Therapeutic exercise;Energy conservation;DME and/or AE instruction;Therapeutic activities;Cognitive remediation/compensation;Patient/family education;Balance training    OT Goals(Current goals can be found in the care plan section) Acute Rehab OT Goals Patient Stated Goal: regain his independence OT Goal Formulation: With patient Time For Goal Achievement: 08/08/19 Potential to Achieve Goals: Good  OT Frequency: Min 2X/week   Barriers to D/C:            Co-evaluation              AM-PAC OT "6 Clicks" Daily Activity     Outcome Measure Help from another person eating meals?: A Little Help from another person taking care of personal grooming?: A Little Help from another person toileting, which includes using toliet, bedpan, or urinal?: Total Help from another person bathing (including washing, rinsing, drying)?: A Lot Help from another person to put on and taking off regular upper body clothing?: A Little Help from another person to put on and taking off regular lower body clothing?: Total 6 Click Score: 13   End  of Session Equipment Utilized During Treatment: Oxygen Nurse Communication: Mobility status  Activity Tolerance: Patient tolerated treatment well Patient left: in bed;with call bell/phone within reach;with bed alarm set  OT Visit Diagnosis: Muscle weakness (generalized) (M62.81);Other abnormalities of gait and mobility (R26.89)                 Time: [id number] OT Time Calculation (min): 42 min Charges:  OT General Charges $OT Visit: 1 Visit OT Evaluation $OT Eval Moderate Complexity: 1 Mod OT Treatments $Self Care/Home Management : 23-37 mins  Lou Cal, OT [facility] Pager [phone] Office [phone]   Raymondo Band 07/25/2019, 2:24 PM

## 2019-07-26 DIAGNOSIS — I480 Paroxysmal atrial fibrillation: Secondary | ICD-10-CM

## 2019-07-26 LAB — BASIC METABOLIC PANEL
Anion gap: 11 (ref 5–15)
BUN: 11 mg/dL (ref 8–23)
CO2: 24 mmol/L (ref 22–32)
Calcium: 8.4 mg/dL — ABNORMAL LOW (ref 8.9–10.3)
Chloride: 93 mmol/L — ABNORMAL LOW (ref 98–111)
Creatinine, Ser: 1.09 mg/dL (ref 0.61–1.24)
GFR calc Af Amer: >60 mL/min (ref >60)
GFR calc non Af Amer: >60 mL/min (ref >60)
Glucose, Bld: 149 mg/dL — ABNORMAL HIGH (ref 70–99)
Potassium: 3.8 mmol/L (ref 3.5–5.1)
Sodium: 128 mmol/L — ABNORMAL LOW (ref 135–145)

## 2019-07-26 LAB — CBC
HCT: 32.9 % — ABNORMAL LOW (ref 39.0–52.0)
Hemoglobin: 11.1 g/dL — ABNORMAL LOW (ref 13.0–17.0)
MCH: 28.3 pg (ref 26.0–34.0)
MCHC: 33.7 g/dL (ref 30.0–36.0)
MCV: 83.9 fL (ref 80.0–100.0)
Platelets: 217 10*3/uL (ref 150–400)
RBC: 3.92 MIL/uL — ABNORMAL LOW (ref 4.22–5.81)
RDW: 14.3 % (ref 11.5–15.5)
WBC: 9.2 10*3/uL (ref 4.0–10.5)
nRBC: 0 % (ref 0.0–0.2)

## 2019-07-26 LAB — GLUCOSE, CAPILLARY
Glucose-Capillary: 132 mg/dL — ABNORMAL HIGH (ref 70–99)
Glucose-Capillary: 137 mg/dL — ABNORMAL HIGH (ref 70–99)
Glucose-Capillary: 152 mg/dL — ABNORMAL HIGH (ref 70–99)
Glucose-Capillary: 174 mg/dL — ABNORMAL HIGH (ref 70–99)
Glucose-Capillary: 189 mg/dL — ABNORMAL HIGH (ref 70–99)
Glucose-Capillary: 229 mg/dL — ABNORMAL HIGH (ref 70–99)
Glucose-Capillary: 93 mg/dL (ref 70–99)

## 2019-07-26 MED ORDER — SENNA 8.6 MG PO TABS
2.0000 | ORAL_TABLET | Freq: Every day | ORAL | Status: DC
  Administered 2019-07-26 – 2019-07-27 (×2): 17.2 mg via ORAL
  Filled 2019-07-26 (×2): qty 2

## 2019-07-26 MED ORDER — POLYETHYLENE GLYCOL 3350 17 G PO PACK
17.0000 g | PACK | Freq: Every day | ORAL | Status: DC
  Administered 2019-07-27: 10:00:00 17 g via ORAL
  Filled 2019-07-26: qty 1

## 2019-07-26 NOTE — NC FL2 (Signed)
[place] LEVEL OF CARE SCREENING TOOL     IDENTIFICATION  Patient Name: Duane [name] Birthdate: 01-17-1935 Sex: male Admission Date (Current Location): 07/23/2019  [facility] and [place] Number:  Herbalist and Address:  [facility], [place] [address] [place], [place] [address]      Provider Number: [id number]  Attending Physician Name and Address:  Shelly Coss, MD  Relative Name and Phone Number:  Chip Victorian    Current Level of Care: Hospital Recommended Level of Care: [place] Prior Approval Number:    Date Approved/Denied:   PASRR Number: [id number]  Discharge Plan: SNF    Current Diagnoses: Patient Active Problem List   Diagnosis Date Noted  . Encephalopathy 07/23/2019  . Respiratory failure with hypoxia ([place]) 09/08/2016  . Abdominal pain, right upper quadrant 09/08/2016  . Essential hypertension 09/08/2016  . PAF (paroxysmal atrial fibrillation) ([place]) 09/08/2016  . Diabetes mellitus type 2, controlled ([place]) 09/08/2016  . ARF (acute renal failure) ([place]) 09/08/2016  . Normochromic normocytic anemia 09/08/2016  . Abdominal pain 09/08/2016    Orientation RESPIRATION BLADDER Height & Weight     Self, Time  Normal Incontinent Weight: 90.5 kg Height:  5\' 11"  (180.3 cm)  BEHAVIORAL SYMPTOMS/MOOD NEUROLOGICAL BOWEL NUTRITION STATUS      Continent Diet  AMBULATORY STATUS COMMUNICATION OF NEEDS Skin   Limited Assist Verbally                         Personal Care Assistance Level of Assistance  Bathing, Feeding, Dressing Bathing Assistance: Limited assistance Feeding assistance: Limited assistance Dressing Assistance: Limited assistance     Functional Limitations Info  Sight, Hearing, Speech Sight Info: Adequate Hearing Info: Adequate Speech Info: Adequate    SPECIAL CARE FACTORS FREQUENCY  PT (By licensed PT), OT (By licensed OT), Blood pressure     PT Frequency: PT to eval and  treat a minimum of 5 x week OT Frequency: OT to eval and treat a minimum of 5 x week            Contractures Contractures Info: Not present    Additional Factors Info                  Current Medications (07/26/2019):  This is the current hospital active medication list Current Facility-Administered Medications  Medication Dose Route Frequency Provider Last Rate Last Dose  . acetaminophen (TYLENOL) tablet 650 mg  650 mg Oral Q4H PRN Julian Hy, DO   650 mg at 07/26/19 1210  . apixaban (ELIQUIS) tablet 5 mg  5 mg Oral BID Candee Furbish, MD   5 mg at 07/26/19 0919  . Chlorhexidine Gluconate Cloth 2 % PADS 6 each  6 each Topical Daily Julian Hy, DO   6 each at 07/26/19 [id number]  . clopidogrel (PLAVIX) tablet 75 mg  75 mg Oral Daily Noemi Chapel P, DO   75 mg at 07/26/19 [id number]  . hydrALAZINE (APRESOLINE) injection 10 mg  10 mg Intravenous Q4H PRN Anders Simmonds, MD   10 mg at 07/24/19 0503  . insulin aspart (novoLOG) injection 0-15 Units  0-15 Units Subcutaneous Q4H Noemi Chapel P, DO   3 Units at 07/26/19 1145  . MEDLINE mouth rinse  15 mL Mouth Rinse BID Candee Furbish, MD   15 mL at 07/26/19 [id number]  . memantine (NAMENDA) tablet 5 mg  5 mg Oral Daily Tamala Julian,  Darnelle Maffucci, MD   5 mg at 07/26/19 [id number]  . metoprolol tartrate (LOPRESSOR) injection 2.5 mg  2.5 mg Intravenous Q3H PRN Anders Simmonds, MD   2.5 mg at 07/25/19 0450  . metoprolol tartrate (LOPRESSOR) tablet 50 mg  50 mg Oral BID Candee Furbish, MD   50 mg at 07/26/19 0919  . pantoprazole (PROTONIX) EC tablet 40 mg  40 mg Oral Daily Candee Furbish, MD   40 mg at 07/26/19 0919  . polyethylene glycol (MIRALAX / GLYCOLAX) packet 17 g  17 g Oral Daily Adhikari, Amrit, MD      . ramelteon (ROZEREM) tablet 8 mg  8 mg Oral QHS Candee Furbish, MD   8 mg at 07/25/19 1934  . senna (SENOKOT) tablet 17.2 mg  2 tablet Oral Daily Adhikari, Amrit, MD      . sotalol (BETAPACE) tablet 80 mg  80 mg Oral Q12H Candee Furbish, MD   80 mg at  07/26/19 [id number]     Discharge Medications: Please see discharge summary for a list of discharge medications.  Relevant Imaging Results:  Relevant Lab Results:   Additional Information SSN [id number]  Bartholomew Crews, RN

## 2019-07-26 NOTE — Progress Notes (Signed)
PROGRESS NOTE    Duane [name]  [id number] DOB: 07-06-1935 DOA: 07/23/2019 PCP: [name], MD   Brief Narrative:  Patient is 84 year old male with a past medical history of A. fib on Eliquis, OSA, coronary artery disease status post stenting, diabetes mellitus, CKD stage III who was found on the floor at his independent living apartment.  He was recently started on baclofen for low back pain at urgent care.  He has history of chronic memory deficits and is on Aricept but no previous diagnosis of dementia.  He was found to be obtunded and not protecting his airway and he had to be intubated.  He has been successfully extubated and currently he is alert and oriented at his baseline.  Physical therapy evaluated him and recommended skilled nursing facility on discharge.  Transferred to [facility] service on 07/26/2019. Anticipate discharge tomorrow.   Assessment & Plan:   Active Problems:   Encephalopathy  Altered mental status/encephalopathy: Toxic metabolic encephalopathy suspected to be from new baclofen use.  Currently his mental status is back to his baseline.  Alert and oriented.  He has history of memory deficits but no diagnosis of dementia in the past.  Acute respiratory failure due to acute encephalopathy: Had to be intubated for airway protection on presentation.  Currently extubated.  Respiratory status stable  Paroxysmal A. fib: He went to A. fib with RVR during this hospitalization and was started on Cardizem drip.  Cardiology was following.  Currently heart rate is well controlled.  Continue beta-blocker, sotalol.  Also on Eliquis for anticoagulation.  He is currently in normal sinus rhythm.  Coronary artery disease: Status post stenting.  Currently stable.  He was on aspirin and Plavix at home.  Discontinue aspirin now.  Hyponatremia: On reviewing his previous lab works, he has chronic hyponatremia.  Continue to monitor as an outpatient.  DM2: On Januvia and metformin at  home.  Currently on sliding scale insulin.  Resume home medicines on discharge.  Debility/deconditioning: Patient evaluated by physical therapy and Occupational Therapy and recommended skilled nursing facility.  Patient lives in independent apartment and is willing to go back there.  He denies skilled nursing facility. But as per the daughter, independent living apartment has rehab services.  So hopefully he can be discharged there.  Case manager/social worker aware.          DVT prophylaxis: eliquis Code Status: Full Family Communication: Daughter on phone Disposition Plan:Back to independent living apartment with rehabilitation.  Case manager working on that.  Hopefully can be discharged tomorrow.   Consultants: PCCM,cardiology  Procedures: Intubation/extubation  Antimicrobials:  Anti-infectives (From admission, onward)   Start     Dose/Rate Route Frequency Ordered Stop   07/23/19 1800  piperacillin-tazobactam (ZOSYN) IVPB 3.375 g  Status:  Discontinued     3.375 g 12.5 mL/hr over 240 Minutes Intravenous Every 8 hours 07/23/19 1655 07/24/19 0832   07/23/19 1430  vancomycin (VANCOCIN) 1,250 mg in sodium chloride 0.9 % 250 mL IVPB  Status:  Discontinued     1,250 mg 166.7 mL/hr over 90 Minutes Intravenous Every 24 hours 07/23/19 1424 07/24/19 0832   07/23/19 1415  piperacillin-tazobactam (ZOSYN) IVPB 3.375 g  Status:  Discontinued     3.375 g 12.5 mL/hr over 240 Minutes Intravenous Every 8 hours 07/23/19 1406 07/23/19 1655      Subjective:  Patient seen and examined the bedside this morning.  Hemodynamically stable, very comfortable.  Alert and oriented and back to his baseline.  Denies  any complaints.   Objective: Vitals:   07/26/19 0900 07/26/19 1000 07/26/19 1100 07/26/19 1200  BP: (!) 124/54 138/66 (!) 165/71 (!) 169/77  Pulse: 70 69 64 62  Resp: 13 18 16 17   Temp:    97.8 F (36.6 C)  TempSrc:    Oral  SpO2: 95% 98% 99% 96%  Weight:      Height:         Intake/Output Summary (Last 24 hours) at 07/26/2019 1306 Last data filed at 07/26/2019 1200 Gross per 24 hour  Intake 121.59 ml  Output 3250 ml  Net -3128.41 ml   Filed Weights   07/24/19 0500 07/25/19 0452 07/26/19 0438  Weight: 93.1 kg 91.9 kg 90.5 kg    Examination:  General exam: Appears calm and comfortable ,Not in distress,average built, pleasant elderly gentleman. HEENT:PERRL,Oral mucosa moist, Ear/Nose normal on gross exam Respiratory system: Bilateral equal air entry, normal vesicular breath sounds, no wheezes or crackles  Cardiovascular system: S1 & S2 heard, RRR. No JVD, murmurs, rubs, gallops or clicks. No pedal edema. Gastrointestinal system: Abdomen is nondistended, soft and nontender. No organomegaly or masses felt. Normal bowel sounds heard. Central nervous system: Alert and oriented. No focal neurological deficits. Extremities: No edema, no clubbing ,no cyanosis, distal peripheral pulses palpable. Skin: No rashes, lesions or ulcers,no icterus ,no pallor     Data Reviewed: I have personally reviewed following labs and imaging studies  CBC: Recent Labs  Lab 07/23/19 1207 07/23/19 1404 07/24/19 0438 07/24/19 1144 07/25/19 0316 07/26/19 0338  WBC 11.6*  --  14.0*  --  10.8* 9.2  NEUTROABS 9.1*  --   --   --   --   --   HGB 13.2 12.9* 12.4* 11.9* 11.0* 11.1*  HCT 38.8* 38.0* 35.7* 35.0* 32.9* 32.9*  MCV 82.9  --  81.5  --  83.5 83.9  PLT 235  --  190  --  204 [id number]   Basic Metabolic Panel: Recent Labs  Lab 07/23/19 1207 07/23/19 1404 07/23/19 1447 07/23/19 1840 07/24/19 0438 07/25/19 0519 07/26/19 0752  NA 128* 129*  --  129* 129* 128* 128*  K 4.2 3.8  --  4.2 3.3* 3.5 3.8  CL 91*  --   --  93* 94* 94* 93*  CO2 25  --   --  25 23 21* 24  GLUCOSE 179*  --   --  174* 122* 160* 149*  BUN 24*  --   --  21 16 10 11   CREATININE 1.58*  --  1.24 1.27* 1.04 1.12 1.09  CALCIUM 9.4  --   --  9.1 8.7* 8.6* 8.4*  MG  --   --  1.9  --   --  1.8  --    GFR:  Estimated Creatinine Clearance: 60.2 mL/min (by C-G formula based on SCr of 1.09 mg/dL). Liver Function Tests: Recent Labs  Lab 07/23/19 1207  AST 50*  ALT 22  ALKPHOS 62  BILITOT 1.2  PROT 7.5  ALBUMIN 4.1   No results for input(s): LIPASE, AMYLASE in the last 168 hours. Recent Labs  Lab 07/23/19 1840  AMMONIA 11   Coagulation Profile: Recent Labs  Lab 07/23/19 1840  INR 1.2   Cardiac Enzymes: Recent Labs  Lab 07/23/19 1447  CKTOTAL 1,617*   BNP (last 3 results) No results for input(s): PROBNP in the last 8760 hours. HbA1C: Recent Labs    07/23/19 1840  HGBA1C 6.3*   CBG: Recent Labs  Lab 07/25/19  1950 07/26/19 0016 07/26/19 0433 07/26/19 0731 07/26/19 1138  GLUCAP 142* 174* 137* 132* 152*   Lipid Profile: No results for input(s): CHOL, HDL, LDLCALC, TRIG, CHOLHDL, LDLDIRECT in the last 72 hours. Thyroid Function Tests: No results for input(s): TSH, T4TOTAL, FREET4, T3FREE, THYROIDAB in the last 72 hours. Anemia Panel: No results for input(s): VITAMINB12, FOLATE, FERRITIN, TIBC, IRON, RETICCTPCT in the last 72 hours. Sepsis Labs: Recent Labs  Lab 07/23/19 1211 07/23/19 1840  LATICACIDVEN 2.9* 2.5*    Recent Results (from the past 240 hour(s))  Blood culture (routine x 2)     Status: None (Preliminary result)   Collection Time: 07/23/19 12:08 PM   Specimen: BLOOD  Result Value Ref Range Status   Specimen Description BLOOD SITE NOT SPECIFIED  Final   Special Requests   Final    BOTTLES DRAWN AEROBIC AND ANAEROBIC Blood Culture results may not be optimal due to an excessive volume of blood received in culture bottles   Culture   Final    NO GROWTH 2 DAYS Performed at [facility], [place] [address], [place], [place] [address]    Report Status PENDING  Incomplete  SARS Coronavirus 2 [facility] order, Performed in [facility] hospital lab) Nasopharyngeal Nasopharyngeal Swab     Status: None   Collection Time: 07/23/19 12:22 PM   Specimen:  Nasopharyngeal Swab  Result Value Ref Range Status   SARS Coronavirus 2 NEGATIVE NEGATIVE Final    Comment: (NOTE) If result is NEGATIVE SARS-CoV-2 target nucleic acids are NOT DETECTED. The SARS-CoV-2 RNA is generally detectable in upper and lower  respiratory specimens during the acute phase of infection. The lowest  concentration of SARS-CoV-2 viral copies this assay can detect is 250  copies / mL. A negative result does not preclude SARS-CoV-2 infection  and should not be used as the sole basis for treatment or other  patient management decisions.  A negative result may occur with  improper specimen collection / handling, submission of specimen other  than nasopharyngeal swab, presence of viral mutation(s) within the  areas targeted by this assay, and inadequate number of viral copies  (<250 copies / mL). A negative result must be combined with clinical  observations, patient history, and epidemiological information. If result is POSITIVE SARS-CoV-2 target nucleic acids are DETECTED. The SARS-CoV-2 RNA is generally detectable in upper and lower  respiratory specimens dur ing the acute phase of infection.  Positive  results are indicative of active infection with SARS-CoV-2.  Clinical  correlation with patient history and other diagnostic information is  necessary to determine patient infection status.  Positive results do  not rule out bacterial infection or co-infection with other viruses. If result is PRESUMPTIVE POSTIVE SARS-CoV-2 nucleic acids MAY BE PRESENT.   A presumptive positive result was obtained on the submitted specimen  and confirmed on repeat testing.  While 2019 novel coronavirus  (SARS-CoV-2) nucleic acids may be present in the submitted sample  additional confirmatory testing may be necessary for epidemiological  and / or clinical management purposes  to differentiate between  SARS-CoV-2 and other Sarbecovirus currently known to infect humans.  If clinically  indicated additional testing with an alternate test  methodology [phone]) is advised. The SARS-CoV-2 RNA is generally  detectable in upper and lower respiratory sp ecimens during the acute  phase of infection. The expected result is Negative. Fact Sheet for Patients:  StrictlyIdeas.no Fact Sheet for Healthcare Providers: BankingDealers.co.za This test is not yet approved or cleared by  the [employer] and has been authorized for detection and/or diagnosis of SARS-CoV-2 by FDA under an Emergency Use Authorization (EUA).  This EUA will remain in effect (meaning this test can be used) for the duration of the COVID-19 declaration under Section 564(b)(1) of the Act, 21 U.S.C. section 360bbb-3(b)(1), unless the authorization is terminated or revoked sooner. Performed at [facility], [place] [address], [place], [place] [address]   Urine culture     Status: None   Collection Time: 07/23/19 12:24 PM   Specimen: Urine, Random  Result Value Ref Range Status   Specimen Description URINE, RANDOM  Final   Special Requests NONE  Final   Culture   Final    NO GROWTH Performed at [facility], [place] [address] [place], [place] [address]    Report Status 07/24/2019 FINAL  Final  Blood culture (routine x 2)     Status: None (Preliminary result)   Collection Time: 07/23/19  1:26 PM   Specimen: BLOOD LEFT FOREARM  Result Value Ref Range Status   Specimen Description BLOOD LEFT FOREARM  Final   Special Requests   Final    BOTTLES DRAWN AEROBIC AND ANAEROBIC Blood Culture results may not be optimal due to an inadequate volume of blood received in culture bottles   Culture   Final    NO GROWTH 2 DAYS Performed at [facility], [place] [address] [place], [place] [address]    Report Status PENDING  Incomplete  MRSA PCR Screening     Status: None   Collection Time: 07/23/19  5:30 PM   Specimen: Nasal Mucosa; Nasopharyngeal  Result  Value Ref Range Status   MRSA by PCR NEGATIVE NEGATIVE Final    Comment:        The GeneXpert MRSA Assay (FDA approved for NASAL specimens only), is one component of a comprehensive MRSA colonization surveillance program. It is not intended to diagnose MRSA infection nor to guide or monitor treatment for MRSA infections. Performed at [facility], [place] [address] [place], [place] [address]          Radiology Studies: No results found.      Scheduled Meds: . apixaban  5 mg Oral BID  . Chlorhexidine Gluconate Cloth  6 each Topical Daily  . clopidogrel  75 mg Oral Daily  . insulin aspart  0-15 Units Subcutaneous Q4H  . mouth rinse  15 mL Mouth Rinse BID  . memantine  5 mg Oral Daily  . metoprolol tartrate  50 mg Oral BID  . pantoprazole  40 mg Oral Daily  . polyethylene glycol  17 g Oral Daily  . ramelteon  8 mg Oral QHS  . senna  2 tablet Oral Daily  . sotalol  80 mg Oral Q12H   Continuous Infusions: . diltiazem (CARDIZEM) infusion Stopped (07/25/19 1319)     LOS: 3 days    Time spent: 35 mins.More than 50% of that time was spent in counseling and/or coordination of care.      Shelly Coss, MD [facility] Pager [phone]  If 7PM-7AM, please contact night-coverage [email or web address] Password [email or web address] 07/26/2019, 1:06 PM

## 2019-07-26 NOTE — Progress Notes (Signed)
Subjective:  Denies SSCP, palpitations or Dyspnea   Objective:  Vitals:   07/26/19 0500 07/26/19 0600 07/26/19 0700 07/26/19 0800  BP: (!) 168/70 (!) 134/47 (!) 162/69 136/73  Pulse: 66 (!) 59 67 79  Resp: 17 (!) 8 14 17   Temp:    (!) 97.5 F (36.4 C)  TempSrc:    Oral  SpO2: 97% 96% 95% 94%  Weight:      Height:        Intake/Output from previous day:  Intake/Output Summary (Last 24 hours) at 07/26/2019 0843 Last data filed at 07/26/2019 0800 Gross per 24 hour  Intake 88.24 ml  Output 3125 ml  Net -3036.76 ml    Physical Exam:  Affect appropriate Healthy:  appears stated age HEENT: normal Neck supple with no adenopathy JVP normal no bruits no thyromegaly Lungs clear with no wheezing and good diaphragmatic motion Heart:  S1/S2 no murmur, no rub, gallop or click PMI normal Abdomen: benighn, BS positve, no tenderness, no AAA no bruit.  No HSM or HJR Distal pulses intact with no bruits No edema Neuro non-focal Skin warm and dry No muscular weakness   Lab Results: Basic Metabolic Panel: Recent Labs    07/23/19 1447  07/25/19 0519 07/26/19 0752  NA  --    < > 128* 128*  K  --    < > 3.5 3.8  CL  --    < > 94* 93*  CO2  --    < > 21* 24  GLUCOSE  --    < > 160* 149*  BUN  --    < > 10 11  CREATININE 1.24   < > 1.12 1.09  CALCIUM  --    < > 8.6* 8.4*  MG 1.9  --  1.8  --    < > = values in this interval not displayed.   Liver Function Tests: Recent Labs    07/23/19 1207  AST 50*  ALT 22  ALKPHOS 62  BILITOT 1.2  PROT 7.5  ALBUMIN 4.1   No results for input(s): LIPASE, AMYLASE in the last 72 hours. CBC: Recent Labs    07/23/19 1207  07/25/19 0316 07/26/19 0338  WBC 11.6*   < > 10.8* 9.2  NEUTROABS 9.1*  --   --   --   HGB 13.2   < > 11.0* 11.1*  HCT 38.8*   < > 32.9* 32.9*  MCV 82.9   < > 83.5 83.9  PLT 235   < > 204 217   < > = values in this interval not displayed.   Cardiac Enzymes: Recent Labs    07/23/19 1447  CKTOTAL  1,617*   BNP: Invalid input(s): POCBNP D-Dimer: No results for input(s): DDIMER in the last 72 hours. Hemoglobin A1C: Recent Labs    07/23/19 1840  HGBA1C 6.3*   Fasting Lipid Panel: No results for input(s): CHOL, HDL, LDLCALC, TRIG, CHOLHDL, LDLDIRECT in the last 72 hours. Thyroid Function Tests: Recent Labs    07/23/19 1237  TSH 0.512   Anemia Panel: No results for input(s): VITAMINB12, FOLATE, FERRITIN, TIBC, IRON, RETICCTPCT in the last 72 hours.  Imaging: No results found.  Cardiac Studies:  ECG: afib QT 326    Telemetry:  NSR rate in 70's   Echo:   Medications:   . apixaban  5 mg Oral BID  . aspirin  81 mg Oral Daily  . Chlorhexidine Gluconate Cloth  6 each Topical Daily  .  clopidogrel  75 mg Oral Daily  . insulin aspart  0-15 Units Subcutaneous Q4H  . mouth rinse  15 mL Mouth Rinse BID  . memantine  5 mg Oral Daily  . metoprolol tartrate  50 mg Oral BID  . pantoprazole  40 mg Oral Daily  . ramelteon  8 mg Oral QHS  . sotalol  80 mg Oral Q12H     . diltiazem (CARDIZEM) infusion Stopped (07/25/19 1319)    Assessment/Plan:   1. PAF:  Converted to NSR with oral sotolol continue eliquis. ECG this am to check QT outpatient f/u at [facility]  2. DM:  Discussed low carb diet.  Target hemoglobin A1c is 6.5 or less.  Continue current medications. 3. CAD. CAD multiple stents most recent June 2020 to RCA. Continue ASA/Plavix consider stopping ASA given fall risk and need for anticoagulation   Jenkins Rouge 07/26/2019, 8:43 AM

## 2019-07-26 NOTE — TOC Initial Note (Addendum)
Transition of Care [facility]) - Initial/Assessment Note    Patient Details  Name: Duane [name] MRN: [id number] Date of Birth: 10/20/1935  [facility] CM/SW Contact:    Bartholomew Crews, RN Phone Number: [phone] 07/26/2019, 3:57 PM  Clinical Narrative:                 Spoke with patient at the bedside. PTA living at [place] apartment. Widowed. Independent. Has a reclaimed electric wheelchair, but has not needed it. Discussed plans for transition of care. Patient wanting to return to his independent living apartment, but is agreeable to STR. Permission granted to speak with his son, Chip, and daughter, [name].  Spoke with son, Chip, on the phone. Agreeable to STR at [facility]. Spoke with Luellen Pucker at [employer] - process for transition initiated.   CM following for transition of care needs.   Expected Discharge Plan: Skilled Nursing Facility Barriers to Discharge: Continued Medical Work up   Patient Goals and CMS Choice Patient states their goals for this hospitalization and ongoing recovery are:: return home to his apartment CMS Medicare.gov Compare Post Acute Care list provided to:: Patient Choice offered to / list presented to : Patient, Adult Children  Expected Discharge Plan and Services Expected Discharge Plan: [place] In-house Referral: Clinical Social Work Discharge Planning Services: CM Consult Post Acute Care Choice: [place] arrangements for the past 2 months: Independent Living Facility([facility])                 DME Arranged: N/A DME Agency: NA       HH Arranged: NA [place] Agency: NA        Prior Living Arrangements/Services Living arrangements for the past 2 months: Independent Living Facility([facility]) Lives with:: Self Patient language and need for interpreter reviewed:: Yes Do you feel safe going back to the place where you live?: Yes      Need for Family Participation in  Patient Care: Yes (Comment) Care giver support system in place?: Yes (comment) Current home services: Other (comment)(has a reclaimed Clinical research associate) Criminal Activity/Legal Involvement Pertinent to Current Situation/Hospitalization: No - Comment as needed  Activities of Daily Living      Permission Sought/Granted Permission sought to share information with : Family Supports    Share Information with NAME: chip Coia(may also speak to daughter, Meredith Staggers)     Permission granted to share info w Relationship: son  Permission granted to share info w Contact Information: [phone]  Emotional Assessment Appearance:: Appears stated age Attitude/Demeanor/Rapport: Engaged Affect (typically observed): Accepting Orientation: : Oriented to Self, Oriented to Place, Oriented to  Time, Oriented to Situation Alcohol / Substance Use: Not Applicable Psych Involvement: No (comment)  Admission diagnosis:  Altered mental status, unspecified altered mental status type [R41.82] Patient Active Problem List   Diagnosis Date Noted  . Encephalopathy 07/23/2019  . Respiratory failure with hypoxia ([place]) 09/08/2016  . Abdominal pain, right upper quadrant 09/08/2016  . Essential hypertension 09/08/2016  . PAF (paroxysmal atrial fibrillation) ([place]) 09/08/2016  . Diabetes mellitus type 2, controlled ([place]) 09/08/2016  . ARF (acute renal failure) ([place]) 09/08/2016  . Normochromic normocytic anemia 09/08/2016  . Abdominal pain 09/08/2016   PCP:  [name], MD Pharmacy:   [facility], [place] - [address] [place] [address] Phone: [phone] Fax: [place], [place] [facility] [address] [employer]  [place] [address] Phone: [phone] Fax: [phone]     Social Determinants of Health (SDOH) Interventions    Readmission Risk Interventions No flowsheet data found.

## 2019-07-26 NOTE — Progress Notes (Signed)
Physical Therapy Treatment Patient Details Name: Duane [name] MRN: [id number] DOB: 10-05-1935 Today's Date: 07/26/2019    History of Present Illness patient is an 84 year old male admitted after being found down by staff at senior independent living home. Patient admitted with AMS, encephalopathy, intubated to protect his airway. Extubated now. Questionable left sided weakness, testing negative for CVA.    PT Comments    Patient more alert, moving all 4 extremities with improved strength, following commands for supine and seated exercises. He will do well with continued therapies at SNF   Follow Up Recommendations  SNF(at [facility])     Equipment Recommendations  Other (comment)(TBD)    Recommendations for Other Services       Precautions / Restrictions Precautions Precautions: Fall Restrictions Weight Bearing Restrictions: No    Mobility  Bed Mobility Overal bed mobility: Needs Assistance Bed Mobility: Supine to Sit     Supine to sit: HOB elevated;Min guard     General bed mobility comments: pt completed task with minguard assist (due to air mattress)   Transfers Overall transfer level: Needs assistance Equipment used: 1 person hand held assist;2 person hand held assist Transfers: Sit to/from [facility] Sit to Stand: Min assist;Mod assist Stand pivot transfers: Mod assist;+2 physical assistance;+2 safety/equipment       General transfer comment: pt with posterior bias and due to neuropathy cannot feel that he is leaning back on his heels; each of 3 sit to stands improved and ultimately able to take shuffling steps to the recliner on his left  Ambulation/Gait             General Gait Details: deferred   Stairs             Wheelchair Mobility    Modified Rankin (Stroke Patients Only)       Balance Overall balance assessment: Needs assistance Sitting-balance support: Feet supported Sitting balance-Leahy Scale:  Fair Sitting balance - Comments: at edge of ICU bed   Standing balance support: Bilateral upper extremity supported Standing balance-Leahy Scale: Poor Standing balance comment: posterior lean with difficulty correccting                            Cognition Arousal/Alertness: Awake/alert Behavior During Therapy: WFL for tasks assessed/performed Overall Cognitive Status: Impaired/Different from baseline Area of Impairment: Attention;Orientation;Memory;Safety/judgement;Awareness                 Orientation Level: Disoriented to;Time Current Attention Level: Selective Memory: Decreased short-term memory Following Commands: Follows one step commands consistently Safety/Judgement: Decreased awareness of safety;Decreased awareness of deficits Awareness: Emergent Problem Solving: Requires verbal cues;Requires tactile cues;Difficulty sequencing General Comments: decr awareness of current deficits, but showed emergent awareness after first sit to stand       Exercises Low Level/ICU Exercises Ankle Circles/Pumps: AROM;Both;10 reps Short Arc Quad: AROM;Both;5 reps Heel Slides: AROM;Both;5 reps Other Exercises Other Exercises: also seated marching and knee extension at EOB    General Comments General comments (skin integrity, edema, etc.): per daughter, patient able to correctly provide history of walking 3-5 miles per day around a pond at [facility]      Pertinent Vitals/Pain Pain Assessment: Faces Faces Pain Scale: Hurts even more Pain Location:  R hip Pain Descriptors / Indicators: Discomfort;Grimacing;Sore Pain Intervention(s): Limited activity within patient's tolerance    Home Living  Prior Function            PT Goals (current goals can now be found in the care plan section) Acute Rehab PT Goals Patient Stated Goal: regain his independence Time For Goal Achievement: 08/04/19 Potential to Achieve Goals: Good Progress  towards PT goals: Progressing toward goals    Frequency    Min 3X/week      PT Plan Current plan remains appropriate    Co-evaluation              AM-PAC PT "6 Clicks" Mobility   Outcome Measure  Help needed turning from your back to your side while in a flat bed without using bedrails?: A Little Help needed moving from lying on your back to sitting on the side of a flat bed without using bedrails?: A Little Help needed moving to and from a bed to a chair (including a wheelchair)?: A Lot Help needed standing up from a chair using your arms (e.g., wheelchair or bedside chair)?: A Lot Help needed to walk in hospital room?: Total Help needed climbing 3-5 steps with a railing? : Total 6 Click Score: 12    End of Session Equipment Utilized During Treatment: Gait belt Activity Tolerance: Patient tolerated treatment well Patient left: with family/visitor present;in chair Nurse Communication: Mobility status;Other (comment)(sitting upright with feet on the floor with dtr present) PT Visit Diagnosis: Muscle weakness (generalized) (M62.81);Other abnormalities of gait and mobility (R26.89);History of falling (Z91.81);Difficulty in walking, not elsewhere classified (R26.2)     Time: [id number] PT Time Calculation (min) (ACUTE ONLY): 40 min  Charges:  $Therapeutic Activity: 38-52 mins                       Barry Brunner, PT       Rexanne Mano 07/26/2019, 3:38 PM

## 2019-07-27 LAB — BASIC METABOLIC PANEL
Anion gap: 12 (ref 5–15)
BUN: 14 mg/dL (ref 8–23)
CO2: 24 mmol/L (ref 22–32)
Calcium: 9 mg/dL (ref 8.9–10.3)
Chloride: 98 mmol/L (ref 98–111)
Creatinine, Ser: 1.1 mg/dL (ref 0.61–1.24)
GFR calc Af Amer: >60 mL/min (ref >60)
GFR calc non Af Amer: >60 mL/min (ref >60)
Glucose, Bld: 160 mg/dL — ABNORMAL HIGH (ref 70–99)
Potassium: 3.8 mmol/L (ref 3.5–5.1)
Sodium: 134 mmol/L — ABNORMAL LOW (ref 135–145)

## 2019-07-27 LAB — GLUCOSE, CAPILLARY
Glucose-Capillary: 155 mg/dL — ABNORMAL HIGH (ref 70–99)
Glucose-Capillary: 133 mg/dL — ABNORMAL HIGH (ref 70–99)
Glucose-Capillary: 178 mg/dL — ABNORMAL HIGH (ref 70–99)

## 2019-07-27 MED ORDER — METOPROLOL TARTRATE 50 MG PO TABS: 50.0000 mg | ORAL_TABLET | Freq: Two times a day (BID) | ORAL | 2 refills | Status: DC

## 2019-07-27 MED ORDER — LOSARTAN POTASSIUM 50 MG PO TABS
100.0000 mg | ORAL_TABLET | Freq: Every day | ORAL | Status: DC
  Administered 2019-07-27: 100 mg via ORAL
  Filled 2019-07-27: qty 2

## 2019-07-27 MED ORDER — METOPROLOL TARTRATE 50 MG PO TABS: 50.0000 mg | ORAL_TABLET | Freq: Two times a day (BID) | ORAL | Status: DC

## 2019-07-27 MED ORDER — HYDRALAZINE HCL 20 MG/ML IJ SOLN: 10.0000 mg | INTRAMUSCULAR | Status: DC | PRN

## 2019-07-27 MED ORDER — PANTOPRAZOLE SODIUM 40 MG PO TBEC: 40.0000 mg | DELAYED_RELEASE_TABLET | Freq: Every day | ORAL | 0 refills | Status: DC

## 2019-07-27 MED ORDER — POLYETHYLENE GLYCOL 3350 17 G PO PACK: 17.0000 g | PACK | Freq: Every day | ORAL | 0 refills | Status: DC

## 2019-07-27 NOTE — Progress Notes (Signed)
    Subjective:  Denies SSCP, palpitations or Dyspnea Sitting up in bed  Objective:  Vitals:   07/27/19 0400 07/27/19 0407 07/27/19 0408 07/27/19 0800  BP: (!) 166/95   (!) 175/85  Pulse: 78   90  Resp: 15   16  Temp:  (!) 97.5 F (36.4 C)  97.7 F (36.5 C)  TempSrc:  Oral  Oral  SpO2: 96%   97%  Weight:   90.1 kg   Height:        Intake/Output from previous day:  Intake/Output Summary (Last 24 hours) at 07/27/2019 [id number] Last data filed at 07/27/2019 0600 Gross per 24 hour  Intake 120 ml  Output 4175 ml  Net -4055 ml    Physical Exam:  Affect appropriate Healthy:  appears stated age HEENT: normal Neck supple with no adenopathy JVP normal no bruits no thyromegaly Lungs clear with no wheezing and good diaphragmatic motion Heart:  S1/S2 no murmur, no rub, gallop or click PMI normal Abdomen: benighn, BS positve, no tenderness, no AAA no bruit.  No HSM or HJR Distal pulses intact with no bruits No edema Neuro non-focal Skin warm and dry No muscular weakness   Lab Results: Basic Metabolic Panel: Recent Labs    07/25/19 0519 07/26/19 0752 07/27/19 0447  NA 128* 128* 134*  K 3.5 3.8 3.8  CL 94* 93* 98  CO2 21* 24 24  GLUCOSE 160* 149* 160*  BUN 10 11 14   CREATININE 1.12 1.09 1.10  CALCIUM 8.6* 8.4* 9.0  MG 1.8  --   --    Liver Function Tests: No results for input(s): AST, ALT, ALKPHOS, BILITOT, PROT, ALBUMIN in the last 72 hours. No results for input(s): LIPASE, AMYLASE in the last 72 hours. CBC: Recent Labs    07/25/19 0316 07/26/19 0338  WBC 10.8* 9.2  HGB 11.0* 11.1*  HCT 32.9* 32.9*  MCV 83.5 83.9  PLT 204 217    Imaging: No results found.  Cardiac Studies:  ECG: afib QT 326    Telemetry:  NSR rate in 70's   Echo:   Medications:   . apixaban  5 mg Oral BID  . Chlorhexidine Gluconate Cloth  6 each Topical Daily  . clopidogrel  75 mg Oral Daily  . insulin aspart  0-15 Units Subcutaneous Q4H  . mouth rinse  15 mL Mouth Rinse BID   . memantine  5 mg Oral Daily  . metoprolol tartrate  50 mg Oral BID  . pantoprazole  40 mg Oral Daily  . polyethylene glycol  17 g Oral Daily  . ramelteon  8 mg Oral QHS  . senna  2 tablet Oral Daily  . sotalol  80 mg Oral Q12H       Assessment/Plan:   1. PAF:  Converted to NSR with oral sotolol continue eliquis. QT 458  Ok to transfer to rehab and stop telemetry Outpatient f/u with his cardiologist at [facility] 2. DM:  Discussed low carb diet.  Target hemoglobin A1c is 6.5 or less.  Continue current medications. 3. CAD. CAD multiple stents most recent June 2020 to RCA. Continue ASA/Plavix consider stopping ASA given fall risk and need for anticoagulation   Will sign off  Jenkins Rouge 07/27/2019, 9:18 AM

## 2019-07-27 NOTE — Progress Notes (Signed)
PROGRESS NOTE    Duane [name]  [id number] DOB: 06/25/1935 DOA: 07/23/2019 PCP: [name], MD    Brief Narrative: 84 year old male with prior h/o atrial fib on eliquis, OSA, cad S/P PCI, DM, Stage 3 ckd, was found on the floor obtunded, underwent intubation on 07/23/19 . He was admitted to [facility] service initially and transferred to [facility] on 9/10. He was successfully extubated on 07/24/19.  Pt seen and examined and no complaints today.   Assessment & Plan:   Active Problems:   Encephalopathy   Acute toxic and metabolic encephalopathy from medications and dehydration.  Resolved. He is alert and oriented to place and person. He has memory deficits at baseline.  MRI/MRA of the head and neck unrevealing.  EEG is non focal. Neurology recommended outpatient follow up in 4 to 6 weeks post discharge.  No indication for anti epileptics at this time.      Acute respiratory failure from acute encephalopathy:  Intubated for airway protection, extubated on 9/8. No respiratory issues.   Hypertensive urgency:  Currently on sotalol and losartan added today.  Prn hydralazine on board.     Dementia without any behavioral abnormalities.  Continue with aricept.    Syncope. Recent cath in July 2020, recommended medical management.  Echocardiogram showed LVef of 55 to 60% with normal diastolic parameters.  Seen and evaluated by cardiology and recommended to follow u p with plavix and eliquis, sotalol, losartan.    Hyperlipidemia;  Resume statin.    Diabetes mellitus:  CBG (last 3)  Recent Labs    07/27/19 0401 07/27/19 0725 07/27/19 1144  GLUCAP 155* 133* 178*   Resume SSI. Resume home meds on discharge.    Chronic hyponatremia:  Sodium better this am.  He is alert and answering questions appropriately.     CAD s/p multiple PCI'S  No chest pain or sob. Continue with plavix, losartan, pravachol, sotalol.   afib with RVR.  PAF: Currently in sinus with prolonged QTc.   Resume sotalol and eliquis.  Recommend outpatient follow up with cardiology at [place].  Cardiology has stopped the aspirin due to increase risk of bleeding.    Acute on Stage 3 CKD:  Improved with hydration Creatinine at baseline today.    Deconditioning and debility: PT/OT eval recommending SNF.  Case management working on SNF.    DVT prophylaxis: ELIQUIS.  Code Status:  FULL CODE. Family Communication:none at bedside. Disposition Plan: pending SNF placement.    Consultants:   Cardiology.   Procedures: none.  Antimicrobials: none.   Subjective: Not in distress and denies any new complaints.   Objective: Vitals:   07/27/19 0936 07/27/19 1000 07/27/19 1119 07/27/19 1200  BP: (!) 175/85 (!) 139/112 (!) 175/83   Pulse: 90 87    Resp:  17    Temp:    97.7 F (36.5 C)  TempSrc:    Oral  SpO2:  97%    Weight:      Height:        Intake/Output Summary (Last 24 hours) at 07/27/2019 1244 Last data filed at 07/27/2019 0600 Gross per 24 hour  Intake -  Output 3750 ml  Net -3750 ml   Filed Weights   07/25/19 0452 07/26/19 0438 07/27/19 0408  Weight: 91.9 kg 90.5 kg 90.1 kg    Examination:  General exam: Appears calm and comfortable  Respiratory system: Clear to auscultation. Respiratory effort normal. Cardiovascular system: S1 & S2 heard, RRR.  No pedal edema. Gastrointestinal  system: Abdomen is nondistended, soft and nontender. No organomegaly or masses felt. Normal bowel sounds heard. Central nervous system: Alert but confused. Oriented to person and place only.  Extremities: Symmetric 5 x 5 power. Skin: No rashes, lesions or ulcers Psychiatry:  Mood & affect appropriate.     Data Reviewed: I have personally reviewed following labs and imaging studies  CBC: Recent Labs  Lab 07/23/19 1207 07/23/19 1404 07/24/19 0438 07/24/19 1144 07/25/19 0316 07/26/19 0338  WBC 11.6*  --  14.0*  --  10.8* 9.2  NEUTROABS 9.1*  --   --   --   --   --   HGB  13.2 12.9* 12.4* 11.9* 11.0* 11.1*  HCT 38.8* 38.0* 35.7* 35.0* 32.9* 32.9*  MCV 82.9  --  81.5  --  83.5 83.9  PLT 235  --  190  --  204 [id number]   Basic Metabolic Panel: Recent Labs  Lab 07/23/19 1447 07/23/19 1840 07/24/19 0438 07/25/19 0519 07/26/19 0752 07/27/19 0447  NA  --  129* 129* 128* 128* 134*  K  --  4.2 3.3* 3.5 3.8 3.8  CL  --  93* 94* 94* 93* 98  CO2  --  25 23 21* 24 24  GLUCOSE  --  174* 122* 160* 149* 160*  BUN  --  21 16 10 11 14   CREATININE 1.24 1.27* 1.04 1.12 1.09 1.10  CALCIUM  --  9.1 8.7* 8.6* 8.4* 9.0  MG 1.9  --   --  1.8  --   --    GFR: Estimated Creatinine Clearance: 55.1 mL/min (by C-G formula based on SCr of 1.1 mg/dL). Liver Function Tests: Recent Labs  Lab 07/23/19 1207  AST 50*  ALT 22  ALKPHOS 62  BILITOT 1.2  PROT 7.5  ALBUMIN 4.1   No results for input(s): LIPASE, AMYLASE in the last 168 hours. Recent Labs  Lab 07/23/19 1840  AMMONIA 11   Coagulation Profile: Recent Labs  Lab 07/23/19 1840  INR 1.2   Cardiac Enzymes: Recent Labs  Lab 07/23/19 1447  CKTOTAL 1,617*   BNP (last 3 results) No results for input(s): PROBNP in the last 8760 hours. HbA1C: No results for input(s): HGBA1C in the last 72 hours. CBG: Recent Labs  Lab 07/26/19 1922 07/26/19 2348 07/27/19 0401 07/27/19 0725 07/27/19 1144  GLUCAP 229* 93 155* 133* 178*   Lipid Profile: No results for input(s): CHOL, HDL, LDLCALC, TRIG, CHOLHDL, LDLDIRECT in the last 72 hours. Thyroid Function Tests: No results for input(s): TSH, T4TOTAL, FREET4, T3FREE, THYROIDAB in the last 72 hours. Anemia Panel: No results for input(s): VITAMINB12, FOLATE, FERRITIN, TIBC, IRON, RETICCTPCT in the last 72 hours. Sepsis Labs: Recent Labs  Lab 07/23/19 1211 07/23/19 1840  LATICACIDVEN 2.9* 2.5*    Recent Results (from the past 240 hour(s))  Blood culture (routine x 2)     Status: None (Preliminary result)   Collection Time: 07/23/19 12:08 PM   Specimen: BLOOD   Result Value Ref Range Status   Specimen Description BLOOD SITE NOT SPECIFIED  Final   Special Requests   Final    BOTTLES DRAWN AEROBIC AND ANAEROBIC Blood Culture results may not be optimal due to an excessive volume of blood received in culture bottles   Culture   Final    NO GROWTH 4 DAYS Performed at [facility], [place] [address], [place], [place] [address]    Report Status PENDING  Incomplete  SARS Coronavirus 2 [facility] order, Performed  in [place] lab) Nasopharyngeal Nasopharyngeal Swab     Status: None   Collection Time: 07/23/19 12:22 PM   Specimen: Nasopharyngeal Swab  Result Value Ref Range Status   SARS Coronavirus 2 NEGATIVE NEGATIVE Final    Comment: (NOTE) If result is NEGATIVE SARS-CoV-2 target nucleic acids are NOT DETECTED. The SARS-CoV-2 RNA is generally detectable in upper and lower  respiratory specimens during the acute phase of infection. The lowest  concentration of SARS-CoV-2 viral copies this assay can detect is 250  copies / mL. A negative result does not preclude SARS-CoV-2 infection  and should not be used as the sole basis for treatment or other  patient management decisions.  A negative result may occur with  improper specimen collection / handling, submission of specimen other  than nasopharyngeal swab, presence of viral mutation(s) within the  areas targeted by this assay, and inadequate number of viral copies  (<250 copies / mL). A negative result must be combined with clinical  observations, patient history, and epidemiological information. If result is POSITIVE SARS-CoV-2 target nucleic acids are DETECTED. The SARS-CoV-2 RNA is generally detectable in upper and lower  respiratory specimens dur ing the acute phase of infection.  Positive  results are indicative of active infection with SARS-CoV-2.  Clinical  correlation with patient history and other diagnostic information is  necessary to determine patient infection status.   Positive results do  not rule out bacterial infection or co-infection with other viruses. If result is PRESUMPTIVE POSTIVE SARS-CoV-2 nucleic acids MAY BE PRESENT.   A presumptive positive result was obtained on the submitted specimen  and confirmed on repeat testing.  While 2019 novel coronavirus  (SARS-CoV-2) nucleic acids may be present in the submitted sample  additional confirmatory testing may be necessary for epidemiological  and / or clinical management purposes  to differentiate between  SARS-CoV-2 and other Sarbecovirus currently known to infect humans.  If clinically indicated additional testing with an alternate test  methodology [phone]) is advised. The SARS-CoV-2 RNA is generally  detectable in upper and lower respiratory sp ecimens during the acute  phase of infection. The expected result is Negative. Fact Sheet for Patients:  StrictlyIdeas.no Fact Sheet for Healthcare Providers: BankingDealers.co.za This test is not yet approved or cleared by the [place] FDA and has been authorized for detection and/or diagnosis of SARS-CoV-2 by FDA under an Emergency Use Authorization (EUA).  This EUA will remain in effect (meaning this test can be used) for the duration of the COVID-19 declaration under Section 564(b)(1) of the Act, 21 U.S.C. section 360bbb-3(b)(1), unless the authorization is terminated or revoked sooner. Performed at [facility], [place] [address] [place], [place] [address]   Urine culture     Status: None   Collection Time: 07/23/19 12:24 PM   Specimen: Urine, Random  Result Value Ref Range Status   Specimen Description URINE, RANDOM  Final   Special Requests NONE  Final   Culture   Final    NO GROWTH Performed at [facility], [place] [address] [place],  [address]    Report Status 07/24/2019 FINAL  Final  Blood culture (routine x 2)     Status: None (Preliminary result)   Collection  Time: 07/23/19  1:26 PM   Specimen: BLOOD LEFT FOREARM  Result Value Ref Range Status   Specimen Description BLOOD LEFT FOREARM  Final   Special Requests   Final    BOTTLES DRAWN AEROBIC AND ANAEROBIC Blood  Culture results may not be optimal due to an inadequate volume of blood received in culture bottles   Culture   Final    NO GROWTH 4 DAYS Performed at [facility], [place] [address], [place], [place] [address]    Report Status PENDING  Incomplete  MRSA PCR Screening     Status: None   Collection Time: 07/23/19  5:30 PM   Specimen: Nasal Mucosa; Nasopharyngeal  Result Value Ref Range Status   MRSA by PCR NEGATIVE NEGATIVE Final    Comment:        The GeneXpert MRSA Assay (FDA approved for NASAL specimens only), is one component of a comprehensive MRSA colonization surveillance program. It is not intended to diagnose MRSA infection nor to guide or monitor treatment for MRSA infections. Performed at [facility], [place] [address], [place], [place] [address]          Radiology Studies: No results found.      Scheduled Meds: . apixaban  5 mg Oral BID  . Chlorhexidine Gluconate Cloth  6 each Topical Daily  . clopidogrel  75 mg Oral Daily  . insulin aspart  0-15 Units Subcutaneous Q4H  . losartan  100 mg Oral Daily  . mouth rinse  15 mL Mouth Rinse BID  . memantine  5 mg Oral Daily  . metoprolol tartrate  50 mg Oral BID  . pantoprazole  40 mg Oral Daily  . polyethylene glycol  17 g Oral Daily  . ramelteon  8 mg Oral QHS  . senna  2 tablet Oral Daily  . sotalol  80 mg Oral Q12H   Continuous Infusions:   LOS: 4 days    Time spent: 32 minutes.     Hosie Poisson, MD [facility] Pager [phone]   If 7PM-7AM, please contact night-coverage [email or web address] Password [facility] 07/27/2019, 12:44 PM

## 2019-07-27 NOTE — TOC Transition Note (Signed)
Transition of Care [facility]) - CM/SW Discharge Note   Patient Details  Name: DEMETRI DALPIAZ MRN: [id number] Date of Birth: 1935/04/23  Transition of Care [facility]) CM/SW Contact:  Bartholomew Crews, RN Phone Number: 07/27/2019, 4:02 PM   Clinical Narrative:    Received call back from [place] at [facility]. Patient accepted back to [facility] in SNF today. MD notified for dc order and summary. Chip-son notified. Angie - daughter to provide transport to [facility]. [facility] aware and provided pandemic guidelines. No further transition of care needs identified.   Final next level of care: Skilled Nursing Facility Barriers to Discharge: No Barriers Identified   Patient Goals and CMS Choice Patient states their goals for this hospitalization and ongoing recovery are:: return to [facility] CMS Medicare.gov Compare Post Acute Care list provided to:: Patient Represenative (must comment)(Chip Mihelic - son) Choice offered to / list presented to : Adult Children  Discharge Placement PASRR number recieved: 07/26/19            Patient [facility] Patient to be transferred to facility by: daughter - Angie Name of family member notified: Chip Nobles Patient and family notified of of transfer: 07/27/19  Discharge Plan and Services In-house Referral: Clinical Social Work Discharge Planning Services: [employer] Consult Post Acute Care Choice: [place]          DME Arranged: N/A DME Agency: NA       HH Arranged: NA [place] Agency: NA        Social Determinants of Health (SDOH) Interventions     Readmission Risk Interventions No flowsheet data found.

## 2019-07-27 NOTE — Progress Notes (Signed)
[name] to be D/C'd Rehab via daughter transport,  [facility].         per MD order.  Discussed with the patient and all questions fully answered.  VSS, Skin clean, dry and intact without evidence of skin break down, no evidence of skin tears noted. IV catheter discontinued intact. Site without signs and symptoms of complications. Dressing and pressure applied.  An After Visit Summary was printed and given to the patient. Patient received prescription.  D/c education completed with patient/family including follow up instructions, medication list, d/c activities limitations if indicated, with other d/c instructions as indicated by MD - patient able to verbalize understanding, all questions fully answered.   Patient instructed to return to ED, call 911, or call MD for any changes in condition.   Patient escorted via [place], and D/C home via private auto. Allergies as of 07/27/2019      Reactions   Baclofen Other (See Comments)   Severe AMS, comatose, intubated   Bee Venom Anaphylaxis   Contrast Media [iodinated Diagnostic Agents] Anaphylaxis   Iodine-131 Anaphylaxis   Metrizamide Anaphylaxis   Metronidazole Anaphylaxis   Atorvastatin Other (See Comments)   Aching in legs   Lisinopril Cough      Medication List    STOP taking these medications   metoprolol succinate 25 MG 24 hr tablet Commonly known as: TOPROL-XL     TAKE these medications   apixaban 5 MG Tabs tablet Commonly known as: ELIQUIS Take 5 mg by mouth 2 (two) times daily.   b complex vitamins tablet Take 1 tablet by mouth daily.   CALCIUM MAGNESIUM PO Take 1 tablet by mouth daily.   calcium-vitamin D 500-200 MG-UNIT tablet Commonly known as: OSCAL WITH D Take 1 tablet by mouth daily with breakfast.   clopidogrel 75 MG tablet Commonly known as: PLAVIX Take 75 mg by mouth daily.   diclofenac sodium 1 % Gel Commonly known as: VOLTAREN Apply 2 g topically daily as needed (For muscle  pain).   EpiPen 2-Pak 0.3 mg/0.3 mL Soaj injection Generic drug: EPINEPHrine Inject 0.3 mg into the muscle once as needed (severe allergic reaction).   losartan 100 MG tablet Commonly known as: COZAAR Take 100 mg by mouth daily.   memantine 5 MG tablet Commonly known as: NAMENDA Take 5 mg by mouth daily.   metFORMIN 1000 MG tablet Commonly known as: GLUCOPHAGE Take 1,000 mg by mouth 2 (two) times daily with a meal.   metoprolol tartrate 50 MG tablet Commonly known as: LOPRESSOR Take 1 tablet (50 mg total) by mouth 2 (two) times daily.   nitroGLYCERIN 0.4 MG SL tablet Commonly known as: NITROSTAT Place 0.4 mg under the tongue every 5 (five) minutes as needed for chest pain.   Omega-3-6-9 Caps Take 2 capsules by mouth 2 (two) times daily.   OVER THE COUNTER MEDICATION Apply 1 application topically 2 (two) times daily as needed (pain). CBD Oil   pantoprazole 40 MG tablet Commonly known as: PROTONIX Take 1 tablet (40 mg total) by mouth daily. Start taking on: July 28, 2019   polyethylene glycol 17 g packet Commonly known as: MIRALAX / GLYCOLAX Take 17 g by mouth daily. Start taking on: July 28, 2019   PROBIOTIC PO Take 1 tablet by mouth daily.   saw palmetto 160 MG capsule Take 160 mg by mouth 2 (two) times daily.   sitaGLIPtin 25 MG tablet Commonly known as: JANUVIA Take 25 mg by mouth every evening.  sotalol 80 MG tablet Commonly known as: BETAPACE Take 80 mg by mouth 2 (two) times daily.   VITAMIN C PO Take 1 tablet by mouth daily.      No follow-ups on file. Duane [name] 07/27/2019 1:56 PM

## 2019-07-27 NOTE — Progress Notes (Signed)
Report given to Barry, LPN at [facility].

## 2019-07-27 NOTE — Progress Notes (Signed)
Discharge instructions printed and reviewed with Angie, patient's daughter. Pts IV removed, catheter intact, no active bleeding. Condom cath also removed. Pt assisted into wheelchair by this RN and Caryl Pina, Therapist, sports. Pt to be transported to [facility] by Daughter via private vehicle. Pt wheeled to patient drop off area and assisted into private vehicle.

## 2019-07-27 NOTE — Discharge Summary (Signed)
Physician Discharge Summary  Duane [name] [id number] DOB: 1935/01/21 DOA: 07/23/2019  PCP: [name], MD  Admit date: 07/23/2019 Discharge date: 07/27/2019  Admitted From: ALF.  Disposition:  SNF  At [facility].   Recommendations for Outpatient Follow-up:  1. Follow up with PCP in 1-2 weeks 2. Please obtain BMP/CBC in one week 3. Please follow up WITH NEUROLOGY IN 2 TO 4 WEEKS.  4. Please follow up with cardiology as recommended.      Discharge Condition:Guarded.  CODE STATUS: full code Diet recommendation: Heart Healthy  Brief/Interim Summary: 84 year old male with prior h/o atrial fib on eliquis, OSA, cad S/P PCI, DM, Stage 3 ckd, was found on the floor obtunded, underwent intubation on 07/23/19 . He was admitted to [facility] service initially and transferred to [facility] on 9/10. He was successfully extubated on 07/24/19.  Pt seen and examined and no complaints today.   Discharge Diagnoses:  Active Problems:   Encephalopathy   Acute toxic and metabolic encephalopathy from medications and dehydration.  Resolved. He is alert and oriented to place and person. He has memory deficits at baseline.  MRI/MRA of the head and neck unrevealing.  EEG is non focal. Neurology recommended outpatient follow up in 4 to 6 weeks post discharge.  No indication for anti epileptics at this time.      Acute respiratory failure from acute encephalopathy:  Intubated for airway protection, extubated on 9/8. No respiratory issues.   Hypertensive urgency:  Currently on sotalol and losartan added today.  Prn hydralazine on board.     Dementia without any behavioral abnormalities.  Continue with aricept.    Syncope. Recent cath in July 2020, recommended medical management.  Echocardiogram showed LVef of 55 to 60% with normal diastolic parameters.  Seen and evaluated by cardiology and recommended to follow up outpatient.  Continue  with plavix and eliquis, sotalol,  losartan.    Hyperlipidemia;  Resume statin.    Diabetes mellitus:  CBG (last 3)  Recent Labs (last 2 labs)        Recent Labs    07/27/19 0401 07/27/19 0725 07/27/19 1144  GLUCAP 155* 133* 178*     Resume SSI. Resume home meds on discharge.    Chronic hyponatremia:  Sodium better this am.  He is alert and answering questions appropriately.     CAD s/p multiple PCI'S  No chest pain or sob. Continue with plavix, losartan, pravachol, sotalol.   afib with RVR.  PAF: Currently in sinus with prolonged QTc.  Resume sotalol and eliquis.  Recommend outpatient follow up with cardiology at [place].  Cardiology has stopped the aspirin due to increase risk of bleeding.    Acute on Stage 3 CKD:  Improved with hydration Creatinine at baseline today.    Deconditioning and debility: PT/OT eval recommending SNF.  Case management working on SNF.      Discharge Instructions  Discharge Instructions    Diet - low sodium heart healthy   Complete by: As directed    Discharge instructions   Complete by: As directed    Please follow up with neurology in 2 to 4 weeks.  Please follow up with cardiology in 1 to2  Weeks.  Please follow up with PCP in one week.     Allergies as of 07/27/2019      Reactions   Baclofen Other (See Comments)   Severe AMS, comatose, intubated   Bee Venom Anaphylaxis   Contrast Media [iodinated Diagnostic Agents] Anaphylaxis  Iodine-131 Anaphylaxis   Metrizamide Anaphylaxis   Metronidazole Anaphylaxis   Atorvastatin Other (See Comments)   Aching in legs   Lisinopril Cough      Medication List    STOP taking these medications   metoprolol succinate 25 MG 24 hr tablet Commonly known as: TOPROL-XL     TAKE these medications   apixaban 5 MG Tabs tablet Commonly known as: ELIQUIS Take 5 mg by mouth 2 (two) times daily.   b complex vitamins tablet Take 1 tablet by mouth daily.   CALCIUM MAGNESIUM PO Take 1  tablet by mouth daily.   calcium-vitamin D 500-200 MG-UNIT tablet Commonly known as: OSCAL WITH D Take 1 tablet by mouth daily with breakfast.   clopidogrel 75 MG tablet Commonly known as: PLAVIX Take 75 mg by mouth daily.   diclofenac sodium 1 % Gel Commonly known as: VOLTAREN Apply 2 g topically daily as needed (For muscle pain).   EpiPen 2-Pak 0.3 mg/0.3 mL Soaj injection Generic drug: EPINEPHrine Inject 0.3 mg into the muscle once as needed (severe allergic reaction).   losartan 100 MG tablet Commonly known as: COZAAR Take 100 mg by mouth daily.   memantine 5 MG tablet Commonly known as: NAMENDA Take 5 mg by mouth daily.   metFORMIN 1000 MG tablet Commonly known as: GLUCOPHAGE Take 1,000 mg by mouth 2 (two) times daily with a meal.   metoprolol tartrate 50 MG tablet Commonly known as: LOPRESSOR Take 1 tablet (50 mg total) by mouth 2 (two) times daily.   nitroGLYCERIN 0.4 MG SL tablet Commonly known as: NITROSTAT Place 0.4 mg under the tongue every 5 (five) minutes as needed for chest pain.   Omega-3-6-9 Caps Take 2 capsules by mouth 2 (two) times daily.   OVER THE COUNTER MEDICATION Apply 1 application topically 2 (two) times daily as needed (pain). CBD Oil   pantoprazole 40 MG tablet Commonly known as: PROTONIX Take 1 tablet (40 mg total) by mouth daily. Start taking on: July 28, 2019   polyethylene glycol 17 g packet Commonly known as: MIRALAX / GLYCOLAX Take 17 g by mouth daily. Start taking on: July 28, 2019   PROBIOTIC PO Take 1 tablet by mouth daily.   saw palmetto 160 MG capsule Take 160 mg by mouth 2 (two) times daily.   sitaGLIPtin 25 MG tablet Commonly known as: JANUVIA Take 25 mg by mouth every evening.   sotalol 80 MG tablet Commonly known as: BETAPACE Take 80 mg by mouth 2 (two) times daily.   VITAMIN C PO Take 1 tablet by mouth daily.       Contact information for follow-up providers    [name], MD. Schedule  an appointment as soon as possible for a visit in 1 week(s).   Specialty: Internal Medicine Contact information: [address] [place] [address] [phone]        Einar Crow, MD .   Specialty: Cardiology Contact information: [place] [address] [phone]            Contact information for after-discharge care    Destination    [facility] .   Service: Skilled Nursing Contact information: [place] [address] [phone]                 Allergies  Allergen Reactions  . Baclofen Other (See Comments)    Severe AMS, comatose, intubated  . Bee Venom Anaphylaxis  .  Contrast Media [Iodinated Diagnostic Agents] Anaphylaxis  . Iodine-131 Anaphylaxis  . Metrizamide Anaphylaxis  . Metronidazole Anaphylaxis  . Atorvastatin Other (See Comments)    Aching in legs  . Lisinopril Cough    Consultations:  Neurology  PCCM  Cardiology   Procedures/Studies: Ct Head Wo Contrast  Result Date: 07/23/2019 CLINICAL DATA:  Patient found down today.  Altered mental status. EXAM: CT HEAD WITHOUT CONTRAST TECHNIQUE: Contiguous axial images were obtained from the base of the skull through the vertex without intravenous contrast. COMPARISON:  Head CT scan and brain MRI 07/19/2019. FINDINGS: Brain: No evidence of acute infarction, hemorrhage, hydrocephalus, extra-axial collection or mass lesion/mass effect. Chronic microvascular ischemic change is again seen. Vascular: No hyperdense vessel or unexpected calcification. Skull: Intact.  No focal lesion. Sinuses/Orbits: Mild ethmoid air cell disease and minimal mucosal thickening in the frontal sinuses noted. Other: None. IMPRESSION: No acute abnormality. Chronic microvascular ischemic change. Electronically Signed   By: Inge Rise M.D.   On: 07/23/2019 13:00   Ct Cervical Spine Wo Contrast  Result Date:  07/23/2019 CLINICAL DATA:  Patient found down this morning. Altered mental status. Initial encounter. EXAM: CT CERVICAL SPINE WITHOUT CONTRAST TECHNIQUE: Multidetector CT imaging of the cervical spine was performed without intravenous contrast. Multiplanar CT image reconstructions were also generated. COMPARISON:  None. FINDINGS: Alignment: Maintained. Skull base and vertebrae: No acute fracture. No primary bone lesion or focal pathologic process. Soft tissues and spinal canal: Negative. Disc levels: Marked loss of disc space height is seen at C5-6. The C6-7 level is fused. Upper chest: Lung apices clear. Other: None. IMPRESSION: No acute abnormality. Degenerative disc disease C5-6. Electronically Signed   By: Inge Rise M.D.   On: 07/23/2019 13:02   Mr Angio Head Wo Contrast  Result Date: 07/23/2019 CLINICAL DATA:  Altered level of consciousness (LOC), unexplained Unresponsive, weakness in left arm and left leg compared to right.; Stroke, follow up EXAM: MRI HEAD WITHOUT CONTRAST MRA HEAD WITHOUT CONTRAST MRA NECK WITHOUT CONTRAST TECHNIQUE: Multiplanar, multiecho pulse sequences of the brain and surrounding structures were obtained without intravenous contrast. Angiographic images of the Circle of Willis were obtained using MRA technique without intravenous contrast. Angiographic images of the neck were obtained using MRA technique without intravenous contrast. Carotid stenosis measurements (when applicable) are obtained utilizing NASCET criteria, using the distal internal carotid diameter as the denominator. COMPARISON:  CT head without contrast 07/23/2019 in 07/19/2019. MR head without contrast 07/19/2019. Studies performed 07/19/2019 were done at [place]: MRI HEAD FINDINGS Brain: No acute infarct, hemorrhage, or mass lesion is present. Mild atrophy and moderate diffuse white matter disease is present bilaterally. There is volume loss with ex vacuo dilation of the  lateral ventricles. No significant extraaxial fluid collection is present. Brainstem and cerebellum are within normal limits. Vascular: Flow is present in the major intracranial arteries. Skull and upper cervical spine: The craniocervical junction is normal. Upper cervical spine is within normal limits. Marrow signal is unremarkable. Sinuses/Orbits: Scattered ethmoid opacification is similar the prior study. Mild mucosal thickening is present in the frontal sinuses bilaterally. The paranasal sinuses and mastoid air cells are otherwise clear. Bilateral lens replacements are noted. Globes and orbits are otherwise unremarkable. MRA HEAD FINDINGS The internal carotid arteries are within normal limits from the high cervical segments through the ICA termini bilaterally. The A1 and M1 segments are normal. The anterior communicating artery is present. ACA and MCA branch vessels are within normal limits. MRA NECK FINDINGS  Time-of-flight images demonstrate normal flow in the carotid arteries bilaterally. There is no significant flow disturbance at either carotid bifurcation. Flow is antegrade in the vertebral arteries bilaterally. The left vertebral artery is dominant. IMPRESSION: 1. No acute intracranial abnormality. 2. Stable diffuse white matter disease, moderately advanced for age. 3. Normal MRA circle-of-Willis without significant proximal stenosis, aneurysm, or branch vessel occlusion. 4. Normal MRA of the neck without contrast. No significant stenosis. Electronically Signed   By: San Morelle M.D.   On: 07/23/2019 17:31   Mr Angio Neck Wo Contrast  Result Date: 07/23/2019 CLINICAL DATA:  Altered level of consciousness (LOC), unexplained Unresponsive, weakness in left arm and left leg compared to right.; Stroke, follow up EXAM: MRI HEAD WITHOUT CONTRAST MRA HEAD WITHOUT CONTRAST MRA NECK WITHOUT CONTRAST TECHNIQUE: Multiplanar, multiecho pulse sequences of the brain and surrounding structures were obtained  without intravenous contrast. Angiographic images of the Circle of Willis were obtained using MRA technique without intravenous contrast. Angiographic images of the neck were obtained using MRA technique without intravenous contrast. Carotid stenosis measurements (when applicable) are obtained utilizing NASCET criteria, using the distal internal carotid diameter as the denominator. COMPARISON:  CT head without contrast 07/23/2019 in 07/19/2019. MR head without contrast 07/19/2019. Studies performed 07/19/2019 were done at [place]: MRI HEAD FINDINGS Brain: No acute infarct, hemorrhage, or mass lesion is present. Mild atrophy and moderate diffuse white matter disease is present bilaterally. There is volume loss with ex vacuo dilation of the lateral ventricles. No significant extraaxial fluid collection is present. Brainstem and cerebellum are within normal limits. Vascular: Flow is present in the major intracranial arteries. Skull and upper cervical spine: The craniocervical junction is normal. Upper cervical spine is within normal limits. Marrow signal is unremarkable. Sinuses/Orbits: Scattered ethmoid opacification is similar the prior study. Mild mucosal thickening is present in the frontal sinuses bilaterally. The paranasal sinuses and mastoid air cells are otherwise clear. Bilateral lens replacements are noted. Globes and orbits are otherwise unremarkable. MRA HEAD FINDINGS The internal carotid arteries are within normal limits from the high cervical segments through the ICA termini bilaterally. The A1 and M1 segments are normal. The anterior communicating artery is present. ACA and MCA branch vessels are within normal limits. MRA NECK FINDINGS Time-of-flight images demonstrate normal flow in the carotid arteries bilaterally. There is no significant flow disturbance at either carotid bifurcation. Flow is antegrade in the vertebral arteries bilaterally. The left vertebral  artery is dominant. IMPRESSION: 1. No acute intracranial abnormality. 2. Stable diffuse white matter disease, moderately advanced for age. 3. Normal MRA circle-of-Willis without significant proximal stenosis, aneurysm, or branch vessel occlusion. 4. Normal MRA of the neck without contrast. No significant stenosis. Electronically Signed   By: San Morelle M.D.   On: 07/23/2019 17:31   Mr Brain Wo Contrast  Result Date: 07/23/2019 CLINICAL DATA:  Altered level of consciousness (LOC), unexplained Unresponsive, weakness in left arm and left leg compared to right.; Stroke, follow up EXAM: MRI HEAD WITHOUT CONTRAST MRA HEAD WITHOUT CONTRAST MRA NECK WITHOUT CONTRAST TECHNIQUE: Multiplanar, multiecho pulse sequences of the brain and surrounding structures were obtained without intravenous contrast. Angiographic images of the Circle of Willis were obtained using MRA technique without intravenous contrast. Angiographic images of the neck were obtained using MRA technique without intravenous contrast. Carotid stenosis measurements (when applicable) are obtained utilizing NASCET criteria, using the distal internal carotid diameter as the denominator. COMPARISON:  CT head without contrast 07/23/2019 in 07/19/2019. MR  head without contrast 07/19/2019. Studies performed 07/19/2019 were done at [place]: MRI HEAD FINDINGS Brain: No acute infarct, hemorrhage, or mass lesion is present. Mild atrophy and moderate diffuse white matter disease is present bilaterally. There is volume loss with ex vacuo dilation of the lateral ventricles. No significant extraaxial fluid collection is present. Brainstem and cerebellum are within normal limits. Vascular: Flow is present in the major intracranial arteries. Skull and upper cervical spine: The craniocervical junction is normal. Upper cervical spine is within normal limits. Marrow signal is unremarkable. Sinuses/Orbits: Scattered ethmoid  opacification is similar the prior study. Mild mucosal thickening is present in the frontal sinuses bilaterally. The paranasal sinuses and mastoid air cells are otherwise clear. Bilateral lens replacements are noted. Globes and orbits are otherwise unremarkable. MRA HEAD FINDINGS The internal carotid arteries are within normal limits from the high cervical segments through the ICA termini bilaterally. The A1 and M1 segments are normal. The anterior communicating artery is present. ACA and MCA branch vessels are within normal limits. MRA NECK FINDINGS Time-of-flight images demonstrate normal flow in the carotid arteries bilaterally. There is no significant flow disturbance at either carotid bifurcation. Flow is antegrade in the vertebral arteries bilaterally. The left vertebral artery is dominant. IMPRESSION: 1. No acute intracranial abnormality. 2. Stable diffuse white matter disease, moderately advanced for age. 3. Normal MRA circle-of-Willis without significant proximal stenosis, aneurysm, or branch vessel occlusion. 4. Normal MRA of the neck without contrast. No significant stenosis. Electronically Signed   By: San Morelle M.D.   On: 07/23/2019 17:31   Dg Chest Portable 1 View  Result Date: 07/23/2019 CLINICAL DATA:  Post intubation Second image to confirm OG tube placement EXAM: PORTABLE CHEST 1 VIEW COMPARISON:  Chest radiograph 07/23/2019 at 12:53 p.m. FINDINGS: Status post placement of an endotracheal tube with tip terminating between the thoracic inlet and carina. Interval placement of a nasogastric tube with side port projecting the at or just beyond the GE junction. Stable cardiomediastinal contours. Low lung volumes without new focal infiltrate. No pneumothorax or large pleural effusion. Nonobstructive bowel gas pattern. IMPRESSION: 1. Satisfactory placement of the endotracheal tube terminating between the thoracic inlet and carina. 2. Interval placement of nasogastric tube with side port  projecting at or just beyond the GE junction. Recommend short advancement into the stomach. Electronically Signed   By: Audie Pinto M.D.   On: 07/23/2019 14:20   Dg Chest Portable 1 View  Result Date: 07/23/2019 CLINICAL DATA:  Altered Mental Status, ? Stroke, Sleep Apnea. Tech wore mask, face shield, and gloves. Patient wore a mask. EXAM: PORTABLE CHEST 1 VIEW COMPARISON:  Chest radiograph 07/19/2019, 05/31/2019 FINDINGS: Stable cardiomediastinal contours. Minimal opacity at the left base likely atelectasis or scarring. No focal infiltrate. No pneumothorax or large pleural effusion. No acute finding in the visualized skeleton. IMPRESSION: Minimal left basilar atelectasis or scarring. No other acute finding. Electronically Signed   By: Audie Pinto M.D.   On: 07/23/2019 13:18       Subjective: No new complaints. Wants to go home.   Discharge Exam: Vitals:   07/27/19 1119 07/27/19 1200  BP: (!) 175/83   Pulse:    Resp:    Temp:  97.7 F (36.5 C)  SpO2:     Vitals:   07/27/19 0936 07/27/19 1000 07/27/19 1119 07/27/19 1200  BP: (!) 175/85 (!) 139/112 (!) 175/83   Pulse: 90 87    Resp:  17    Temp:  97.7 F (36.5 C)  TempSrc:    Oral  SpO2:  97%    Weight:      Height:        General: Pt is alert, awake, not in acute distress Cardiovascular: RRR, S1/S2 +, no rubs, no gallops Respiratory: CTA bilaterally, no wheezing, no rhonchi Abdominal: Soft, NT, ND, bowel sounds + Extremities: no edema, no cyanosis    The results of significant diagnostics from this hospitalization (including imaging, microbiology, ancillary and laboratory) are listed below for reference.     Microbiology: Recent Results (from the past 240 hour(s))  Blood culture (routine x 2)     Status: None (Preliminary result)   Collection Time: 07/23/19 12:08 PM   Specimen: BLOOD  Result Value Ref Range Status   Specimen Description BLOOD SITE NOT SPECIFIED  Final   Special Requests   Final     BOTTLES DRAWN AEROBIC AND ANAEROBIC Blood Culture results may not be optimal due to an excessive volume of blood received in culture bottles   Culture   Final    NO GROWTH 4 DAYS Performed at [facility], [place] [address], [place], [place] [address]    Report Status PENDING  Incomplete  SARS Coronavirus 2 [facility] order, Performed in [facility] hospital lab) Nasopharyngeal Nasopharyngeal Swab     Status: None   Collection Time: 07/23/19 12:22 PM   Specimen: Nasopharyngeal Swab  Result Value Ref Range Status   SARS Coronavirus 2 NEGATIVE NEGATIVE Final    Comment: (NOTE) If result is NEGATIVE SARS-CoV-2 target nucleic acids are NOT DETECTED. The SARS-CoV-2 RNA is generally detectable in upper and lower  respiratory specimens during the acute phase of infection. The lowest  concentration of SARS-CoV-2 viral copies this assay can detect is 250  copies / mL. A negative result does not preclude SARS-CoV-2 infection  and should not be used as the sole basis for treatment or other  patient management decisions.  A negative result may occur with  improper specimen collection / handling, submission of specimen other  than nasopharyngeal swab, presence of viral mutation(s) within the  areas targeted by this assay, and inadequate number of viral copies  (<250 copies / mL). A negative result must be combined with clinical  observations, patient history, and epidemiological information. If result is POSITIVE SARS-CoV-2 target nucleic acids are DETECTED. The SARS-CoV-2 RNA is generally detectable in upper and lower  respiratory specimens dur ing the acute phase of infection.  Positive  results are indicative of active infection with SARS-CoV-2.  Clinical  correlation with patient history and other diagnostic information is  necessary to determine patient infection status.  Positive results do  not rule out bacterial infection or co-infection with other viruses. If result is PRESUMPTIVE  POSTIVE SARS-CoV-2 nucleic acids MAY BE PRESENT.   A presumptive positive result was obtained on the submitted specimen  and confirmed on repeat testing.  While 2019 novel coronavirus  (SARS-CoV-2) nucleic acids may be present in the submitted sample  additional confirmatory testing may be necessary for epidemiological  and / or clinical management purposes  to differentiate between  SARS-CoV-2 and other Sarbecovirus currently known to infect humans.  If clinically indicated additional testing with an alternate test  methodology [phone]) is advised. The SARS-CoV-2 RNA is generally  detectable in upper and lower respiratory sp ecimens during the acute  phase of infection. The expected result is Negative. Fact Sheet for Patients:  StrictlyIdeas.no Fact Sheet for Healthcare Providers: BankingDealers.co.za This  test is not yet approved or cleared by the [place] and has been authorized for detection and/or diagnosis of SARS-CoV-2 by FDA under an Emergency Use Authorization (EUA).  This EUA will remain in effect (meaning this test can be used) for the duration of the COVID-19 declaration under Section 564(b)(1) of the Act, 21 U.S.C. section 360bbb-3(b)(1), unless the authorization is terminated or revoked sooner. Performed at [facility], [place] [address] [place], [place] [address]   Urine culture     Status: None   Collection Time: 07/23/19 12:24 PM   Specimen: Urine, Random  Result Value Ref Range Status   Specimen Description URINE, RANDOM  Final   Special Requests NONE  Final   Culture   Final    NO GROWTH Performed at [facility], [place] [address] [place], [place] [address]    Report Status 07/24/2019 FINAL  Final  Blood culture (routine x 2)     Status: None (Preliminary result)   Collection Time: 07/23/19  1:26 PM   Specimen: BLOOD LEFT FOREARM  Result Value Ref Range Status   Specimen Description BLOOD  LEFT FOREARM  Final   Special Requests   Final    BOTTLES DRAWN AEROBIC AND ANAEROBIC Blood Culture results may not be optimal due to an inadequate volume of blood received in culture bottles   Culture   Final    NO GROWTH 4 DAYS Performed at [facility], [place] [address], [place], [place] [address]    Report Status PENDING  Incomplete  MRSA PCR Screening     Status: None   Collection Time: 07/23/19  5:30 PM   Specimen: Nasal Mucosa; Nasopharyngeal  Result Value Ref Range Status   MRSA by PCR NEGATIVE NEGATIVE Final    Comment:        The GeneXpert MRSA Assay (FDA approved for NASAL specimens only), is one component of a comprehensive MRSA colonization surveillance program. It is not intended to diagnose MRSA infection nor to guide or monitor treatment for MRSA infections. Performed at [facility], [place] [address], [place], [place] [address]      Labs: BNP (last 3 results) No results for input(s): BNP in the last 8760 hours. Basic Metabolic Panel: Recent Labs  Lab 07/23/19 1447 07/23/19 1840 07/24/19 0438 07/25/19 0519 07/26/19 0752 07/27/19 0447  NA  --  129* 129* 128* 128* 134*  K  --  4.2 3.3* 3.5 3.8 3.8  CL  --  93* 94* 94* 93* 98  CO2  --  25 23 21* 24 24  GLUCOSE  --  174* 122* 160* 149* 160*  BUN  --  21 16 10 11 14   CREATININE 1.24 1.27* 1.04 1.12 1.09 1.10  CALCIUM  --  9.1 8.7* 8.6* 8.4* 9.0  MG 1.9  --   --  1.8  --   --    Liver Function Tests: Recent Labs  Lab 07/23/19 1207  AST 50*  ALT 22  ALKPHOS 62  BILITOT 1.2  PROT 7.5  ALBUMIN 4.1   No results for input(s): LIPASE, AMYLASE in the last 168 hours. Recent Labs  Lab 07/23/19 1840  AMMONIA 11   CBC: Recent Labs  Lab 07/23/19 1207 07/23/19 1404 07/24/19 0438 07/24/19 1144 07/25/19 0316 07/26/19 0338  WBC 11.6*  --  14.0*  --  10.8* 9.2  NEUTROABS 9.1*  --   --   --   --   --  HGB 13.2 12.9* 12.4* 11.9* 11.0* 11.1*  HCT 38.8* 38.0* 35.7* 35.0* 32.9* 32.9*  MCV  82.9  --  81.5  --  83.5 83.9  PLT 235  --  190  --  204 217   Cardiac Enzymes: Recent Labs  Lab 07/23/19 1447  CKTOTAL 1,617*   BNP: Invalid input(s): POCBNP CBG: Recent Labs  Lab 07/26/19 1922 07/26/19 2348 07/27/19 0401 07/27/19 0725 07/27/19 1144  GLUCAP 229* 93 155* 133* 178*   D-Dimer No results for input(s): DDIMER in the last 72 hours. Hgb A1c No results for input(s): HGBA1C in the last 72 hours. Lipid Profile No results for input(s): CHOL, HDL, LDLCALC, TRIG, CHOLHDL, LDLDIRECT in the last 72 hours. Thyroid function studies No results for input(s): TSH, T4TOTAL, T3FREE, THYROIDAB in the last 72 hours.  Invalid input(s): FREET3 Anemia work up No results for input(s): VITAMINB12, FOLATE, FERRITIN, TIBC, IRON, RETICCTPCT in the last 72 hours. Urinalysis    Component Value Date/Time   COLORURINE YELLOW 07/23/2019 1223   APPEARANCEUR CLEAR 07/23/2019 1223   LABSPEC 1.015 07/23/2019 1223   PHURINE 6.0 07/23/2019 1223   GLUCOSEU 150 (A) 07/23/2019 1223   HGBUR NEGATIVE 07/23/2019 1223   BILIRUBINUR NEGATIVE 07/23/2019 1223   KETONESUR 20 (A) 07/23/2019 1223   PROTEINUR 30 (A) 07/23/2019 1223   NITRITE NEGATIVE 07/23/2019 1223   LEUKOCYTESUR NEGATIVE 07/23/2019 1223   Sepsis Labs Invalid input(s): PROCALCITONIN,  WBC,  LACTICIDVEN Microbiology Recent Results (from the past 240 hour(s))  Blood culture (routine x 2)     Status: None (Preliminary result)   Collection Time: 07/23/19 12:08 PM   Specimen: BLOOD  Result Value Ref Range Status   Specimen Description BLOOD SITE NOT SPECIFIED  Final   Special Requests   Final    BOTTLES DRAWN AEROBIC AND ANAEROBIC Blood Culture results may not be optimal due to an excessive volume of blood received in culture bottles   Culture   Final    NO GROWTH 4 DAYS Performed at [facility], [place] [address], [place], [place] [address]    Report Status PENDING  Incomplete  SARS Coronavirus 2 [facility] order, Performed  in [facility] hospital lab) Nasopharyngeal Nasopharyngeal Swab     Status: None   Collection Time: 07/23/19 12:22 PM   Specimen: Nasopharyngeal Swab  Result Value Ref Range Status   SARS Coronavirus 2 NEGATIVE NEGATIVE Final    Comment: (NOTE) If result is NEGATIVE SARS-CoV-2 target nucleic acids are NOT DETECTED. The SARS-CoV-2 RNA is generally detectable in upper and lower  respiratory specimens during the acute phase of infection. The lowest  concentration of SARS-CoV-2 viral copies this assay can detect is 250  copies / mL. A negative result does not preclude SARS-CoV-2 infection  and should not be used as the sole basis for treatment or other  patient management decisions.  A negative result may occur with  improper specimen collection / handling, submission of specimen other  than nasopharyngeal swab, presence of viral mutation(s) within the  areas targeted by this assay, and inadequate number of viral copies  (<250 copies / mL). A negative result must be combined with clinical  observations, patient history, and epidemiological information. If result is POSITIVE SARS-CoV-2 target nucleic acids are DETECTED. The SARS-CoV-2 RNA is generally detectable in upper and lower  respiratory specimens dur ing the acute phase of infection.  Positive  results are indicative of active infection with SARS-CoV-2.  Clinical  correlation with patient history and other  diagnostic information is  necessary to determine patient infection status.  Positive results do  not rule out bacterial infection or co-infection with other viruses. If result is PRESUMPTIVE POSTIVE SARS-CoV-2 nucleic acids MAY BE PRESENT.   A presumptive positive result was obtained on the submitted specimen  and confirmed on repeat testing.  While 2019 novel coronavirus  (SARS-CoV-2) nucleic acids may be present in the submitted sample  additional confirmatory testing may be necessary for epidemiological  and / or clinical  management purposes  to differentiate between  SARS-CoV-2 and other Sarbecovirus currently known to infect humans.  If clinically indicated additional testing with an alternate test  methodology [phone]) is advised. The SARS-CoV-2 RNA is generally  detectable in upper and lower respiratory sp ecimens during the acute  phase of infection. The expected result is Negative. Fact Sheet for Patients:  StrictlyIdeas.no Fact Sheet for Healthcare Providers: BankingDealers.co.za This test is not yet approved or cleared by the [place] FDA and has been authorized for detection and/or diagnosis of SARS-CoV-2 by FDA under an Emergency Use Authorization (EUA).  This EUA will remain in effect (meaning this test can be used) for the duration of the COVID-19 declaration under Section 564(b)(1) of the Act, 21 U.S.C. section 360bbb-3(b)(1), unless the authorization is terminated or revoked sooner. Performed at [facility], [place] [address], [place], [place] [address]   Urine culture     Status: None   Collection Time: 07/23/19 12:24 PM   Specimen: Urine, Random  Result Value Ref Range Status   Specimen Description URINE, RANDOM  Final   Special Requests NONE  Final   Culture   Final    NO GROWTH Performed at [facility], [place] [address], [place], [place] [address]    Report Status 07/24/2019 FINAL  Final  Blood culture (routine x 2)     Status: None (Preliminary result)   Collection Time: 07/23/19  1:26 PM   Specimen: BLOOD LEFT FOREARM  Result Value Ref Range Status   Specimen Description BLOOD LEFT FOREARM  Final   Special Requests   Final    BOTTLES DRAWN AEROBIC AND ANAEROBIC Blood Culture results may not be optimal due to an inadequate volume of blood received in culture bottles   Culture   Final    NO GROWTH 4 DAYS Performed at [facility], [place] [address] [place], [place] [address]    Report Status PENDING   Incomplete  MRSA PCR Screening     Status: None   Collection Time: 07/23/19  5:30 PM   Specimen: Nasal Mucosa; Nasopharyngeal  Result Value Ref Range Status   MRSA by PCR NEGATIVE NEGATIVE Final    Comment:        The GeneXpert MRSA Assay (FDA approved for NASAL specimens only), is one component of a comprehensive MRSA colonization surveillance program. It is not intended to diagnose MRSA infection nor to guide or monitor treatment for MRSA infections. Performed at [facility], [place] [address] [place], [place] [address]      Time coordinating discharge: 32 minutes  SIGNED:   Hosie Poisson, MD  [facility] 07/27/2019, 1:32 PM Pager   If 7PM-7AM, please contact night-coverage [email or web address] Password [email or web address]

## 2019-07-28 LAB — CULTURE, BLOOD (ROUTINE X 2)
Culture: NO GROWTH
Culture: NO GROWTH

## 2019-08-15 ENCOUNTER — Ambulatory Visit: Payer: Medicare Other | Admitting: Orthopaedic Surgery

## 2019-08-20 ENCOUNTER — Encounter: Payer: Self-pay | Admitting: Orthopaedic Surgery

## 2019-08-20 ENCOUNTER — Ambulatory Visit: Payer: Self-pay

## 2019-08-20 ENCOUNTER — Ambulatory Visit (INDEPENDENT_AMBULATORY_CARE_PROVIDER_SITE_OTHER): Payer: Medicare Other | Admitting: Orthopaedic Surgery

## 2019-08-20 DIAGNOSIS — M7061 Trochanteric bursitis, right hip: Secondary | ICD-10-CM

## 2019-08-20 DIAGNOSIS — M25551 Pain in right hip: Secondary | ICD-10-CM

## 2019-08-20 MED ORDER — LIDOCAINE HCL 1 % IJ SOLN
3.0000 mL | INTRAMUSCULAR | Status: AC | PRN
  Administered 2019-08-20: 3 mL

## 2019-08-20 MED ORDER — METHYLPREDNISOLONE ACETATE 40 MG/ML IJ SUSP
40.0000 mg | INTRAMUSCULAR | Status: AC | PRN
  Administered 2019-08-20: 16:00:00 40 mg via INTRA_ARTICULAR

## 2019-08-20 NOTE — Progress Notes (Signed)
Office Visit Note   Patient: Duane [name]           Date of Birth: [birth year]           MRN: [id number] Visit Date: 08/20/2019              Requested by: [name], [place],  [place] [address] PCP: [name], MD   Assessment & Plan: Visit Diagnoses:  1. Pain in right hip   2. Trochanteric bursitis, right hip     Plan: I do feel it is worthwhile to try a trochanteric injection with a steroid and lidocaine mixture.  He did tolerate this well but was still having pain afterwards.  I would like to see him back in just 1 week follow-up to see how he is doing overall.  If he still having some amount of discomfort we may end up obtaining an MRI to rule out an insufficiency fracture around the hip or the pelvis.  All questions concerns were answered and addressed.  We will see him back in 1 week.  Follow-Up Instructions: Return in about 1 week (around 08/27/2019).   Orders:  Orders Placed This Encounter  Procedures  . Large Joint Inj  . XR HIP UNILAT W OR W/O PELVIS 1V RIGHT   No orders of the defined types were placed in this encounter.     Procedures: Large Joint Inj: R greater trochanter on 08/20/2019 3:30 PM Indications: pain and diagnostic evaluation Details: 22 G 1.5 in needle, lateral approach  Arthrogram: No  Medications: 3 mL lidocaine 1 %; 40 mg methylPREDNISolone acetate 40 MG/ML Outcome: tolerated well, no immediate complications Procedure, treatment alternatives, risks and benefits explained, specific risks discussed. Consent was given by the patient. Immediately prior to procedure a time out was called to verify the correct patient, procedure, equipment, support staff and site/side marked as required. Patient was prepped and draped in the usual sterile fashion.       Clinical Data: No additional findings.   Subjective: Chief Complaint  Patient presents with  . Right Hip - Pain  The patient is a very pleasant  84 year old gentleman I am seeing for the first time.  I am seeing him with his son present who is a physician who I know well.  There is been little bit of issues with some dementia.  He had been ambulating well about 6 months ago and then he has since had a decline in his condition in terms of having had some falls.  He is dealing with degenerative changes in his lumbar spine but is also been favoring his right hip.  He is gotten to where he is not mobilizing much at all.  He can walk straight in line with assistance to therapy but pivoting activities cause a lot of pain around the lateral aspect of his right hip.  They have worked try to get him out of the skilled nursing section of the facility that he stays in and back to his assisted or independent living but is been difficult to do so due to his deconditioning and the pain that he is been having.  He is a significant fall risk.  I talked with his son in detail about things.  He is also diabetic.  This is been frustrating for the patient for sure based on his inability to mobilize as well as he had in the past when he is to walk long distances.  HPI  Review  of Systems He currently denies any headache, chest pain, shortness of breath, fever, chills, nausea, vomiting  Objective: Vital Signs: There were no vitals taken for this visit.  Physical Exam He is alert and oriented in no acute distress but obvious discomfort and deconditioning. Ortho Exam Examination of his left hip is normal examination of his right hip does show some pain when I stressed the hip itself but mainly on the lateral aspect over the trochanteric area.  He is tender over the trochanteric area and IT band.  I can compress the groin with the hip and that this does not cause significant pain. Specialty Comments:  No specialty comments available.  Imaging: Xr Hip Unilat W Or W/o Pelvis 1v Right  Result Date: 08/20/2019 An AP pelvis and lateral right hip shows no acute  findings.  There is no significant arthritis in either hip.  There is no cortical irregularity around the femoral neck or the trochanteric region to suggest a fracture.    PMFS History: Patient Active Problem List   Diagnosis Date Noted  . Encephalopathy 07/23/2019  . Respiratory failure with hypoxia ([facility]) 09/08/2016  . Abdominal pain, right upper quadrant 09/08/2016  . Essential hypertension 09/08/2016  . PAF (paroxysmal atrial fibrillation) ([facility]) 09/08/2016  . Diabetes mellitus type 2, controlled ([facility]) 09/08/2016  . ARF (acute renal failure) ([facility]) 09/08/2016  . Normochromic normocytic anemia 09/08/2016  . Abdominal pain 09/08/2016   Past Medical History:  Diagnosis Date  . A-fib ([facility])   . Anemia   . Angina pectoris ([facility])   . Arthritis   . Carpal tunnel syndrome   . Cobalamin deficiency   . Coronary artery disease   . Diabetes mellitus without complication ([facility])   . Eczema   . Hyperlipemia   . Hypertension   . Kidney disease, chronic, stage III (moderate, EGFR 30-59 ml/min) ([facility])   . Lung infiltrate   . Myocardial infarct, old   . Prostatic hypertrophy   . Sleep apnea, obstructive   . Squamous cell cancer of lip     Family History  Problem Relation Age of Onset  . Hypertension Other     Past Surgical History:  Procedure Laterality Date  . CORONARY ANGIOPLASTY WITH STENT PLACEMENT     Social History   Occupational History  . Not on file  Tobacco Use  . Smoking status: Never Smoker  . Smokeless tobacco: Never Used  Substance and Sexual Activity  . Alcohol use: No  . Drug use: Not on file  . Sexual activity: Not on file

## 2019-08-28 ENCOUNTER — Other Ambulatory Visit: Payer: Self-pay

## 2019-08-28 ENCOUNTER — Ambulatory Visit (INDEPENDENT_AMBULATORY_CARE_PROVIDER_SITE_OTHER): Payer: Medicare Other | Admitting: Orthopaedic Surgery

## 2019-08-28 ENCOUNTER — Encounter: Payer: Self-pay | Admitting: Orthopaedic Surgery

## 2019-08-28 DIAGNOSIS — S72001G Fracture of unspecified part of neck of right femur, subsequent encounter for closed fracture with delayed healing: Secondary | ICD-10-CM

## 2019-08-28 DIAGNOSIS — M25551 Pain in right hip: Secondary | ICD-10-CM | POA: Diagnosis not present

## 2019-08-28 DIAGNOSIS — M4807 Spinal stenosis, lumbosacral region: Secondary | ICD-10-CM

## 2019-08-28 DIAGNOSIS — M5441 Lumbago with sciatica, right side: Secondary | ICD-10-CM

## 2019-08-28 NOTE — Progress Notes (Signed)
The patient is a 84 year old gentleman I saw a week ago with his son who is a Restaurant manager, fast food.  Another son is with him today.  He has been staying at [facility] facility due to difficulty ambulating and significant deconditioning.  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  Plain films in the office of his pelvis and right hip are unremarkable.  I did provide a steroid injection over the trochanteric area that did help for about 1 to 2 days.  He still has significant deconditioning and weakness in his legs.  He has neuropathy.  He also has significant issues with weakness and trying to ambulate.  This is all been acute just this year.  He has had an MRI of his brain did not show any aneurysm and it showed diffuse white matter changes but otherwise no acute findings.  On exam he still has a lot of pain around his sciatic area in the right side of his pelvis.  I can put his hip through range of motion and there is some pain but no pain when compressing the hip and is hard to tell where the pain is coming from.  He does have weakness with his quads and hamstrings and in his foot on the right side.  At this point I am still concerned about his acute deconditioning and inability to ambulate with the severe pain that he is having which he says now is all the time.  We need to obtain an MRI of the lumbar spine to rule out any nerve compression is causing issues for his right lower extremity and his profound weakness over a short rapid period time.  We also need an MRI of the right hip to rule out fracture.  I do feel that both of these are medically necessary given his acute decline in his condition.  All question concerns were answered addressed.  We will see him back once we have these MRIs.

## 2019-09-05 ENCOUNTER — Emergency Department (HOSPITAL_COMMUNITY): Payer: Medicare Other

## 2019-09-05 ENCOUNTER — Other Ambulatory Visit: Payer: Self-pay

## 2019-09-05 ENCOUNTER — Inpatient Hospital Stay (HOSPITAL_COMMUNITY)
Admission: EM | Admit: 2019-09-05 | Discharge: 2019-09-07 | DRG: 683 | Disposition: A | Discharge status: Skilled Nursing Facility | Payer: Medicare Other | Source: Skilled Nursing Facility | Attending: Internal Medicine | Admitting: Internal Medicine

## 2019-09-05 ENCOUNTER — Encounter (HOSPITAL_COMMUNITY): Payer: Self-pay | Admitting: Emergency Medicine

## 2019-09-05 DIAGNOSIS — N390 Urinary tract infection, site not specified: Secondary | ICD-10-CM

## 2019-09-05 DIAGNOSIS — R531 Weakness: Secondary | ICD-10-CM | POA: Diagnosis present

## 2019-09-05 DIAGNOSIS — N183 Chronic kidney disease, stage 3 unspecified: Secondary | ICD-10-CM | POA: Diagnosis present

## 2019-09-05 DIAGNOSIS — Z91030 Bee allergy status: Secondary | ICD-10-CM

## 2019-09-05 DIAGNOSIS — I129 Hypertensive chronic kidney disease with stage 1 through stage 4 chronic kidney disease, or unspecified chronic kidney disease: Secondary | ICD-10-CM | POA: Diagnosis not present

## 2019-09-05 DIAGNOSIS — N179 Acute kidney failure, unspecified: Secondary | ICD-10-CM | POA: Diagnosis not present

## 2019-09-05 DIAGNOSIS — E1142 Type 2 diabetes mellitus with diabetic polyneuropathy: Secondary | ICD-10-CM | POA: Diagnosis present

## 2019-09-05 DIAGNOSIS — Z7902 Long term (current) use of antithrombotics/antiplatelets: Secondary | ICD-10-CM

## 2019-09-05 DIAGNOSIS — Z955 Presence of coronary angioplasty implant and graft: Secondary | ICD-10-CM

## 2019-09-05 DIAGNOSIS — Z9181 History of falling: Secondary | ICD-10-CM

## 2019-09-05 DIAGNOSIS — E871 Hypo-osmolality and hyponatremia: Secondary | ICD-10-CM | POA: Diagnosis not present

## 2019-09-05 DIAGNOSIS — E861 Hypovolemia: Secondary | ICD-10-CM | POA: Diagnosis not present

## 2019-09-05 DIAGNOSIS — N3001 Acute cystitis with hematuria: Secondary | ICD-10-CM | POA: Diagnosis present

## 2019-09-05 DIAGNOSIS — Z7901 Long term (current) use of anticoagulants: Secondary | ICD-10-CM

## 2019-09-05 DIAGNOSIS — I4891 Unspecified atrial fibrillation: Secondary | ICD-10-CM | POA: Diagnosis present

## 2019-09-05 DIAGNOSIS — B961 Klebsiella pneumoniae [K. pneumoniae] as the cause of diseases classified elsewhere: Secondary | ICD-10-CM | POA: Diagnosis not present

## 2019-09-05 DIAGNOSIS — R609 Edema, unspecified: Secondary | ICD-10-CM

## 2019-09-05 DIAGNOSIS — R159 Full incontinence of feces: Secondary | ICD-10-CM | POA: Diagnosis present

## 2019-09-05 DIAGNOSIS — G8929 Other chronic pain: Secondary | ICD-10-CM | POA: Diagnosis not present

## 2019-09-05 DIAGNOSIS — Z91041 Radiographic dye allergy status: Secondary | ICD-10-CM

## 2019-09-05 DIAGNOSIS — R197 Diarrhea, unspecified: Secondary | ICD-10-CM | POA: Diagnosis not present

## 2019-09-05 DIAGNOSIS — Z20828 Contact with and (suspected) exposure to other viral communicable diseases: Secondary | ICD-10-CM | POA: Diagnosis present

## 2019-09-05 DIAGNOSIS — F0390 Unspecified dementia without behavioral disturbance: Secondary | ICD-10-CM | POA: Diagnosis present

## 2019-09-05 DIAGNOSIS — Z79899 Other long term (current) drug therapy: Secondary | ICD-10-CM

## 2019-09-05 DIAGNOSIS — M7989 Other specified soft tissue disorders: Secondary | ICD-10-CM | POA: Diagnosis not present

## 2019-09-05 DIAGNOSIS — E860 Dehydration: Secondary | ICD-10-CM | POA: Diagnosis not present

## 2019-09-05 DIAGNOSIS — E1122 Type 2 diabetes mellitus with diabetic chronic kidney disease: Secondary | ICD-10-CM | POA: Diagnosis present

## 2019-09-05 DIAGNOSIS — I252 Old myocardial infarction: Secondary | ICD-10-CM

## 2019-09-05 DIAGNOSIS — G4733 Obstructive sleep apnea (adult) (pediatric): Secondary | ICD-10-CM | POA: Diagnosis present

## 2019-09-05 DIAGNOSIS — Z85819 Personal history of malignant neoplasm of unspecified site of lip, oral cavity, and pharynx: Secondary | ICD-10-CM | POA: Diagnosis not present

## 2019-09-05 DIAGNOSIS — Z888 Allergy status to other drugs, medicaments and biological substances status: Secondary | ICD-10-CM

## 2019-09-05 DIAGNOSIS — I2510 Atherosclerotic heart disease of native coronary artery without angina pectoris: Secondary | ICD-10-CM | POA: Diagnosis present

## 2019-09-05 DIAGNOSIS — N400 Enlarged prostate without lower urinary tract symptoms: Secondary | ICD-10-CM | POA: Diagnosis present

## 2019-09-05 DIAGNOSIS — Z7984 Long term (current) use of oral hypoglycemic drugs: Secondary | ICD-10-CM

## 2019-09-05 DIAGNOSIS — Z8249 Family history of ischemic heart disease and other diseases of the circulatory system: Secondary | ICD-10-CM

## 2019-09-05 DIAGNOSIS — E785 Hyperlipidemia, unspecified: Secondary | ICD-10-CM | POA: Diagnosis present

## 2019-09-05 DIAGNOSIS — M25551 Pain in right hip: Secondary | ICD-10-CM | POA: Diagnosis not present

## 2019-09-05 LAB — CBC WITH DIFFERENTIAL/PLATELET
Abs Immature Granulocytes: 0.03 10*3/uL (ref 0.00–0.07)
Basophils Absolute: 0.1 10*3/uL (ref 0.0–0.1)
Basophils Relative: 1 %
Eosinophils Absolute: 0.4 10*3/uL (ref 0.0–0.5)
Eosinophils Relative: 4 %
HCT: 33.7 % — ABNORMAL LOW (ref 39.0–52.0)
Hemoglobin: 11.3 g/dL — ABNORMAL LOW (ref 13.0–17.0)
Immature Granulocytes: 0 %
Lymphocytes Relative: 29 %
Lymphs Abs: 2.7 10*3/uL (ref 0.7–4.0)
MCH: 28.5 pg (ref 26.0–34.0)
MCHC: 33.5 g/dL (ref 30.0–36.0)
MCV: 85.1 fL (ref 80.0–100.0)
Monocytes Absolute: 1 10*3/uL (ref 0.1–1.0)
Monocytes Relative: 11 %
Neutro Abs: 5.2 10*3/uL (ref 1.7–7.7)
Neutrophils Relative %: 55 %
Platelets: 224 10*3/uL (ref 150–400)
RBC: 3.96 MIL/uL — ABNORMAL LOW (ref 4.22–5.81)
RDW: 15.1 % (ref 11.5–15.5)
WBC: 9.2 10*3/uL (ref 4.0–10.5)
nRBC: 0 % (ref 0.0–0.2)

## 2019-09-05 LAB — BASIC METABOLIC PANEL
Anion gap: 13 (ref 5–15)
BUN: 14 mg/dL (ref 8–23)
CO2: 21 mmol/L — ABNORMAL LOW (ref 22–32)
Calcium: 8.5 mg/dL — ABNORMAL LOW (ref 8.9–10.3)
Chloride: 93 mmol/L — ABNORMAL LOW (ref 98–111)
Creatinine, Ser: 1.12 mg/dL (ref 0.61–1.24)
GFR calc Af Amer: >60 mL/min (ref >60)
GFR calc non Af Amer: >60 mL/min (ref >60)
Glucose, Bld: 143 mg/dL — ABNORMAL HIGH (ref 70–99)
Potassium: 3.6 mmol/L (ref 3.5–5.1)
Sodium: 127 mmol/L — ABNORMAL LOW (ref 135–145)

## 2019-09-05 LAB — URINALYSIS, ROUTINE W REFLEX MICROSCOPIC
Bilirubin Urine: NEGATIVE
Glucose, UA: NEGATIVE mg/dL
Hgb urine dipstick: NEGATIVE
Ketones, ur: NEGATIVE mg/dL
Nitrite: POSITIVE — AB
Protein, ur: NEGATIVE mg/dL
Specific Gravity, Urine: 1.01 (ref 1.005–1.030)
WBC, UA: >50 WBC/hpf — ABNORMAL HIGH (ref 0–5)
pH: 6 (ref 5.0–8.0)

## 2019-09-05 LAB — COMPREHENSIVE METABOLIC PANEL
ALT: 13 U/L (ref 0–44)
AST: 16 U/L (ref 15–41)
Albumin: 3.5 g/dL (ref 3.5–5.0)
Alkaline Phosphatase: 57 U/L (ref 38–126)
Anion gap: 9 (ref 5–15)
BUN: 17 mg/dL (ref 8–23)
CO2: 21 mmol/L — ABNORMAL LOW (ref 22–32)
Calcium: 8.7 mg/dL — ABNORMAL LOW (ref 8.9–10.3)
Chloride: 90 mmol/L — ABNORMAL LOW (ref 98–111)
Creatinine, Ser: 1.31 mg/dL — ABNORMAL HIGH (ref 0.61–1.24)
GFR calc Af Amer: 58 mL/min — ABNORMAL LOW (ref >60)
GFR calc non Af Amer: 50 mL/min — ABNORMAL LOW (ref >60)
Glucose, Bld: 124 mg/dL — ABNORMAL HIGH (ref 70–99)
Potassium: 4 mmol/L (ref 3.5–5.1)
Sodium: 120 mmol/L — ABNORMAL LOW (ref 135–145)
Total Bilirubin: 1 mg/dL (ref 0.3–1.2)
Total Protein: 6.2 g/dL — ABNORMAL LOW (ref 6.5–8.1)

## 2019-09-05 LAB — CBG MONITORING, ED
Glucose-Capillary: 117 mg/dL — ABNORMAL HIGH (ref 70–99)
Glucose-Capillary: 145 mg/dL — ABNORMAL HIGH (ref 70–99)

## 2019-09-05 LAB — LIPASE, BLOOD: Lipase: 39 U/L (ref 11–51)

## 2019-09-05 LAB — SARS CORONAVIRUS 2 (TAT 6-24 HRS): SARS Coronavirus 2: NEGATIVE

## 2019-09-05 IMAGING — DX DG CHEST 1V PORT
1 series · 1 of 1 positions shown · non-contrast
Comparison: Portable exam [SZ] hours compared to [DATE]

CLINICAL DATA: Diarrhea for 1 month intermittently worse for last 3
days, weakness, history coronary artery disease, diabetes mellitus,
hypertension

EXAM:
PORTABLE CHEST 1 VIEW

[chest ap]
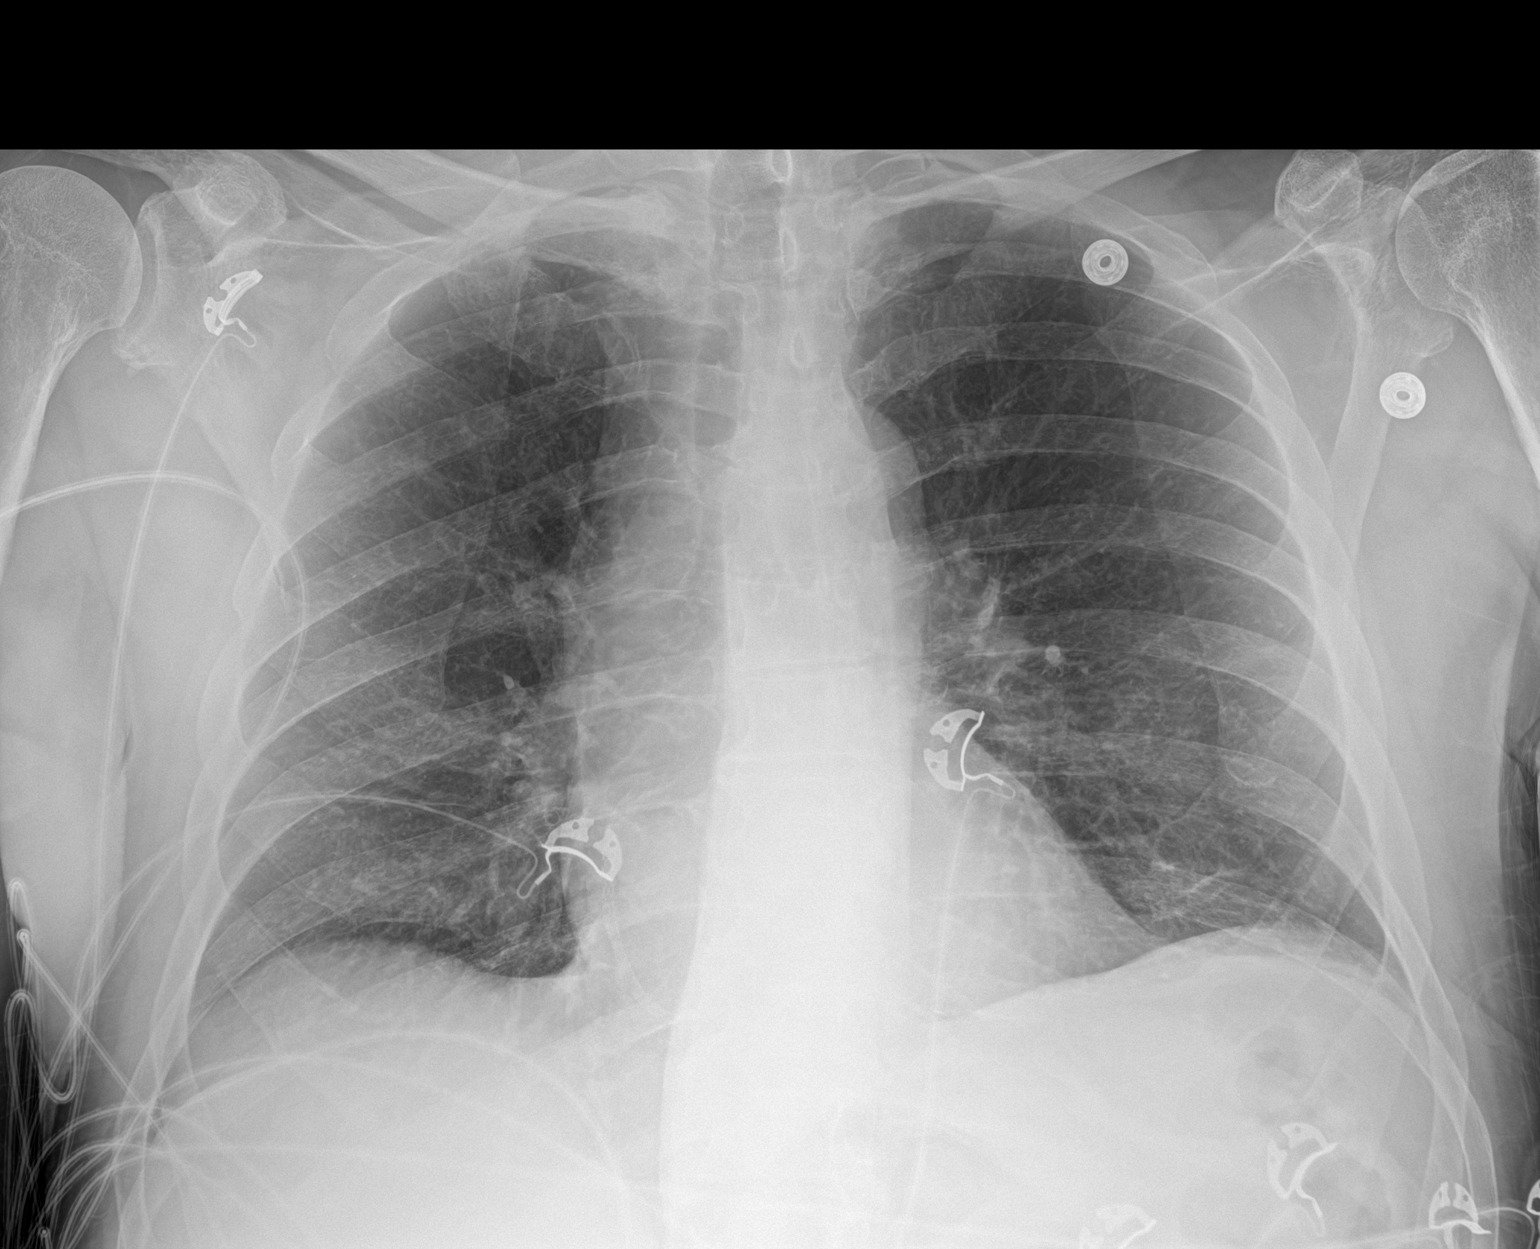

[1 of 1 positions shown; findings below may reference images not displayed]

FINDINGS: Normal heart size, mediastinal contours, and pulmonary vascularity.

Lungs clear.

No infiltrate, pleural effusion or pneumothorax.

Bones demineralized.
IMPRESSION: No acute abnormalities.

## 2019-09-05 IMAGING — DX DG HIP (WITH OR WITHOUT PELVIS) 2-3V*R*
3 series · 3 of 3 positions shown · non-contrast
Comparison: Hip radiographs dated [DATE]

CLINICAL DATA: Pain in leg swelling

EXAM:
DG HIP (WITH OR WITHOUT PELVIS) 2-3V RIGHT

[pelvis ap]
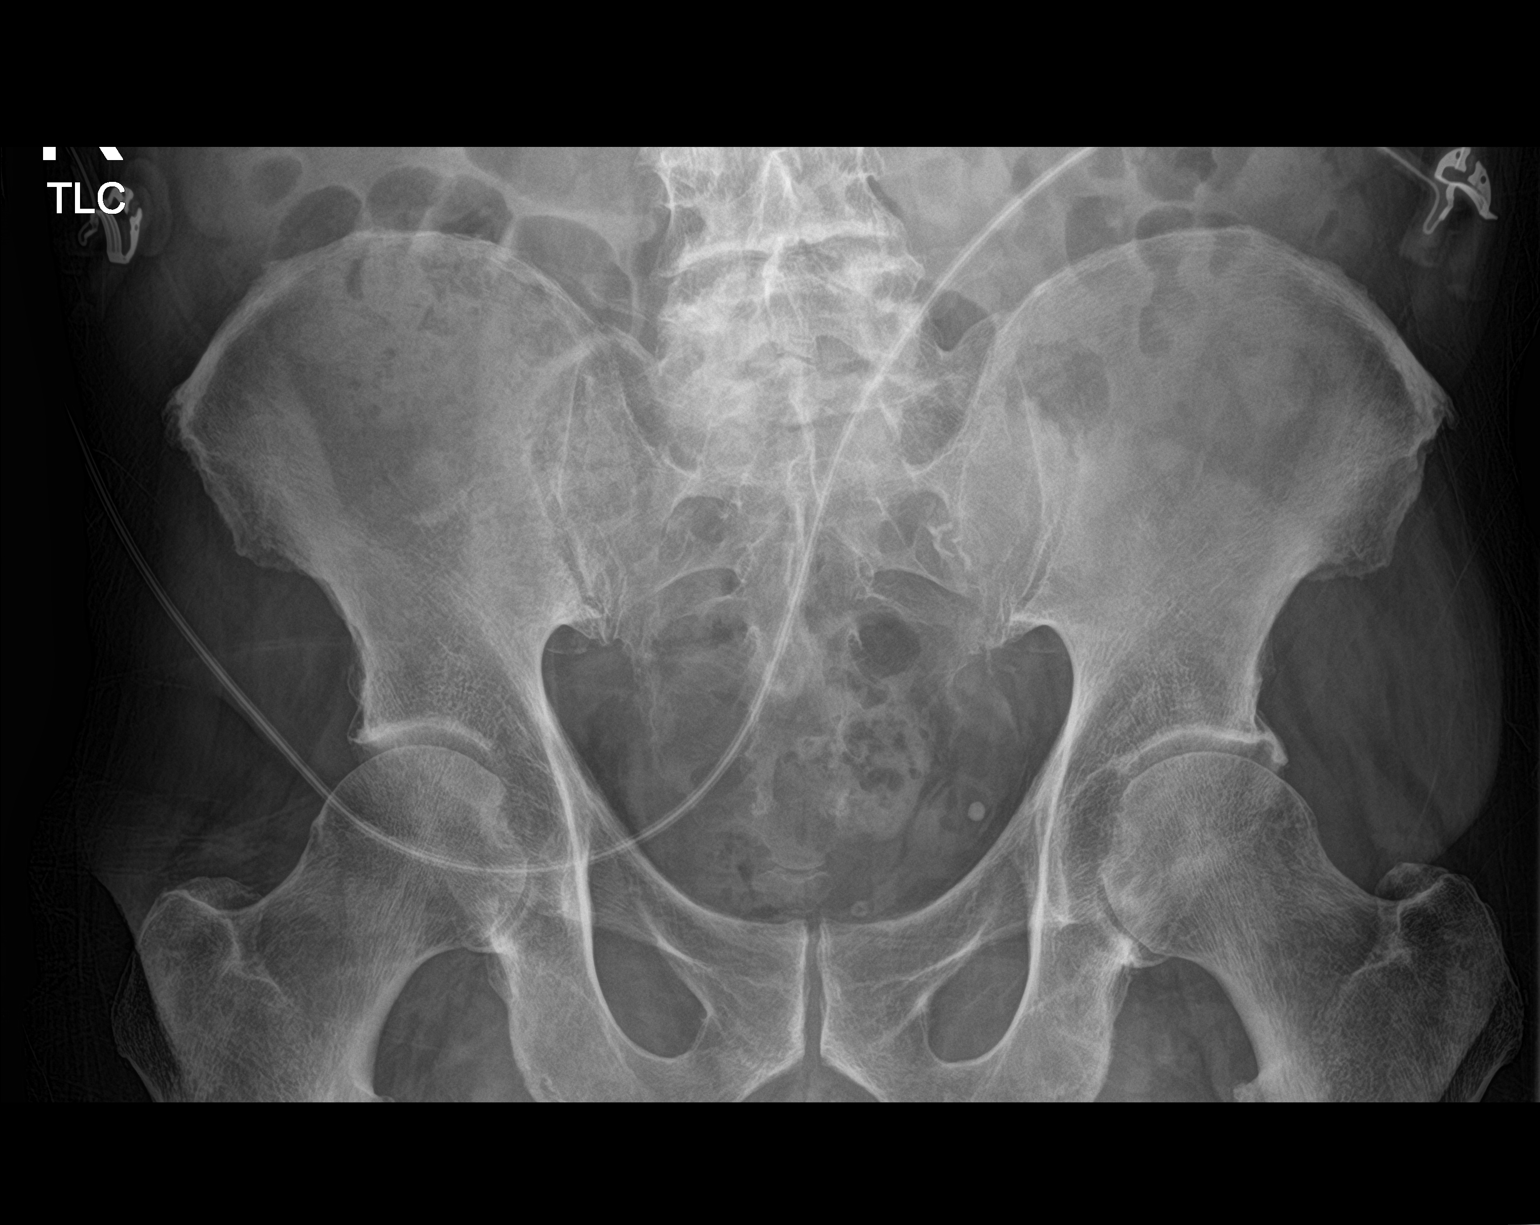

[hip ap]
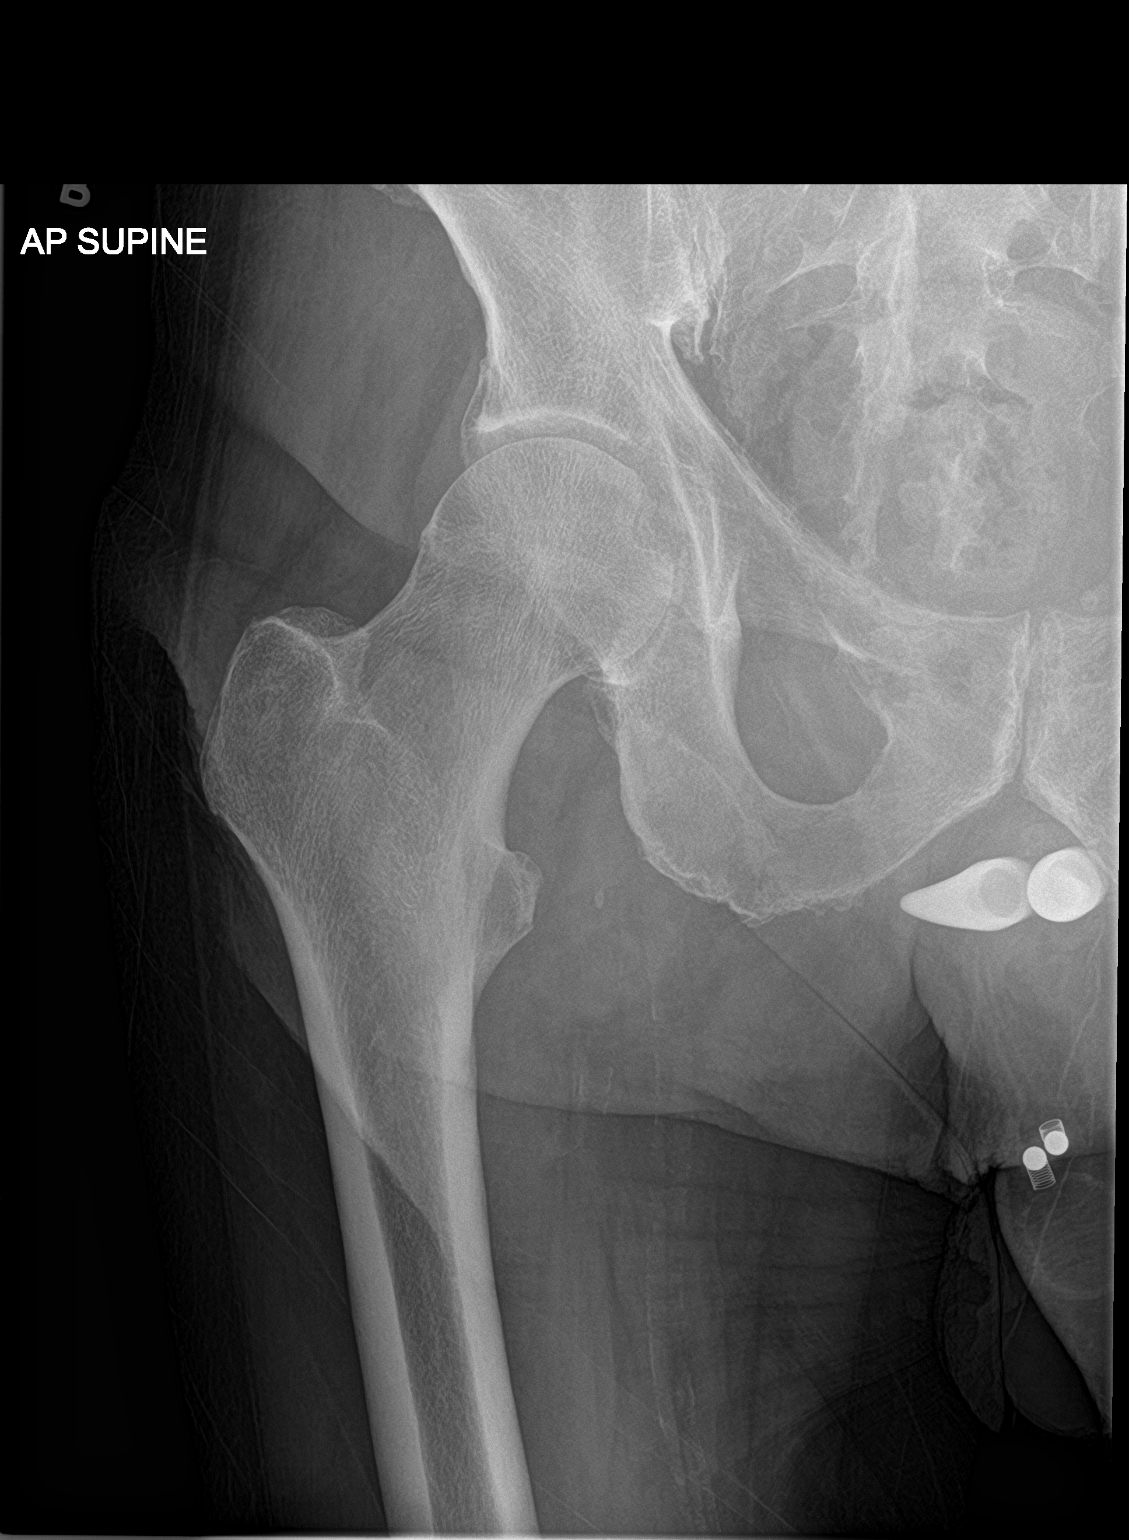

[hip lat]
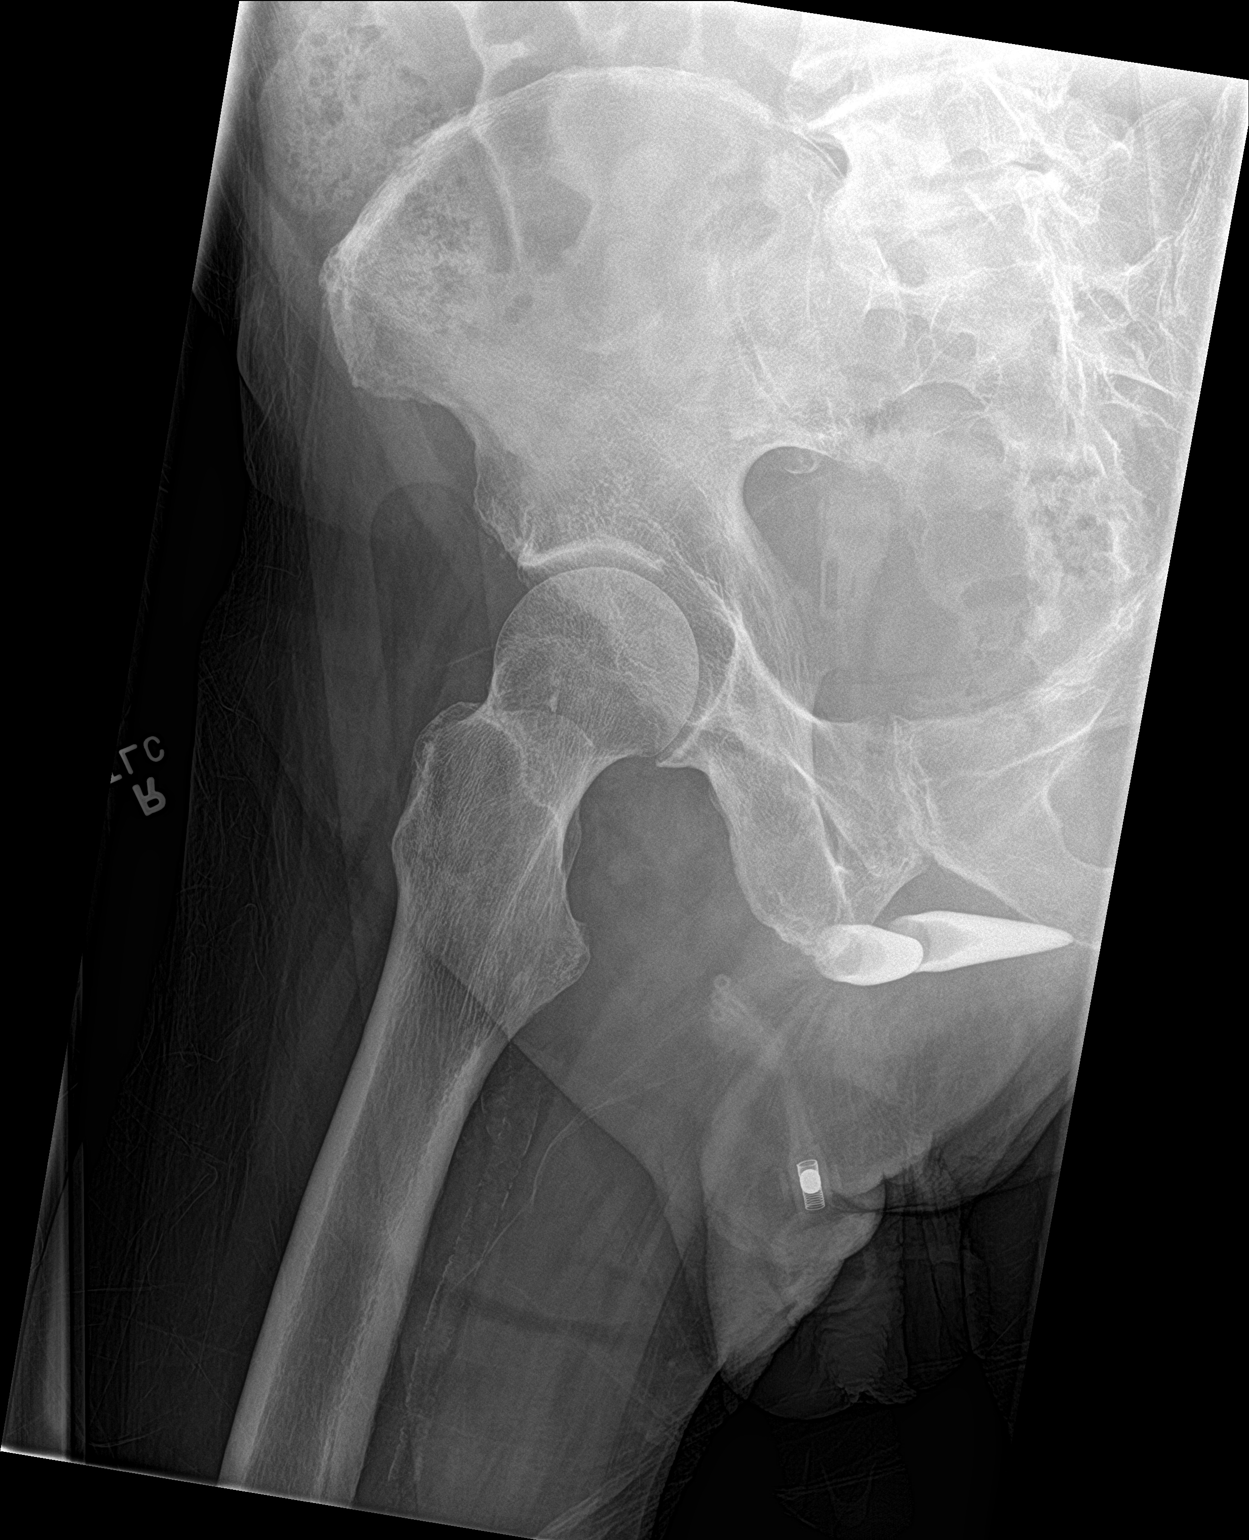

[3 of 3 positions shown; findings below may reference images not displayed]

FINDINGS: There is no evidence of hip fracture or dislocation. Mild
degenerative changes are seen in the hips. Moderate degenerative
changes are seen in the lumbar spine. A penile implant is
redemonstrated.
IMPRESSION: Mild degenerative changes in the hips. No evidence of hip fracture
or dislocation.

## 2019-09-05 MED ORDER — SODIUM CHLORIDE 0.9 % IV BOLUS
500.0000 mL | Freq: Once | INTRAVENOUS | Status: AC
  Administered 2019-09-05: 17:00:00 500 mL via INTRAVENOUS

## 2019-09-05 MED ORDER — ACETAMINOPHEN 325 MG PO TABS
650.0000 mg | ORAL_TABLET | Freq: Four times a day (QID) | ORAL | Status: DC | PRN
  Administered 2019-09-05: 650 mg via ORAL
  Filled 2019-09-05: qty 2

## 2019-09-05 MED ORDER — METOPROLOL TARTRATE 50 MG PO TABS
50.0000 mg | ORAL_TABLET | Freq: Two times a day (BID) | ORAL | Status: DC
  Administered 2019-09-06 – 2019-09-07 (×3): 50 mg via ORAL
  Filled 2019-09-05 (×2): qty 1
  Filled 2019-09-05: qty 2
  Filled 2019-09-05: qty 1

## 2019-09-05 MED ORDER — SOTALOL HCL 80 MG PO TABS: 80.0000 mg | ORAL_TABLET | Freq: Two times a day (BID) | ORAL | Status: DC

## 2019-09-05 MED ORDER — CLOPIDOGREL BISULFATE 75 MG PO TABS
75.0000 mg | ORAL_TABLET | Freq: Every day | ORAL | Status: DC
  Administered 2019-09-06 – 2019-09-07 (×2): 75 mg via ORAL
  Filled 2019-09-05 (×3): qty 1

## 2019-09-05 MED ORDER — SODIUM CHLORIDE 0.9 % IV SOLN
1.0000 g | Freq: Once | INTRAVENOUS | Status: AC
  Administered 2019-09-05: 1 g via INTRAVENOUS
  Filled 2019-09-05: qty 10

## 2019-09-05 MED ORDER — MEMANTINE HCL 5 MG PO TABS
5.0000 mg | ORAL_TABLET | Freq: Every day | ORAL | Status: DC
  Administered 2019-09-06 – 2019-09-07 (×2): 5 mg via ORAL
  Filled 2019-09-05 (×2): qty 1

## 2019-09-05 MED ORDER — ACETAMINOPHEN 650 MG RE SUPP: 650.0000 mg | Freq: Four times a day (QID) | RECTAL | Status: DC | PRN

## 2019-09-05 MED ORDER — RISAQUAD PO CAPS
1.0000 | ORAL_CAPSULE | Freq: Every day | ORAL | Status: DC
  Administered 2019-09-06 – 2019-09-07 (×2): 1 via ORAL
  Filled 2019-09-05 (×3): qty 1

## 2019-09-05 MED ORDER — PANTOPRAZOLE SODIUM 40 MG PO TBEC
40.0000 mg | DELAYED_RELEASE_TABLET | Freq: Every day | ORAL | Status: DC
  Administered 2019-09-06 – 2019-09-07 (×2): 40 mg via ORAL
  Filled 2019-09-05 (×3): qty 1

## 2019-09-05 MED ORDER — INSULIN ASPART 100 UNIT/ML ~~LOC~~ SOLN: 0.0000 [IU] | Freq: Every day | SUBCUTANEOUS | Status: DC

## 2019-09-05 MED ORDER — APIXABAN 5 MG PO TABS
5.0000 mg | ORAL_TABLET | Freq: Two times a day (BID) | ORAL | Status: DC
  Administered 2019-09-06 – 2019-09-07 (×4): 5 mg via ORAL
  Filled 2019-09-05 (×4): qty 1

## 2019-09-05 MED ORDER — SODIUM CHLORIDE 0.9 % IV SOLN
INTRAVENOUS | Status: DC
  Administered 2019-09-05: 23:00:00 via INTRAVENOUS

## 2019-09-05 MED ORDER — METOPROLOL TARTRATE 25 MG PO TABS: 50.0000 mg | ORAL_TABLET | Freq: Two times a day (BID) | ORAL | Status: DC

## 2019-09-05 MED ORDER — SODIUM CHLORIDE 0.45 % IV SOLN
INTRAVENOUS | Status: DC
  Administered 2019-09-06: via INTRAVENOUS

## 2019-09-05 MED ORDER — SODIUM CHLORIDE 0.9 % IV SOLN
1.0000 g | INTRAVENOUS | Status: DC
  Administered 2019-09-06 – 2019-09-07 (×2): 1 g via INTRAVENOUS
  Filled 2019-09-05 (×2): qty 1

## 2019-09-05 MED ORDER — LINAGLIPTIN 5 MG PO TABS: 5.0000 mg | ORAL_TABLET | Freq: Every day | ORAL | Status: DC

## 2019-09-05 MED ORDER — SOTALOL HCL 80 MG PO TABS
80.0000 mg | ORAL_TABLET | Freq: Two times a day (BID) | ORAL | Status: DC
  Administered 2019-09-06 – 2019-09-07 (×3): 80 mg via ORAL
  Filled 2019-09-05 (×4): qty 1

## 2019-09-05 MED ORDER — CALCIUM CARBONATE-VITAMIN D 500-200 MG-UNIT PO TABS
1.0000 | ORAL_TABLET | Freq: Every day | ORAL | Status: DC
  Administered 2019-09-06 – 2019-09-07 (×2): 1 via ORAL
  Filled 2019-09-05 (×3): qty 1

## 2019-09-05 MED ORDER — INSULIN ASPART 100 UNIT/ML ~~LOC~~ SOLN
0.0000 [IU] | Freq: Three times a day (TID) | SUBCUTANEOUS | Status: DC
  Administered 2019-09-06: 1 [IU] via SUBCUTANEOUS
  Administered 2019-09-06: 3 [IU] via SUBCUTANEOUS
  Administered 2019-09-06: 1 [IU] via SUBCUTANEOUS
  Administered 2019-09-07: 2 [IU] via SUBCUTANEOUS
  Administered 2019-09-07: 1 [IU] via SUBCUTANEOUS

## 2019-09-05 NOTE — ED Provider Notes (Signed)
  Face-to-face evaluation   History: He presents for evaluation of diarrhea, ongoing for months.  He states he has a history of "low sodium.  He also complains of right hip pain.  Physical exam: Alert elderly man.  He is able to stand up with assistance.  I help to urinate.  Back nontender to palpation.    He is mildly confused.  Medical screening examination/treatment/procedure(s) were conducted as a shared visit with non-physician practitioner(s) and myself.  I personally evaluated the patient during the encounter    Daleen Bo, MD 09/08/19 [id number]

## 2019-09-05 NOTE — ED Triage Notes (Signed)
Pt from [facility] facility, reports intermittent diarrhea x 1 month, states it has been worse the last 3 days.  Facility tried to cath pt to get urine today due to him not having any output for 8 hours and did not get any urine return, pt was able to void with EMS upon arrival to ED. Pt denies any urinary symptoms, no pain anywhere, no complaints. Pt is A&O x 4 with some mild confusion about the month. Family reported that pt was independent until 3 months ago and now has declined physically and cognitively.

## 2019-09-05 NOTE — ED Notes (Signed)
Pt aware a stool sample is needed, will provide when able.

## 2019-09-05 NOTE — H&P (Addendum)
Date: 09/05/2019               Patient Name:  Duane [name] MRN: [id number]  DOB: 13-Mar-1935 Age / Sex: 84 y.o., male   PCP: [name], MD         Medical Service: Internal Medicine Teaching Service         Attending Physician: Dr. Velna Ochs, MD    First Contact: Darrick Meigs, MD, Rylee Pager: [place] [phone])  Second Contact: Sharon Seller DO, Jaimie PagerJari Pigg [phone])       After Hours (After 5p/  First Contact Pager: [phone]  weekends / holidays): Second Contact Pager: [phone]   Chief Complaint: diarrhea and fatigue  History of Present Illness: 84 y.o. yo male w/ PMHx of hypertension, CAD s/p PCI, diabetes mellitus with peripheral neuropathy, CKD III, hyperlipidemia, dementia and A.fib on Eliqus presenting from [facility] for progressively worsening fatigue. History is obtained by patient, son and nursing facility. Patient has been gradually declining in mentation and physical strength over the past three months. Patient reports one month of diarrhea that has been worsening over the past 3-4 days. He has had greater than five watery, nonbloody stools per day with episodes of fecal incontinence. He denies any abdominal pain, nausea/vomiting, or melena. He believes that his diarrhea is from taking a stool softener at the nursing facility. However, his RN reports that he has not received any stool softener or antibiotics in the past 10 days. He does report a change in diet from eating cereal to bacon and eggs every morning; however, unsure of the timeline of the diet change in relation to the onset of symptoms.   He has also had increasing generalized fatigue over the past several days and reports that he has been sleeping more yet still feels exhausted. He does report decreased PO intake and balance issues when trying to walk. He uses a power chair and rolling walker to ambulate. According to the nursing facility, patient has history of falls and  his last fall was on 10/19 without any significant injury. This morning, patient was had decreased urinary output and nursing was unable to draw blood for 8 hours. Patient was encouraged to drink fluids without any significant improvement. Nursing also noted bilateral feet swelling at the time. He was sent to the ED for further evaluation. Patient denies any fevers, chills, chest pain, headache, lightheadedness/dizziness, vision changes, shortness of breath or recent sick contacts.   Of note, patient had recent hospitalization in September when he was found down in his independent living apartment for acute toxic and metabolic encephalopathy requiring intubation for acute respiratory failure. He was discharged to [facility]. Since discharge, he has followed up with Dr. Ninfa Linden at [facility] for evaluation of right hip pain. Given the acuity of his deconditioning and inability to ambulate secondary to his right hip pain, plans for obtaining MRI lumbar spine and right hip to r/o nerve compression and fracture.   ED course:  Patient presented for evaluation of diarrhea, generalized weakness and dehydration in setting of decreased urinary output earlier today at his ALF. Lab work significant for hyponatremia and UA with consistent with UTI. Patient given rocephin. Patient admitted to internal medicine for further evaluation and management.    Meds:  No outpatient medications have been marked as taking for the 09/05/19 encounter [facility] Encounter).  Eliquis 93m bid  Calcium-magnesium-vitamin D 1 tablet qd Plavix 768mqd Voltaren gel 2g qd prn Losartan 10071md  Memantine 67m qd Metformin 10038mbid  Metoprolol 5035mid Nitroglycerin 0.4mg55mmin100mn Pantoprazole 40mg 59manuvia 25mg q49mtalol 80mg bi28mAllergies: Allergies as of 09/05/2019 - Review Complete 09/05/2019  Allergen Reaction Noted   Baclofen Other (See Comments) 07/25/2019   Bee venom Anaphylaxis 08/16/2012   Contrast  media [iodinated diagnostic agents] Anaphylaxis 09/08/2016   Iodine-131 Anaphylaxis 08/16/2012   Metrizamide Anaphylaxis 09/08/2016   Metronidazole Anaphylaxis 07/23/2019   Atorvastatin Other (See Comments) 08/16/2012   Lisinopril Cough 08/16/2012   Past Medical History:  Past Medical History:  Diagnosis Date   A-fib (HCC)    Porterdalemia    Angina pectoris (HCC)    Arthritis    Carpal tunnel syndrome    Cobalamin deficiency    Coronary artery disease    Diabetes mellitus without complication (HCC)    Eczema    Hyperlipemia    Hypertension    Kidney disease, chronic, stage III (moderate, EGFR 30-59 ml/min)    Lung infiltrate    Myocardial infarct, old    Prostatic hypertrophy    Sleep apnea, obstructive    Squamous cell cancer of lip     Family History:  Family History  Problem Relation Age of Onset   Hypertension Other      Social History:  Social History   Tobacco Use   Smoking status: Never Smoker   Smokeless tobacco: Never Used  Substance Use Topics   Alcohol use: No   Drug use: Not on file     Review of Systems: A complete ROS was negative except as per HPI.   Physical Exam: Blood pressure (!) 144/70, pulse 67, temperature 98.6 F (37 C), temperature source Oral, resp. rate 13, SpO2 98 %. Physical Exam Vitals signs reviewed.  Constitutional:      General: He is not in acute distress.    Appearance: Normal appearance. He is not toxic-appearing or diaphoretic.  HENT:     Head: Normocephalic and atraumatic.     Mouth/Throat:     Mouth: Mucous membranes are dry.     Pharynx: Oropharynx is clear. No oropharyngeal exudate or posterior oropharyngeal erythema.  Eyes:     General: No scleral icterus.    Extraocular Movements: Extraocular movements intact.     Conjunctiva/sclera: Conjunctivae normal.  Neck:     Musculoskeletal: Normal range of motion and neck supple.  Cardiovascular:     Rate and Rhythm: Normal rate and regular  rhythm.     Pulses: Normal pulses.     Heart sounds: Normal heart sounds. No murmur. No friction rub. No gallop.   Pulmonary:     Effort: Pulmonary effort is normal. No respiratory distress.     Breath sounds: Normal breath sounds. No wheezing or rales.  Abdominal:     General: Bowel sounds are normal. There is no distension.     Palpations: Abdomen is soft. There is no mass.     Tenderness: There is abdominal tenderness. There is no guarding.     Comments: Tenderness to palpation of the suprapubic region  Musculoskeletal: Normal range of motion.        General: No swelling, tenderness, deformity or signs of injury.  Skin:    General: Skin is warm and dry.     Capillary Refill: Capillary refill takes less than 2 seconds.     Findings: No bruising, lesion or rash.  Neurological:     Mental Status: He is alert. Mental status is at baseline.  Motor: Weakness present.     Comments: Patient is oriented to self, place, time and situation. However, has obvious memory deficits.  Decreased strength in the RLE, chronic No focal sensory deficits noted on exam    CBC    Component Value Date/Time   WBC 9.2 09/05/2019 1635   RBC 3.96 (L) 09/05/2019 1635   HGB 11.3 (L) 09/05/2019 1635   HCT 33.7 (L) 09/05/2019 1635   PLT 224 09/05/2019 1635   MCV 85.1 09/05/2019 1635   MCH 28.5 09/05/2019 1635   MCHC 33.5 09/05/2019 1635   RDW 15.1 09/05/2019 1635   LYMPHSABS 2.7 09/05/2019 1635   MONOABS 1.0 09/05/2019 1635   EOSABS 0.4 09/05/2019 1635   BASOSABS 0.1 09/05/2019 1635   CMP     Component Value Date/Time   NA 127 (L) 09/05/2019 2157   K 3.6 09/05/2019 2157   CL 93 (L) 09/05/2019 2157   CO2 21 (L) 09/05/2019 2157   GLUCOSE 143 (H) 09/05/2019 2157   BUN 14 09/05/2019 2157   CREATININE 1.12 09/05/2019 2157   CALCIUM 8.5 (L) 09/05/2019 2157   PROT 6.2 (L) 09/05/2019 1635   ALBUMIN 3.5 09/05/2019 1635   AST 16 09/05/2019 1635   ALT 13 09/05/2019 1635   ALKPHOS 57 09/05/2019 1635    BILITOT 1.0 09/05/2019 1635   GFRNONAA >60 09/05/2019 2157   GFRAA >60 09/05/2019 2157   Urinalysis    Component Value Date/Time   COLORURINE YELLOW 09/05/2019 1635   APPEARANCEUR CLOUDY (A) 09/05/2019 1635   LABSPEC 1.010 09/05/2019 1635   PHURINE 6.0 09/05/2019 1635   GLUCOSEU NEGATIVE 09/05/2019 1635   [place] 09/05/2019 1635   [place] 09/05/2019 1635   [place] 09/05/2019 1635   PROTEINUR NEGATIVE 09/05/2019 1635   NITRITE POSITIVE (A) 09/05/2019 1635   LEUKOCYTESUR LARGE (A) 09/05/2019 1635    EKG: personally reviewed my interpretation is normal sinus rhythm with QTc 460.   CXR: personally reviewed my interpretation is no acute cardiopulmonary abnormalities. No cardiomegaly noted.   RIGHT HIP X-RAY: IMPRESSION: Mild degenerative changes in the hips. No evidence of hip fracture or dislocation.  Assessment & Plan by Problem:  Patient is a very pleasant 84yo male with PMHx of hypertension, A.fib on Eliquis, DMIII, CKD3, CAD s/p PCI, and dementia presenting with worsening diarrhea for 3-4 days, fatigue, and dehydration with decreased urinary output for one day duration. Patient's UA consistent with UTI and labs remarkable for hyponatremia and AKI.   Acute diarrhea:  Patient with 3-4 days of worsening watery nonbloody diarrhea of unclear etiology. No history of recent antibiotic or laxative use. However, patient was hospitalized recently and discharged on 9/11. No significant dietary changes. Patient is hemodynamically stable, afebrile without any leukocytosis on labs. On exam, patient appears to be volume down.  - GI pathogen panel  - C.diff testing - IVF - Enteric precautions   Hypovolemic hyponatremia:  Na 120 on admission. He has history of hyponatremia on prior admissions however has ranged 128-132. Suspect this is secondary to dehydration as patient has been experiencing GI losses for possibly one month duration. Patient has been  experiencing generalized weakness.  - IVF - BMP q4h  - Goal Na 126-128 by 3pm - Urine studies and serum osm  UTI:  Patient with decreased urine output likely secondary to dehydration; however no other urinary symptoms. Patient does not have history of BPH. Afebrile without leukocytosis. Patient has some suprapubic tenderness on examination. UA positive for leukocytes and nitrites.  Patient given Rocephin in the ED. He remains asymptomatic.  - Continue rocephin - f/u urine culture - Bladder scan q4h - f/u CBC  AKI on CKDIII:  Pt with history of CKDII baseline Cr 1.0-1.1 with BUN/Cr 17/1.31 on admission. Likely prerenal in setting of GI losses and also noted to have decreased urinary output. Patient appears hypovolemic on examination - IVF - f/u BMP - f/u urine studies as above  Right hip pain:  Patient has right hip pain which appears to be an issue over the past year but acutely worsened over the past 3 months. He has been evaluated by orthopedics recently and was given steroid injection on 10/5 without any significant relief in symptoms. He continues to have deconditioning and weakness in legs in setting of peripheral neuropathy secondary to DM. Patient was scheduled for MRI lumbar spine and right hip to r/o nerve compression and hip fracture; however not performed yet. Right hip x-ray today without any acute changes. On exam, patient continues to have right hip pain and right leg weakness which seems chronic in nature.  - PT/OT evaluation - pain control  - Consider MRI lumbar spine/hip if symptoms worsen   Diabetes mellitus with peripheral neuropathy:  Patient has history of DM complicated by peripheral neuropathy. HbA1c 6.3 in September 2020. CBG 117 on admission.  - CBG monitoring - SSI  Hx of dementia: Per family, patient has been experiencing neurocognitive decline over past 3 months. Prior MRIs without any acute etiology of patient's decline. Unknown family history of dementia.  No behavioral abnormalities noted on examination. Patient appears to be oriented x3 however is a poor historian with notable memory deficits.  - Continue memantine 68m qd  Hx of A.fibrillation:  Patient with history of A.fib on Eliquis. EKG with normal sinus rhythm and QTc not prolonged.  - Continue Eliquis 558mbid  - Continue sotalol   Hx of CAD s/p PCI:  Continue home medications of plavix and metoprolol.   Diet: Carb modified  Code: FULL Fluids: NS >> 1/2NS  VTE Prophylaxis: Eliquis    Dispo: Admit patient to Inpatient with expected length of stay greater than 2 midnights.  Signed: AsHarvie HeckMD  Internal Medicine, PGY-1 09/05/2019, 11:33 PM  Pager: [phone]

## 2019-09-05 NOTE — Progress Notes (Signed)
Lower extremity venous has been completed.   Preliminary results in CV Proc.   Abram Sander 09/05/2019 6:26 PM

## 2019-09-05 NOTE — ED Provider Notes (Signed)
[facility] EMERGENCY DEPARTMENT Provider Note   CSN: [id number] Arrival date & time: 09/05/19  1537     History   Chief Complaint No chief complaint on file.   HPI Duane [name] is a 84 y.o. male with history of A. fib, hypertension, hyperlipidemia, CKD, OSA, dementia presents today sent from [facility] assisted living facility for evaluation of progressively worsening generalized weakness.  He reportedly has been experiencing diarrhea for 1 month which he tells me has been worsening.  He tells me he has had under 10 episodes of watery nonbloody stools daily.  He denies urinary symptoms, abdominal pain, nausea, vomiting, chest pain, or shortness of breath.  He has some right hip pain and right lower extremity swelling, unsure how long it has been present but tells me that he has had x-rays that have been negative.  He does note that he has had increased falls but cannot remember the last time he fell.  He was apparently cathed today in order to obtain urine as he had not had any urine output for 8 hours and they did not get any urine return but he has voided while in the ED.  He is oriented to person place and year and knows that Daisy Floro is the president.  He does not know the day of the week.  He is unsure if he has been treated with any antibiotics recently.  He appears mildly confused.  Per family the patient was independent until around 3 months ago and now has declined physically and cognitively.     The history is provided by the patient, the EMS personnel and medical records. The history is limited by the condition of the patient.    Past Medical History:  Diagnosis Date   A-fib ([place])    Anemia    Angina pectoris (HCC)    Arthritis    Carpal tunnel syndrome    Cobalamin deficiency    Coronary artery disease    Diabetes mellitus without complication (HCC)    Eczema    Hyperlipemia    Hypertension    Kidney disease, chronic, stage III  (moderate, EGFR 30-59 ml/min)    Lung infiltrate    Myocardial infarct, old    Prostatic hypertrophy    Sleep apnea, obstructive    Squamous cell cancer of lip     Patient Active Problem List   Diagnosis Date Noted   Hyponatremia 09/05/2019   Encephalopathy 07/23/2019   Respiratory failure with hypoxia ([place]) 09/08/2016   Abdominal pain, right upper quadrant 09/08/2016   Essential hypertension 09/08/2016   PAF (paroxysmal atrial fibrillation) ([place]) 09/08/2016   Diabetes mellitus type 2, controlled ([place]) 09/08/2016   ARF (acute renal failure) ([place]) 09/08/2016   Normochromic normocytic anemia 09/08/2016   Abdominal pain 09/08/2016    Past Surgical History:  Procedure Laterality Date   CORONARY ANGIOPLASTY WITH STENT PLACEMENT          Home Medications    Prior to Admission medications   Medication Sig Start Date End Date Taking? Authorizing Provider  apixaban (ELIQUIS) 5 MG TABS tablet Take 5 mg by mouth 2 (two) times daily.    [provider]  Ascorbic Acid (VITAMIN C PO) Take 1 tablet by mouth daily.    [provider]  b complex vitamins tablet Take 1 tablet by mouth daily.    [provider]  Calcium-Magnesium-Vitamin D (CALCIUM MAGNESIUM PO) Take 1 tablet by mouth daily.     [provider]  calcium-vitamin D (OSCAL WITH D) 500-200 MG-UNIT tablet Take 1 tablet by mouth daily with breakfast.    [provider]  clopidogrel (PLAVIX) 75 MG tablet Take 75 mg by mouth daily.    [provider]  diclofenac sodium (VOLTAREN) 1 % GEL Apply 2 g topically daily as needed (For muscle pain).     [provider]  EPINEPHrine (EPIPEN 2-PAK) 0.3 mg/0.3 mL IJ SOAJ injection Inject 0.3 mg into the muscle once as needed (severe allergic reaction).    [provider]  losartan (COZAAR) 100 MG tablet Take 100 mg by mouth daily.    [provider]  memantine (NAMENDA) 5 MG tablet Take 5 mg by  mouth daily.    [provider]  metFORMIN (GLUCOPHAGE) 1000 MG tablet Take 1,000 mg by mouth 2 (two) times daily with a meal. 07/05/16   [provider]  metoprolol tartrate (LOPRESSOR) 50 MG tablet Take 1 tablet (50 mg total) by mouth 2 (two) times daily. 07/27/19   Hosie Poisson, MD  nitroGLYCERIN (NITROSTAT) 0.4 MG SL tablet Place 0.4 mg under the tongue every 5 (five) minutes as needed for chest pain.    [provider]  Omega-3-6-9 CAPS Take 2 capsules by mouth 2 (two) times daily.     [provider]  OVER THE COUNTER MEDICATION Apply 1 application topically 2 (two) times daily as needed (pain). CBD Oil    [provider]  pantoprazole (PROTONIX) 40 MG tablet Take 1 tablet (40 mg total) by mouth daily. 07/28/19   Hosie Poisson, MD  polyethylene glycol (MIRALAX / GLYCOLAX) 17 g packet Take 17 g by mouth daily. 07/28/19   Hosie Poisson, MD  Probiotic Product (PROBIOTIC PO) Take 1 tablet by mouth daily.    [provider]  saw palmetto 160 MG capsule Take 160 mg by mouth 2 (two) times daily.    [provider]  sitaGLIPtin (JANUVIA) 25 MG tablet Take 25 mg by mouth every evening.    [provider]  sotalol (BETAPACE) 80 MG tablet Take 80 mg by mouth 2 (two) times daily.    [provider]    Family History Family History  Problem Relation Age of Onset   Hypertension Other     Social History Social History   Tobacco Use   Smoking status: Never Smoker   Smokeless tobacco: Never Used  Substance Use Topics   Alcohol use: No   Drug use: Not on file     Allergies   Baclofen, Bee venom, Contrast media [iodinated diagnostic agents], Iodine-131, Metrizamide, Metronidazole, Atorvastatin, and Lisinopril   Review of Systems Review of Systems  Constitutional: Negative for chills and fever.  Respiratory: Negative for shortness of breath.   Cardiovascular: Negative for chest pain.  Gastrointestinal:  Positive for diarrhea. Negative for abdominal pain, nausea and vomiting.  Genitourinary: Positive for decreased urine volume.  Neurological: Positive for weakness (generalized). Negative for headaches.  All other systems reviewed and are negative.    Physical Exam Updated Vital Signs BP (!) 164/69    Pulse 80    Temp 98.6 F (37 C) (Oral)    Resp 16    SpO2 100%   Physical Exam Vitals signs and nursing note reviewed.  Constitutional:      General: He is not in acute distress.    Appearance: He is well-developed.  HENT:     Head: Normocephalic and atraumatic.  Eyes:     General:  Right eye: No discharge.        Left eye: No discharge.     Conjunctiva/sclera: Conjunctivae normal.  Neck:     Vascular: No JVD.     Trachea: No tracheal deviation.  Cardiovascular:     Rate and Rhythm: Normal rate and regular rhythm.  Pulmonary:     Effort: Pulmonary effort is normal.     Breath sounds: Normal breath sounds.  Abdominal:     General: Bowel sounds are increased. There is no distension.     Palpations: Abdomen is soft.     Tenderness: There is abdominal tenderness in the epigastric area, periumbilical area and suprapubic area.  Musculoskeletal:        General: Tenderness present.     Comments: Tenderness to palpation of the right hip anteriorly, some pain with flexion passively.  2+ pitting edema of the right lower extremity as compared to the left.  Some soreness with palpation.  No midline spine tenderness.  5/5 strength of BUE and BLE major muscle groups.  Skin:    General: Skin is warm and dry.     Findings: No erythema.  Neurological:     Mental Status: He is alert. He is disoriented.     Comments: Mildly confused.  Speech is fluent with no evidence of dysarthria or aphasia.  No facial droop.  Cranial nerves II through XII tested and intact.  Moves all extremities spontaneously without difficulty aside from some pain with right hip flexion.  Sensation intact to light  touch of face and extremities.  Psychiatric:        Behavior: Behavior normal.      ED Treatments / Results  Labs (all labs ordered are listed, but only abnormal results are displayed) Labs Reviewed  CBC WITH DIFFERENTIAL/PLATELET - Abnormal; Notable for the following components:      Result Value   RBC 3.96 (*)    Hemoglobin 11.3 (*)    HCT 33.7 (*)    All other components within normal limits  COMPREHENSIVE METABOLIC PANEL - Abnormal; Notable for the following components:   Sodium 120 (*)    Chloride 90 (*)    CO2 21 (*)    Glucose, Bld 124 (*)    Creatinine, Ser 1.31 (*)    Calcium 8.7 (*)    Total Protein 6.2 (*)    GFR calc non Af Amer 50 (*)    GFR calc Af Amer 58 (*)    All other components within normal limits  URINALYSIS, ROUTINE W REFLEX MICROSCOPIC - Abnormal; Notable for the following components:   APPearance CLOUDY (*)    Nitrite POSITIVE (*)    Leukocytes,Ua LARGE (*)    WBC, UA >50 (*)    Bacteria, UA RARE (*)    All other components within normal limits  CBG MONITORING, ED - Abnormal; Notable for the following components:   Glucose-Capillary 117 (*)    All other components within normal limits  CBG MONITORING, ED - Abnormal; Notable for the following components:   Glucose-Capillary 145 (*)    All other components within normal limits  GI PATHOGEN PANEL BY PCR, STOOL  C DIFFICILE QUICK SCREEN W PCR REFLEX  SARS CORONAVIRUS 2 (TAT 6-24 HRS)  URINE CULTURE  URINE CULTURE  LIPASE, BLOOD  BASIC METABOLIC PANEL  BASIC METABOLIC PANEL  BASIC METABOLIC PANEL  BASIC METABOLIC PANEL  CBC  SODIUM, URINE, RANDOM  OSMOLALITY, URINE  OSMOLALITY    EKG None  Radiology Dg Chest  Portable 1 View  Result Date: 09/05/2019 CLINICAL DATA:  Diarrhea for 1 month intermittently worse for last 3 days, weakness, history coronary artery disease, diabetes mellitus, hypertension EXAM: PORTABLE CHEST 1 VIEW COMPARISON:  Portable exam 1743 hours compared to  07/23/2019 FINDINGS: Normal heart size, mediastinal contours, and pulmonary vascularity. Lungs clear. No infiltrate, pleural effusion or pneumothorax. Bones demineralized. IMPRESSION: No acute abnormalities. Electronically Signed   By: Lavonia Dana M.D.   On: 09/05/2019 18:00   Dg Hip Unilat With Pelvis 2-3 Views Right  Result Date: 09/05/2019 CLINICAL DATA:  Pain in leg swelling EXAM: DG HIP (WITH OR WITHOUT PELVIS) 2-3V RIGHT COMPARISON:  Hip radiographs dated 08/20/2019 FINDINGS: There is no evidence of hip fracture or dislocation. Mild degenerative changes are seen in the hips. Moderate degenerative changes are seen in the lumbar spine. A penile implant is redemonstrated. IMPRESSION: Mild degenerative changes in the hips. No evidence of hip fracture or dislocation. Electronically Signed   By: Zerita Boers M.D.   On: 09/05/2019 17:22   Vas [place] Lower Extremity Venous (dvt) (only Mc & Wl 7a-7p)  Result Date: 09/05/2019  Lower Venous Study Indications: Edema, and Swelling.  Comparison Study: no prior Performing Technologist: Abram Sander RVS  Examination Guidelines: A complete evaluation includes B-mode imaging, spectral Doppler, color Doppler, and power Doppler as needed of all accessible portions of each vessel. Bilateral testing is considered an integral part of a complete examination. Limited examinations for reoccurring indications may be performed as noted.  +---------+---------------+---------+-----------+----------+--------------+  RIGHT     Compressibility Phasicity Spontaneity Properties Thrombus Aging  +---------+---------------+---------+-----------+----------+--------------+  CFV       Full            Yes       Yes                                    +---------+---------------+---------+-----------+----------+--------------+  SFJ       Full                                                             +---------+---------------+---------+-----------+----------+--------------+  FV Prox   Full                                                              +---------+---------------+---------+-----------+----------+--------------+  FV Mid    Full                                                             +---------+---------------+---------+-----------+----------+--------------+  FV Distal Full                                                             +---------+---------------+---------+-----------+----------+--------------+  PFV       Full                                                             +---------+---------------+---------+-----------+----------+--------------+  POP       Full            Yes       Yes                                    +---------+---------------+---------+-----------+----------+--------------+  PTV       Full                                                             +---------+---------------+---------+-----------+----------+--------------+  PERO      Full                                                             +---------+---------------+---------+-----------+----------+--------------+     *See table(s) above for measurements and observations.    Preliminary     Procedures .Critical Care Performed by: Renita Papa, PA-C Authorized by: Renita Papa, PA-C   Critical care provider statement:    Critical care time (minutes):  45   Critical care was necessary to treat or prevent imminent or life-threatening deterioration of the following conditions:  Metabolic crisis   Critical care was time spent personally by me on the following activities:  Discussions with consultants, evaluation of patient's response to treatment, examination of patient, ordering and performing treatments and interventions, ordering and review of laboratory studies, ordering and review of radiographic studies, pulse oximetry, re-evaluation of patient's condition, obtaining history from patient or surrogate and review of old charts   (including critical care time)  Medications Ordered in  ED Medications  memantine (NAMENDA) tablet 5 mg (has no administration in time range)  pantoprazole (PROTONIX) EC tablet 40 mg (has no administration in time range)  Probiotic CAPS (has no administration in time range)  apixaban (ELIQUIS) tablet 5 mg (has no administration in time range)  clopidogrel (PLAVIX) tablet 75 mg (has no administration in time range)  calcium-vitamin D (OSCAL WITH D) 500-200 MG-UNIT per tablet 1 tablet (has no administration in time range)  acetaminophen (TYLENOL) tablet 650 mg (has no administration in time range)    Or  acetaminophen (TYLENOL) suppository 650 mg (has no administration in time range)  insulin aspart (novoLOG) injection 0-9 Units (has no administration in time range)  insulin aspart (novoLOG) injection 0-5 Units (has no administration in time range)  cefTRIAXone (ROCEPHIN) 1 g in sodium chloride 0.9 % 100 mL IVPB (has no administration in time range)  metoprolol tartrate (LOPRESSOR) tablet 50 mg (has no administration in time range)  sotalol (BETAPACE) tablet 80 mg (has no administration in time range)  0.9 %  sodium chloride infusion (has no  administration in time range)  sodium chloride 0.9 % bolus 500 mL (0 mLs Intravenous Stopped 09/05/19 1750)  cefTRIAXone (ROCEPHIN) 1 g in sodium chloride 0.9 % 100 mL IVPB (0 g Intravenous Stopped 09/05/19 2019)     Initial Impression / Assessment and Plan / ED Course  I have reviewed the triage vital signs and the nursing notes.  Pertinent labs & imaging results that were available during my care of the patient were reviewed by me and considered in my medical decision making (see chart for details).        Patient presenting for evaluation of diarrhea, generalized weakness, dehydration.  He is afebrile, intermittently hypertensive in the ED.  Vital signs otherwise stable and he is nontoxic in appearance.  He is at his mental status baseline.  Clinically appears a little dry, will give fluid bolus and  obtain blood work and reassess.  Lab work reviewed by me significant for hyponatremia sodium 120 down from 134 compared to last month, mildly elevated creatinine.  H&H is stable.  No leukocytosis.  UA concerning for UTI, will culture.  Will start Rocephin.  Chest x-ray shows no acute cardiopulmonary abnormalities with no evidence of pneumonia or pleural effusion.  He did have some right lower extremity edema and right hip pain on exam but radiographs show no evidence of fracture.  DVT study ordered.  Spoke with Dr. Frederico Hamman with internal medicine teaching service who agrees to assume care of patient and bring him into the hospital for further evaluation and management.  Final Clinical Impressions(s) / ED Diagnoses   Final diagnoses:  Acute cystitis with hematuria  Hyponatremia  Generalized weakness    ED Discharge Orders    None       Debroah Baller 09/05/19 2200    Daleen Bo, MD 09/08/19 [phone]

## 2019-09-06 DIAGNOSIS — I4891 Unspecified atrial fibrillation: Secondary | ICD-10-CM

## 2019-09-06 DIAGNOSIS — Z888 Allergy status to other drugs, medicaments and biological substances status: Secondary | ICD-10-CM

## 2019-09-06 DIAGNOSIS — E871 Hypo-osmolality and hyponatremia: Secondary | ICD-10-CM

## 2019-09-06 DIAGNOSIS — Z91041 Radiographic dye allergy status: Secondary | ICD-10-CM

## 2019-09-06 DIAGNOSIS — E1122 Type 2 diabetes mellitus with diabetic chronic kidney disease: Secondary | ICD-10-CM

## 2019-09-06 DIAGNOSIS — I129 Hypertensive chronic kidney disease with stage 1 through stage 4 chronic kidney disease, or unspecified chronic kidney disease: Secondary | ICD-10-CM

## 2019-09-06 DIAGNOSIS — G8929 Other chronic pain: Secondary | ICD-10-CM

## 2019-09-06 DIAGNOSIS — I2510 Atherosclerotic heart disease of native coronary artery without angina pectoris: Secondary | ICD-10-CM

## 2019-09-06 DIAGNOSIS — Z91030 Bee allergy status: Secondary | ICD-10-CM

## 2019-09-06 DIAGNOSIS — N179 Acute kidney failure, unspecified: Principal | ICD-10-CM

## 2019-09-06 DIAGNOSIS — R159 Full incontinence of feces: Secondary | ICD-10-CM

## 2019-09-06 DIAGNOSIS — E860 Dehydration: Secondary | ICD-10-CM

## 2019-09-06 DIAGNOSIS — Z955 Presence of coronary angioplasty implant and graft: Secondary | ICD-10-CM

## 2019-09-06 DIAGNOSIS — E1142 Type 2 diabetes mellitus with diabetic polyneuropathy: Secondary | ICD-10-CM

## 2019-09-06 DIAGNOSIS — R197 Diarrhea, unspecified: Secondary | ICD-10-CM

## 2019-09-06 DIAGNOSIS — Z79899 Other long term (current) drug therapy: Secondary | ICD-10-CM

## 2019-09-06 DIAGNOSIS — E785 Hyperlipidemia, unspecified: Secondary | ICD-10-CM

## 2019-09-06 DIAGNOSIS — N183 Chronic kidney disease, stage 3 unspecified: Secondary | ICD-10-CM

## 2019-09-06 DIAGNOSIS — M25551 Pain in right hip: Secondary | ICD-10-CM

## 2019-09-06 DIAGNOSIS — Z7901 Long term (current) use of anticoagulants: Secondary | ICD-10-CM

## 2019-09-06 DIAGNOSIS — Z881 Allergy status to other antibiotic agents status: Secondary | ICD-10-CM

## 2019-09-06 DIAGNOSIS — Z7902 Long term (current) use of antithrombotics/antiplatelets: Secondary | ICD-10-CM

## 2019-09-06 DIAGNOSIS — E861 Hypovolemia: Secondary | ICD-10-CM

## 2019-09-06 DIAGNOSIS — F0390 Unspecified dementia without behavioral disturbance: Secondary | ICD-10-CM

## 2019-09-06 DIAGNOSIS — N390 Urinary tract infection, site not specified: Secondary | ICD-10-CM

## 2019-09-06 LAB — BASIC METABOLIC PANEL
Anion gap: 10 (ref 5–15)
Anion gap: 11 (ref 5–15)
Anion gap: 10 (ref 5–15)
Anion gap: 12 (ref 5–15)
Anion gap: 14 (ref 5–15)
Anion gap: 9 (ref 5–15)
BUN: 14 mg/dL (ref 8–23)
BUN: 13 mg/dL (ref 8–23)
BUN: 12 mg/dL (ref 8–23)
BUN: 11 mg/dL (ref 8–23)
BUN: 11 mg/dL (ref 8–23)
BUN: 10 mg/dL (ref 8–23)
CO2: 23 mmol/L (ref 22–32)
CO2: 23 mmol/L (ref 22–32)
CO2: 24 mmol/L (ref 22–32)
CO2: 20 mmol/L — ABNORMAL LOW (ref 22–32)
CO2: 19 mmol/L — ABNORMAL LOW (ref 22–32)
CO2: 24 mmol/L (ref 22–32)
Calcium: 8.7 mg/dL — ABNORMAL LOW (ref 8.9–10.3)
Calcium: 9.2 mg/dL (ref 8.9–10.3)
Calcium: 9.1 mg/dL (ref 8.9–10.3)
Calcium: 8.7 mg/dL — ABNORMAL LOW (ref 8.9–10.3)
Calcium: 8.8 mg/dL — ABNORMAL LOW (ref 8.9–10.3)
Calcium: 8.7 mg/dL — ABNORMAL LOW (ref 8.9–10.3)
Chloride: 94 mmol/L — ABNORMAL LOW (ref 98–111)
Chloride: 94 mmol/L — ABNORMAL LOW (ref 98–111)
Chloride: 97 mmol/L — ABNORMAL LOW (ref 98–111)
Chloride: 97 mmol/L — ABNORMAL LOW (ref 98–111)
Chloride: 97 mmol/L — ABNORMAL LOW (ref 98–111)
Chloride: 97 mmol/L — ABNORMAL LOW (ref 98–111)
Creatinine, Ser: 1.13 mg/dL (ref 0.61–1.24)
Creatinine, Ser: 1.13 mg/dL (ref 0.61–1.24)
Creatinine, Ser: 1.07 mg/dL (ref 0.61–1.24)
Creatinine, Ser: 1.04 mg/dL (ref 0.61–1.24)
Creatinine, Ser: 1.03 mg/dL (ref 0.61–1.24)
Creatinine, Ser: 1.08 mg/dL (ref 0.61–1.24)
GFR calc Af Amer: >60 mL/min (ref >60)
GFR calc Af Amer: >60 mL/min (ref >60)
GFR calc Af Amer: >60 mL/min (ref >60)
GFR calc Af Amer: >60 mL/min (ref >60)
GFR calc Af Amer: >60 mL/min (ref >60)
GFR calc Af Amer: >60 mL/min (ref >60)
GFR calc non Af Amer: >60 mL/min (ref >60)
GFR calc non Af Amer: >60 mL/min (ref >60)
GFR calc non Af Amer: >60 mL/min (ref >60)
GFR calc non Af Amer: >60 mL/min (ref >60)
GFR calc non Af Amer: >60 mL/min (ref >60)
GFR calc non Af Amer: >60 mL/min (ref >60)
Glucose, Bld: 126 mg/dL — ABNORMAL HIGH (ref 70–99)
Glucose, Bld: 139 mg/dL — ABNORMAL HIGH (ref 70–99)
Glucose, Bld: 146 mg/dL — ABNORMAL HIGH (ref 70–99)
Glucose, Bld: 196 mg/dL — ABNORMAL HIGH (ref 70–99)
Glucose, Bld: 146 mg/dL — ABNORMAL HIGH (ref 70–99)
Glucose, Bld: 136 mg/dL — ABNORMAL HIGH (ref 70–99)
Potassium: 3.6 mmol/L (ref 3.5–5.1)
Potassium: 4 mmol/L (ref 3.5–5.1)
Potassium: 4.4 mmol/L (ref 3.5–5.1)
Potassium: 4.1 mmol/L (ref 3.5–5.1)
Potassium: 3.9 mmol/L (ref 3.5–5.1)
Potassium: 3.7 mmol/L (ref 3.5–5.1)
Sodium: 127 mmol/L — ABNORMAL LOW (ref 135–145)
Sodium: 128 mmol/L — ABNORMAL LOW (ref 135–145)
Sodium: 131 mmol/L — ABNORMAL LOW (ref 135–145)
Sodium: 129 mmol/L — ABNORMAL LOW (ref 135–145)
Sodium: 130 mmol/L — ABNORMAL LOW (ref 135–145)
Sodium: 130 mmol/L — ABNORMAL LOW (ref 135–145)

## 2019-09-06 LAB — CBC
HCT: 36.7 % — ABNORMAL LOW (ref 39.0–52.0)
Hemoglobin: 12.1 g/dL — ABNORMAL LOW (ref 13.0–17.0)
MCH: 27.9 pg (ref 26.0–34.0)
MCHC: 33 g/dL (ref 30.0–36.0)
MCV: 84.8 fL (ref 80.0–100.0)
Platelets: 237 10*3/uL (ref 150–400)
RBC: 4.33 MIL/uL (ref 4.22–5.81)
RDW: 14.7 % (ref 11.5–15.5)
WBC: 8.8 10*3/uL (ref 4.0–10.5)
nRBC: 0 % (ref 0.0–0.2)

## 2019-09-06 LAB — C DIFFICILE QUICK SCREEN W PCR REFLEX
C Diff antigen: NEGATIVE
C Diff interpretation: NOT DETECTED
C Diff toxin: NEGATIVE

## 2019-09-06 LAB — GLUCOSE, CAPILLARY
Glucose-Capillary: 201 mg/dL — ABNORMAL HIGH (ref 70–99)
Glucose-Capillary: 124 mg/dL — ABNORMAL HIGH (ref 70–99)
Glucose-Capillary: 128 mg/dL — ABNORMAL HIGH (ref 70–99)

## 2019-09-06 LAB — CBG MONITORING, ED: Glucose-Capillary: 135 mg/dL — ABNORMAL HIGH (ref 70–99)

## 2019-09-06 LAB — VITAMIN B12: Vitamin B-12: 264 pg/mL (ref 180–914)

## 2019-09-06 LAB — OSMOLALITY: Osmolality: 274 mOsm/kg — ABNORMAL LOW (ref 275–295)

## 2019-09-06 MED ORDER — SODIUM CHLORIDE 0.45 % IV SOLN
INTRAVENOUS | Status: DC
  Administered 2019-09-06: 14:00:00 via INTRAVENOUS

## 2019-09-06 MED ORDER — LOSARTAN POTASSIUM 50 MG PO TABS
50.0000 mg | ORAL_TABLET | Freq: Every day | ORAL | Status: DC
  Administered 2019-09-06 – 2019-09-07 (×2): 50 mg via ORAL
  Filled 2019-09-06 (×2): qty 1

## 2019-09-06 MED ORDER — DEXTROSE 5 % IV SOLN
INTRAVENOUS | Status: DC
  Administered 2019-09-06: 11:00:00 via INTRAVENOUS

## 2019-09-06 MED ORDER — LOPERAMIDE HCL 2 MG PO CAPS: 2.0000 mg | ORAL_CAPSULE | Freq: Four times a day (QID) | ORAL | Status: DC | PRN

## 2019-09-06 MED ORDER — DEXTROSE 5 % IV SOLN
INTRAVENOUS | Status: DC
  Administered 2019-09-06: 18:00:00 via INTRAVENOUS

## 2019-09-06 NOTE — Progress Notes (Signed)
NAME:  Duane [name], MRN:  [id number], DOB:  1935-03-20, LOS: 1 ADMISSION DATE:  09/05/2019, Primary: Duane Ruddle, MD  CHIEF COMPLAINT: Dehydration and diarrhea  Medical Service: Internal Medicine Teaching Service         Attending Physician: Dr. Velna Ochs, MD    First Contact: Dr. Darrick Meigs Pager: [phone]  Second Contact: Dr. Sharon Seller Pager: [phone]       After Hours (After 5p/  First Contact Pager: [phone]  weekends / holidays): Second Contact Pager: [phone]    Brief History  84 year old male with past medical history of hypertension, atrial fibrillation, type 2 diabetes, and dementia who resides at an assisted living facility.  Presented to the ED on 10/21 for evaluation of diarrhea and dehydration.  ED findings consistent with hyponatremia and UTI.  Patient was admitted for further evaluation and management.  Subjective  No complaints this morning.  Sitting up in bed eating breakfast.  Objective   Blood pressure (!) 143/69, pulse 86, temperature 98.6 F (37 C), temperature source Oral, resp. rate 17, SpO2 96 %.     Intake/Output Summary (Last 24 hours) at 09/06/2019 1126 Last data filed at 09/06/2019 1016 Gross per 24 hour  Intake 817.35 ml  Output 1450 ml  Net -632.65 ml   Examination: GENERAL: in no acute distress CARDIAC: heart RRR. No peripheral edema.  PULMONARY: acyanotic. Lung sounds clear to auscultation. ABDOMEN: Bowel sounds active.  Soft. Nontender to palpation.  Nondistended. NEURO: Alert and oriented SKIN: no rash or lesions on limited exam   Significant Diagnostic Tests:  10/21 Chest x-ray: Negative for infiltrate or effusion.  Micro Data:  10/21 urine culture>> C. Difficile>> GI pathogen panel>>  Antimicrobials:  Rocephin 10/21>>  Labs    CBC Latest Ref Rng & Units 09/06/2019 09/05/2019 07/26/2019  WBC 4.0 - 10.5 K/uL 8.8 9.2 9.2  Hemoglobin 13.0 - 17.0 g/dL 12.1(L) 11.3(L) 11.1(L)  Hematocrit 39.0 - 52.0 % 36.7(L) 33.7(L)  32.9(L)  Platelets 150 - 400 K/uL 237 224 217   BMP Latest Ref Rng & Units 09/06/2019 09/06/2019 09/06/2019  Glucose 70 - 99 mg/dL 146(H) 139(H) 126(H)  BUN 8 - 23 mg/dL 12 13 14   Creatinine 0.61 - 1.24 mg/dL 1.07 1.13 1.13  Sodium 135 - 145 mmol/L 131(L) 128(L) 127(L)  Potassium 3.5 - 5.1 mmol/L 4.4 4.0 3.6  Chloride 98 - 111 mmol/L 97(L) 94(L) 94(L)  CO2 22 - 32 mmol/L 24 23 23   Calcium 8.9 - 10.3 mg/dL 9.1 9.2 8.7(L)   UA + leukocytes, nitrites, rare bacteria   Summary  84 year old male admitted on 10/21 for dehydration/AKI secondary to diarrhea, hyponatremia, and urinary tract infection.  Assessment & Plan:  Principal Problem:   Hyponatremia Active Problems:   Diarrhea   UTI (urinary tract infection)  Dehydration/AKI on CKD 3.  Baseline creatinine around 1.  1.3 on admission. Hypovolemic hyponatremia.  Sodium 120 on admission.  Baseline sodium appears to be around 127- 128. treated with IV fluids and was switched to half-normal saline due to rapid increase in sodium.  Sodium continued to increase to 131 at 8 AM labs. Urine studies pending.  Serum osmolality 274--normal Plan Continue IV fluids.  Patient switched to D5 to avoid overcorrection of sodium Continue every 4 sodium checks  Acute diarrhea.  Afebrile.  No leukocytosis. Infection vs 2/2 to UTI.  3 to 4 days of progressive watery, nonbloody stools.  Patient was recently hospitalized and discharged on 9/11.  No recent antibiotic use. No recent medication changes.  Plan GI pathogen panel and C. difficile test pending Continue IV hydration Enteric precautions   Urinary tract infection.  Afebrile.  UA on admission significant for leukocytes, nitrites and bacteria.  Urine culture pending. Plan Continue Rocephin We will continue to monitor fever curve.  Chronic right hip pain with associated right lower extremity weakness.  Follows with Ortho for this.  Patient scheduled for MRI of lumbar spine and right hip to rule out  nerve compression and hip fracture. Plan PT/OT We will obtain MRI during hospitalization  Type 2 diabetes.  Last A1c 6.3 on 07/2019.  Continue sliding scale insulin and CBGs.  Diabetic diet  Dementia.  Continue memantine. Atrial fibrillation.  Continue Eliquis and sotalol Coronary artery disease status post PCI.  Continue Plavix and metoprolol  Best practice:  CODE STATUS: Full Diet: Diabetic Glucose control: Sliding scale insulin and CBGs DVT for prophylaxis: Eliquis Dispo: Ending medical stabilization  Mitzi Hansen, MD INTERNAL MEDICINE RESIDENT PGY-1 PAGER #: [phone] 09/06/19 11:26 AM

## 2019-09-06 NOTE — ED Notes (Signed)
Breakfast ordered 

## 2019-09-06 NOTE — Discharge Instructions (Signed)

## 2019-09-06 NOTE — ED Notes (Signed)
TELE 

## 2019-09-06 NOTE — Progress Notes (Signed)
  Date: 09/06/2019  Patient name: Duane [name]  Medical record number: [id number]  Date of birth: 1935/05/18        I have seen and evaluated this patient and I have discussed the plan of care with the house staff. Please see their note for complete details. I concur with their findings.  Please see my attested H&P from earlier today.   Velna Ochs, MD 09/06/2019, 1:04 PM

## 2019-09-06 NOTE — ED Notes (Signed)
Tele ordered lunch

## 2019-09-06 NOTE — Progress Notes (Signed)
PT Cancellation Note  Patient Details Name: Duane [name] MRN: [id number] DOB: Apr 04, 1935   Cancelled Treatment:    Reason Eval/Treat Not Completed: Other (comment) currently awaiting on MRI for further fracture rule-out. Will attempt to return if time/schedule allow and if patient is medically appropriate. If patient has urgent rehab needs, please direct message this therapist in [place] (8am-4pm) or call acute rehab office at number below.    Deniece Ree PT, DPT, CBIS  Supplemental Physical Therapist [facility]    Pager [phone] Acute Rehab Office [phone]

## 2019-09-06 NOTE — ED Notes (Signed)
Pt wet bed attempting to use urinal. Full linen change performed.

## 2019-09-06 NOTE — ED Notes (Signed)
Rn walked into pt's room pt was attempting to get out of bed by self. Pt is aware he should have assistance to get up d/t weakness and unsteadiness. Pt did not call out for help.

## 2019-09-06 NOTE — ED Notes (Signed)
Report attempted 

## 2019-09-07 DIAGNOSIS — B961 Klebsiella pneumoniae [K. pneumoniae] as the cause of diseases classified elsewhere: Secondary | ICD-10-CM

## 2019-09-07 LAB — BASIC METABOLIC PANEL
Anion gap: 10 (ref 5–15)
Anion gap: 9 (ref 5–15)
Anion gap: 9 (ref 5–15)
BUN: 6 mg/dL — ABNORMAL LOW (ref 8–23)
BUN: 7 mg/dL — ABNORMAL LOW (ref 8–23)
BUN: 9 mg/dL (ref 8–23)
CO2: 22 mmol/L (ref 22–32)
CO2: 22 mmol/L (ref 22–32)
CO2: 22 mmol/L (ref 22–32)
Calcium: 8.7 mg/dL — ABNORMAL LOW (ref 8.9–10.3)
Calcium: 8.8 mg/dL — ABNORMAL LOW (ref 8.9–10.3)
Calcium: 8.8 mg/dL — ABNORMAL LOW (ref 8.9–10.3)
Chloride: 100 mmol/L (ref 98–111)
Chloride: 97 mmol/L — ABNORMAL LOW (ref 98–111)
Chloride: 99 mmol/L (ref 98–111)
Creatinine, Ser: 0.96 mg/dL (ref 0.61–1.24)
Creatinine, Ser: 0.99 mg/dL (ref 0.61–1.24)
Creatinine, Ser: 0.99 mg/dL (ref 0.61–1.24)
GFR calc Af Amer: >60 mL/min (ref >60)
GFR calc Af Amer: >60 mL/min (ref >60)
GFR calc Af Amer: >60 mL/min (ref >60)
GFR calc non Af Amer: >60 mL/min (ref >60)
GFR calc non Af Amer: >60 mL/min (ref >60)
GFR calc non Af Amer: >60 mL/min (ref >60)
Glucose, Bld: 134 mg/dL — ABNORMAL HIGH (ref 70–99)
Glucose, Bld: 141 mg/dL — ABNORMAL HIGH (ref 70–99)
Glucose, Bld: 153 mg/dL — ABNORMAL HIGH (ref 70–99)
Potassium: 3.5 mmol/L (ref 3.5–5.1)
Potassium: 3.7 mmol/L (ref 3.5–5.1)
Potassium: 3.8 mmol/L (ref 3.5–5.1)
Sodium: 128 mmol/L — ABNORMAL LOW (ref 135–145)
Sodium: 130 mmol/L — ABNORMAL LOW (ref 135–145)
Sodium: 132 mmol/L — ABNORMAL LOW (ref 135–145)

## 2019-09-07 LAB — URINE CULTURE: Culture: >=100000 — AB

## 2019-09-07 LAB — GLUCOSE, CAPILLARY
Glucose-Capillary: 127 mg/dL — ABNORMAL HIGH (ref 70–99)
Glucose-Capillary: 167 mg/dL — ABNORMAL HIGH (ref 70–99)

## 2019-09-07 MED ORDER — SODIUM CHLORIDE 0.9 % IV SOLN
INTRAVENOUS | Status: DC
  Administered 2019-09-07: 01:00:00 via INTRAVENOUS

## 2019-09-07 MED ORDER — AMOXICILLIN-POT CLAVULANATE 875-125 MG PO TABS: 1.0000 | ORAL_TABLET | Freq: Two times a day (BID) | ORAL | 0 refills | Status: AC

## 2019-09-07 MED ORDER — LOPERAMIDE HCL 2 MG PO CAPS: 2.0000 mg | ORAL_CAPSULE | Freq: Four times a day (QID) | ORAL | 0 refills | Status: DC | PRN

## 2019-09-07 NOTE — TOC Transition Note (Signed)
Transition of Care [facility]) - CM/SW Discharge Note   Patient Details  Name: CAEDYN MATHENIA MRN: [id number] Date of Birth: [birth year]  [facility]) CM/SW Contact:  Bartholomew Crews, RN Phone Number: [phone] 09/07/2019, 3:42 PM   Clinical Narrative:    Spoke with patient and son at bedside to discuss transition of care in coordination with social worker. PTA patient resided at [facility]. After speaking with Luellen Pucker in admissions at [facility], patient will transition today to [facility] for quarantine.   NCM contacted [place] [employer] to verify if patient insurance is HMO vs PPO - patient has HMO which is out of network with [facility]. Discussed options with with representative at [facility] who advised that a geographic gap case could be opened in order to get patient stay approved. Information provided for prior authorization - Reference [id number].   Patient to transition back to [facility] today. [facility] to provide transport.    Final next level of care: Skilled Nursing Facility Barriers to Discharge: No Barriers Identified   Patient Goals and CMS Choice Patient states their goals for this hospitalization and ongoing recovery are:: Pt will go to [facility] CMS Medicare.gov Compare Post Acute Care list provided to:: (NA) Choice offered to / list presented to : NA  Discharge Placement   Existing PASRR number confirmed : 09/07/19          Patient [facility] bed at: [facility] Patient to be transferred to facility by: [place] Name of family member notified: Chip Patient and family notified of of transfer: 09/07/19  Discharge Plan and Services In-house Referral: Clinical Social Work Discharge Planning Services: NA Post Acute Care Choice: [place]          DME Arranged: N/A DME Agency: NA       HH Arranged: NA HH Agency: NA        Social Determinants of Health (SDOH) Interventions     Readmission Risk Interventions No  flowsheet data found.

## 2019-09-07 NOTE — NC FL2 (Addendum)
[place] LEVEL OF CARE SCREENING TOOL     IDENTIFICATION  Patient Name: Duane [name] Birthdate: Aug 09, 1935 Sex: male Admission Date (Current Location): 09/05/2019  [facility] and [place] Number:  Herbalist and Address:  [facility], [place] [address] [place], [place] [address]      Provider Number: [id number]  Attending Physician Name and Address:  Velna Ochs, MD  Relative Name and Phone Number:  [place], [place], [phone]    Current Level of Care: Hospital Recommended Level of Care: [place] Prior Approval Number:    Date Approved/Denied:   PASRR Number: [id number]  Discharge Plan: SNF    Current Diagnoses: Patient Active Problem List   Diagnosis Date Noted  . Hyponatremia 09/05/2019  . Diarrhea 09/05/2019  . UTI (urinary tract infection) 09/05/2019  . Encephalopathy 07/23/2019  . Essential hypertension 09/08/2016  . PAF (paroxysmal atrial fibrillation) ([place]) 09/08/2016  . Diabetes mellitus type 2, controlled ([place]) 09/08/2016  . ARF (acute renal failure) ([place]) 09/08/2016  . Normochromic normocytic anemia 09/08/2016  . Abdominal pain 09/08/2016    Orientation RESPIRATION BLADDER Height & Weight     Self, Place, Time  Normal Continent Weight:   Height:     BEHAVIORAL SYMPTOMS/MOOD NEUROLOGICAL BOWEL NUTRITION STATUS      Continent Diet(heart healthy/carb modified, thin liquids)  AMBULATORY STATUS COMMUNICATION OF NEEDS Skin   Limited Assist Verbally Normal                       Personal Care Assistance Level of Assistance  Bathing, Feeding, Dressing, Total care Bathing Assistance: Limited assistance Feeding assistance: Independent Dressing Assistance: Limited assistance Total Care Assistance: Limited assistance   Functional Limitations Info  Sight, Speech, Hearing Sight Info: Adequate Hearing Info: Adequate Speech Info: Adequate    SPECIAL CARE FACTORS FREQUENCY  PT (By  licensed PT), OT (By licensed OT)     PT Frequency: Minimum 3x per week OT Frequency: Minimum 2x per week            Contractures Contractures Info: Not present    Additional Factors Info  Code Status, Allergies, Insulin Sliding Scale, Psychotropic Code Status Info: Full Code Allergies Info: Baclofen, Bee Venom, Contrast Media (Iodinated Diagnostic Agents), Iodine-131, Metrizamide, Metronidazole, Atorvastatin, Lisinopril Psychotropic Info: namenda tablet 5mg  daily (dementia medication) Insulin Sliding Scale Info: insulin aspart novolog 0-5 units at bedtime & insulin aspart novolog 0-9 units 3x daily w/meals       Current Medications (09/07/2019):  This is the current hospital active medication list Current Facility-Administered Medications  Medication Dose Route Frequency Provider Last Rate Last Dose  . 0.9 %  sodium chloride infusion   Intravenous Continuous Harvie Heck, MD 50 mL/hr at 09/07/19 0125    . acetaminophen (TYLENOL) tablet 650 mg  650 mg Oral Q6H PRN Welford Roche, MD   650 mg at 09/05/19 2316   Or  . acetaminophen (TYLENOL) suppository 650 mg  650 mg Rectal Q6H PRN Santos-Sanchez, Merlene Morse, MD      . acidophilus (RISAQUAD) capsule 1 capsule  1 capsule Oral Daily Welford Roche, MD   1 capsule at 09/07/19 0907  . apixaban (ELIQUIS) tablet 5 mg  5 mg Oral BID Welford Roche, MD   5 mg at 09/07/19 0907  . calcium-vitamin D (OSCAL WITH D) 500-200 MG-UNIT per tablet 1 tablet  1 tablet Oral Q breakfast Welford Roche, MD   1 tablet at 09/07/19 0907  .  cefTRIAXone (ROCEPHIN) 1 g in sodium chloride 0.9 % 100 mL IVPB  1 g Intravenous Q24H Santos-Sanchez, Idalys, MD 200 mL/hr at 09/06/19 1312 1 g at 09/06/19 1312  . clopidogrel (PLAVIX) tablet 75 mg  75 mg Oral Daily Welford Roche, MD   75 mg at 09/07/19 0907  . insulin aspart (novoLOG) injection 0-5 Units  0-5 Units Subcutaneous QHS Santos-Sanchez, Idalys, MD      . insulin aspart  (novoLOG) injection 0-9 Units  0-9 Units Subcutaneous TID WC Welford Roche, MD   2 Units at 09/07/19 1220  . loperamide (IMODIUM) capsule 2-4 mg  2-4 mg Oral Q6H PRN Seawell, Jaimie A, DO      . losartan (COZAAR) tablet 50 mg  50 mg Oral Daily Seawell, Jaimie A, DO   50 mg at 09/07/19 0907  . memantine (NAMENDA) tablet 5 mg  5 mg Oral Daily Welford Roche, MD   5 mg at 09/07/19 0908  . metoprolol tartrate (LOPRESSOR) tablet 50 mg  50 mg Oral BID Welford Roche, MD   50 mg at 09/07/19 0907  . pantoprazole (PROTONIX) EC tablet 40 mg  40 mg Oral Daily Welford Roche, MD   40 mg at 09/07/19 0908  . sotalol (BETAPACE) tablet 80 mg  80 mg Oral BID Welford Roche, MD   80 mg at 09/07/19 0908     Discharge Medications: Please see discharge summary for a list of discharge medications.  Relevant Imaging Results:  Relevant Lab Results:   Additional Information SSN: [id number]  Philippa Chester Michelina Mexicano, LCSWA

## 2019-09-07 NOTE — TOC Initial Note (Signed)
Transition of Care [facility]) - Initial/Assessment Note    Patient Details  Name: CHRISTO WITTS MRN: [id number] Date of Birth: 05-31-1935  Transition of Care [facility]) CM/SW Contact:    Gelene Mink, [place] Phone Number: 09/07/2019, 3:20 PM  Clinical Narrative:                  Patient able to return back to [facility]. He will go to the acuity unit for quarantine and rehabilitation. [facility] will provide transportation back.   CSW is awaiting discharge summary. CSW will coordinate discharge.   Expected Discharge Plan: Skilled Nursing Facility Barriers to Discharge: Continued Medical Work up   Patient Goals and CMS Choice Patient states their goals for this hospitalization and ongoing recovery are:: Pt will return back to [facility] CMS Medicare.gov Compare Post Acute Care list provided to:: Patient Choice offered to / list presented to : NA  Expected Discharge Plan and Services Expected Discharge Plan: [place] In-house Referral: Clinical Social Work Discharge Planning Services: NA Post Acute Care Choice: [place] Living arrangements for the past 2 months: [place]                 DME Arranged: N/A DME Agency: NA       HH Arranged: NA [place] Agency: NA        Prior Living Arrangements/Services Living arrangements for the past 2 months: [place] Lives with:: Facility Resident Patient language and need for interpreter reviewed:: No Do you feel safe going back to the place where you live?: Yes      Need for Family Participation in Patient Care: Yes (Comment) Care giver support system in place?: Yes (comment)   Criminal Activity/Legal Involvement Pertinent to Current Situation/Hospitalization: No - Comment as needed  Activities of Daily Living      Permission Sought/Granted Permission sought to share information with : Case Manager Permission granted to share information with : Yes, Verbal Permission  Granted  Share Information with NAME: [place] granted to share info w AGENCY: [facility]  Permission granted to share info w Relationship: Son's     Emotional Assessment Appearance:: Appears younger than stated age Attitude/Demeanor/Rapport: Unable to Assess Affect (typically observed): Unable to Assess Orientation: : Oriented to Self, Oriented to Place, Oriented to Situation Alcohol / Substance Use: Not Applicable Psych Involvement: No (comment)  Admission diagnosis:  Hyponatremia [E87.1] Acute cystitis with hematuria [N30.01] Generalized weakness [R53.1] Patient Active Problem List   Diagnosis Date Noted  . Hyponatremia 09/05/2019  . Diarrhea 09/05/2019  . UTI (urinary tract infection) 09/05/2019  . Encephalopathy 07/23/2019  . Essential hypertension 09/08/2016  . PAF (paroxysmal atrial fibrillation) ([place]) 09/08/2016  . Diabetes mellitus type 2, controlled ([place]) 09/08/2016  . ARF (acute renal failure) ([place]) 09/08/2016  . Normochromic normocytic anemia 09/08/2016  . Abdominal pain 09/08/2016   PCP:  [name], MD Pharmacy:   [place], [place] - [address] [place] [address] Phone: [phone] Fax: [phone]  [place], [place] [address] [place] [address] Phone: [phone] Fax: [phone]     Social Determinants of Health ([place]) Interventions    Readmission Risk Interventions No flowsheet data found.

## 2019-09-07 NOTE — TOC Transition Note (Signed)
Transition of Care [facility]) - CM/SW Discharge Note   Patient Details  Name: Duane [name] MRN: [id number] Date of Birth: 1935/07/25  Transition of Care [facility]) CM/SW Contact:  Bartholomew Crews, RN Phone Number: [phone] 09/07/2019, 5:20 PM   Clinical Narrative:    Damaris Schooner with Luellen Pucker in admissions at [facility] to advise that patient had been picked up by [facility] for transporation to facility.   Received call from [facility] requesting clinical information be faxed to intake at [phone]. Completed.    Final next level of care: Skilled Nursing Facility Barriers to Discharge: No Barriers Identified   Patient Goals and CMS Choice Patient states their goals for this hospitalization and ongoing recovery are:: Pt will go to [facility] CMS Medicare.gov Compare Post Acute Care list provided to:: (NA) Choice offered to / list presented to : NA  Discharge Placement   Existing PASRR number confirmed : 09/07/19          Patient chooses bed at: [facility] Patient to be transferred to facility by: [place] Name of family member notified: Chip Patient and family notified of of transfer: 09/07/19  Discharge Plan and Services In-house Referral: Clinical Social Work Discharge Planning Services: NA Post Acute Care Choice: [place]          DME Arranged: N/A DME Agency: NA       HH Arranged: NA HH Agency: NA        Social Determinants of Health (SDOH) Interventions     Readmission Risk Interventions No flowsheet data found.

## 2019-09-07 NOTE — TOC Transition Note (Signed)
Transition of Care [facility]) - CM/SW Discharge Note   Patient Details  Name: Duane [name] MRN: [id number] Date of Birth: 1935-06-13  Transition of Care [facility]) CM/SW Contact:  Gelene Mink, [place] Phone Number: 09/07/2019, 3:32 PM   Clinical Narrative:     Patient will DC to: [facility] Anticipated DC date: 09/07/2019 Family notified: Yes Transport by: Corey Harold   Per MD patient ready for DC to . RN, patient, patient's family, and facility notified of DC. Discharge Summary and FL2 sent to facility. RN to call report prior to discharge [phone]). The patient will go to room W300.  DC packet on chart. Ambulance transport requested for patient.   CSW will sign off for now as social work intervention is no longer needed. Please consult [place] again if new needs arise.  Nabeel Gladson, LCSW-A [place]/Clinical Social Work Department Cell: [phone]    Final next level of care: [place] Barriers to Discharge: No Barriers Identified   Patient Goals and CMS Choice Patient states their goals for this hospitalization and ongoing recovery are:: Pt will go to [facility] CMS Medicare.gov Compare Post Acute Care list provided to:: (NA) Choice offered to / list presented to : NA  Discharge Placement   Existing PASRR number confirmed : 09/07/19          Patient [facility] bed at: [facility] Patient to be transferred to facility by: [place] Name of family member notified: Chip Patient and family notified of of transfer: 09/07/19  Discharge Plan and Services In-house Referral: Clinical Social Work Discharge Planning Services: NA Post Acute Care Choice: [place]          DME Arranged: N/A DME Agency: NA       HH Arranged: NA HH Agency: NA        Social Determinants of Health (SDOH) Interventions     Readmission Risk Interventions No flowsheet data found.

## 2019-09-07 NOTE — Progress Notes (Signed)
  Date: 09/07/2019  Patient name: Duane [name]  Medical record number: [id number]  Date of birth: [birth year]        I have seen and evaluated this patient and I have discussed the plan of care with the house staff. Please see their note for complete details. I concur with their findings.  C diff negative, GI panel PCR pending. Diarrhea is resolving and AKI / hyponatremia have improved with IVFs. Unclear reason for diarrhea, can use imodium prn if needed. Plan to discharge back SNF today.   Velna Ochs, MD 09/07/2019, 4:49 PM

## 2019-09-07 NOTE — Progress Notes (Signed)
Report given to [place] at [facility]; [facility] to transport pt.

## 2019-09-07 NOTE — Discharge Summary (Signed)
Name: Duane [name] MRN: [id number] DOB: September 04, 1935 84 y.o. PCP: [name], MD  Date of Admission: 09/05/2019  3:37 PM Date of Discharge:  Attending Physician: Velna Ochs, MD  Discharge Diagnosis: 1. Dehydration/ Hyponatremia  Discharge Medications: Allergies as of 09/07/2019      Reactions   Baclofen Other (See Comments)   Severe AMS, comatose, intubated   Bee Venom Anaphylaxis   Contrast Media [iodinated Diagnostic Agents] Anaphylaxis   Iodine-131 Anaphylaxis   Metrizamide Anaphylaxis   Metronidazole Anaphylaxis   Atorvastatin Other (See Comments)   Aching in legs   Lisinopril Cough      Medication List    STOP taking these medications   polyethylene glycol 17 g packet Commonly known as: MIRALAX / GLYCOLAX     TAKE these medications   acetaminophen 500 MG tablet Commonly known as: TYLENOL Take 1,000 mg by mouth every 6 (six) hours as needed for mild pain, fever or headache.   amoxicillin-clavulanate 875-125 MG tablet Commonly known as: Augmentin Take 1 tablet by mouth 2 (two) times daily for 5 days.   apixaban 5 MG Tabs tablet Commonly known as: ELIQUIS Take 5 mg by mouth 2 (two) times daily.   b complex vitamins tablet Take 1 tablet by mouth daily.   CALCIUM MAGNESIUM PO Take 1 tablet by mouth daily.   calcium-vitamin D 500-200 MG-UNIT tablet Commonly known as: OSCAL WITH D Take 1 tablet by mouth daily with breakfast.   clopidogrel 75 MG tablet Commonly known as: PLAVIX Take 75 mg by mouth daily.   diclofenac sodium 1 % Gel Commonly known as: VOLTAREN Apply 2 g topically daily as needed (For muscle pain).   EpiPen 2-Pak 0.3 mg/0.3 mL Soaj injection Generic drug: EPINEPHrine Inject 0.3 mg into the muscle once as needed (severe allergic reaction).   loperamide 2 MG capsule Commonly known as: IMODIUM Take 1-2 capsules (2-4 mg total) by mouth every 6 (six) hours as needed for diarrhea or loose stools.   losartan 100 MG tablet  Commonly known as: COZAAR Take 100 mg by mouth daily.   memantine 5 MG tablet Commonly known as: NAMENDA Take 5 mg by mouth daily.   metFORMIN 1000 MG tablet Commonly known as: GLUCOPHAGE Take 1,000 mg by mouth 2 (two) times daily with a meal.   metoprolol tartrate 50 MG tablet Commonly known as: LOPRESSOR Take 1 tablet (50 mg total) by mouth 2 (two) times daily.   nitroGLYCERIN 0.4 MG SL tablet Commonly known as: NITROSTAT Place 0.4 mg under the tongue every 5 (five) minutes as needed for chest pain.   Omega-3-6-9 Caps Take 2 capsules by mouth 2 (two) times daily.   OVER THE COUNTER MEDICATION Apply 1 application topically 2 (two) times daily as needed (pain). CBD Oil   pantoprazole 40 MG tablet Commonly known as: PROTONIX Take 1 tablet (40 mg total) by mouth daily.   pravastatin 40 MG tablet Commonly known as: PRAVACHOL Take 40 mg by mouth every evening.   PROBIOTIC PO Take 1 tablet by mouth daily.   saw palmetto 160 MG capsule Take 160 mg by mouth 2 (two) times daily.   sitaGLIPtin 25 MG tablet Commonly known as: JANUVIA Take 25 mg by mouth every evening.   sotalol 80 MG tablet Commonly known as: BETAPACE Take 80 mg by mouth 2 (two) times daily.   VITAMIN C PO Take 1,000 mg by mouth daily.       Disposition and follow-up:   [name] was  discharged from [facility] in Stable condition.  At the hospital follow up visit please address:  Dehydration AKI on CKD3. Improved with fluid replacement. Acute on chronic hyponatremia. Etiology of chronic aspect of hyponatremia unclear.  Urine osmolality 481.  Urine sodium 36.  Serum osmolality 274.  Serum sodium 128.  Likely consistent with hypovolemic hyponatremia  Urinary tract infection. UA + leukocytes and nitrites. UC + klebsiella. Received 2d course of rocephin during hospitalization. Discharged with 5d course of augmentin to be completed for total of 7d treatment.  Labs / imaging  needed at time of follow-up: recheck sodium.  Pending labs/ test needing follow-up: GI panel  Follow-up Appointments: MRI of L spine and hip as previously scheduled.  Contact information for follow-up providers    [name], MD Follow up.   Specialty: Internal Medicine Contact information: [address] [place] [address] [phone]        Einar Crow, MD .   Specialty: Cardiology Contact information: [place] [address] [phone]            Contact information for after-discharge care    Destination    [facility] .   Service: Skilled Nursing Contact information: [place] Hospital Course by problem list: This is an 84 year old male who presented to the [facility] ED for evaluation of presumed dehydration.  On arrival to the ED, patient was noted to be severely hyponatremic--sodium 120.  Sodium improved over his short hospitalization with IV hydration and was 132 at discharge.  Serum and urine osmolalities and sodium levels were obtained.  Consistent with hypovolemic hyponatremia.  UA on admission was significant for leukocytes, nitrates and bacteria.  Urine culture significant for Klebsiella.  Patient completed 2-day course of Rocephin during his hospitalization and is discharged with instructions to continue Augmentin for 5 days for a total of a 7-day course.  He had also initially complained of profuse watery stools for an unknown amount of time.  C. difficile and GI pathogen pane were obtained. further discussion during his hospitalization and discussion with patient's son suggests that this was not the case.  C. difficile was found to be negative.  GI pathogen panel pending at discharge  Discharge Vitals:   BP 112/63 (BP Location: Right Arm)   Pulse (!) 59   Temp 97.8 F (36.6 C) (Oral)   Resp 16    SpO2 96%   Pertinent Labs, Studies, and Procedures:  C. difficile negative GI pathogen panel pending at time of discharge  Discharge Instructions: Follow-up with PCP within 7 days of discharge with repeat BMP.  Continue Augmentin for 5 days. Discharge Instructions    Diet - low sodium heart healthy   Complete by: As directed       Signed: Mitzi Hansen, MD 09/07/2019, 3:42 PM   Pager: [phone]

## 2019-09-07 NOTE — Evaluation (Addendum)
Occupational Therapy Evaluation Patient Details Name: Duane [name] MRN: [id number] DOB: 03-29-1935 Today's Date: 09/07/2019    History of Present Illness 84 year old male admitted with diarrhea and weakness, but overlapping with an admission last month for toxic metabolic encephalopathy with intubation.  Functional and mental decline recently per family.  PMHx:  HTN, DM, CKD 3, CAD, PCI, a-fib, B hip OA, MI, angina, dementia, PN,    Clinical Impression   Patient is an 84 year old male that has transitioned from [name] to assistant living due to decreased mobility and dementia. 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 Pt does have rolling walker in addition to his power wheelchair however unclear how long it has been since ambulating at the ALF. Spoke with Dr. Darrick Meigs regarding pending R hip MRI, verbally cleared patient to be up with therapy on 09/07/19.     Follow Up Recommendations  Home health OT;Supervision/Assistance - 24 hour    Equipment Recommendations  None recommended by OT       Precautions / Restrictions Precautions Precautions: Fall Precaution Comments: pt reports R leg "gives out" on him Restrictions Weight Bearing Restrictions: No      Mobility Bed Mobility Overal bed mobility: Needs Assistance Bed Mobility: Supine to Sit     Supine to sit: Mod assist     General bed mobility comments: mod to sequence for body mechanics  Transfers Overall transfer level: Needs assistance Equipment used: Rolling walker (2 wheeled) Transfers: Sit to/from Stand Sit to Stand: Min assist;From elevated surface         General transfer comment: cued for hand placement and set up    Balance Overall balance assessment: History of Falls;Needs assistance Sitting-balance support: Feet supported Sitting balance-Leahy Scale: Fair      Standing balance support: Bilateral upper extremity supported;During functional activity Standing balance-Leahy Scale: Poor Standing balance comment: light walker support, correction for posture                           ADL either performed or assessed with clinical judgement   ADL Overall ADL's : Needs assistance/impaired Eating/Feeding: Independent   Grooming: Supervision/safety;Set up;Sitting   Upper Body Bathing: Sitting;Supervision/ safety;Set up   Lower Body Bathing: Sit to/from stand;Min guard   Upper Body Dressing : Set up;Supervision/safety;Sitting   Lower Body Dressing: Sit to/from stand;Min guard   Toilet Transfer: Minimal assistance;Ambulation;Comfort height toilet;RW   Toileting- Clothing Manipulation and Hygiene: Sit to/from stand;Min guard                            Pertinent Vitals/Pain Pain Assessment: Faces Faces Pain Scale: Hurts little more Pain Location: R hip Pain Descriptors / Indicators: Guarding Pain Intervention(s): Limited activity within patient's tolerance;Monitored during session;Repositioned     Hand Dominance Right   Extremity/Trunk Assessment Upper Extremity Assessment Upper Extremity Assessment: Overall WFL for tasks assessed   Lower Extremity Assessment Lower Extremity Assessment: Generalized weakness   Cervical / Trunk Assessment Cervical / Trunk Assessment: Kyphotic   Communication Communication Communication: HOH(prefers R ear)   Cognition Arousal/Alertness: Awake/alert Behavior During Therapy: WFL for tasks assessed/performed Overall Cognitive Status: History of cognitive impairments - at baseline  General Comments: perseverates/tangential.    General Comments  pt has been cleared for mobility by MD, but is unsafe to move alone.  Will need staff assist in ALF and if not available should consider SNF care         Home Living Family/patient expects  to be discharged to:: Assisted living                             Home Equipment: Walker - 2 wheels;Wheelchair - power   Additional Comments: ALF from independent living now      Prior Functioning/Environment Level of Independence: Needs assistance  Gait / Transfers Assistance Needed: transfers only to power chair ADL's / Homemaking Assistance Needed: has assistance to get dressed and bathed and for meals in ALF            OT Problem List: Decreased activity tolerance;Decreased cognition;Decreased safety awareness;Pain;Impaired balance (sitting and/or standing)      OT Treatment/Interventions: Self-care/ADL training;Therapeutic exercise;Energy conservation;DME and/or AE instruction;Therapeutic activities;Cognitive remediation/compensation;Patient/family education;Balance training    OT Goals(Current goals can be found in the care plan section) Acute Rehab OT Goals Patient Stated Goal: to walk with son when he arrives Time For Goal Achievement: 09/21/19 ADL Goals Pt Will Perform Lower Body Dressing: with set-up;with supervision;sit to/from stand Pt Will Transfer to Toilet: ambulating;with supervision;grab bars(comfort height toilet, rolling walker) Pt Will Perform Toileting - Clothing Manipulation and hygiene: with supervision;sit to/from stand  OT Frequency: Min 2X/week          AM-PAC OT "6 Clicks" Daily Activity     Outcome Measure Help from another person eating meals?: None Help from another person taking care of personal grooming?: A Little Help from another person toileting, which includes using toliet, bedpan, or urinal?: A Little Help from another person bathing (including washing, rinsing, drying)?: A Little Help from another person to put on and taking off regular upper body clothing?: A Little Help from another person to put on and taking off regular lower body clothing?: A Little 6 Click Score: 19   End of Session Equipment Utilized During  Treatment: Gait belt;Rolling walker  Activity Tolerance:   Patient left:                     Time:  [id number]  OT time calculation (mins): 26 mins Charges: OT general charges $OT visit: 1 visit OT evaluation $OT eval moderate complexity: 1 Mod OT treatments $ self care/home management: 8-22 mins  Delbert Phenix OT OT office: [place] 09/07/2019, 3:24 PM

## 2019-09-07 NOTE — Progress Notes (Signed)
NAME:  Duane [name], MRN:  [id number], DOB:  May 09, 1935, LOS: 2 ADMISSION DATE:  09/05/2019, Primary: [name], MD  CHIEF COMPLAINT: Dehydration and diarrhea  Medical Service: Internal Medicine Teaching Service         Attending Physician: Dr. Velna Ochs, MD    First Contact: Dr. Darrick Meigs Pager: [phone]  Second Contact: Dr. Sharon Seller Pager: [phone]       After Hours (After 5p/  First Contact Pager: [phone]  weekends / holidays): Second Contact Pager: [phone]    Brief History  84 year old male with past medical history of hypertension, atrial fibrillation, type 2 diabetes, and dementia who resides at an assisted living facility.  Presented to the ED on 10/21 for evaluation of diarrhea and dehydration.  ED findings consistent with hyponatremia and UTI.  Patient was admitted for further evaluation and management.  Subjective  No complaints overnight.  Son present at bedside this morning. Patient denies watery stools since prior to admission.  Son endorses this and notes that he spoke to staff that the assisted living facility which patient resides and they also agreed that he has not been having watery stools.  Objective   Blood pressure 112/63, pulse (!) 59, temperature 97.8 F (36.6 C), temperature source Oral, resp. rate 16, SpO2 96 %.     Intake/Output Summary (Last 24 hours) at 09/07/2019 0939 Last data filed at 09/07/2019 0600 Gross per 24 hour  Intake 1389.59 ml  Output 1800 ml  Net -410.41 ml   Examination: GENERAL: in no acute distress CARDIAC: heart RRR. No peripheral edema.  PULMONARY: acyanotic. Lung sounds clear to auscultation. ABDOMEN: Bowel sounds active.  Soft. Nontender to palpation.  Nondistended. NEURO: Alert and oriented SKIN: no rash or lesions on limited exam   Significant Diagnostic Tests:  10/21 Chest x-ray: Negative for infiltrate or effusion.  Micro Data:  10/21 urine culture>> Klebsiella C. Difficile>> negative GI pathogen  panel>>  Antimicrobials:  Rocephin 10/21>>  Labs    CBC Latest Ref Rng & Units 09/06/2019 09/05/2019 07/26/2019  WBC 4.0 - 10.5 K/uL 8.8 9.2 9.2  Hemoglobin 13.0 - 17.0 g/dL 12.1(L) 11.3(L) 11.1(L)  Hematocrit 39.0 - 52.0 % 36.7(L) 33.7(L) 32.9(L)  Platelets 150 - 400 K/uL 237 224 217   BMP Latest Ref Rng & Units 09/07/2019 09/07/2019 09/07/2019  Glucose 70 - 99 mg/dL 141(H) 134(H) 153(H)  BUN 8 - 23 mg/dL 6(L) 7(L) 9  Creatinine 0.61 - 1.24 mg/dL 0.96 0.99 0.99  Sodium 135 - 145 mmol/L 132(L) 130(L) 128(L)  Potassium 3.5 - 5.1 mmol/L 3.8 3.7 3.5  Chloride 98 - 111 mmol/L 100 99 97(L)  CO2 22 - 32 mmol/L 22 22 22   Calcium 8.9 - 10.3 mg/dL 8.8(L) 8.7(L) 8.8(L)   UA + leukocytes, nitrites, rare bacteria   Summary  84 year old male admitted on 10/21 for dehydration/AKI , hyponatremia, and urinary tract infection.  Assessment & Plan:  Principal Problem:   Hyponatremia Active Problems:   Diarrhea   UTI (urinary tract infection)  Dehydration/AKI on CKD 3.  Baseline creatinine around 1.  1.3 on admission.  Resolved as of this morning. Hypovolemic hyponatremia.  Sodium 120 on admission.  Baseline sodium appears to be in the low 130s.   Improved with IV fluids and now 132 this morning.  Urine studies consistent with hypovolemic hyponatremia Plan Plan for discharge today  Urinary tract infection.  Afebrile.  UA on admission significant for leukocytes, nitrites and bacteria.  Urine culture positive for Klebsiella Plan We will discharge  today on Augmentin to complete another 5 days for total of 7-day course of antibiotic treatment  Chronic right hip pain with associated right lower extremity weakness.  Follows with Ortho for this.  Patient scheduled for MRI of lumbar spine and right hip to rule out nerve compression and hip fracture. Plan PT/OT  Type 2 diabetes.  Last A1c 6.3 on 07/2019.  Continue sliding scale insulin and CBGs.  Diabetic diet  Dementia.  Continue memantine.  Atrial fibrillation.  Continue Eliquis and sotalol Coronary artery disease status post PCI.  Continue Plavix and metoprolol  Best practice:  CODE STATUS: Full Diet: Diabetic Glucose control: Sliding scale insulin and CBGs DVT for prophylaxis: Eliquis Dispo: Discharge today back to assisted living facility.  Follow-up with PCP in 5 to 7 days.  Discharged with instructions to complete 5-day course of Augmentin for urinary tract infection.  Mitzi Hansen, MD INTERNAL MEDICINE RESIDENT PGY-1 PAGER #: [phone] 09/07/19 9:39 AM

## 2019-09-07 NOTE — Evaluation (Signed)
Physical Therapy Evaluation Patient Details Name: Duane [name] MRN: [id number] DOB: 01-Apr-1935 Today's Date: 09/07/2019   History of Present Illness  84 year old male admitted with diarrhea and weakness, but overlapping with an admission last month for toxic metabolic encephalopathy with intubation.  Functional and mental decline recently per family.  PMHx:  HTN, DM, CKD 3, CAD, PCI, a-fib, B hip OA, MI, angina, dementia, PN,   Clinical Impression  Pt was seen for mobility and for control of balance with RW.  Pt was not doing any functional walking for the last few weeks per his report, just going bed to power chair.  His plan is to do a lot of walking before leaving the hospital and so will follow him with PT to increase mobility as tolerated.  Will anticipate staff helping him more initially then transitioning to less as he becomes safer.  Home therapy to follow for strengthening and endurance training to restore prior functional level.    Follow Up Recommendations Home health PT;Supervision/Assistance - 24 hour;Supervision for mobility/OOB    Equipment Recommendations  Rolling walker with 5" wheels(if his walker is not in good condition)    Recommendations for Other Services       Precautions / Restrictions Precautions Precautions: Fall Precaution Comments: pt reports R leg "gives out" on him Restrictions Weight Bearing Restrictions: No      Mobility  Bed Mobility Overal bed mobility: Needs Assistance Bed Mobility: Supine to Sit     Supine to sit: Mod assist     General bed mobility comments: mod to sequence for body mechanics  Transfers Overall transfer level: Needs assistance Equipment used: Rolling walker (2 wheeled) Transfers: Sit to/from Stand Sit to Stand: Min assist;From elevated surface         General transfer comment: cued for hand placement and set up  Ambulation/Gait Ambulation/Gait assistance: Min assist Gait Distance (Feet): 35 Feet Assistive  device: Rolling walker (2 wheeled) Gait Pattern/deviations: Step-through pattern;Decreased stride length;Trunk flexed;Wide base of support Gait velocity: reduced   General Gait Details: pt is unable to maneuver well in a confined space, discussed the need to avoid being out on the hall due to his RLE being undependable and precautions for GI symptoms  Stairs            Wheelchair Mobility    Modified Rankin (Stroke Patients Only)       Balance Overall balance assessment: History of Falls;Needs assistance Sitting-balance support: Feet supported Sitting balance-Leahy Scale: Fair     Standing balance support: Bilateral upper extremity supported;During functional activity Standing balance-Leahy Scale: Poor Standing balance comment: light walker support, correction for posture                             Pertinent Vitals/Pain Pain Assessment: Faces Pain Score: 3  Faces Pain Scale: Hurts little more Pain Location: R hip Pain Descriptors / Indicators: Guarding Pain Intervention(s): Limited activity within patient's tolerance;Monitored during session;Repositioned    Home Living Family/patient expects to be discharged to:: Assisted living               Home Equipment: Walker - 2 wheels;Wheelchair - power Additional Comments: ALF from independent living now    Prior Function Level of Independence: Needs assistance   Gait / Transfers Assistance Needed: transfers only to power chair  ADL's / Homemaking Assistance Needed: has assistance to get dressed and bathed and for meals in ALF  Hand Dominance   Dominant Hand: Right    Extremity/Trunk Assessment   Upper Extremity Assessment Upper Extremity Assessment: Overall WFL for tasks assessed    Lower Extremity Assessment Lower Extremity Assessment: Generalized weakness    Cervical / Trunk Assessment Cervical / Trunk Assessment: Kyphotic  Communication   Communication: HOH(prefers R ear)   Cognition Arousal/Alertness: Awake/alert Behavior During Therapy: WFL for tasks assessed/performed Overall Cognitive Status: History of cognitive impairments - at baseline                                 General Comments: repetitive conversation about fluid intake and son's arrival      General Comments General comments (skin integrity, edema, etc.): pt has been cleared for mobility by MD, but is unsafe to move alone.  Will need staff assist in ALF and if not available should consider SNF care    Exercises     Assessment/Plan    PT Assessment Patient needs continued PT services  PT Problem List Decreased strength;Decreased range of motion;Decreased activity tolerance;Decreased balance;Decreased mobility;Decreased coordination;Decreased cognition;Decreased knowledge of use of DME;Decreased safety awareness;Cardiopulmonary status limiting activity;Pain       PT Treatment Interventions DME instruction;Gait training;Functional mobility training;Therapeutic activities;Neuromuscular re-education;Balance training;Therapeutic exercise;Patient/family education    PT Goals (Current goals can be found in the Care Plan section)  Acute Rehab PT Goals Patient Stated Goal: to walk with son when he arrives PT Goal Formulation: Patient unable to participate in goal setting Time For Goal Achievement: 09/21/19 Potential to Achieve Goals: Good    Frequency Min 3X/week   Barriers to discharge Decreased caregiver support will have more assist than [place] but not as much as SNF    Co-evaluation               AM-PAC PT "6 Clicks" Mobility  Outcome Measure Help needed turning from your back to your side while in a flat bed without using bedrails?: A Little Help needed moving from lying on your back to sitting on the side of a flat bed without using bedrails?: A Little Help needed moving to and from a bed to a chair (including a wheelchair)?: A Little Help needed standing up from a  chair using your arms (e.g., wheelchair or bedside chair)?: A Little Help needed to walk in hospital room?: A Little Help needed climbing 3-5 steps with a railing? : Total 6 Click Score: 16    End of Session Equipment Utilized During Treatment: Gait belt Activity Tolerance: Patient tolerated treatment well;Patient limited by fatigue Patient left: with call bell/phone within reach;in chair;with chair alarm set;with nursing/sitter in room Nurse Communication: Mobility status PT Visit Diagnosis: Unsteadiness on feet (R26.81);Muscle weakness (generalized) (M62.81);History of falling (Z91.81);Difficulty in walking, not elsewhere classified (R26.2);Pain Pain - Right/Left: Right Pain - part of body: Hip    Time: [id number] PT Time Calculation (min) (ACUTE ONLY): 39 min   Charges:   PT Evaluation $PT Eval Moderate Complexity: 1 Mod PT Treatments $Therapeutic Exercise: 8-22 mins $Therapeutic Activity: 8-22 mins       Ramond Dial 09/07/2019, 12:46 PM   Mee Hives, PT MS Acute Rehab Dept. Number: [place] [facility] [place]

## 2019-09-07 NOTE — Plan of Care (Signed)
  Problem: Activity: Goal: Risk for activity intolerance will decrease Outcome: Progressing   

## 2019-09-10 LAB — GI PATHOGEN PANEL BY PCR, STOOL

## 2019-09-10 NOTE — TOC Transition Note (Signed)
Transition of Care [facility]) - CM/SW Discharge Note   Patient Details  Name: Duane [name] MRN: [id number] Date of Birth: 17-Oct-1935  [facility] CM/SW Contact:  Bartholomew Crews, RN Phone Number: [phone] 09/10/2019, 11:30 AM   Clinical Narrative:    Received call from [facility] with authorization. Reference ID# [id number]; [facility] authorization # [id number] - with 3 days approval beginning today; next review 10/28; care coordinator- Nelly Laurence; fax # [phone]. Spoke with Luellen Pucker at [facility] to provide information.    Final next level of care: Skilled Nursing Facility Barriers to Discharge: No Barriers Identified   Patient Goals and CMS Choice Patient states their goals for this hospitalization and ongoing recovery are:: Pt will go to [facility] CMS Medicare.gov Compare Post Acute Care list provided to:: (NA) Choice offered to / list presented to : NA  Discharge Placement   Existing PASRR number confirmed : 09/07/19          Patient chooses bed at: [facility] Patient to be transferred to facility by: [place] Name of family member notified: Chip Patient and family notified of of transfer: 09/07/19  Discharge Plan and Services In-house Referral: Clinical Social Work Discharge Planning Services: NA Post Acute Care Choice: [facility]          DME Arranged: N/A DME Agency: NA       HH Arranged: NA HH Agency: NA        Social Determinants of Health (SDOH) Interventions     Readmission Risk Interventions No flowsheet data found.

## 2019-09-12 ENCOUNTER — Ambulatory Visit: Payer: Medicare Other | Admitting: Orthopaedic Surgery

## 2019-09-20 ENCOUNTER — Ambulatory Visit: Payer: Medicare Other | Admitting: Orthopaedic Surgery

## 2019-10-15 ENCOUNTER — Telehealth: Payer: Self-pay | Admitting: *Deleted

## 2019-10-15 NOTE — Telephone Encounter (Signed)
Received voice mail from [facility] asking if can get pt rescheduled for MRI Right hip, I called ashley back left vm stating pt can get reschedule and to contact [facility] to get pt scheduled, [facility] number was given.

## 2019-11-04 ENCOUNTER — Other Ambulatory Visit: Payer: Self-pay

## 2019-11-04 ENCOUNTER — Emergency Department (HOSPITAL_COMMUNITY): Payer: Medicare Other

## 2019-11-04 ENCOUNTER — Encounter (HOSPITAL_COMMUNITY): Payer: Self-pay

## 2019-11-04 ENCOUNTER — Emergency Department (HOSPITAL_COMMUNITY)
Admission: EM | Admit: 2019-11-04 | Discharge: 2019-11-04 | Disposition: A | Discharge status: Home / Self Care | Payer: Medicare Other | Attending: Emergency Medicine | Admitting: Emergency Medicine

## 2019-11-04 DIAGNOSIS — F0390 Unspecified dementia without behavioral disturbance: Secondary | ICD-10-CM | POA: Diagnosis not present

## 2019-11-04 DIAGNOSIS — Z7901 Long term (current) use of anticoagulants: Secondary | ICD-10-CM | POA: Insufficient documentation

## 2019-11-04 DIAGNOSIS — Z85828 Personal history of other malignant neoplasm of skin: Secondary | ICD-10-CM | POA: Diagnosis not present

## 2019-11-04 DIAGNOSIS — R2243 Localized swelling, mass and lump, lower limb, bilateral: Secondary | ICD-10-CM | POA: Diagnosis not present

## 2019-11-04 DIAGNOSIS — R0789 Other chest pain: Secondary | ICD-10-CM | POA: Diagnosis present

## 2019-11-04 DIAGNOSIS — E1122 Type 2 diabetes mellitus with diabetic chronic kidney disease: Secondary | ICD-10-CM | POA: Diagnosis not present

## 2019-11-04 DIAGNOSIS — I2510 Atherosclerotic heart disease of native coronary artery without angina pectoris: Secondary | ICD-10-CM | POA: Insufficient documentation

## 2019-11-04 DIAGNOSIS — Z7902 Long term (current) use of antithrombotics/antiplatelets: Secondary | ICD-10-CM | POA: Diagnosis not present

## 2019-11-04 DIAGNOSIS — N183 Chronic kidney disease, stage 3 unspecified: Secondary | ICD-10-CM | POA: Diagnosis not present

## 2019-11-04 DIAGNOSIS — I129 Hypertensive chronic kidney disease with stage 1 through stage 4 chronic kidney disease, or unspecified chronic kidney disease: Secondary | ICD-10-CM | POA: Diagnosis not present

## 2019-11-04 DIAGNOSIS — Z7984 Long term (current) use of oral hypoglycemic drugs: Secondary | ICD-10-CM | POA: Insufficient documentation

## 2019-11-04 DIAGNOSIS — R079 Chest pain, unspecified: Secondary | ICD-10-CM

## 2019-11-04 DIAGNOSIS — Z955 Presence of coronary angioplasty implant and graft: Secondary | ICD-10-CM | POA: Diagnosis not present

## 2019-11-04 DIAGNOSIS — Z79899 Other long term (current) drug therapy: Secondary | ICD-10-CM | POA: Diagnosis not present

## 2019-11-04 DIAGNOSIS — U071 COVID-19: Secondary | ICD-10-CM | POA: Diagnosis not present

## 2019-11-04 DIAGNOSIS — R451 Restlessness and agitation: Secondary | ICD-10-CM | POA: Diagnosis not present

## 2019-11-04 LAB — CBC WITH DIFFERENTIAL/PLATELET
Abs Immature Granulocytes: 0.04 10*3/uL (ref 0.00–0.07)
Basophils Absolute: 0.1 10*3/uL (ref 0.0–0.1)
Basophils Relative: 1 %
Eosinophils Absolute: 0.3 10*3/uL (ref 0.0–0.5)
Eosinophils Relative: 4 %
HCT: 33 % — ABNORMAL LOW (ref 39.0–52.0)
Hemoglobin: 10.5 g/dL — ABNORMAL LOW (ref 13.0–17.0)
Immature Granulocytes: 1 %
Lymphocytes Relative: 38 %
Lymphs Abs: 2.6 10*3/uL (ref 0.7–4.0)
MCH: 27.1 pg (ref 26.0–34.0)
MCHC: 31.8 g/dL (ref 30.0–36.0)
MCV: 85.3 fL (ref 80.0–100.0)
Monocytes Absolute: 0.8 10*3/uL (ref 0.1–1.0)
Monocytes Relative: 11 %
Neutro Abs: 3.1 10*3/uL (ref 1.7–7.7)
Neutrophils Relative %: 45 %
Platelets: 229 10*3/uL (ref 150–400)
RBC: 3.87 MIL/uL — ABNORMAL LOW (ref 4.22–5.81)
RDW: 13.8 % (ref 11.5–15.5)
WBC: 6.8 10*3/uL (ref 4.0–10.5)
nRBC: 0 % (ref 0.0–0.2)

## 2019-11-04 LAB — BASIC METABOLIC PANEL
Anion gap: 9 (ref 5–15)
BUN: 10 mg/dL (ref 8–23)
CO2: 24 mmol/L (ref 22–32)
Calcium: 8.7 mg/dL — ABNORMAL LOW (ref 8.9–10.3)
Chloride: 99 mmol/L (ref 98–111)
Creatinine, Ser: 1.09 mg/dL (ref 0.61–1.24)
GFR calc Af Amer: >60 mL/min (ref >60)
GFR calc non Af Amer: >60 mL/min (ref >60)
Glucose, Bld: 169 mg/dL — ABNORMAL HIGH (ref 70–99)
Potassium: 4 mmol/L (ref 3.5–5.1)
Sodium: 132 mmol/L — ABNORMAL LOW (ref 135–145)

## 2019-11-04 LAB — SARS CORONAVIRUS 2 (TAT 6-24 HRS): SARS Coronavirus 2: POSITIVE — AB

## 2019-11-04 LAB — TROPONIN I (HIGH SENSITIVITY)
Troponin I (High Sensitivity): 10 ng/L (ref <18)
Troponin I (High Sensitivity): 13 ng/L (ref <18)

## 2019-11-04 IMAGING — DX DG CHEST 1V PORT
1 series · 1 of 1 positions shown · non-contrast
Comparison: [DATE]

CLINICAL DATA: Chest pain

EXAM:
PORTABLE CHEST 1 VIEW

[chest ap]
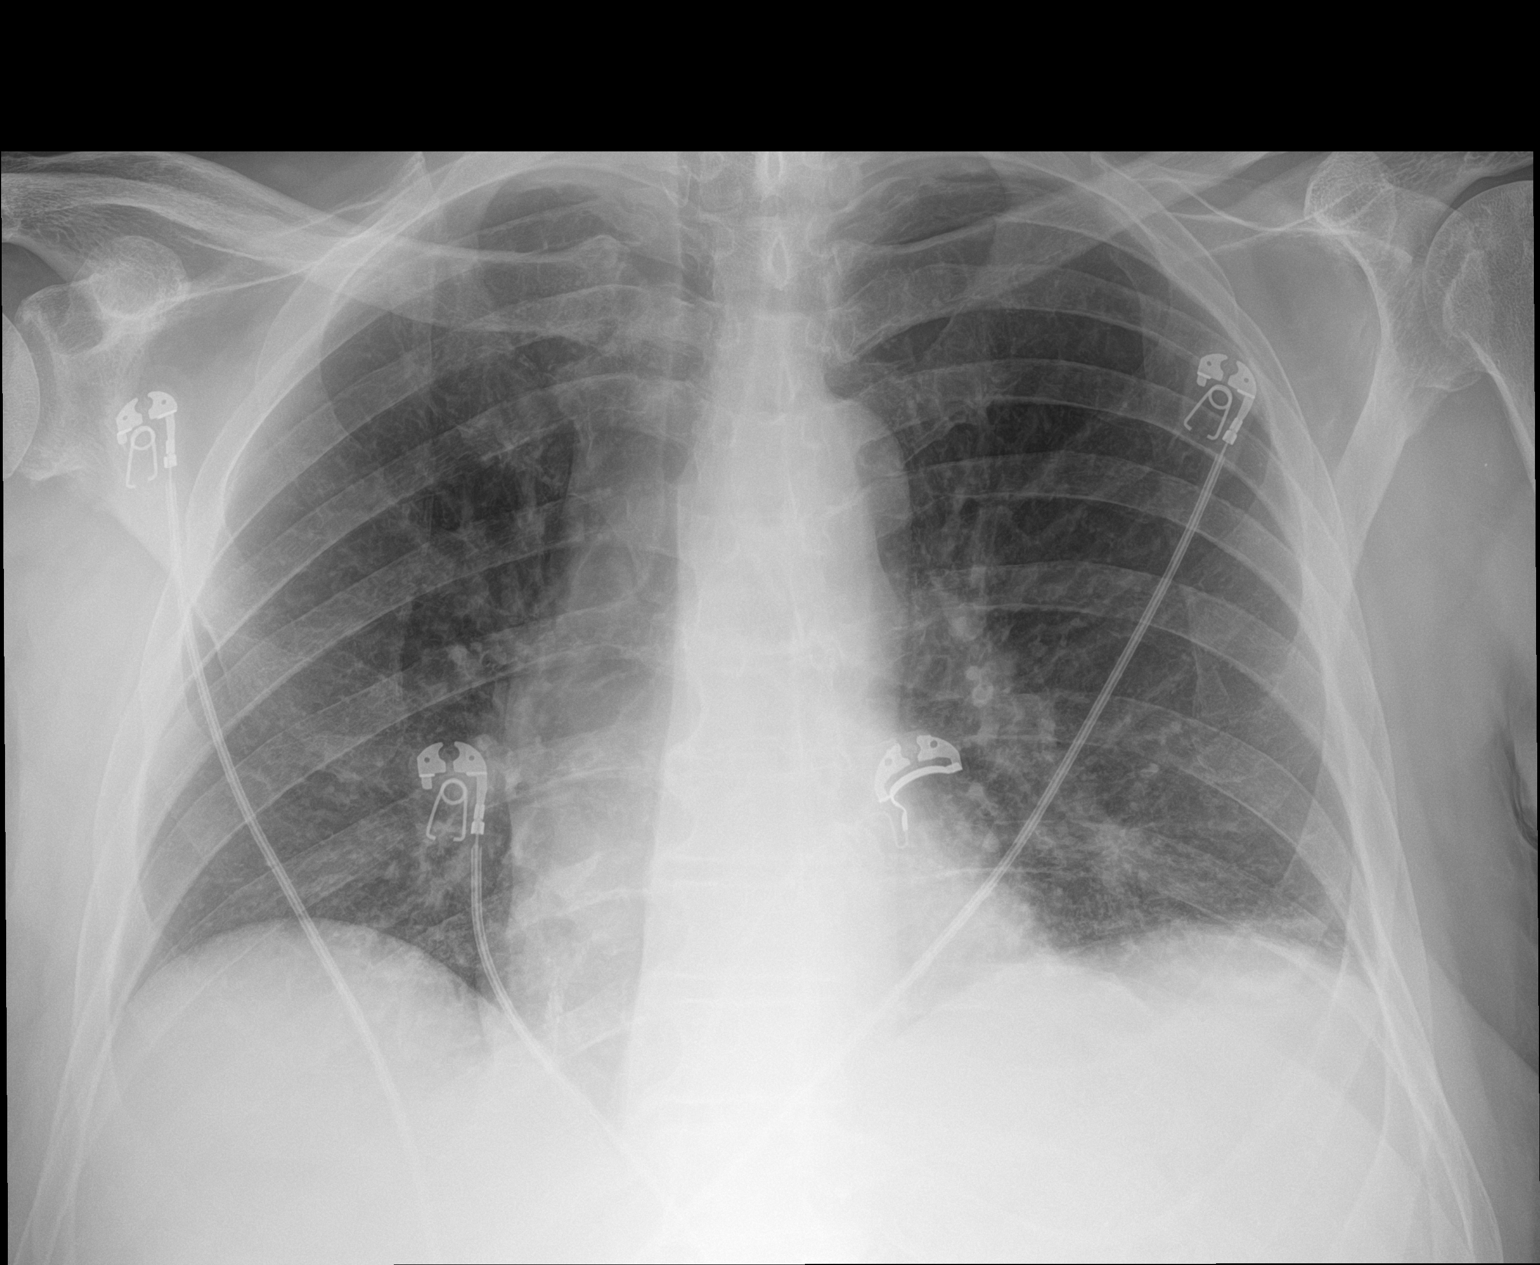

[1 of 1 positions shown; findings below may reference images not displayed]

FINDINGS: Cardiomediastinal contours, visualized portions are unremarkable,
partially obscured due to low lung volumes.

Linear opacities over the left hemidiaphragm.

No signs of pleural effusion.

No acute bone process.
IMPRESSION: Low volume chest likely with basilar atelectasis. No consolidation
or effusion.

## 2019-11-04 NOTE — ED Notes (Addendum)
Son called while in the room, pt stated he could not remember the number and wanted this NT to write it down. Pts son informed this NT that he would like a call when transported back to facility. [phone] Byrd Hesselbach- Son). Number was also given to call [facility] for transportation, given to [employer].

## 2019-11-04 NOTE — ED Notes (Signed)
EDP at bedside  

## 2019-11-04 NOTE — ED Notes (Signed)
Pt given diet ginger ale and water.

## 2019-11-04 NOTE — ED Provider Notes (Signed)
[facility] EMERGENCY DEPARTMENT Provider Note   CSN: [id number] Arrival date & time: 11/04/19  1522     History Chief Complaint  Patient presents with  . Chest Pain    Duane [name] is a 84 y.o. male.  The history is provided by the patient, the nursing home and a relative.  Chest Pain Pain location:  Substernal area Pain quality: shooting and tightness   Pain radiates to:  Does not radiate Pain severity:  Moderate Onset quality:  Gradual Duration:  3 hours Timing:  Intermittent Progression:  Resolved Chronicity:  New Context comment:  Started at rest Relieved by:  Aspirin and nitroglycerin Exacerbated by: unknown. Ineffective treatments:  None tried Associated symptoms: shortness of breath   Associated symptoms: no abdominal pain, no cough, no fever, no nausea and no vomiting   Risk factors: coronary artery disease, diabetes mellitus and hypertension   Risk factors comment:  Afib on eliquis      Past Medical History:  Diagnosis Date  . A-fib ([place])   . Anemia   . Angina pectoris ([place])   . Arthritis   . Carpal tunnel syndrome   . Cobalamin deficiency   . Coronary artery disease   . Diabetes mellitus without complication ([place])   . Eczema   . Hyperlipemia   . Hypertension   . Kidney disease, chronic, stage III (moderate, EGFR 30-59 ml/min)   . Lung infiltrate   . Myocardial infarct, old   . Prostatic hypertrophy   . Sleep apnea, obstructive   . Squamous cell cancer of lip     Patient Active Problem List   Diagnosis Date Noted  . Hyponatremia 09/05/2019  . Diarrhea 09/05/2019  . UTI (urinary tract infection) 09/05/2019  . Encephalopathy 07/23/2019  . Essential hypertension 09/08/2016  . PAF (paroxysmal atrial fibrillation) ([place]) 09/08/2016  . Diabetes mellitus type 2, controlled ([place]) 09/08/2016  . ARF (acute renal failure) ([place]) 09/08/2016  . Normochromic normocytic anemia 09/08/2016  . Abdominal pain 09/08/2016    Past  Surgical History:  Procedure Laterality Date  . CORONARY ANGIOPLASTY WITH STENT PLACEMENT         Family History  Problem Relation Age of Onset  . Hypertension Other     Social History   Tobacco Use  . Smoking status: Never Smoker  . Smokeless tobacco: Never Used  Substance Use Topics  . Alcohol use: No  . Drug use: Not on file    Home Medications Prior to Admission medications   Medication Sig Start Date End Date Taking? Authorizing Provider  acetaminophen (TYLENOL) 500 MG tablet Take 1,000 mg by mouth every 6 (six) hours as needed for mild pain, fever or headache.    [provider]  apixaban (ELIQUIS) 5 MG TABS tablet Take 5 mg by mouth 2 (two) times daily.    [provider]  Ascorbic Acid (VITAMIN C PO) Take 1,000 mg by mouth daily.     [provider]  b complex vitamins tablet Take 1 tablet by mouth daily.    [provider]  Calcium-Magnesium-Vitamin D (CALCIUM MAGNESIUM PO) Take 1 tablet by mouth daily.     [provider]  calcium-vitamin D (OSCAL WITH D) 500-200 MG-UNIT tablet Take 1 tablet by mouth daily with breakfast.    [provider]  clopidogrel (PLAVIX) 75 MG tablet Take 75 mg by mouth daily.    [provider]  diclofenac sodium (VOLTAREN) 1 % GEL Apply 2 g topically daily as  needed (For muscle pain).     [provider]  EPINEPHrine (EPIPEN 2-PAK) 0.3 mg/0.3 mL IJ SOAJ injection Inject 0.3 mg into the muscle once as needed (severe allergic reaction).    [provider]  loperamide (IMODIUM) 2 MG capsule Take 1-2 capsules (2-4 mg total) by mouth every 6 (six) hours as needed for diarrhea or loose stools. 09/07/19   Duane Hansen, MD  losartan (COZAAR) 100 MG tablet Take 100 mg by mouth daily.    [provider]  memantine (NAMENDA) 5 MG tablet Take 5 mg by mouth daily.    [provider]  metFORMIN (GLUCOPHAGE) 1000 MG tablet Take 1,000 mg by mouth 2 (two)  times daily with a meal. 07/05/16   [provider]  metoprolol tartrate (LOPRESSOR) 50 MG tablet Take 1 tablet (50 mg total) by mouth 2 (two) times daily. Patient not taking: Reported on 09/06/2019 07/27/19   Hosie Poisson, MD  nitroGLYCERIN (NITROSTAT) 0.4 MG SL tablet Place 0.4 mg under the tongue every 5 (five) minutes as needed for chest pain.    [provider]  Omega-3-6-9 CAPS Take 2 capsules by mouth 2 (two) times daily.     [provider]  OVER THE COUNTER MEDICATION Apply 1 application topically 2 (two) times daily as needed (pain). CBD Oil    [provider]  pantoprazole (PROTONIX) 40 MG tablet Take 1 tablet (40 mg total) by mouth daily. 07/28/19   Hosie Poisson, MD  pravastatin (PRAVACHOL) 40 MG tablet Take 40 mg by mouth every evening. 08/23/19   [provider]  Probiotic Product (PROBIOTIC PO) Take 1 tablet by mouth daily.    [provider]  saw palmetto 160 MG capsule Take 160 mg by mouth 2 (two) times daily.    [provider]  sitaGLIPtin (JANUVIA) 25 MG tablet Take 25 mg by mouth every evening.    [provider]  sotalol (BETAPACE) 80 MG tablet Take 80 mg by mouth 2 (two) times daily.    [provider]    Allergies    Baclofen, Bee venom, Contrast media [iodinated diagnostic agents], Iodine-131, Metrizamide, Metronidazole, Atorvastatin, and Lisinopril  Review of Systems   Review of Systems  Constitutional: Negative for fever.  Respiratory: Positive for shortness of breath. Negative for cough.   Cardiovascular: Positive for chest pain.  Gastrointestinal: Negative for abdominal pain, nausea and vomiting.  All other systems reviewed and are negative.   Physical Exam Updated Vital Signs BP (!) 178/69 (BP Location: Right Arm)   Pulse 67   Temp 97.6 F (36.4 C) (Oral)   Resp 16   SpO2 100%   Physical Exam Vitals and nursing note reviewed.  Constitutional:      General: He is not in  acute distress.    Appearance: He is well-developed.  HENT:     Head: Normocephalic and atraumatic.  Eyes:     Conjunctiva/sclera: Conjunctivae normal.     Pupils: Pupils are equal, round, and reactive to light.  Cardiovascular:     Rate and Rhythm: Normal rate and regular rhythm.     Pulses: Normal pulses.     Heart sounds: Normal heart sounds. No murmur.  Pulmonary:     Effort: Pulmonary effort is normal. No respiratory distress.     Breath sounds: Normal breath sounds. No wheezing or rales.  Abdominal:     General: There is no distension.     Palpations: Abdomen is soft.     Tenderness:  There is no abdominal tenderness. There is no guarding or rebound.  Musculoskeletal:        General: No tenderness. Normal range of motion.     Cervical back: Normal range of motion and neck supple.     Right lower leg: Edema present.     Left lower leg: Edema present.  Skin:    General: Skin is warm and dry.     Findings: No erythema or rash.  Neurological:     General: No focal deficit present.     Mental Status: He is alert.     Comments: Oriented to person and place  Psychiatric:        Mood and Affect: Mood normal.        Behavior: Behavior normal.     ED Results / Procedures / Treatments   Labs (all labs ordered are listed, but only abnormal results are displayed) Labs Reviewed  CBC WITH DIFFERENTIAL/PLATELET - Abnormal; Notable for the following components:      Result Value   RBC 3.87 (*)    Hemoglobin 10.5 (*)    HCT 33.0 (*)    All other components within normal limits  BASIC METABOLIC PANEL - Abnormal; Notable for the following components:   Sodium 132 (*)    Glucose, Bld 169 (*)    Calcium 8.7 (*)    All other components within normal limits  SARS CORONAVIRUS 2 (TAT 6-24 HRS)  TROPONIN I (HIGH SENSITIVITY)  TROPONIN I (HIGH SENSITIVITY)  TROPONIN I (HIGH SENSITIVITY)  TROPONIN I (HIGH SENSITIVITY)    EKG EKG Interpretation  Date/Time:  Sunday November 04 2019  15:34:07 EST Ventricular Rate:  70 PR Interval:    QRS Duration: 106 QT Interval:  427 QTC Calculation: 461 R Axis:   -10 Text Interpretation: Sinus rhythm RSR' in V1 or V2, right VCD or RVH Repol abnrm suggests ischemia, lateral leads Minimal ST elevation, inferior leads No significant change since last tracing Confirmed by Blanchie Dessert [phone]) on 11/04/2019 3:47:41 PM   Radiology DG Chest Port 1 View  Result Date: 11/04/2019 CLINICAL DATA:  Chest pain EXAM: PORTABLE CHEST 1 VIEW COMPARISON:  09/05/2019 FINDINGS: Cardiomediastinal contours, visualized portions are unremarkable, partially obscured due to low lung volumes. Linear opacities over the left hemidiaphragm. No signs of pleural effusion. No acute bone process. IMPRESSION: Low volume chest likely with basilar atelectasis. No consolidation or effusion. Electronically Signed   By: Zetta Bills M.D.   On: 11/04/2019 16:28    Procedures Procedures (including critical care time)  Medications Ordered in ED Medications - No data to display  ED Course  I have reviewed the triage vital signs and the nursing notes.  Pertinent labs & imaging results that were available during my care of the patient were reviewed by me and considered in my medical decision making (see chart for details).    MDM Rules/Calculators/A&P                      Elderly male with mmp presenting today with CP concerning for ACS that started earlier today.  Nurse at assisted living gave report due to pt having dementia and cannot remember what happened.  She reported chest pain, he appeared SOB and agitated.  He received ASA 37m and 2 NTG and initially did not seem to work but on arrival here pt is pain free.  Son states pt called him today around 10 and was c/o of waxing and waning pain.  Pt with hx of 8 stents.  He had been quaretined earlier this month due to PT with COVID but he has tested neg and had no sx.  Pt has been doing well with no infectious  sx lately and no recent med changes.  Most recent cath on July 2020 recommending medical management but RCA stent in June 2020.  Echo with EF of 55-60% and currently on eliquis, plavix, sotalol and losartan.   Will r/o ACS.  Labs pending and pt currently symptom free.  5:27 PM Initial EKG, chest x-ray and lab work are reassuring.  Initial troponin is 10 and will wait for a delta Trope.  Patient continues to remain symptom-free.  He did get up and walk to the bathroom and did not develop any new symptoms.  8:21 PM Patient's delta troponin is 13 which is only 3 points different than his initial 1 of 10.  This seems to be patient's baseline.  Speaking with patient's son and the patient he would prefer to go home and if the pain becomes more prevalent he will return to the ER or follow-up with his cardiologist. Final Clinical Impression(s) / ED Diagnoses Final diagnoses:  Nonspecific chest pain    Rx / DC Orders ED Discharge Orders    None       Blanchie Dessert, MD 11/04/19 2023

## 2019-11-04 NOTE — ED Notes (Signed)
Transportation driver from [employer] is on the way to pick up pt. He will call when he arrives.

## 2019-11-04 NOTE — ED Notes (Addendum)
Portable Xray at bedside.

## 2019-11-04 NOTE — ED Notes (Signed)
Per pt son called facility for transportation.

## 2019-11-04 NOTE — Discharge Instructions (Signed)
If you start having more regular chest pain or the pain is severe like it was today and will not go away and takes multiple nitroglycerin you need to return to the hospital.  If not it is important that you either follow-up with your regular doctor or the cardiologist.

## 2019-11-04 NOTE — ED Notes (Signed)
Lt blue and dr green tubes labeled and saved at bedside.

## 2019-11-04 NOTE — ED Notes (Signed)
Pt given ED happy meal and cup of water per RN, Benjamine Mola T.

## 2019-11-04 NOTE — ED Triage Notes (Signed)
[facility] reports pt began having CP at 1330 today and took 324 mg ASA and 2 NTG PTA. Pt then reported 0/10 CP. Pt has hx of multiple stents and dementia. BP 152/80.

## 2019-11-04 NOTE — ED Notes (Signed)
Notified EDP of pt bp becoming more elevated.

## 2019-11-06 ENCOUNTER — Telehealth (HOSPITAL_COMMUNITY): Payer: Self-pay

## 2019-12-04 ENCOUNTER — Telehealth: Payer: Self-pay | Admitting: *Deleted

## 2019-12-04 NOTE — Telephone Encounter (Signed)
I received call on vm from Ms. Carpenter with [employer] stating wanting to get pt rescheduled for MRI with [facility] and left me a return number of [phone], I called back and number just kept ringing no vm. Will try again later. If Ms. Terence Lux calls back she can contact  Imaging at [phone] to reschedule the MRI

## 2019-12-05 ENCOUNTER — Other Ambulatory Visit: Payer: Self-pay | Admitting: Orthopaedic Surgery

## 2019-12-05 DIAGNOSIS — S72001G Fracture of unspecified part of neck of right femur, subsequent encounter for closed fracture with delayed healing: Secondary | ICD-10-CM

## 2019-12-05 DIAGNOSIS — M4807 Spinal stenosis, lumbosacral region: Secondary | ICD-10-CM

## 2019-12-05 NOTE — Telephone Encounter (Signed)
New orders had to be placed for pt since other orders expired. And Ms. Duane [name] is aware that someone can call to get pt rescehduled.

## 2019-12-10 ENCOUNTER — Ambulatory Visit: Payer: Medicare Other | Admitting: Orthopaedic Surgery

## 2020-02-25 ENCOUNTER — Telehealth: Payer: Self-pay | Admitting: Orthopaedic Surgery

## 2020-02-25 NOTE — Telephone Encounter (Signed)
Patient's son Dr Clovis Riley request if Dr Ninfa Linden could call him with the patients mri results @ [phone] instead of having the patient come back into the office.

## 2020-02-25 NOTE — Telephone Encounter (Signed)
Can ou tell where this was done?

## 2020-02-26 NOTE — Telephone Encounter (Signed)
Pt is actually scheduled on 03/10/20 at [facility].

## 2020-02-26 NOTE — Telephone Encounter (Signed)
He hasn't had the MRI yet, I guess he's just asking in general

## 2020-02-26 NOTE — Telephone Encounter (Signed)
I do not mind calling him with results.  I just need a reminder after the MRI has been done.

## 2020-02-26 NOTE — Telephone Encounter (Signed)
Son aware to give [place] a call after his MRI and Ninfa Linden will call to go over results with him

## 2020-03-10 ENCOUNTER — Ambulatory Visit
Admission: RE | Admit: 2020-03-10 | Discharge: 2020-03-10 | Disposition: A | Discharge status: Home / Self Care | Payer: Medicare Other | Source: Ambulatory Visit | Attending: Orthopaedic Surgery | Admitting: Orthopaedic Surgery

## 2020-03-10 ENCOUNTER — Other Ambulatory Visit: Payer: Self-pay

## 2020-03-10 DIAGNOSIS — S72001G Fracture of unspecified part of neck of right femur, subsequent encounter for closed fracture with delayed healing: Secondary | ICD-10-CM

## 2020-03-10 DIAGNOSIS — M4807 Spinal stenosis, lumbosacral region: Secondary | ICD-10-CM

## 2020-03-10 IMAGING — MR MR LUMBAR SPINE W/O CM
2 series · 48 of 48 positions shown · non-contrast
Comparison: None available

CLINICAL DATA: Low back pain.  Right hip pain.

EXAM:
MRI LUMBAR SPINE WITHOUT CONTRAST
TECHNIQUE: Multiplanar, multisequence MR imaging of the lumbar spine was
performed. No intravenous contrast was administered.

[Series 4: tirm sag · sagittal · 4.0mm · 0.73mm/px · 13 of 15 slices shown]
[im 1/15]
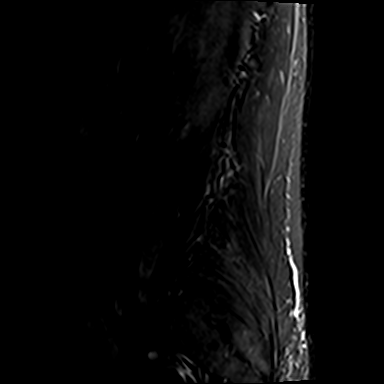
[im 2/15]
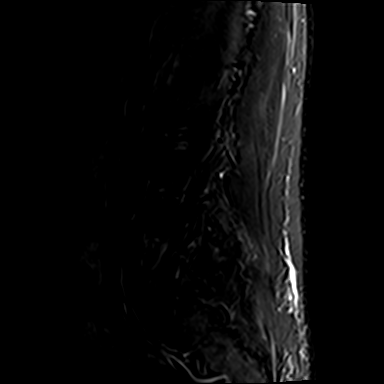
[im 3/15]
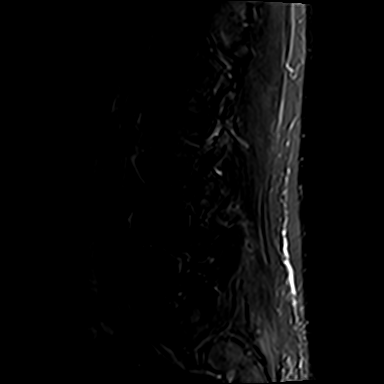
[im 4/15]
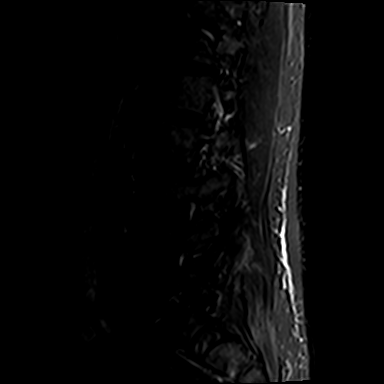
[im 5/15]
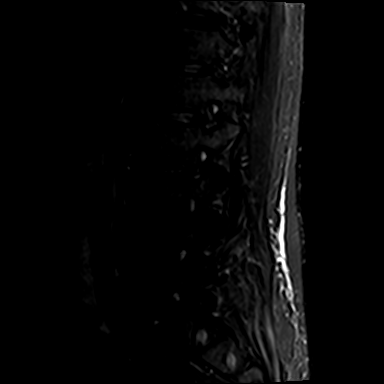
[im 6/15]
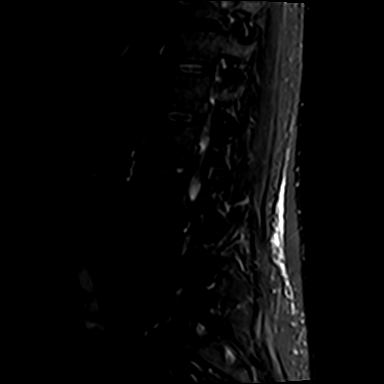
[im 8/15]
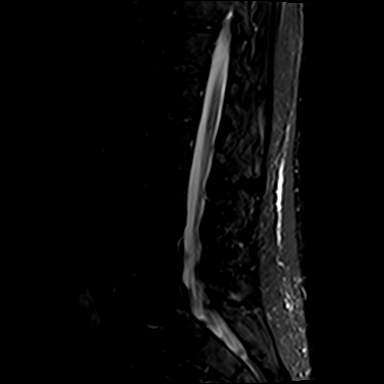
[im 9/15]
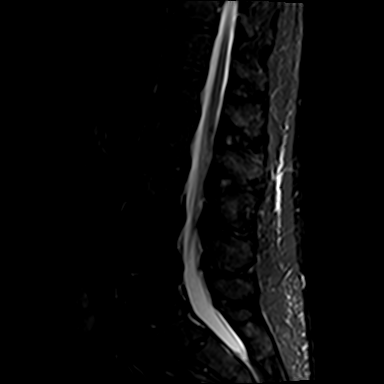
[im 10/15]
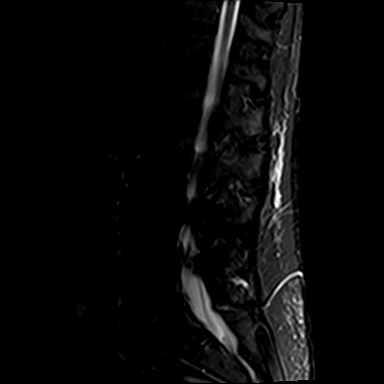
[im 11/15]
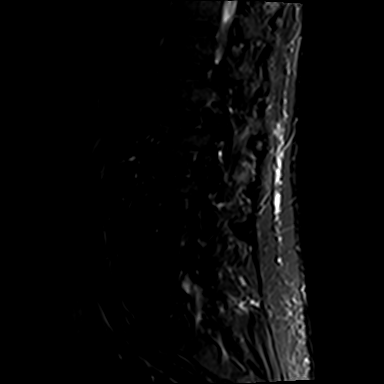
[im 12/15]
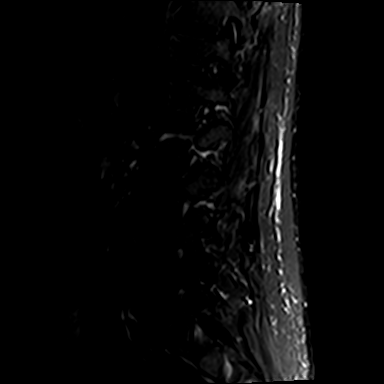
[im 13/15]
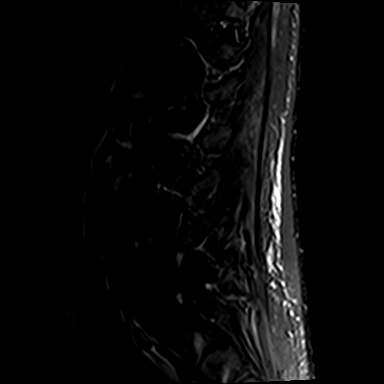
[im 15/15]
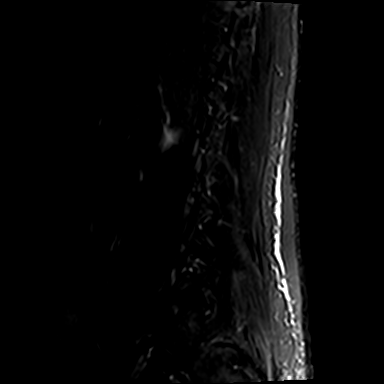

[Series 5: T1 · axial · 4.0mm · 0.78mm/px · z∈[-38,+171]mm · 35 of 39 slices shown]
[im 1/39]
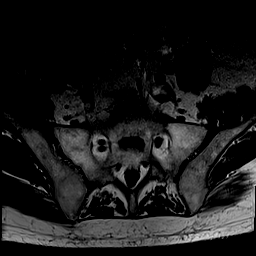
[im 2/39]
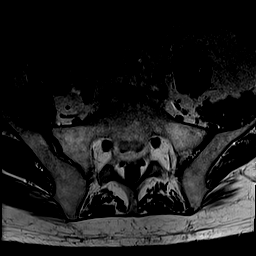
[im 3/39]
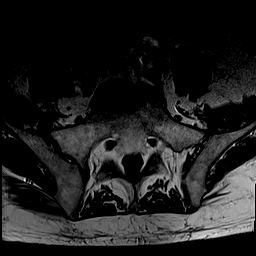
[im 4/39]
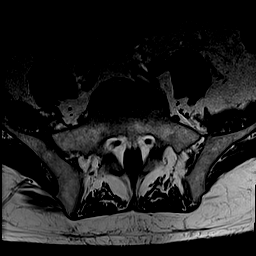
[im 5/39]
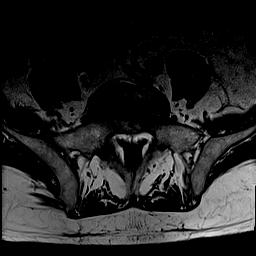
[im 6/39]
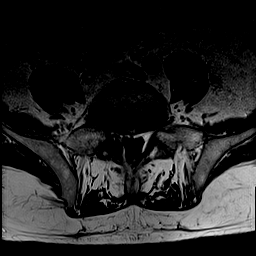
[im 7/39]
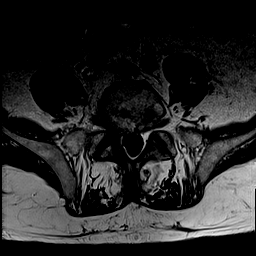
[im 8/39]
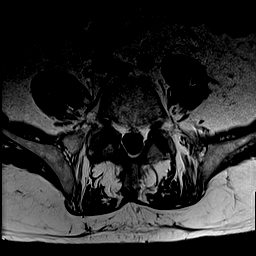
[im 9/39]
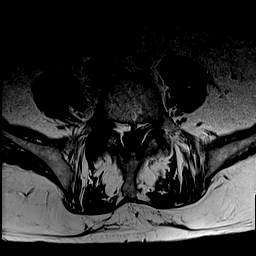
[im 11/39]
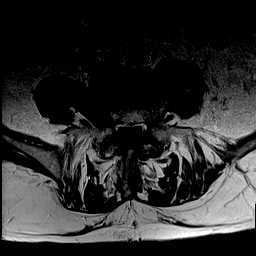
[im 12/39]
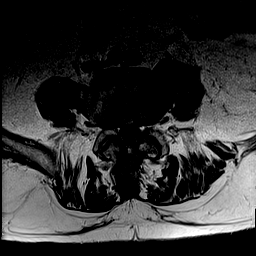
[im 13/39]
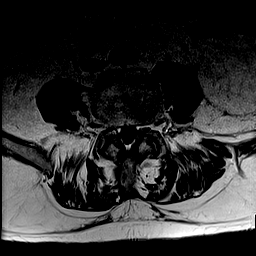
[im 14/39]
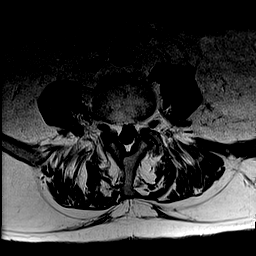
[im 15/39]
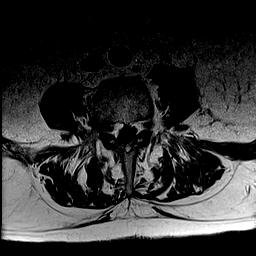
[im 16/39]
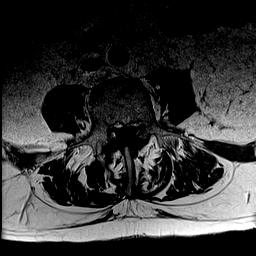
[im 17/39]
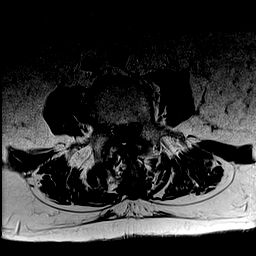
[im 18/39]
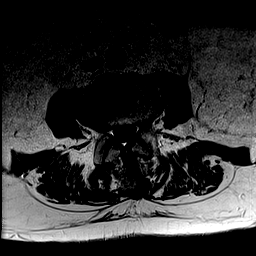
[im 20/39]
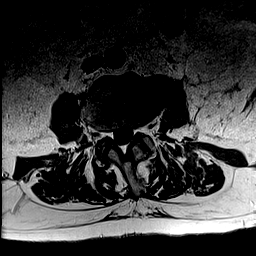
[im 21/39]
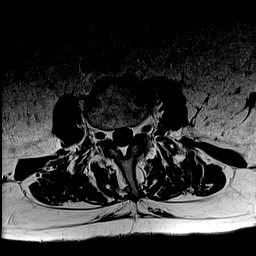
[im 22/39]
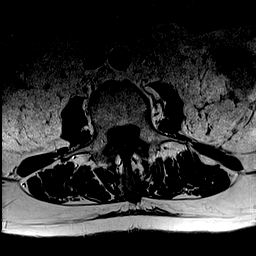
[im 23/39]
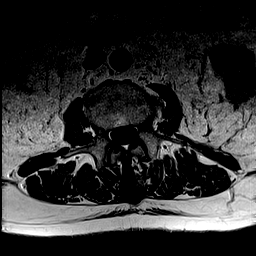
[im 24/39]
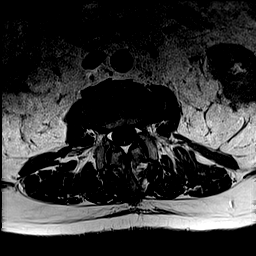
[im 25/39]
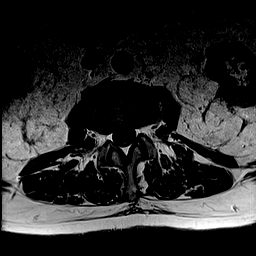
[im 26/39]
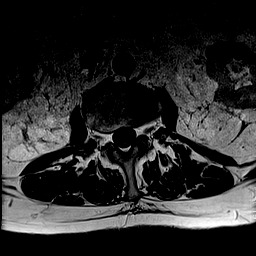
[im 27/39]
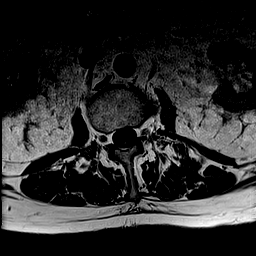
[im 28/39]
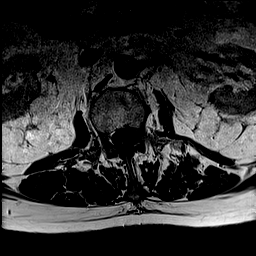
[im 30/39]
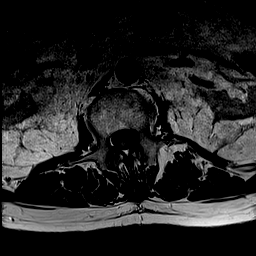
[im 31/39]
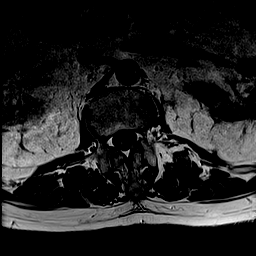
[im 32/39]
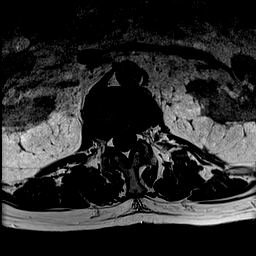
[im 33/39]
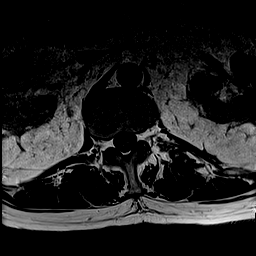
[im 34/39]
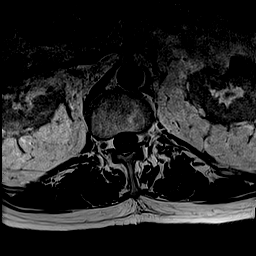
[im 35/39]
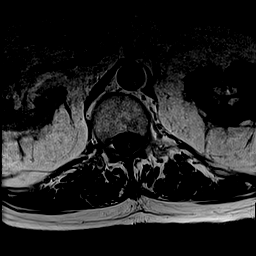
[im 36/39]
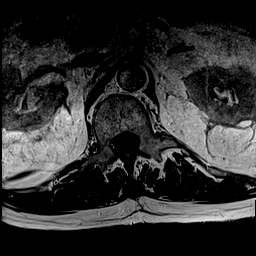
[im 37/39]
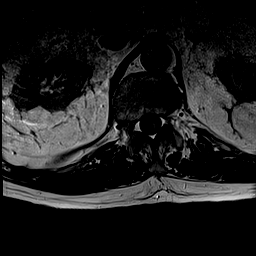
[im 39/39]
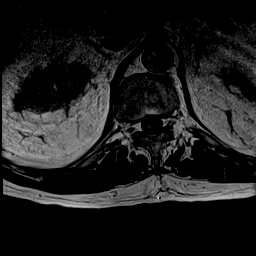

[48 of 48 positions shown; findings below may reference images not displayed]

FINDINGS: Segmentation:  Standard lumbar numbering

Alignment:  Mild dextroscoliosis.

Vertebrae:  No fracture, evidence of discitis, or bone lesion.

Conus medullaris and cauda equina: Conus extends to the mid L2
level. Conus and cauda equina appear normal.

Paraspinal and other soft tissues: Negative

Disc levels:

T12- L1 to L2-L3: Unremarkable for age.

L3-L4: Disc narrowing and mild bulging. Mild posterior element
hypertrophy. No neural compression

L4-L5: Moderate disc narrowing with endplate degeneration,
circumferential bulging, and facet spurring. Subarticular recess
narrowing but no compressive stenosis. Both foramina are patent

L5-S1:Disc narrowing and bulging with endplate degeneration and
facet spurring. Degenerative facet spurring on both sides. The
spinal canal is patent. Mild to moderate right foraminal narrowing.
IMPRESSION: 1. Disc and facet degeneration mainly at L3-4 and below with mild
scoliosis.
2. Diffusely patent spinal canal. Mild to moderate right foraminal
narrowing at L5-S1.

## 2020-03-10 IMAGING — MR MR HIP*R* W/O CM
5 series · 40 of 40 positions shown · non-contrast
Comparison: Right hip x-rays dated [DATE].

CLINICAL DATA: Chronic right hip pain.

EXAM:
MR OF THE RIGHT HIP WITHOUT CONTRAST
TECHNIQUE: Multiplanar, multisequence MR imaging was performed. No intravenous
contrast was administered.

[Series 3: T1 · coronal · 4.0mm · 1.48mm/px · 11 of 45 slices shown]
[im 1/45]
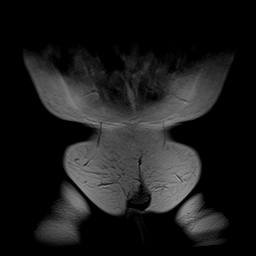
[im 5/45]
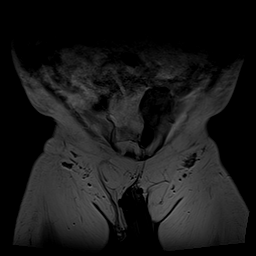
[im 9/45]
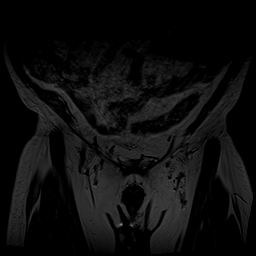
[im 14/45]
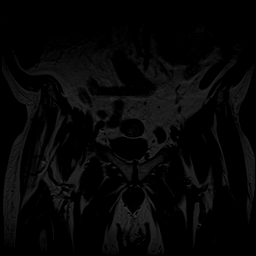
[im 18/45]
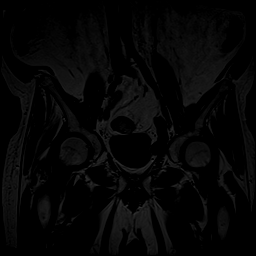
[im 23/45]
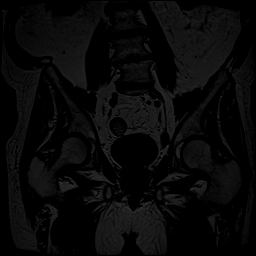
[im 27/45]
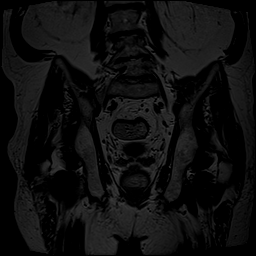
[im 31/45]
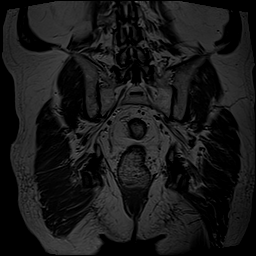
[im 36/45]
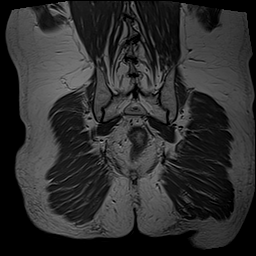
[im 40/45]
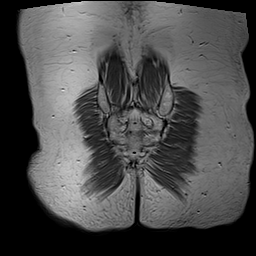
[im 45/45]
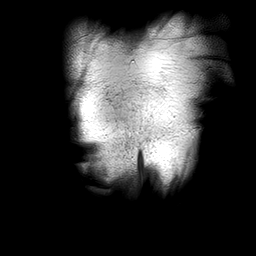

[Series 4: T2 fat-sat · coronal · 4.0mm · 1.48mm/px · 11 of 45 slices shown (1 of 2)]
[im 1/45]
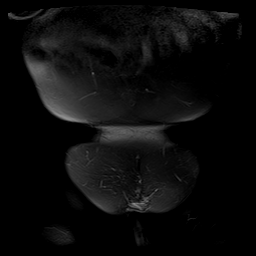
[im 5/45]
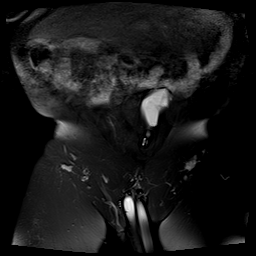
[im 9/45]
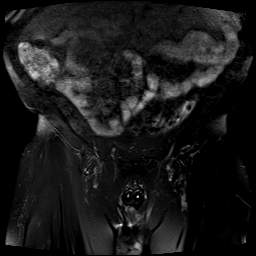
[im 14/45]
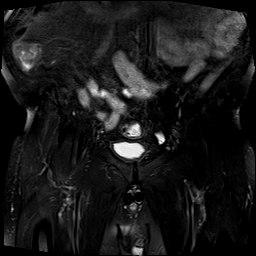
[im 18/45]
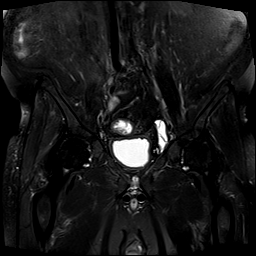
[im 23/45]
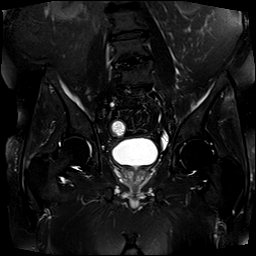
[im 27/45]
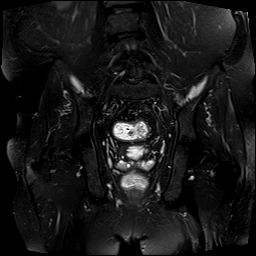
[im 31/45]
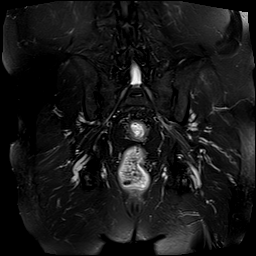
[im 36/45]
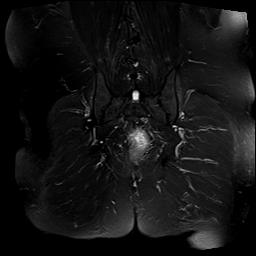
[im 40/45]
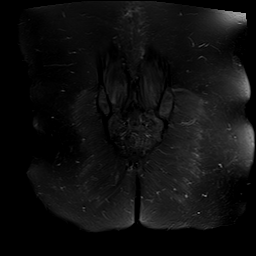
[im 45/45]
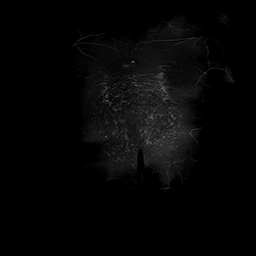

[Series 5: T2 fat-sat · axial · 4.0mm · 0.35mm/px · z∈[-103,+11]mm · 6 of 24 slices shown (2 of 2)]
[im 1/24]
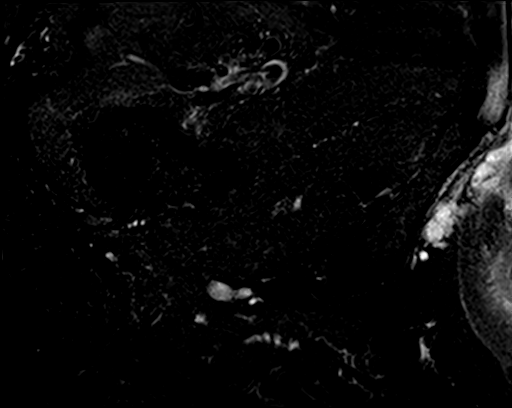
[im 5/24]
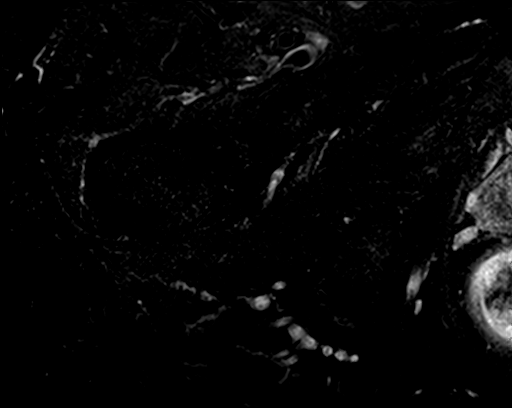
[im 10/24]
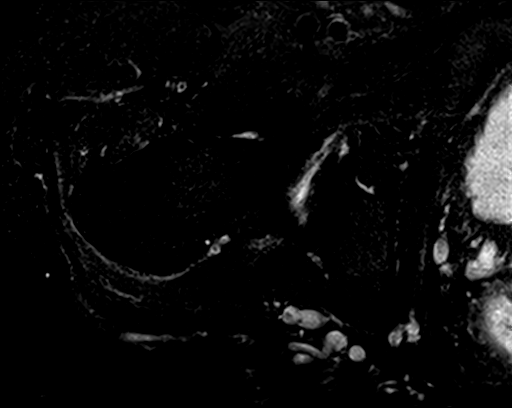
[im 14/24]
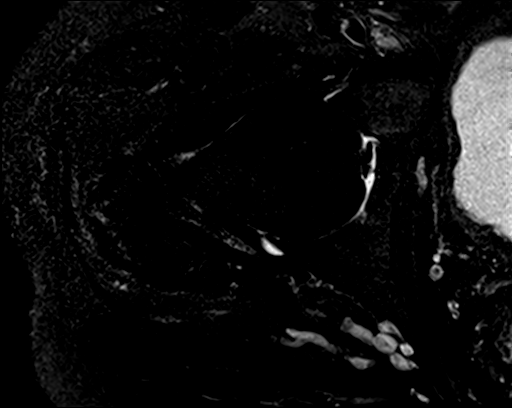
[im 19/24]
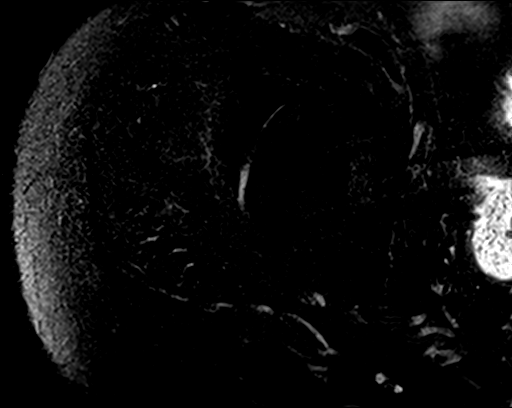
[im 24/24]
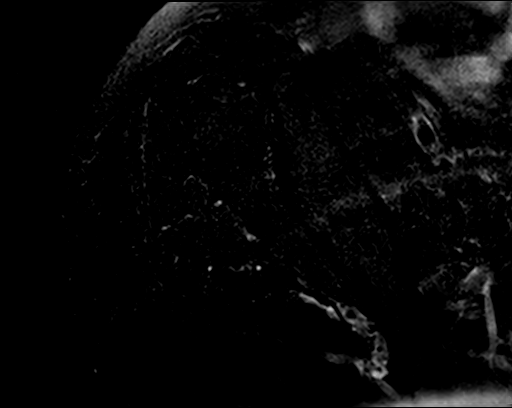

[Series 6: PD fat-sat · sagittal · 4.0mm · 0.94mm/px · 7 of 26 slices shown (1 of 2)]
[im 1/26]
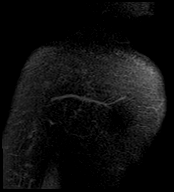
[im 5/26]
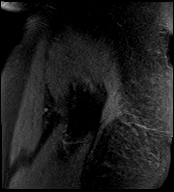
[im 9/26]
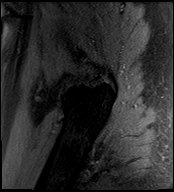
[im 13/26]
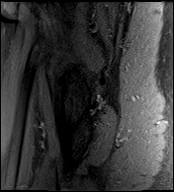
[im 17/26]
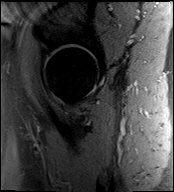
[im 21/26]
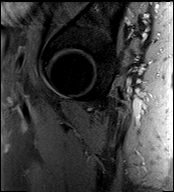
[im 26/26]
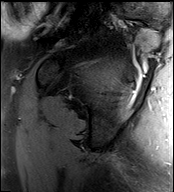

[Series 7: PD fat-sat · coronal · 4.0mm · 0.94mm/px · 5 of 19 slices shown (2 of 2)]
[im 1/19]
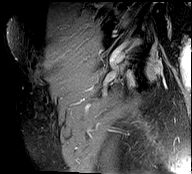
[im 5/19]
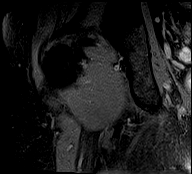
[im 10/19]
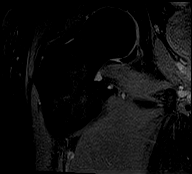
[im 14/19]
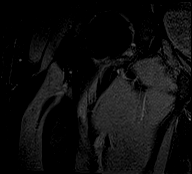
[im 19/19]
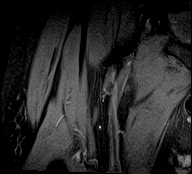

[40 of 40 positions shown; findings below may reference images not displayed]

FINDINGS: Bones: There is no evidence of acute fracture, dislocation or
avascular necrosis. No suspicious bone lesion. Left sacral ala
hemangioma. The visualized sacroiliac joints and symphysis pubis
appear normal.

Articular cartilage and labrum

Articular cartilage: No focal chondral defect or subchondral signal
abnormality identified.

Labrum: Right anterior superior labral tear (series 5, images 8-9).
Left posterior superior labral tear with tiny paralabral cyst
(series 4, images 23-24).

Joint or bursal effusion

Joint effusion: No significant hip joint effusion.

Bursae: No focal periarticular fluid collection.

Muscles and tendons

Muscles and tendons: The visualized gluteus, hamstring and iliopsoas
tendons are intact. Mild bilateral hamstring tendinosis. No muscle
edema or atrophy. Mild symmetric edema deep to both iliacus muscles.

Other findings

Miscellaneous: Penile prosthesis noted. The visualized internal
pelvic contents otherwise appear unremarkable.
IMPRESSION: 1. No acute abnormality.
2. Right anterior superior and left posterior superior labral tears.
3. Mild bilateral hamstring tendinosis.

## 2021-05-11 ENCOUNTER — Emergency Department (HOSPITAL_COMMUNITY): Payer: Medicare Other

## 2021-05-11 ENCOUNTER — Encounter (HOSPITAL_COMMUNITY): Payer: Self-pay | Admitting: Emergency Medicine

## 2021-05-11 ENCOUNTER — Inpatient Hospital Stay (HOSPITAL_COMMUNITY)
Admission: EM | Admit: 2021-05-11 | Discharge: 2021-05-15 | DRG: 065 | Disposition: A | Discharge status: Skilled Nursing Facility | Payer: Medicare Other | Attending: Internal Medicine | Admitting: Internal Medicine

## 2021-05-11 ENCOUNTER — Other Ambulatory Visit: Payer: Self-pay

## 2021-05-11 DIAGNOSIS — Z7901 Long term (current) use of anticoagulants: Secondary | ICD-10-CM

## 2021-05-11 DIAGNOSIS — Z20822 Contact with and (suspected) exposure to covid-19: Secondary | ICD-10-CM | POA: Diagnosis present

## 2021-05-11 DIAGNOSIS — E860 Dehydration: Secondary | ICD-10-CM | POA: Diagnosis present

## 2021-05-11 DIAGNOSIS — I10 Essential (primary) hypertension: Secondary | ICD-10-CM

## 2021-05-11 DIAGNOSIS — R471 Dysarthria and anarthria: Secondary | ICD-10-CM | POA: Diagnosis present

## 2021-05-11 DIAGNOSIS — E781 Pure hyperglyceridemia: Secondary | ICD-10-CM | POA: Diagnosis present

## 2021-05-11 DIAGNOSIS — N1831 Chronic kidney disease, stage 3a: Secondary | ICD-10-CM

## 2021-05-11 DIAGNOSIS — I63312 Cerebral infarction due to thrombosis of left middle cerebral artery: Secondary | ICD-10-CM | POA: Diagnosis not present

## 2021-05-11 DIAGNOSIS — I2510 Atherosclerotic heart disease of native coronary artery without angina pectoris: Secondary | ICD-10-CM | POA: Diagnosis present

## 2021-05-11 DIAGNOSIS — I442 Atrioventricular block, complete: Secondary | ICD-10-CM | POA: Diagnosis present

## 2021-05-11 DIAGNOSIS — Z8249 Family history of ischemic heart disease and other diseases of the circulatory system: Secondary | ICD-10-CM

## 2021-05-11 DIAGNOSIS — I639 Cerebral infarction, unspecified: Secondary | ICD-10-CM | POA: Diagnosis present

## 2021-05-11 DIAGNOSIS — Z8673 Personal history of transient ischemic attack (TIA), and cerebral infarction without residual deficits: Secondary | ICD-10-CM | POA: Diagnosis present

## 2021-05-11 DIAGNOSIS — Z955 Presence of coronary angioplasty implant and graft: Secondary | ICD-10-CM | POA: Diagnosis not present

## 2021-05-11 DIAGNOSIS — E1142 Type 2 diabetes mellitus with diabetic polyneuropathy: Secondary | ICD-10-CM | POA: Diagnosis present

## 2021-05-11 DIAGNOSIS — T1490XA Injury, unspecified, initial encounter: Secondary | ICD-10-CM

## 2021-05-11 DIAGNOSIS — Z91030 Bee allergy status: Secondary | ICD-10-CM

## 2021-05-11 DIAGNOSIS — E785 Hyperlipidemia, unspecified: Secondary | ICD-10-CM | POA: Diagnosis present

## 2021-05-11 DIAGNOSIS — Z6829 Body mass index (BMI) 29.0-29.9, adult: Secondary | ICD-10-CM | POA: Diagnosis not present

## 2021-05-11 DIAGNOSIS — I129 Hypertensive chronic kidney disease with stage 1 through stage 4 chronic kidney disease, or unspecified chronic kidney disease: Secondary | ICD-10-CM | POA: Diagnosis present

## 2021-05-11 DIAGNOSIS — R29706 NIHSS score 6: Secondary | ICD-10-CM | POA: Diagnosis present

## 2021-05-11 DIAGNOSIS — I480 Paroxysmal atrial fibrillation: Secondary | ICD-10-CM | POA: Diagnosis present

## 2021-05-11 DIAGNOSIS — Z8616 Personal history of COVID-19: Secondary | ICD-10-CM

## 2021-05-11 DIAGNOSIS — F0390 Unspecified dementia without behavioral disturbance: Secondary | ICD-10-CM | POA: Diagnosis present

## 2021-05-11 DIAGNOSIS — R4701 Aphasia: Secondary | ICD-10-CM | POA: Diagnosis present

## 2021-05-11 DIAGNOSIS — I6381 Other cerebral infarction due to occlusion or stenosis of small artery: Secondary | ICD-10-CM | POA: Diagnosis present

## 2021-05-11 DIAGNOSIS — D631 Anemia in chronic kidney disease: Secondary | ICD-10-CM | POA: Diagnosis present

## 2021-05-11 DIAGNOSIS — I252 Old myocardial infarction: Secondary | ICD-10-CM

## 2021-05-11 DIAGNOSIS — E663 Overweight: Secondary | ICD-10-CM | POA: Diagnosis present

## 2021-05-11 DIAGNOSIS — E1159 Type 2 diabetes mellitus with other circulatory complications: Secondary | ICD-10-CM

## 2021-05-11 DIAGNOSIS — N179 Acute kidney failure, unspecified: Secondary | ICD-10-CM | POA: Diagnosis present

## 2021-05-11 DIAGNOSIS — E1122 Type 2 diabetes mellitus with diabetic chronic kidney disease: Secondary | ICD-10-CM | POA: Diagnosis present

## 2021-05-11 DIAGNOSIS — E876 Hypokalemia: Secondary | ICD-10-CM | POA: Diagnosis present

## 2021-05-11 DIAGNOSIS — Z79899 Other long term (current) drug therapy: Secondary | ICD-10-CM

## 2021-05-11 DIAGNOSIS — Z91041 Radiographic dye allergy status: Secondary | ICD-10-CM

## 2021-05-11 DIAGNOSIS — G4733 Obstructive sleep apnea (adult) (pediatric): Secondary | ICD-10-CM | POA: Diagnosis present

## 2021-05-11 DIAGNOSIS — W19XXXA Unspecified fall, initial encounter: Secondary | ICD-10-CM | POA: Diagnosis present

## 2021-05-11 DIAGNOSIS — N1832 Chronic kidney disease, stage 3b: Secondary | ICD-10-CM | POA: Diagnosis present

## 2021-05-11 DIAGNOSIS — E119 Type 2 diabetes mellitus without complications: Secondary | ICD-10-CM

## 2021-05-11 DIAGNOSIS — R29810 Facial weakness: Secondary | ICD-10-CM | POA: Diagnosis present

## 2021-05-11 DIAGNOSIS — Z888 Allergy status to other drugs, medicaments and biological substances status: Secondary | ICD-10-CM

## 2021-05-11 DIAGNOSIS — N183 Chronic kidney disease, stage 3 unspecified: Secondary | ICD-10-CM | POA: Diagnosis present

## 2021-05-11 DIAGNOSIS — I63319 Cerebral infarction due to thrombosis of unspecified middle cerebral artery: Secondary | ICD-10-CM | POA: Diagnosis not present

## 2021-05-11 DIAGNOSIS — I6389 Other cerebral infarction: Secondary | ICD-10-CM | POA: Diagnosis not present

## 2021-05-11 DIAGNOSIS — Z7984 Long term (current) use of oral hypoglycemic drugs: Secondary | ICD-10-CM

## 2021-05-11 DIAGNOSIS — Z7902 Long term (current) use of antithrombotics/antiplatelets: Secondary | ICD-10-CM

## 2021-05-11 LAB — COMPREHENSIVE METABOLIC PANEL
ALT: 16 U/L (ref 0–44)
ALT: 15 U/L (ref 0–44)
AST: 25 U/L (ref 15–41)
AST: 30 U/L (ref 15–41)
Albumin: 3.3 g/dL — ABNORMAL LOW (ref 3.5–5.0)
Albumin: 3.5 g/dL (ref 3.5–5.0)
Alkaline Phosphatase: 49 U/L (ref 38–126)
Alkaline Phosphatase: 48 U/L (ref 38–126)
Anion gap: 12 (ref 5–15)
Anion gap: 12 (ref 5–15)
BUN: 15 mg/dL (ref 8–23)
BUN: 15 mg/dL (ref 8–23)
CO2: 20 mmol/L — ABNORMAL LOW (ref 22–32)
CO2: 22 mmol/L (ref 22–32)
Calcium: 8.9 mg/dL (ref 8.9–10.3)
Calcium: 9.3 mg/dL (ref 8.9–10.3)
Chloride: 103 mmol/L (ref 98–111)
Chloride: 103 mmol/L (ref 98–111)
Creatinine, Ser: 1.56 mg/dL — ABNORMAL HIGH (ref 0.61–1.24)
Creatinine, Ser: 1.64 mg/dL — ABNORMAL HIGH (ref 0.61–1.24)
GFR, Estimated: 44 mL/min — ABNORMAL LOW (ref >60)
GFR, Estimated: 41 mL/min — ABNORMAL LOW (ref >60)
Glucose, Bld: 234 mg/dL — ABNORMAL HIGH (ref 70–99)
Glucose, Bld: 226 mg/dL — ABNORMAL HIGH (ref 70–99)
Potassium: 4.2 mmol/L (ref 3.5–5.1)
Potassium: 4.5 mmol/L (ref 3.5–5.1)
Sodium: 135 mmol/L (ref 135–145)
Sodium: 137 mmol/L (ref 135–145)
Total Bilirubin: 0.6 mg/dL (ref 0.3–1.2)
Total Bilirubin: 0.7 mg/dL (ref 0.3–1.2)
Total Protein: 6.5 g/dL (ref 6.5–8.1)
Total Protein: 6.8 g/dL (ref 6.5–8.1)

## 2021-05-11 LAB — CBC
HCT: 36 % — ABNORMAL LOW (ref 39.0–52.0)
HCT: 37.2 % — ABNORMAL LOW (ref 39.0–52.0)
Hemoglobin: 11.6 g/dL — ABNORMAL LOW (ref 13.0–17.0)
Hemoglobin: 12.5 g/dL — ABNORMAL LOW (ref 13.0–17.0)
MCH: 29.6 pg (ref 26.0–34.0)
MCH: 30.1 pg (ref 26.0–34.0)
MCHC: 32.2 g/dL (ref 30.0–36.0)
MCHC: 33.6 g/dL (ref 30.0–36.0)
MCV: 89.6 fL (ref 80.0–100.0)
MCV: 91.8 fL (ref 80.0–100.0)
Platelets: 192 10*3/uL (ref 150–400)
Platelets: 206 10*3/uL (ref 150–400)
RBC: 3.92 MIL/uL — ABNORMAL LOW (ref 4.22–5.81)
RBC: 4.15 MIL/uL — ABNORMAL LOW (ref 4.22–5.81)
RDW: 14.4 % (ref 11.5–15.5)
RDW: 14.6 % (ref 11.5–15.5)
WBC: 8.4 10*3/uL (ref 4.0–10.5)
WBC: 9.1 10*3/uL (ref 4.0–10.5)
nRBC: 0 % (ref 0.0–0.2)
nRBC: 0 % (ref 0.0–0.2)

## 2021-05-11 LAB — RAPID URINE DRUG SCREEN, HOSP PERFORMED
Amphetamines: NOT DETECTED
Barbiturates: NOT DETECTED
Benzodiazepines: NOT DETECTED
Cocaine: NOT DETECTED
Opiates: NOT DETECTED
Tetrahydrocannabinol: NOT DETECTED

## 2021-05-11 LAB — PROTIME-INR
INR: 1.1 (ref 0.8–1.2)
INR: 1.2 (ref 0.8–1.2)
Prothrombin Time: 14.5 seconds (ref 11.4–15.2)
Prothrombin Time: 15.1 seconds (ref 11.4–15.2)

## 2021-05-11 LAB — URINALYSIS, ROUTINE W REFLEX MICROSCOPIC
Bacteria, UA: NONE SEEN
Bilirubin Urine: NEGATIVE
Glucose, UA: >=500 mg/dL — AB
Hgb urine dipstick: NEGATIVE
Ketones, ur: NEGATIVE mg/dL
Leukocytes,Ua: NEGATIVE
Nitrite: NEGATIVE
Protein, ur: NEGATIVE mg/dL
Specific Gravity, Urine: 1.011 (ref 1.005–1.030)
pH: 6 (ref 5.0–8.0)

## 2021-05-11 LAB — APTT: aPTT: 38 seconds — ABNORMAL HIGH (ref 24–36)

## 2021-05-11 LAB — I-STAT CHEM 8, ED
BUN: 15 mg/dL (ref 8–23)
BUN: 14 mg/dL (ref 8–23)
Calcium, Ion: 1.11 mmol/L — ABNORMAL LOW (ref 1.15–1.40)
Calcium, Ion: 1.21 mmol/L (ref 1.15–1.40)
Chloride: 105 mmol/L (ref 98–111)
Chloride: 104 mmol/L (ref 98–111)
Creatinine, Ser: 1.6 mg/dL — ABNORMAL HIGH (ref 0.61–1.24)
Creatinine, Ser: 1.6 mg/dL — ABNORMAL HIGH (ref 0.61–1.24)
Glucose, Bld: 242 mg/dL — ABNORMAL HIGH (ref 70–99)
Glucose, Bld: 214 mg/dL — ABNORMAL HIGH (ref 70–99)
HCT: 34 % — ABNORMAL LOW (ref 39.0–52.0)
HCT: 35 % — ABNORMAL LOW (ref 39.0–52.0)
Hemoglobin: 11.6 g/dL — ABNORMAL LOW (ref 13.0–17.0)
Hemoglobin: 11.9 g/dL — ABNORMAL LOW (ref 13.0–17.0)
Potassium: 4.1 mmol/L (ref 3.5–5.1)
Potassium: 4.1 mmol/L (ref 3.5–5.1)
Sodium: 136 mmol/L (ref 135–145)
Sodium: 138 mmol/L (ref 135–145)
TCO2: 20 mmol/L — ABNORMAL LOW (ref 22–32)
TCO2: 22 mmol/L (ref 22–32)

## 2021-05-11 LAB — DIFFERENTIAL
Abs Immature Granulocytes: 0.04 10*3/uL (ref 0.00–0.07)
Basophils Absolute: 0.1 10*3/uL (ref 0.0–0.1)
Basophils Relative: 1 %
Eosinophils Absolute: 0.3 10*3/uL (ref 0.0–0.5)
Eosinophils Relative: 3 %
Immature Granulocytes: 0 %
Lymphocytes Relative: 33 %
Lymphs Abs: 3 10*3/uL (ref 0.7–4.0)
Monocytes Absolute: 0.8 10*3/uL (ref 0.1–1.0)
Monocytes Relative: 8 %
Neutro Abs: 5 10*3/uL (ref 1.7–7.7)
Neutrophils Relative %: 55 %

## 2021-05-11 LAB — RESP PANEL BY RT-PCR (FLU A&B, COVID) ARPGX2
Influenza A by PCR: NEGATIVE
Influenza B by PCR: NEGATIVE
SARS Coronavirus 2 by RT PCR: NEGATIVE

## 2021-05-11 LAB — LACTIC ACID, PLASMA
Lactic Acid, Venous: 2.8 mmol/L (ref 0.5–1.9)
Lactic Acid, Venous: 2.4 mmol/L (ref 0.5–1.9)
Lactic Acid, Venous: 2.2 mmol/L (ref 0.5–1.9)

## 2021-05-11 LAB — ETHANOL
Alcohol, Ethyl (B): <10 mg/dL (ref <10)
Alcohol, Ethyl (B): <10 mg/dL (ref <10)

## 2021-05-11 LAB — SAMPLE TO BLOOD BANK

## 2021-05-11 LAB — CREATININE, URINE, RANDOM: Creatinine, Urine: 82.37 mg/dL

## 2021-05-11 LAB — SODIUM, URINE, RANDOM: Sodium, Ur: 66 mmol/L

## 2021-05-11 IMAGING — CT CT CERVICAL SPINE W/O CM
3 of 4 series · 9 of 33 positions shown, 11 images · non-contrast
Comparison: [DATE]

CLINICAL DATA: Fall.  Head trauma

EXAM:
CT HEAD WITHOUT CONTRAST
CT CERVICAL SPINE WITHOUT CONTRAST
TECHNIQUE: Multidetector CT imaging of the head and cervical spine was
performed following the standard protocol without intravenous
contrast. Multiplanar CT image reconstructions of the cervical spine
were also generated.

[Series 7: sag bone · sagittal · 0.30mm/px · 5 of 83 slices shown, 6 images]
[im 28/83  bone]
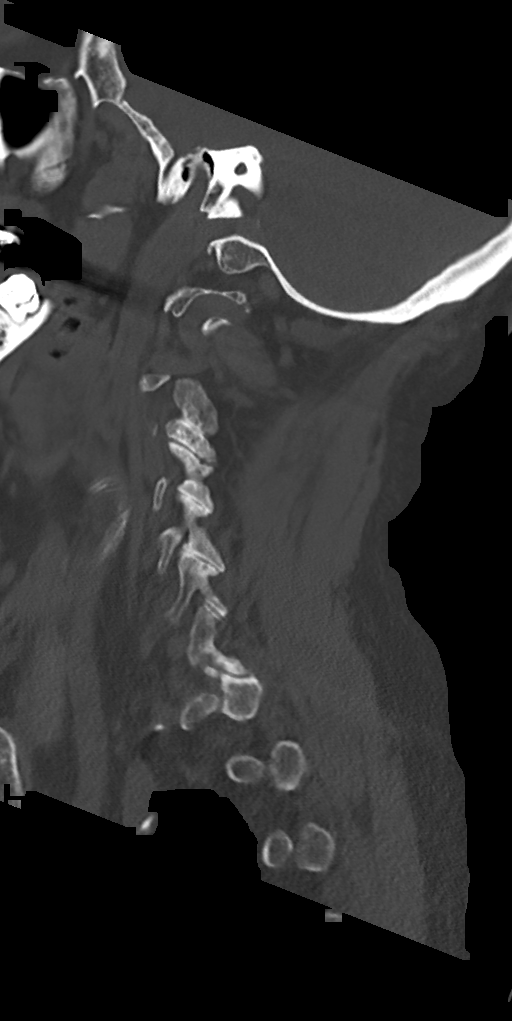
[im 35/83  bone]
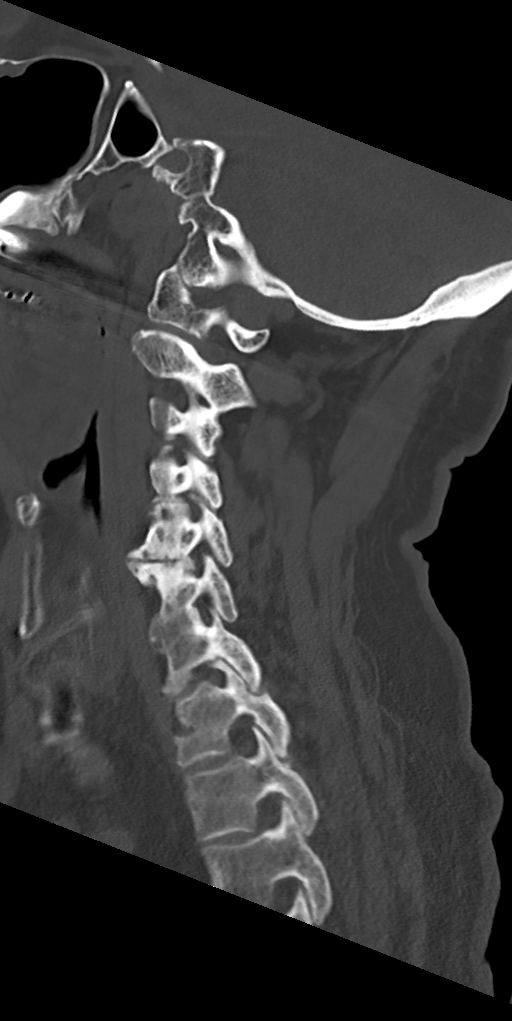
[im 42/83  soft-tissue]
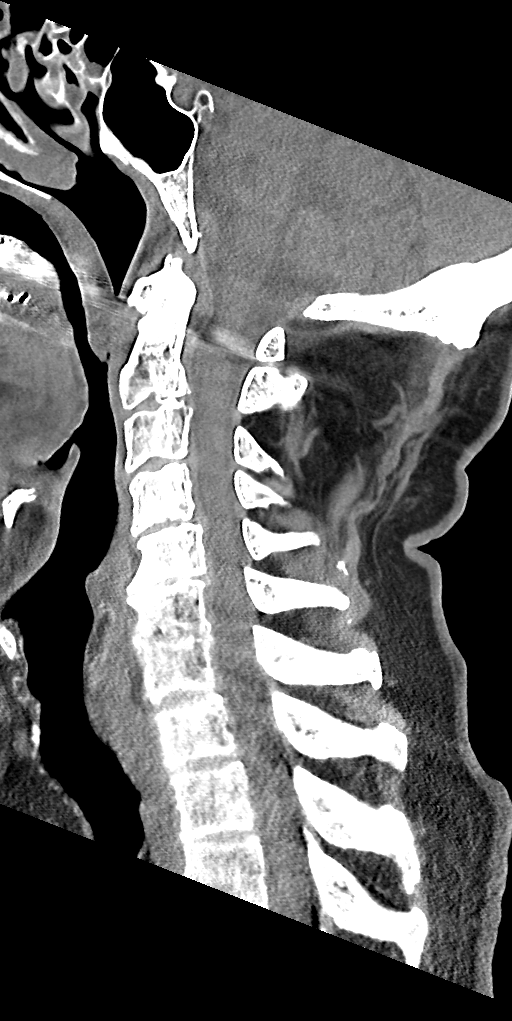
[im 42/83  bone]
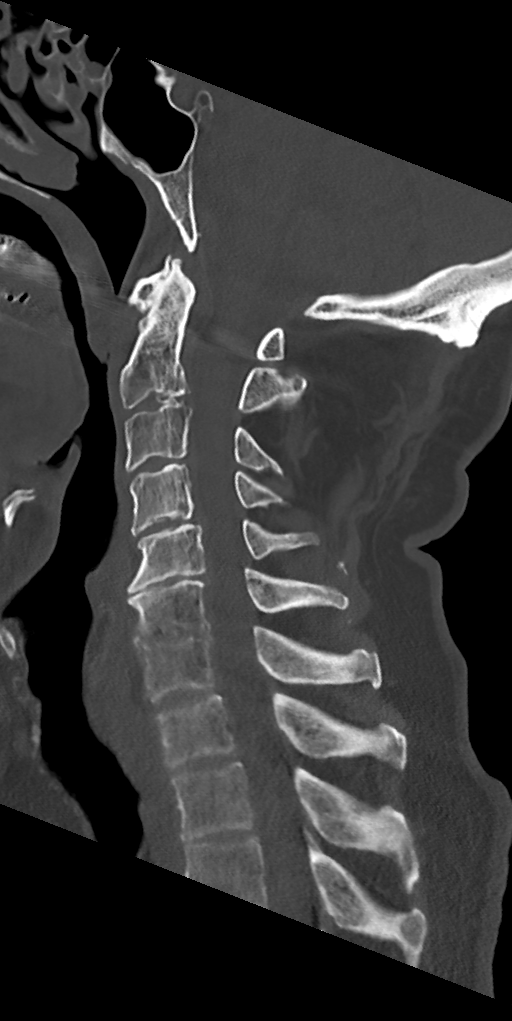
[im 48/83  bone]
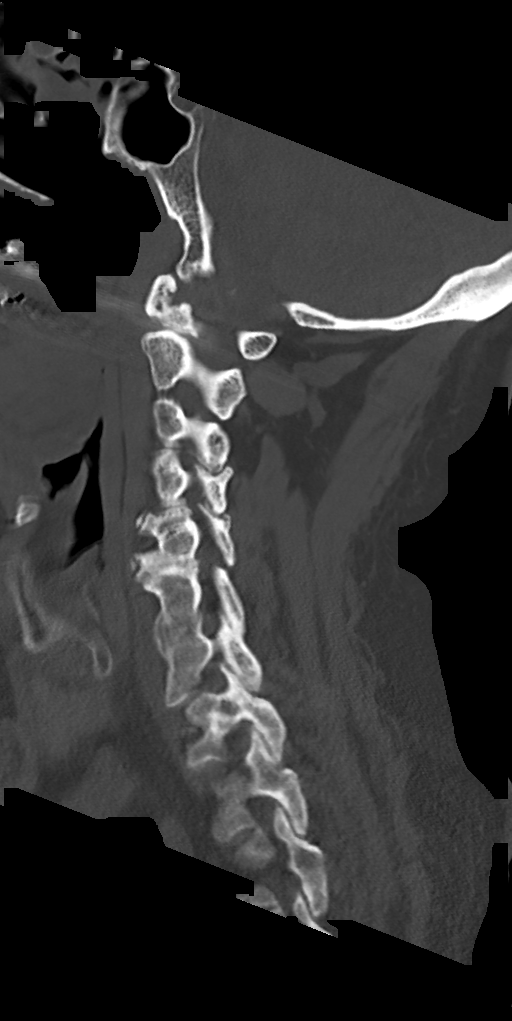
[im 55/83  bone]
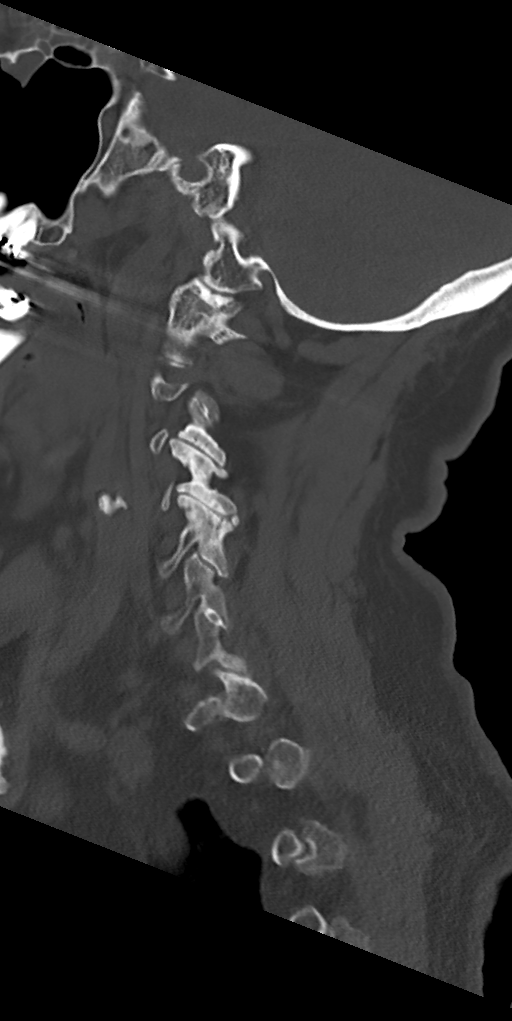

[Series 8: cor bone · coronal · 0.33mm/px · 3 of 70 slices shown]
[im 16/70  bone]
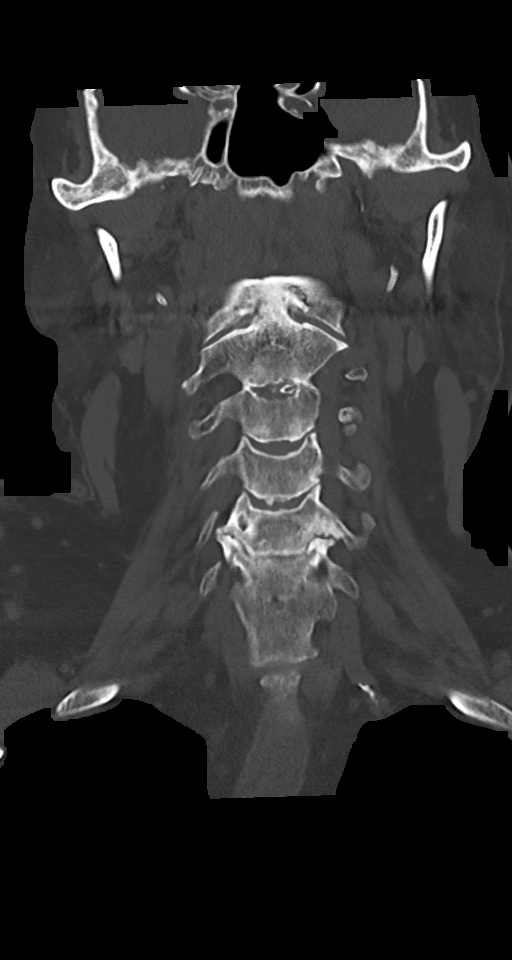
[im 29/70  bone]
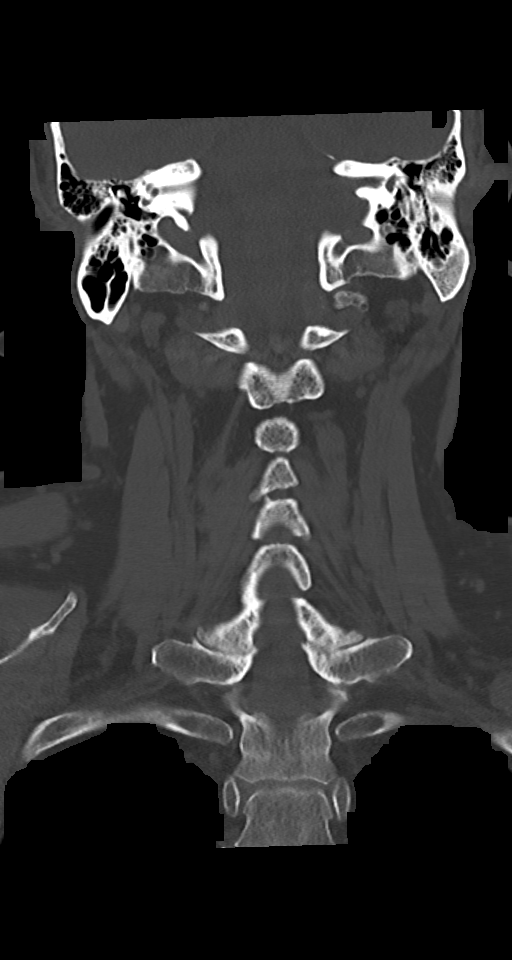
[im 41/70  bone]
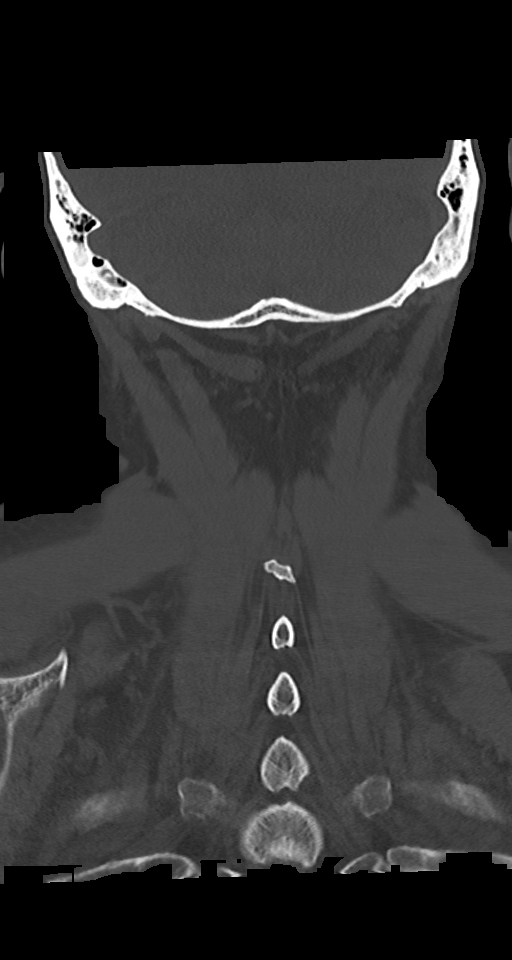

[Series 10: orthogonal axials · axial · 0.21mm/px · z∈[+987,+987]mm · 1 of 103 slices shown, 2 images]
[im 52/103  soft-tissue]
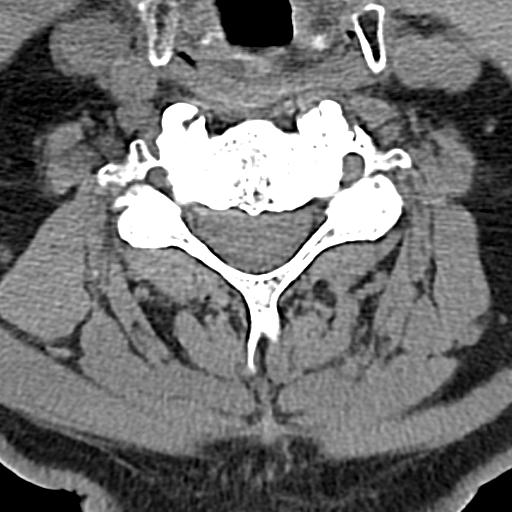
[im 52/103  bone]
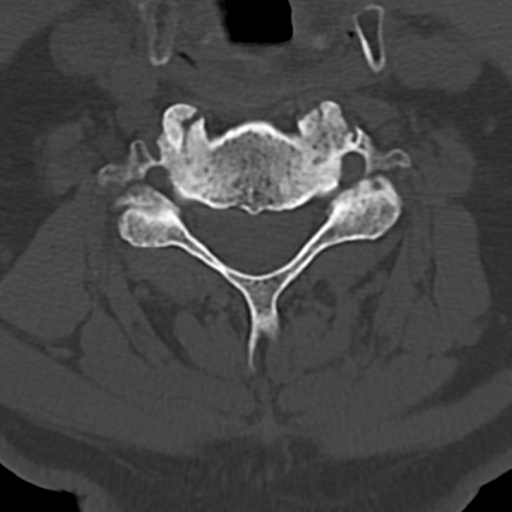

[9 of 33 positions shown; findings below may reference images not displayed]

FINDINGS: CT HEAD FINDINGS

Brain: Generalized atrophy. Moderate white matter hypodensity
bilaterally, unchanged. Mild ventricular enlargement due to atrophy,
unchanged.

Negative for acute infarct, hemorrhage, mass

Vascular: Negative for hyperdense vessel

Skull: Negative

Sinuses/Orbits: Mild mucosal edema paranasal sinuses. Bilateral
cataract extraction.

Other: None

CT CERVICAL SPINE FINDINGS

Alignment: Mild anterolisthesis C4-5

Skull base and vertebrae: Negative for fracture

Soft tissues and spinal canal: Negative

Disc levels: Multilevel disc and facet degeneration. Foraminal
stenosis bilaterally most prominent C4-5 and C5-6 due to spurring.

Upper chest: Lung apices clear bilaterally.

Other: None
IMPRESSION: 1. No acute intracranial abnormality. Atrophy with chronic
microvascular ischemic change in the white matter
2. Negative for cervical spine fracture.  Multilevel spondylosis.

## 2021-05-11 IMAGING — MR MR HEAD W/O CM
12 of 13 series · 44 of 48 positions shown · non-contrast
Comparison: Same day CT head.  MRI [DATE].

CLINICAL DATA: Neuro deficit, acute stroke suspected.

EXAM:
MRI HEAD WITHOUT CONTRAST
TECHNIQUE: Multiplanar, multiecho pulse sequences of the brain and surrounding
structures were obtained without intravenous contrast.

[Series 5: DWI · axial · 3.0mm · 0.92mm/px · z∈[-105,+47]mm · 7 of 104 slices shown (1 of 4)]
[im 1/104]
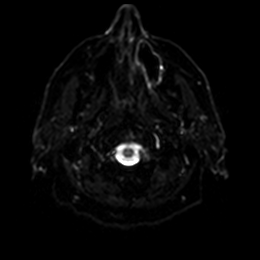
[im 18/104]
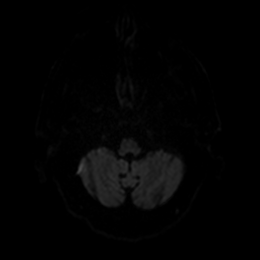
[im 35/104]
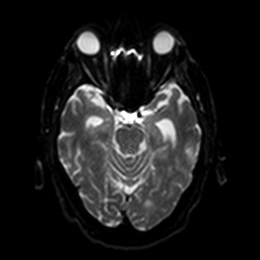
[im 52/104]
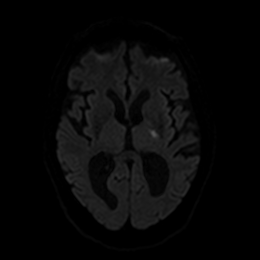
[im 69/104]
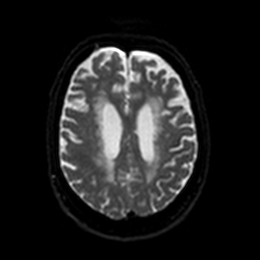
[im 86/104]
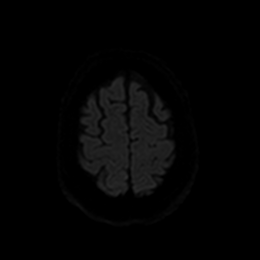
[im 104/104]
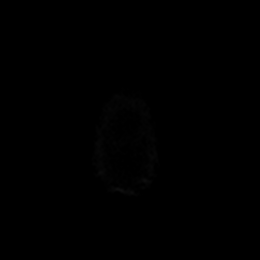

[Series 6: DWI · axial · 3.0mm · 0.92mm/px · z∈[-105,+47]mm · 4 of 52 slices shown (2 of 4)]
[im 1/52]
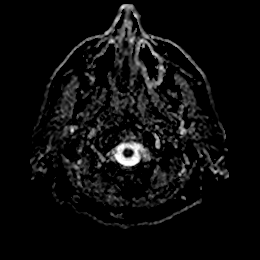
[im 18/52]
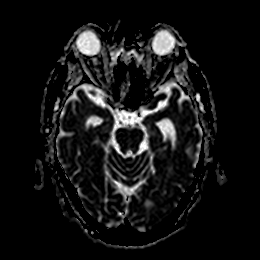
[im 35/52]
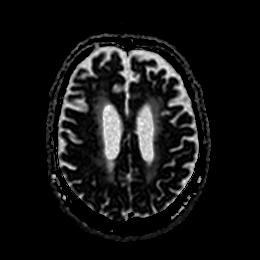
[im 52/52]
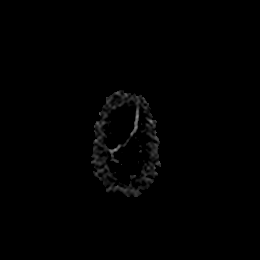

[Series 7: DWI · coronal · 4.0mm · 0.88mm/px · 6 of 76 slices shown (3 of 4)]
[im 1/76]
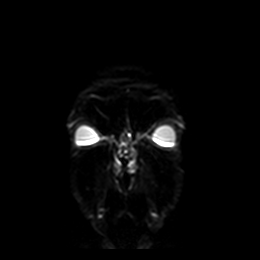
[im 16/76]
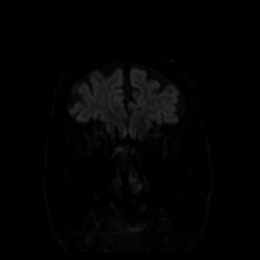
[im 31/76]
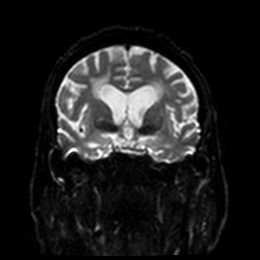
[im 46/76]
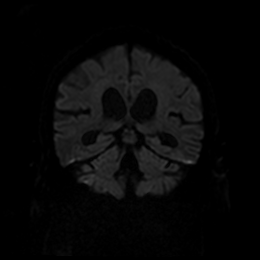
[im 61/76]
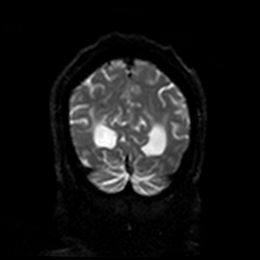
[im 76/76]
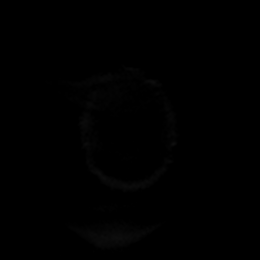

[Series 8: DWI · coronal · 4.0mm · 0.88mm/px · 3 of 38 slices shown (4 of 4)]
[im 1/38]
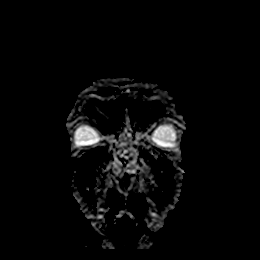
[im 19/38]
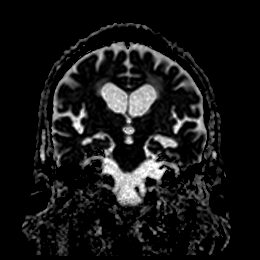
[im 38/38]
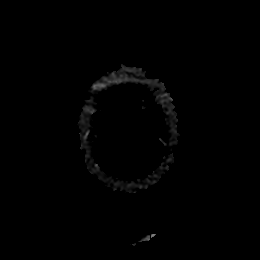

[Series 9: T1 · sagittal · 5.0mm · 0.81mm/px · 2 of 23 slices shown]
[im 1/23]
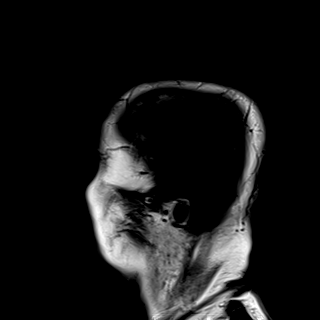
[im 23/23]
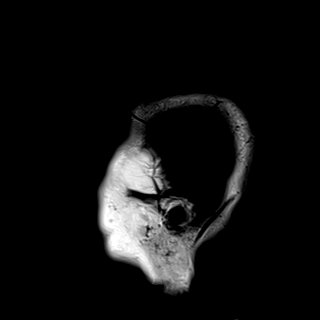

[Series 10: T2 · axial · 5.0mm · 0.75mm/px · z∈[-113,+31]mm · 2 of 25 slices shown (1 of 2)]
[im 1/25]
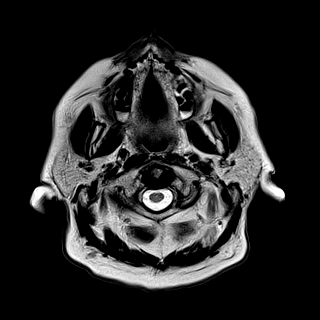
[im 25/25]
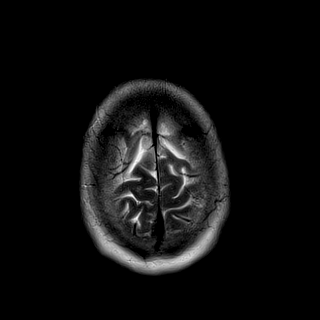

[Series 11: FLAIR · axial · 5.0mm · 0.45mm/px · z∈[-111,+33]mm · 2 of 25 slices shown]
[im 1/25]
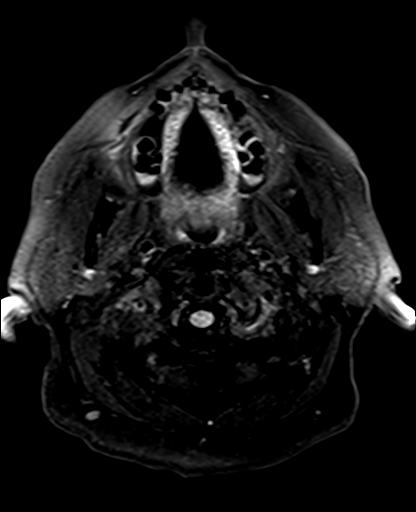
[im 25/25]
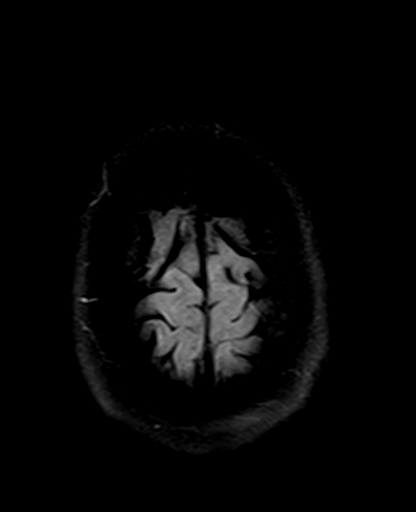

[Series 12: mag_images · axial · 3.0mm · 0.94mm/px · z∈[-113,+63]mm · 4 of 60 slices shown]
[im 1/60]
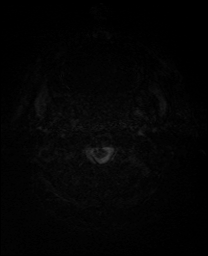
[im 20/60]
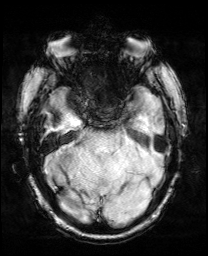
[im 40/60]
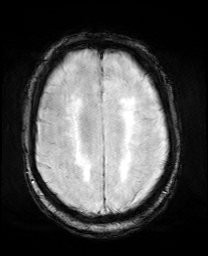
[im 60/60]
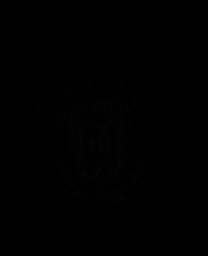

[Series 13: pha_images · axial · 3.0mm · 0.94mm/px · z∈[-110,+60]mm · 4 of 58 slices shown]
[im 1/58]
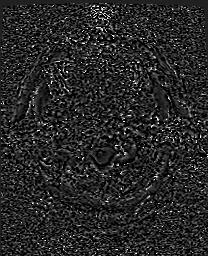
[im 20/58]
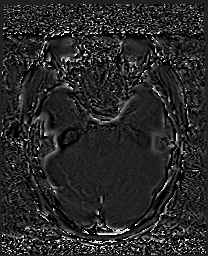
[im 39/58]
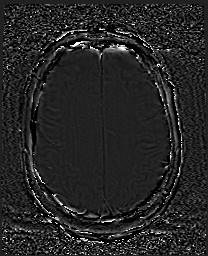
[im 58/58]
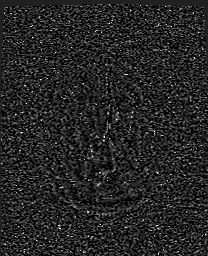

[Series 14: swi_images · axial · 3.0mm · 0.94mm/px · z∈[-113,+63]mm · 4 of 60 slices shown]
[im 1/60]
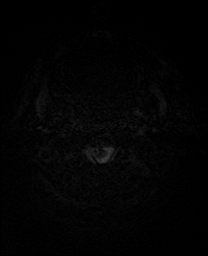
[im 20/60]
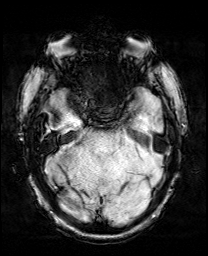
[im 40/60]
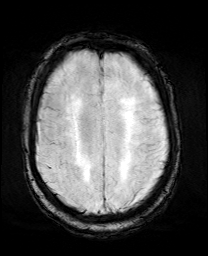
[im 60/60]
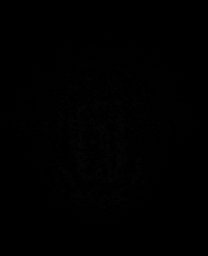

[Series 15: mip_images(sw) · axial · 24.0mm · 0.94mm/px · z∈[-103,+53]mm · 4 of 53 slices shown]
[im 1/53]
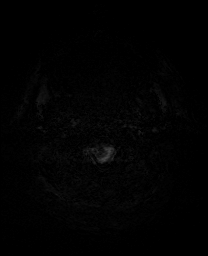
[im 18/53]
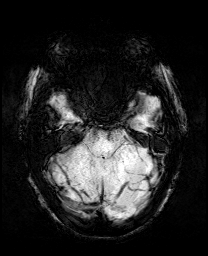
[im 35/53]
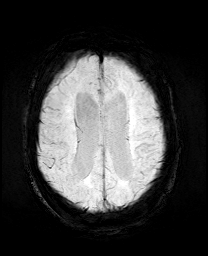
[im 53/53]
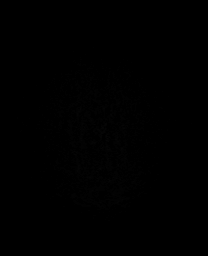

[Series 17: T2 · coronal · 5.0mm · 0.34mm/px · 2 of 29 slices shown (2 of 2)]
[im 1/29]
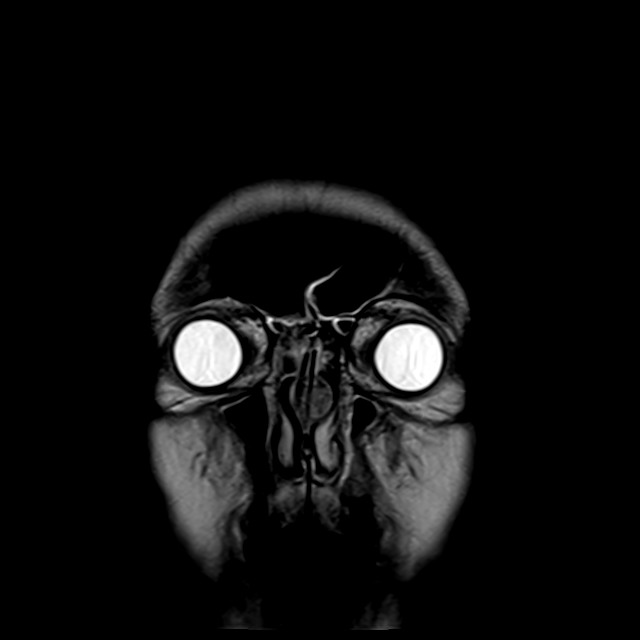
[im 29/29]
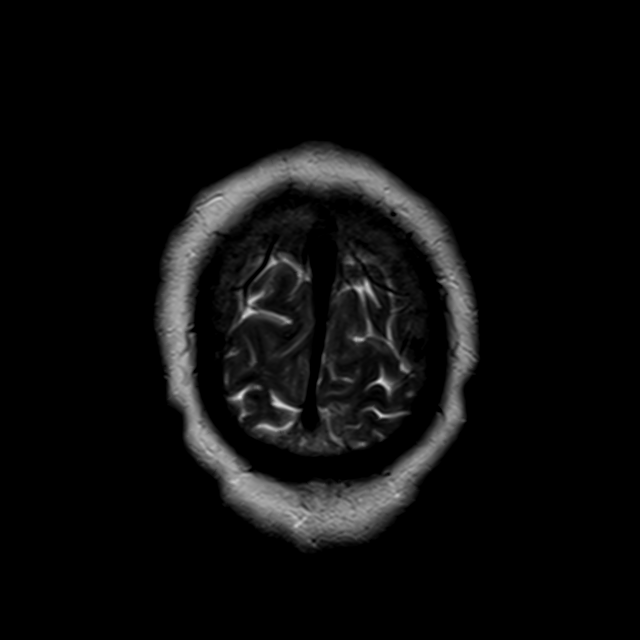

[44 of 48 positions shown; findings below may reference images not displayed]

FINDINGS: Brain: Acute infarct in the posterior limb of the left internal
capsule. Trace edema without mass effect. No acute hemorrhage. No
mass lesion or abnormal mass effect. Neuro extra-axial fluid
collection. Moderate atrophy with ex vacuo ventricular dilation,
similar to priors. Moderate patchy T2/FLAIR hyperintensities within
the white matter, most likely related to chronic microvascular
ischemic disease.

Vascular: Major arterial flow voids maintained at the skull base.

Skull and upper cervical spine: Normal marrow signal.

Sinuses/Orbits: Moderate ethmoid air cell mucosal thickening with
milder mucosal thickening of the left maxillary sinus, sphenoid and
frontal sinuses. No air-fluid levels. Unremarkable orbits.

Other: No sizable mastoid effusions.
IMPRESSION: 1. Acute infarct in the posterior limb of the left internal
capsule.Trace edema without mass effect.
2. Moderate atrophy and chronic microvascular ischemic disease.
3. Paranasal sinus disease, as described above.

## 2021-05-11 IMAGING — DX DG CHEST 1V PORT
1 series · 1 of 1 positions shown · non-contrast
Comparison: [DATE]

CLINICAL DATA: Recent fall while on blood thinners

EXAM:
PORTABLE CHEST 1 VIEW

[chest]
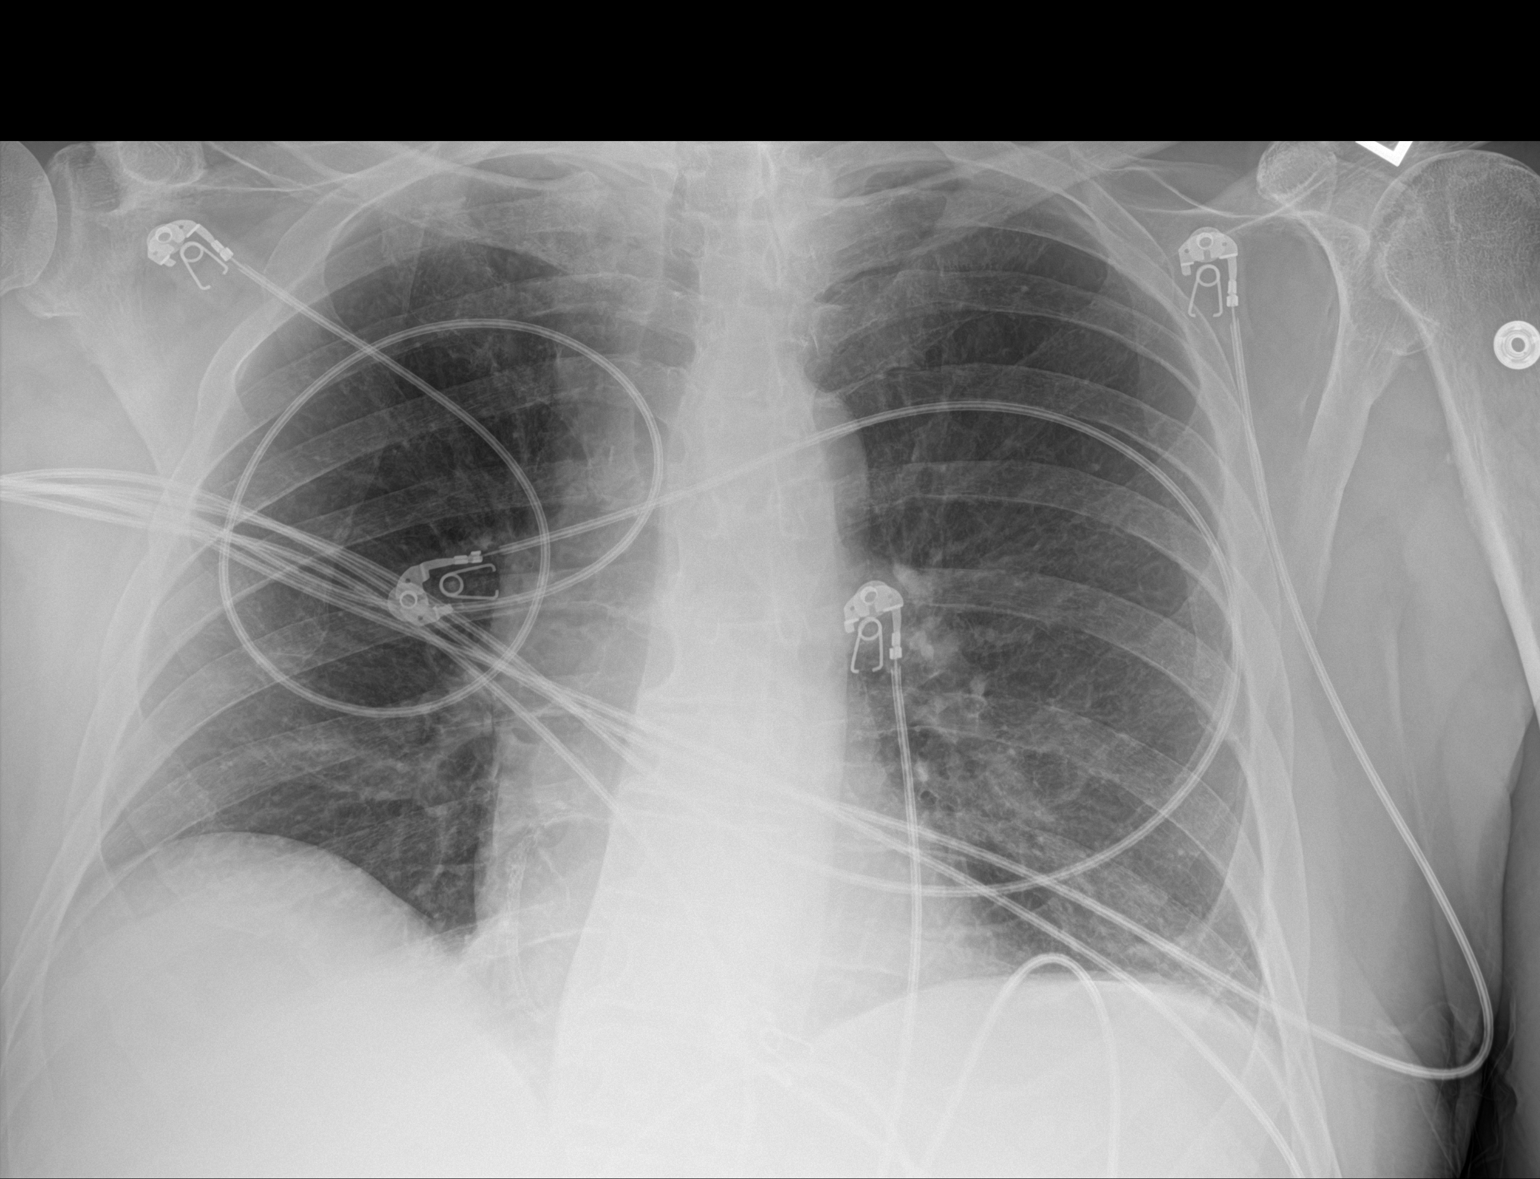

[1 of 1 positions shown; findings below may reference images not displayed]

FINDINGS: Cardiac shadow is stable. Lungs are well aerated bilaterally. No
focal infiltrate or sizable effusion is seen. Stable scarring in the
left base is noted. No bony abnormality is noted.
IMPRESSION: Stable left basilar scarring.  No acute abnormality seen.

## 2021-05-11 IMAGING — DX DG PORTABLE PELVIS
1 series · 1 of 1 positions shown · non-contrast
Comparison: None.

CLINICAL DATA: Recent fall, initial encounter

EXAM:
PORTABLE PELVIS 1-2 VIEWS

[pelvis]
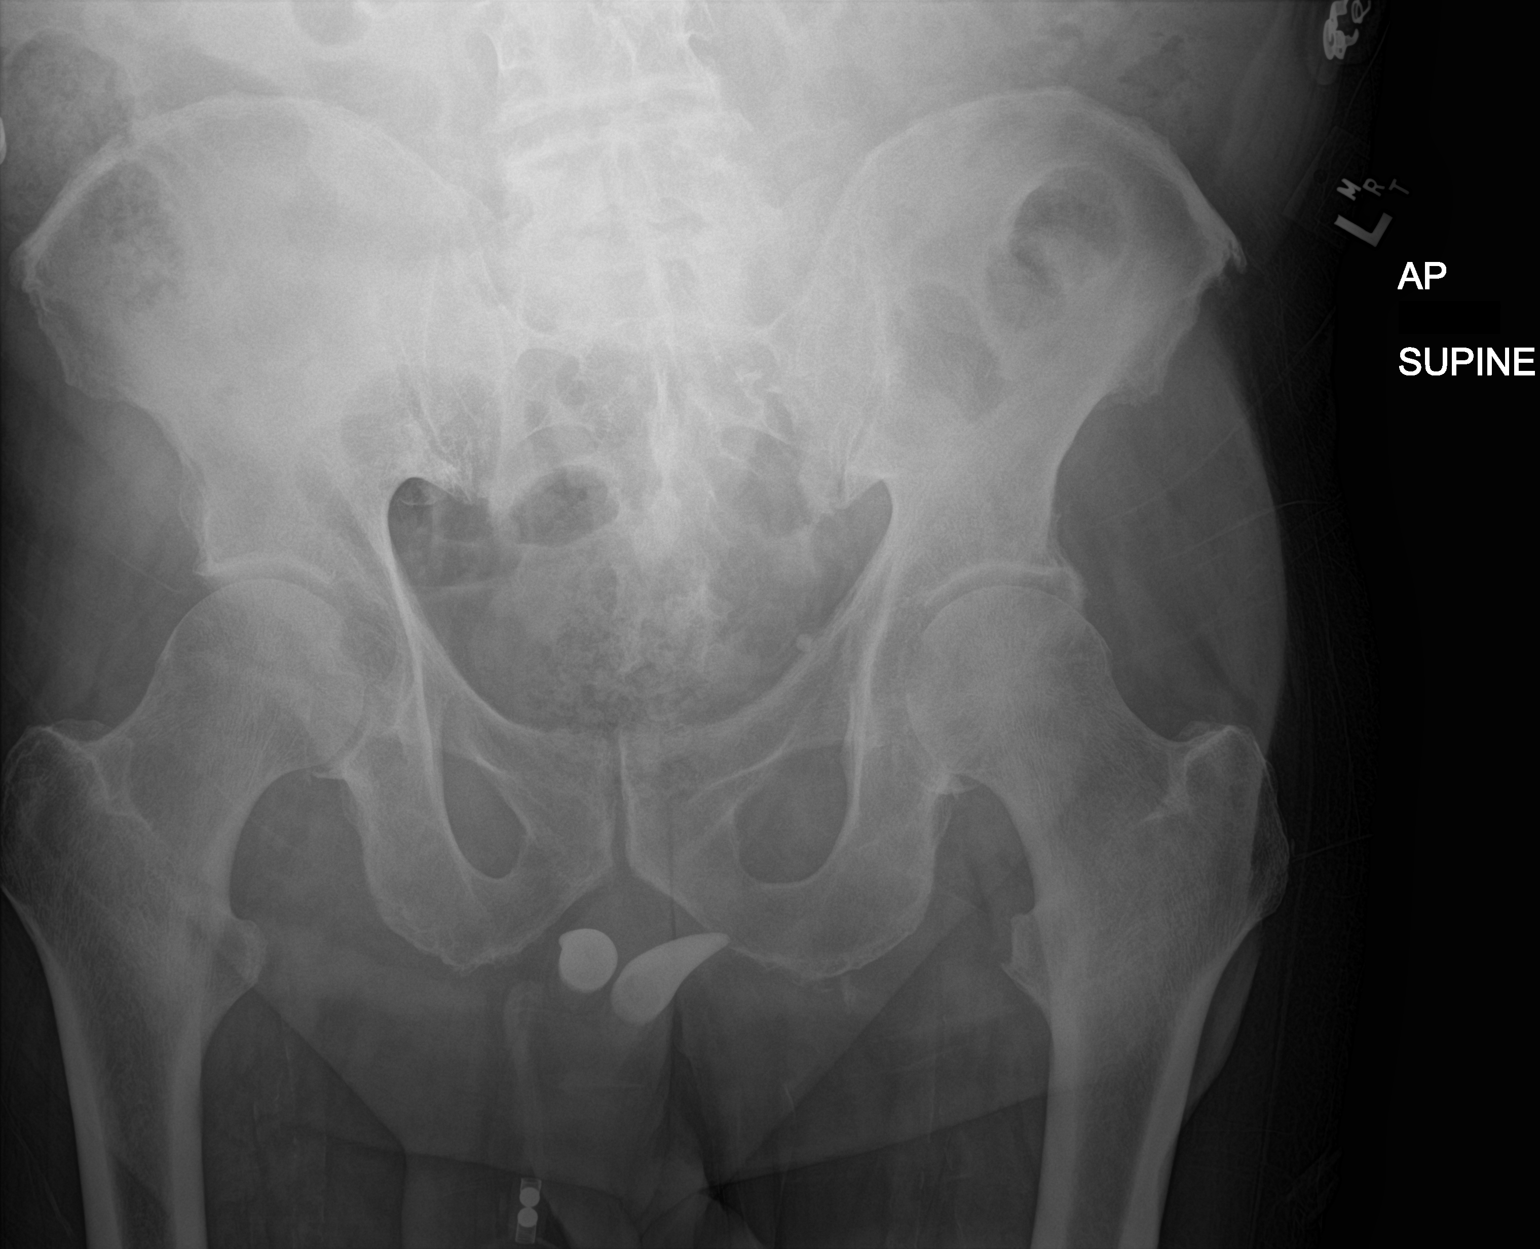

[1 of 1 positions shown; findings below may reference images not displayed]

FINDINGS: Pelvic ring is intact. Penile implant is noted. Mild degenerative
changes of the hip joints are noted as well as the lower lumbar
spine. No acute fracture or dislocation is seen.
IMPRESSION: Degenerative change without acute bony abnormality.

## 2021-05-11 MED ORDER — SODIUM CHLORIDE 0.9 % IV BOLUS
500.0000 mL | Freq: Once | INTRAVENOUS | Status: AC
  Administered 2021-05-11: 500 mL via INTRAVENOUS

## 2021-05-11 MED ORDER — SENNOSIDES-DOCUSATE SODIUM 8.6-50 MG PO TABS: 1.0000 | ORAL_TABLET | Freq: Every evening | ORAL | Status: DC | PRN

## 2021-05-11 MED ORDER — CLOPIDOGREL BISULFATE 75 MG PO TABS
75.0000 mg | ORAL_TABLET | Freq: Every day | ORAL | Status: DC
  Administered 2021-05-12 – 2021-05-15 (×5): 75 mg via ORAL
  Filled 2021-05-11 (×5): qty 1

## 2021-05-11 MED ORDER — ACETAMINOPHEN 160 MG/5ML PO SOLN: 650.0000 mg | ORAL | Status: DC | PRN

## 2021-05-11 MED ORDER — STROKE: EARLY STAGES OF RECOVERY BOOK
Freq: Once | Status: AC
  Filled 2021-05-11: qty 1

## 2021-05-11 MED ORDER — ACETAMINOPHEN 650 MG RE SUPP: 650.0000 mg | RECTAL | Status: DC | PRN

## 2021-05-11 MED ORDER — MEMANTINE HCL 10 MG PO TABS
5.0000 mg | ORAL_TABLET | Freq: Every day | ORAL | Status: DC
  Administered 2021-05-12 – 2021-05-15 (×5): 5 mg via ORAL
  Filled 2021-05-11 (×5): qty 1

## 2021-05-11 MED ORDER — SODIUM CHLORIDE 0.9 % IV SOLN: INTRAVENOUS | Status: DC

## 2021-05-11 MED ORDER — FENOFIBRATE 54 MG PO TABS
54.0000 mg | ORAL_TABLET | Freq: Every day | ORAL | Status: DC
  Administered 2021-05-12 (×2): 54 mg via ORAL
  Filled 2021-05-11 (×3): qty 1

## 2021-05-11 MED ORDER — ACETAMINOPHEN 325 MG PO TABS
650.0000 mg | ORAL_TABLET | ORAL | Status: DC | PRN
  Administered 2021-05-14 – 2021-05-15 (×3): 650 mg via ORAL
  Filled 2021-05-11 (×3): qty 2

## 2021-05-11 MED ORDER — INSULIN ASPART 100 UNIT/ML IJ SOLN
0.0000 [IU] | INTRAMUSCULAR | Status: DC
  Administered 2021-05-12: 2 [IU] via SUBCUTANEOUS
  Administered 2021-05-12 (×3): 3 [IU] via SUBCUTANEOUS
  Administered 2021-05-12: 2 [IU] via SUBCUTANEOUS
  Administered 2021-05-13 (×3): 3 [IU] via SUBCUTANEOUS
  Administered 2021-05-13: 2 [IU] via SUBCUTANEOUS

## 2021-05-11 MED ORDER — PANTOPRAZOLE SODIUM 20 MG PO TBEC
20.0000 mg | DELAYED_RELEASE_TABLET | Freq: Every day | ORAL | Status: DC
  Administered 2021-05-12 – 2021-05-15 (×5): 20 mg via ORAL
  Filled 2021-05-11 (×5): qty 1

## 2021-05-11 MED ORDER — APIXABAN 5 MG PO TABS
5.0000 mg | ORAL_TABLET | Freq: Two times a day (BID) | ORAL | Status: DC
  Administered 2021-05-12 – 2021-05-13 (×4): 5 mg via ORAL
  Filled 2021-05-11 (×4): qty 1

## 2021-05-11 NOTE — Progress Notes (Signed)
   05/11/21 1350  Clinical Encounter Type  Visit Type Initial;Trauma  Referral From Nurse  Consult/Referral To Chaplain   Chaplain responded to Level 2 trauma. Pt being treated and no support person present. Chaplain not currently needed. Chaplain remains available.   This note was prepared by Chaplain Resident, Dante Gang, MDiv. Chaplain remains available as needed through the on-call pager: [phone].

## 2021-05-11 NOTE — ED Notes (Signed)
Patient transported to MRI 

## 2021-05-11 NOTE — ED Notes (Signed)
Critical lactic result reported to PA

## 2021-05-11 NOTE — Progress Notes (Signed)
Stroke nurse called to bedside to evaluate stroke symptoms. CT obtained immediately after L2 activated, negative for intracranial abnormality. Decision was made not to activate code stroke due to patient symptoms starting early yesterday morning. Per patients son, he was initially taken to [facility] yesterday morning when he began having slurred speech and they did a chest xray and 2L bolus and sent him home. Overnight he fell x3 and began having a noticeable facial droop early this AM. Neurology advised to perform MRI head to determine if patient had an acute stroke. MD made aware.

## 2021-05-11 NOTE — ED Provider Notes (Signed)
Emergency Medicine Provider Triage Evaluation Note  Duane [name] , a 86 y.o. male  was evaluated in triage.  Pt complains of stroke sxs. Pt is confused to hx limited. According to ems, pt has had facial droop and slurred speech for 2 days. [place] and since then has fallen 3x.  Review of Systems  Positive: Facial droop, slurred speech, falls Negative: fever  Physical Exam  BP (!) 167/73 (BP Location: Left Arm)   Pulse 73   Temp 97.8 F (36.6 C) (Oral)   Resp 16   SpO2 96%  Gen:   Awake, no distress   Resp:  Normal effort  Other:  Right facial droop, right pronator drift, slurred speech, confused, perrl  Medical Decision Making  Medically screening exam initiated at 1:40 PM.  Appropriate orders placed.  Duane [name] was informed that the remainder of the evaluation will be completed by another provider, this initial triage assessment does not replace that evaluation, and the importance of remaining in the ED until their evaluation is complete.     Duane [name], Duane Knezevic S, PA-C 05/11/21 2015    Duane Gibbs, DO 05/14/21 1117

## 2021-05-11 NOTE — Consult Note (Addendum)
Neurology Consultation  Reason for Consult: Stroke Referring Physician: Dr. Dina Rich  CC: Falls, slurred speech, facial droop  History is obtained from: Son, chart  HPI: Duane [name] is a 86 y.o. male past history of atrial fibrillation on Eliquis, coronary artery disease status post PCI, CKD stage III, obstructive sleep apnea, diabetes, hypertension, hyperlipidemia, dementia, peripheral neuropathy, presented to the emergency room for evaluation of falls, facial droop and slurred speech. Last known well was sometime on Saturday, 05/09/2021 when his son saw him.  On Sunday, he noted that the patient was having some difficulty with his gait-at baseline he uses a wheelchair due to severe neuropathy but is able to stand to use the bathroom but he was having a tough time doing that.  He also had a fall.  He was taken to [facility], where preliminary work-up was unremarkable and he was discharged back to his assisted living facility at [facility].  Today became more weak and had 3 falls in the period of last night and was brought into the hospital for further evaluation.  Family also noticed slurred speech and some right-sided facial droop which was new and observed sometime after Saturday night. Denies any headache at this time.  Denies any head injury with the fall. Denies prior history of strokes. Maintains compliance with medications as they are administered by the assisted living.   LKW: Sometime on Saturday, 05/09/2021 tpa given?: no, outside the window Premorbid modified Rankin scale (mRS): At least 4  ROS: Full ROS was performed and is negative except as noted in the HPI.   Past Medical History:  Diagnosis Date   A-fib ([place])    Anemia    Angina pectoris (HCC)    Arthritis    Carpal tunnel syndrome    Cobalamin deficiency    Coronary artery disease    Diabetes mellitus without complication (HCC)    Eczema    Hyperlipemia    Hypertension    Kidney disease,  chronic, stage III (moderate, EGFR 30-59 ml/min) (HCC)    Lung infiltrate    Myocardial infarct, old    Prostatic hypertrophy    Sleep apnea, obstructive    Squamous cell cancer of lip         Family History  Problem Relation Age of Onset   Hypertension Other      Social History:   reports that he has never smoked. He has never used smokeless tobacco. He reports that he does not drink alcohol. No history on file for drug use.  Medications No current facility-administered medications for this encounter.  Current Outpatient Medications:    acetaminophen (TYLENOL) 500 MG tablet, Take 1,000 mg by mouth See admin instructions. 1071m twice daily and 10071mevery 6 hours as needed for fever, pain, headache, Disp: , Rfl:    apixaban (ELIQUIS) 5 MG TABS tablet, Take 5 mg by mouth 2 (two) times daily., Disp: , Rfl:    Ascorbic Acid (VITAMIN C) 1000 MG tablet, Take 1,000 mg by mouth daily., Disp: , Rfl:    B Complex-C (SUPER B-COMPLEX + VITAMIN C PO), Take 1 tablet by mouth daily. uper b complex + c 15055mablet, Disp: , Rfl:    Carboxymethylcellulose Sod PF 1 % GEL, Apply 1 application to eye 3 (three) times daily as needed (dry eyes)., Disp: , Rfl:    Cholecalciferol (VITAMIN D-3) 125 MCG (5000 UT) TABS, Take 5,000 Units by mouth daily., Disp: , Rfl:    clopidogrel (PLAVIX) 75  MG tablet, Take 75 mg by mouth daily., Disp: , Rfl:    EPINEPHrine 0.3 mg/0.3 mL IJ SOAJ injection, Inject 0.3 mg into the muscle once as needed (severe allergic reaction)., Disp: , Rfl:    fenofibrate (TRICOR) 48 MG tablet, Take 48 mg by mouth daily., Disp: , Rfl:    loperamide (IMODIUM) 2 MG capsule, Take 1-2 capsules (2-4 mg total) by mouth every 6 (six) hours as needed for diarrhea or loose stools. (Patient taking differently: Take 2 mg by mouth every 6 (six) hours as needed for diarrhea or loose stools.), Disp: 30 capsule, Rfl: 0   loratadine (CLARITIN) 10 MG tablet, Take 10 mg by mouth daily., Disp: , Rfl:     losartan (COZAAR) 100 MG tablet, Take 100 mg by mouth daily., Disp: , Rfl:    memantine (NAMENDA) 5 MG tablet, Take 5 mg by mouth daily., Disp: , Rfl:    metFORMIN (GLUCOPHAGE) 1000 MG tablet, Take 1,000 mg by mouth 2 (two) times daily with a meal., Disp: , Rfl:    metoprolol tartrate (LOPRESSOR) 25 MG tablet, Take 25 mg by mouth 2 (two) times daily., Disp: , Rfl:    naphazoline-pheniramine (VISINE-A) 0.025-0.3 % ophthalmic solution, Place 1 drop into both eyes 3 (three) times daily as needed for eye irritation., Disp: , Rfl:    nitroGLYCERIN (NITROSTAT) 0.4 MG SL tablet, Place 0.4 mg under the tongue every 5 (five) minutes as needed for chest pain., Disp: , Rfl:    Omega-3 Fatty Acids (ULTRA OMEGA 3 PO), Take 1-2 capsules by mouth See admin instructions. Ultra omega-3 517m-1000mg capsule 1 capsule in the morning, 2 capsules in the evening, Disp: , Rfl:    pantoprazole (PROTONIX) 20 MG tablet, Take 20 mg by mouth daily., Disp: , Rfl:    Probiotic Product (PROBIOTIC PO), Take 1 capsule by mouth daily. Probiotic 3 billion cell capsule, Disp: , Rfl:    QC CALCIUM 500MG-D3 500-200 MG-UNIT TABS, Take 1 tablet by mouth daily., Disp: , Rfl:    Saw Palmetto 450 MG CAPS, Take 450 mg by mouth daily., Disp: , Rfl:    sitaGLIPtin (JANUVIA) 25 MG tablet, Take 25 mg by mouth daily., Disp: , Rfl:    sotalol (BETAPACE) 80 MG tablet, Take 80 mg by mouth 2 (two) times daily., Disp: , Rfl:    Zinc 23 MG LOZG, Take 23 mg by mouth daily., Disp: , Rfl:    metoprolol tartrate (LOPRESSOR) 50 MG tablet, Take 1 tablet (50 mg total) by mouth 2 (two) times daily. (Patient not taking: No sig reported), Disp: 60 tablet, Rfl: 2  Exam: Current vital signs: BP (!) 191/91 (BP Location: Right Arm)   Pulse 67   Temp 97.8 F (36.6 C) (Oral)   Resp 16   Ht 5' 11"  (1.803 m)   Wt 89.8 kg   SpO2 100%   BMI 27.62 kg/m  Vital signs in last 24 hours: Temp:  [97.8 F (36.6 C)] 97.8 F (36.6 C) (06/27 1426) Pulse Rate:  [66-73]  67 (06/27 1813) Resp:  [10-17] 16 (06/27 1813) BP: (140-193)/(73-93) 191/91 (06/27 1813) SpO2:  [94 %-100 %] 100 % (06/27 1813) Weight:  [89.8 kg] 89.8 kg (06/27 1400)  GENERAL: Awake, alert in NAD HEENT: - Normocephalic and atraumatic, dry mm, no LN++, no Thyromegally LUNGS - Clear to auscultation bilaterally with no wheezes CV - S1S2 RRR, no m/r/g, equal pulses bilaterally. ABDOMEN - Soft, nontender, nondistended with normoactive BS Ext: warm, well perfused, intact peripheral pulses,  no edema  NEURO:  Mental Status: Awake alert oriented to self and the fact that he is in the hospital.  Could not tell me his correct age or month Language: speech is moderately dysarthric.  Naming, repetition, fluency, and comprehension intact. Cranial Nerves: PERRL. EOMI, visual fields full, right lower facial weakness facial sensation intact, hearing intact, tongue/uvula/soft palate midline, normal sternocleidomastoid and trapezius muscle strength. No evidence of tongue atrophy or fibrillations Motor: Subtle right upper extremity vertical drift.  No drift in any of the other extremities. Tone: is normal and bulk is normal Sensation- Intact to light touch bilaterally, no extinction on double simultaneous stimulation Coordination: Mildly ataxic right finger-nose-finger, otherwise no dysmetria Gait- deferred  NIHSS 1a Level of Conscious.: 0 1b LOC Questions: 2 1c LOC Commands: 0 2 Best Gaze: 0 3 Visual: 0 4 Facial Palsy:1  5a Motor Arm - left:0  5b Motor Arm - Right:1  6a Motor Leg - Left: 0 6b Motor Leg - Right: 0 7 Limb Ataxia: 1 8 Sensory: 0 9 Best Language:0  10 Dysarthria: 1 11 Extinct. and Inatten.:0  TOTAL: 6  Labs I have reviewed labs in epic and the results pertinent to this consultation are:   CBC    Component Value Date/Time   WBC 8.4 05/11/2021 1415   RBC 4.15 (L) 05/11/2021 1415   HGB 11.9 (L) 05/11/2021 1453   HCT 35.0 (L) 05/11/2021 1453   PLT 192 05/11/2021 1415    MCV 89.6 05/11/2021 1415   MCH 30.1 05/11/2021 1415   MCHC 33.6 05/11/2021 1415   RDW 14.4 05/11/2021 1415   LYMPHSABS 3.0 05/11/2021 1342   MONOABS 0.8 05/11/2021 1342   EOSABS 0.3 05/11/2021 1342   BASOSABS 0.1 05/11/2021 1342    CMP     Component Value Date/Time   NA 138 05/11/2021 1453   K 4.1 05/11/2021 1453   CL 104 05/11/2021 1453   CO2 22 05/11/2021 1415   GLUCOSE 214 (H) 05/11/2021 1453   BUN 14 05/11/2021 1453   CREATININE 1.60 (H) 05/11/2021 1453   CALCIUM 9.3 05/11/2021 1415   PROT 6.8 05/11/2021 1415   ALBUMIN 3.5 05/11/2021 1415   AST 30 05/11/2021 1415   ALT 15 05/11/2021 1415   ALKPHOS 48 05/11/2021 1415   BILITOT 0.7 05/11/2021 1415   GFRNONAA 41 (L) 05/11/2021 1415   GFRAA >60 11/04/2019 1614    Lipid Panel  No results found for: CHOL, TRIG, HDL, CHOLHDL, VLDL, LDLCALC, LDLDIRECT   Imaging I have reviewed the images obtained:  CT-scan of the brain-chronic small vessel disease CT C-spine negative for cervical spine fracture.  Multilevel spondylosis noted.  MRI examination of the brain-acute lacunar infarction in the posterior limb of left internal capsule with trace edema without mass-effect.  Moderate atrophy and chronic microvascular ischemic disease.  Paranasal sinus disease.   Assessment: 86 year old with complaints of increasing falls, difficulty maintaining balance as well as slurred speech and right facial droop noted to have a left internal capsular lacunar infarction on MRI scan. Outside the window for tPA Has a history of atrial fibrillation-is on Eliquis.  Location of the stroke suggests small vessel etiology but an embolic source could also be likely. Would benefit from a full stroke work-up.  Impression: Acute ischemic stroke likely small vessel etiology but embolism is also possible given history of A. fib. Diabetes Hypertension Hyperlipidemia  Recommendations: Admit to hospitalist Frequent neurochecks Telemetry For  prophylactic therapy: Continue Eliquis given that he is at least 2 days  into his symptoms and the stroke size is small. CTA head and neck A1c Lipid panel 2D echo High intensity statin PT OT Speech therapy Permissive hypertension-allow systolic blood pressures to be as high as up to 220 and treat only if they are higher than 220 on a as needed basis for the next 48 to 72 hours and then gradually normalize blood pressures.  Discharge blood pressure goal should be 140/90 or below. Delirium precautions-avoid using Ativan due to paradoxical side effects that they have noticed. Outpatient neurology follow-up Stroke team will follow up on the above recommendations with you tomorrow.  Plan discussed with the ED APP-S. Posey Pronto, PA-C in the ER Plan also discussed with the son at bedside.  -- Amie Portland, MD Neurologist [facility] Pager: [phone]

## 2021-05-11 NOTE — ED Notes (Signed)
Pt has had assigned bed to 3W05 for approx 1 hr. RN for [phone] unable to take report. Provided call back number [phone] for report. Will attempt report within 10-15 min.

## 2021-05-11 NOTE — H&P (Signed)
Duane [name] BVQ:[id number] DOB: 1935/01/15 DOA: 05/11/2021     PCP: [name], MD   Outpatient Specialists:   CARDS: [facility] cardiology [place] Dr. Atilano Median    Patient arrived to ER on 05/11/21 at 1318 Referred by Attending Horton, Alvin Critchley, DO   Patient coming from: Assisted living [place]  Chief Complaint:   Chief Complaint  Patient presents with   level 2 fall    HPI: Duane [name] is a 86 y.o. male with medical history significant of DM2, CKD,  CAD, OSA, A.fib on eliquis, HTN, HLD    Presented with   right facial droop, aphasia and right pronator drift started yesterday morning Was evaluated to [facility] did no had a CT He did have 3 falls today hitting his head and came to ER Last known well on Saturday, 09 May 2021 He has been having slurred speech right facial droop and when he holds food in his right hand it seems to be that his right hand is clumsy No prior strokes.  He has been taking his medications as supposed to patient is on Plavix and Eliquis.  Has  been vaccinated against COVID  and boosted   Initial COVID TEST  NEGATIVE   Lab Results  Component Value Date   SARSCOV2NAA NEGATIVE 05/11/2021   SARSCOV2NAA POSITIVE (A) 11/04/2019   [place] NEGATIVE 09/05/2019   [place] NEGATIVE 07/23/2019     Regarding pertinent Chronic problems:     Hyperlipidemia - does not tolerate statin on [place] Lipid Panel  No results found for: CHOL, TRIG, HDL, CHOLHDL, VLDL, LDLCALC, LDLDIRECT, LABVLDL   HTN on losartan, metoprol     CAD  - On  statin, betablocker, Plavix                 -  followed by cardiology               DM 2 -  Lab Results  Component Value Date   HGBA1C 6.3 (H) 07/23/2019   on   PO meds          OSA - not on  CPAP      A. Fib -  - CHA2DS2 vas score   5     current  on anticoagulation with  Eliquis,           -  Rate control:  Currently controlled with    Metoprolol,         - Rhythm control: sotalol     CKD stage IIIb- baseline Cr 1.6 Estimated Creatinine Clearance: 36.6 mL/min (A) (by C-G formula based on SCr of 1.6 mg/dL (H)).  Lab Results  Component Value Date   CREATININE 1.60 (H) 05/11/2021   CREATININE 1.64 (H) 05/11/2021   CREATININE 1.60 (H) 05/11/2021      Dementia - on  [place]   Chronic anemia - baseline hg Hemoglobin & Hematocrit  Recent Labs    05/11/21 1356 05/11/21 1415 05/11/21 1453  HGB 11.6* 12.5* 11.9*     While in ER: MRI showed CVA neurology was consulted Covid is negative Pelvis imagine is normal Lactic acid 2.8     ED Triage Vitals  Enc Vitals Group     BP 05/11/21 1335 (!) 167/73     Pulse Rate 05/11/21 1335 73     Resp 05/11/21 1335 16     Temp 05/11/21 1335 97.8 F (36.6 C)     Temp Source 05/11/21 1335 Oral  SpO2 05/11/21 1335 96 %     Weight 05/11/21 1400 198 lb (89.8 kg)     Height 05/11/21 1400 5' 11"  (1.803 m)     Head Circumference --      Peak Flow --      Pain Score 05/11/21 1410 0     Pain Loc --      Pain Edu? --      Excl. in [place]? --   TMAX(24)@     _________________________________________ Significant initial  Findings: Abnormal Labs Reviewed  APTT - Abnormal; Notable for the following components:      Result Value   aPTT 38 (*)    All other components within normal limits  CBC - Abnormal; Notable for the following components:   RBC 3.92 (*)    Hemoglobin 11.6 (*)    HCT 36.0 (*)    All other components within normal limits  COMPREHENSIVE METABOLIC PANEL - Abnormal; Notable for the following components:   CO2 20 (*)    Glucose, Bld 234 (*)    Creatinine, Ser 1.56 (*)    Albumin 3.3 (*)    GFR, Estimated 44 (*)    All other components within normal limits  URINALYSIS, ROUTINE W REFLEX MICROSCOPIC - Abnormal; Notable for the following components:   Glucose, UA >=500 (*)    All other components within normal limits  COMPREHENSIVE METABOLIC PANEL - Abnormal; Notable for the following components:   Glucose, Bld  226 (*)    Creatinine, Ser 1.64 (*)    GFR, Estimated 41 (*)    All other components within normal limits  CBC - Abnormal; Notable for the following components:   RBC 4.15 (*)    Hemoglobin 12.5 (*)    HCT 37.2 (*)    All other components within normal limits  LACTIC ACID, PLASMA - Abnormal; Notable for the following components:   Lactic Acid, Venous 2.8 (*)    All other components within normal limits  I-STAT CHEM 8, ED - Abnormal; Notable for the following components:   Creatinine, Ser 1.60 (*)    Glucose, Bld 242 (*)    Calcium, Ion 1.11 (*)    TCO2 20 (*)    Hemoglobin 11.6 (*)    HCT 34.0 (*)    All other components within normal limits  I-STAT CHEM 8, ED - Abnormal; Notable for the following components:   Creatinine, Ser 1.60 (*)    Glucose, Bld 214 (*)    Hemoglobin 11.9 (*)    HCT 35.0 (*)    All other components within normal limits   ____________________________________________ Ordered CT HEAD  NON acute  CXR -  NON acute MRI showing Acute infarct in the posterior limb of the left internal capsule.Trace edema without mass effect.     ECG: Ordered Personally reviewed by me showing: HR : 74 Rhythm:  NSR,   no evidence of ischemic changes QTC 483 _________________   The recent clinical data is shown below. Vitals:   05/11/21 1630 05/11/21 1645 05/11/21 1700 05/11/21 1813  BP: (!) 187/83 (!) 171/92 (!) 193/93 (!) 191/91  Pulse: 70 69 67 67  Resp: 17 17 10 16   Temp:      TempSrc:      SpO2: 97% 97% 97% 100%  Weight:      Height:           WBC     Component Value Date/Time   WBC 8.4 05/11/2021 1415   LYMPHSABS 3.0 05/11/2021  1342   MONOABS 0.8 05/11/2021 1342   EOSABS 0.3 05/11/2021 1342   BASOSABS 0.1 05/11/2021 1342     Lactic Acid, Venous    Component Value Date/Time   LATICACIDVEN 2.8 (HH) 05/11/2021 1415          UA  no evidence of UTI      Urine analysis:    Component Value Date/Time   COLORURINE YELLOW 05/11/2021 1610    APPEARANCEUR CLEAR 05/11/2021 1610   LABSPEC 1.011 05/11/2021 1610   PHURINE 6.0 05/11/2021 1610   GLUCOSEU >=500 (A) 05/11/2021 1610   HGBUR NEGATIVE 05/11/2021 1610   BILIRUBINUR NEGATIVE 05/11/2021 1610   KETONESUR NEGATIVE 05/11/2021 1610   PROTEINUR NEGATIVE 05/11/2021 1610   NITRITE NEGATIVE 05/11/2021 1610   LEUKOCYTESUR NEGATIVE 05/11/2021 1610    Results for orders placed or performed during the hospital encounter of 05/11/21  Resp Panel by RT-PCR (Flu A&B, Covid) Nasopharyngeal Swab     Status: None   Collection Time: 05/11/21  2:50 PM   Specimen: Nasopharyngeal Swab; Nasopharyngeal(NP) swabs in vial transport medium  Result Value Ref Range Status   SARS Coronavirus 2 by RT PCR NEGATIVE NEGATIVE Final         Influenza A by PCR NEGATIVE NEGATIVE Final   Influenza B by PCR NEGATIVE NEGATIVE Final           _______________________________________________________ ER Provider Called:   Neurology  Dr. Rory Percy They Recommend admit to medicine   Will    SEEN in ER _______________________________________________ Hospitalist was called for admission for CVA  The following Work up has been ordered so far:  Orders Placed This Encounter  Procedures   Critical Care   Resp Panel by RT-PCR (Flu A&B, Covid) Nasopharyngeal Swab   Resp Panel by RT-PCR (Flu A&B, Covid) Nasopharyngeal Swab   DG Pelvis Portable   CT HEAD WO CONTRAST   CT CERVICAL SPINE WO CONTRAST   DG Chest Port 1 View   MR Brain Wo Contrast (neuro protocol)   Ethanol   Protime-INR   APTT   CBC   Differential   Comprehensive metabolic panel   Urine rapid drug screen (hosp performed)   Urinalysis, Routine w reflex microscopic   Comprehensive metabolic panel   CBC   Ethanol   Urinalysis, Routine w reflex microscopic   Lactic acid, plasma   Protime-INR   Diet NPO time specified   Vital signs   Cardiac Monitoring   Modified Stroke Scale (mNIHSS) Document mNIHSS assessment every 2 hours for a total of 12  hours   Stroke swallow screen   Initiate Carrier Fluid Protocol   If O2 sat If O2 Sat < 94%, administer O2 at 2 liters/minute via nasal cannula.   Cardiac monitoring   Measure blood pressure   Initiate Carrier Fluid Protocol   Consult for [facility] Admission   Pulse oximetry, continuous   Pulse oximetry, continuous   I-stat chem 8, ED   I-Stat Chem 8, ED   EKG 12-Lead   ED EKG   Sample to Blood Bank   Saline lock IV     Following Medications were ordered in ER: Medications - No data to display     OTHER Significant initial  Findings:  labs showing:    Recent Labs  Lab 05/11/21 1342 05/11/21 1356 05/11/21 1415 05/11/21 1453  NA 135 136 137 138  K 4.2 4.1 4.5 4.1  CO2 20*  --  22  --   GLUCOSE 234* 242* 226*  214*  BUN 15 15 15 14   CREATININE 1.56* 1.60* 1.64* 1.60*  CALCIUM 8.9  --  9.3  --     Cr stable,    Lab Results  Component Value Date   CREATININE 1.60 (H) 05/11/2021   CREATININE 1.64 (H) 05/11/2021   CREATININE 1.60 (H) 05/11/2021    Recent Labs  Lab 05/11/21 1342 05/11/21 1415  AST 25 30  ALT 16 15  ALKPHOS 49 48  BILITOT 0.6 0.7  PROT 6.5 6.8  ALBUMIN 3.3* 3.5   Lab Results  Component Value Date   CALCIUM 9.3 05/11/2021          Plt: Lab Results  Component Value Date   PLT 192 05/11/2021       COVID-19 Labs  No results for input(s): DDIMER, FERRITIN, LDH, CRP in the last 72 hours.  Lab Results  Component Value Date   SARSCOV2NAA NEGATIVE 05/11/2021   SARSCOV2NAA POSITIVE (A) 11/04/2019   SARSCOV2NAA NEGATIVE 09/05/2019   [place] NEGATIVE 07/23/2019        Recent Labs  Lab 05/11/21 1342 05/11/21 1356 05/11/21 1415 05/11/21 1453  WBC 9.1  --  8.4  --   NEUTROABS 5.0  --   --   --   HGB 11.6* 11.6* 12.5* 11.9*  HCT 36.0* 34.0* 37.2* 35.0*  MCV 91.8  --  89.6  --   PLT 206  --  192  --     HG/HCT * stable,  Down *Up from baseline see below    Component Value Date/Time   HGB 11.9 (L) 05/11/2021  1453   HCT 35.0 (L) 05/11/2021 1453   MCV 89.6 05/11/2021 1415      DM  labs:  HbA1C: No results for input(s): HGBA1C in the last 8760 hours.     CBG (last 3)  No results for input(s): GLUCAP in the last 72 hours.        Cultures:    Component Value Date/Time   SDES URINE, RANDOM 09/05/2019 1751   SPECREQUEST  09/05/2019 1751    NONE Performed at [facility], [place] [address], [place], [place] [address]    CULT >=100,000 COLONIES/mL KLEBSIELLA PNEUMONIAE (A) 09/05/2019 1751   REPTSTATUS 09/07/2019 FINAL 09/05/2019 1751     Radiological Exams on Admission: CT HEAD WO CONTRAST  Result Date: 05/11/2021 CLINICAL DATA:  Fall.  Head trauma EXAM: CT HEAD WITHOUT CONTRAST CT CERVICAL SPINE WITHOUT CONTRAST TECHNIQUE: Multidetector CT imaging of the head and cervical spine was performed following the standard protocol without intravenous contrast. Multiplanar CT image reconstructions of the cervical spine were also generated. COMPARISON:  07/23/2019 FINDINGS: CT HEAD FINDINGS Brain: Generalized atrophy. Moderate white matter hypodensity bilaterally, unchanged. Mild ventricular enlargement due to atrophy, unchanged. Negative for acute infarct, hemorrhage, mass Vascular: Negative for hyperdense vessel Skull: Negative Sinuses/Orbits: Mild mucosal edema paranasal sinuses. Bilateral cataract extraction. Other: None CT CERVICAL SPINE FINDINGS Alignment: Mild anterolisthesis C4-5 Skull base and vertebrae: Negative for fracture Soft tissues and spinal canal: Negative Disc levels: Multilevel disc and facet degeneration. Foraminal stenosis bilaterally most prominent C4-5 and C5-6 due to spurring. Upper chest: Lung apices clear bilaterally. Other: None IMPRESSION: 1. No acute intracranial abnormality. Atrophy with chronic microvascular ischemic change in the white matter 2. Negative for cervical spine fracture.  Multilevel spondylosis. Electronically Signed   By: Franchot Gallo M.D.   On: 05/11/2021  15:07   CT CERVICAL SPINE WO CONTRAST  Result Date: 05/11/2021 CLINICAL DATA:  Fall.  Head trauma EXAM: CT HEAD WITHOUT CONTRAST CT CERVICAL SPINE WITHOUT CONTRAST TECHNIQUE: Multidetector CT imaging of the head and cervical spine was performed following the standard protocol without intravenous contrast. Multiplanar CT image reconstructions of the cervical spine were also generated. COMPARISON:  07/23/2019 FINDINGS: CT HEAD FINDINGS Brain: Generalized atrophy. Moderate white matter hypodensity bilaterally, unchanged. Mild ventricular enlargement due to atrophy, unchanged. Negative for acute infarct, hemorrhage, mass Vascular: Negative for hyperdense vessel Skull: Negative Sinuses/Orbits: Mild mucosal edema paranasal sinuses. Bilateral cataract extraction. Other: None CT CERVICAL SPINE FINDINGS Alignment: Mild anterolisthesis C4-5 Skull base and vertebrae: Negative for fracture Soft tissues and spinal canal: Negative Disc levels: Multilevel disc and facet degeneration. Foraminal stenosis bilaterally most prominent C4-5 and C5-6 due to spurring. Upper chest: Lung apices clear bilaterally. Other: None IMPRESSION: 1. No acute intracranial abnormality. Atrophy with chronic microvascular ischemic change in the white matter 2. Negative for cervical spine fracture.  Multilevel spondylosis. Electronically Signed   By: Franchot Gallo M.D.   On: 05/11/2021 15:07   MR Brain Wo Contrast (neuro protocol)  Result Date: 05/11/2021 CLINICAL DATA:  Neuro deficit, acute stroke suspected. EXAM: MRI HEAD WITHOUT CONTRAST TECHNIQUE: Multiplanar, multiecho pulse sequences of the brain and surrounding structures were obtained without intravenous contrast. COMPARISON:  Same day CT head.  MRI 07/23/2019. FINDINGS: Brain: Acute infarct in the posterior limb of the left internal capsule. Trace edema without mass effect. No acute hemorrhage. No mass lesion or abnormal mass effect. Neuro extra-axial fluid collection. Moderate atrophy  with ex vacuo ventricular dilation, similar to priors. Moderate patchy T2/FLAIR hyperintensities within the white matter, most likely related to chronic microvascular ischemic disease. Vascular: Major arterial flow voids maintained at the skull base. Skull and upper cervical spine: Normal marrow signal. Sinuses/Orbits: Moderate ethmoid air cell mucosal thickening with milder mucosal thickening of the left maxillary sinus, sphenoid and frontal sinuses. No air-fluid levels. Unremarkable orbits. Other: No sizable mastoid effusions. IMPRESSION: 1. Acute infarct in the posterior limb of the left internal capsule.Trace edema without mass effect. 2. Moderate atrophy and chronic microvascular ischemic disease. 3. Paranasal sinus disease, as described above. Electronically Signed   By: Margaretha Sheffield MD   On: 05/11/2021 18:03   DG Pelvis Portable  Result Date: 05/11/2021 CLINICAL DATA:  Recent fall, initial encounter EXAM: PORTABLE PELVIS 1-2 VIEWS COMPARISON:  None. FINDINGS: Pelvic ring is intact. Penile implant is noted. Mild degenerative changes of the hip joints are noted as well as the lower lumbar spine. No acute fracture or dislocation is seen. IMPRESSION: Degenerative change without acute bony abnormality. Electronically Signed   By: Inez Catalina M.D.   On: 05/11/2021 14:45   DG Chest Port 1 View  Result Date: 05/11/2021 CLINICAL DATA:  Recent fall while on blood thinners EXAM: PORTABLE CHEST 1 VIEW COMPARISON:  05/10/2021 FINDINGS: Cardiac shadow is stable. Lungs are well aerated bilaterally. No focal infiltrate or sizable effusion is seen. Stable scarring in the left base is noted. No bony abnormality is noted. IMPRESSION: Stable left basilar scarring.  No acute abnormality seen. Electronically Signed   By: Inez Catalina M.D.   On: 05/11/2021 14:45   _______________________________________________________________________________________________________ Latest  Blood pressure (!) 191/91, pulse 67,  temperature 97.8 F (36.6 C), temperature source Oral, resp. rate 16, height 5' 11"  (1.803 m), weight 89.8 kg, SpO2 100 %.   Review of Systems:    Pertinent positives include:  localizing neurological complaints, weakness gait abnormality,  slurred speech,  Constitutional:  No weight loss, night  sweats, Fevers, chills, fatigue, weight loss  HEENT:  No headaches, Difficulty swallowing,Tooth/dental problems,Sore throat,  No sneezing, itching, ear ache, nasal congestion, post nasal drip,  Cardio-vascular:  No chest pain, Orthopnea, PND, anasarca, dizziness, palpitations.no Bilateral lower extremity swelling  GI:  No heartburn, indigestion, abdominal pain, nausea, vomiting, diarrhea, change in bowel habits, loss of appetite, melena, blood in stool, hematemesis Resp:  no shortness of breath at rest. No dyspnea on exertion, No excess mucus, no productive cough, No non-productive cough, No coughing up of blood.No change in color of mucus.No wheezing. Skin:  no rash or lesions. No jaundice GU:  no dysuria, change in color of urine, no urgency or frequency. No straining to urinate.  No flank pain.  Musculoskeletal:  No joint pain or no joint swelling. No decreased range of motion. No back pain.  Psych:  No change in mood or affect. No depression or anxiety. No memory loss.  Neuro: no no tingling, no , no double vision,    All systems reviewed and apart from [place] all are negative _______________________________________________________________________________________________ Past Medical History:   Past Medical History:  Diagnosis Date   A-fib ([place])    Anemia    Angina pectoris (HCC)    Arthritis    Carpal tunnel syndrome    Cobalamin deficiency    Coronary artery disease    Diabetes mellitus without complication (HCC)    Eczema    Hyperlipemia    Hypertension    Kidney disease, chronic, stage III (moderate, EGFR 30-59 ml/min) (HCC)    Lung infiltrate    Myocardial infarct, old     Prostatic hypertrophy    Sleep apnea, obstructive    Squamous cell cancer of lip       Past Surgical History:  Procedure Laterality Date   CORONARY ANGIOPLASTY WITH STENT PLACEMENT      Social History:  Ambulatory walker       reports that he has never smoked. He has never used smokeless tobacco. He reports that he does not drink alcohol. No history on file for drug use.     Family History:   Family History  Problem Relation Age of Onset   Hypertension Other    ______________________________________________________________________________________________ Allergies: Allergies  Allergen Reactions   Baclofen Other (See Comments)    Severe AMS, comatose, intubated   Bee Venom Anaphylaxis   Contrast Media [Iodinated Diagnostic Agents] Anaphylaxis   Iodine-131 Anaphylaxis   Metrizamide Anaphylaxis   Metronidazole Anaphylaxis   Atorvastatin Other (See Comments)    Aching in legs   Lisinopril Cough     Prior to Admission medications   Medication Sig Start Date End Date Taking? Authorizing Provider  acetaminophen (TYLENOL) 500 MG tablet Take 1,000 mg by mouth See admin instructions. 1053m twice daily and 10030mevery 6 hours as needed for fever, pain, headache   Yes [provider]  apixaban (ELIQUIS) 5 MG TABS tablet Take 5 mg by mouth 2 (two) times daily.   Yes [provider]  Ascorbic Acid (VITAMIN C) 1000 MG tablet Take 1,000 mg by mouth daily.   Yes [provider]  B Complex-C (SUPER B-COMPLEX + VITAMIN C PO) Take 1 tablet by mouth daily. uper b complex + c 15059mablet 04/21/21  Yes [provider]  Carboxymethylcellulose Sod PF 1 % GEL Apply 1 application to eye 3 (three) times daily as needed (dry eyes).   Yes [provider]  Cholecalciferol (VITAMIN D-3) 125 MCG (5000 UT) TABS Take 5,000 Units  by mouth daily.   Yes [provider]  clopidogrel (PLAVIX) 75 MG tablet Take 75 mg by mouth daily.   Yes [provider]  EPINEPHrine 0.3 mg/0.3 mL IJ SOAJ injection Inject 0.3 mg into the muscle once as needed (severe allergic reaction).   Yes [provider]  fenofibrate (TRICOR) 48 MG tablet Take 48 mg by mouth daily.   Yes [provider]  loperamide (IMODIUM) 2 MG capsule Take 1-2 capsules (2-4 mg total) by mouth every 6 (six) hours as needed for diarrhea or loose stools. Patient taking differently: Take 2 mg by mouth every 6 (six) hours as needed for diarrhea or loose stools. 09/07/19  Yes Christian, Rylee, MD  loratadine (CLARITIN) 10 MG tablet Take 10 mg by mouth daily.   Yes [provider]  losartan (COZAAR) 100 MG tablet Take 100 mg by mouth daily.   Yes [provider]  memantine (NAMENDA) 5 MG tablet Take 5 mg by mouth daily.   Yes [provider]  metFORMIN (GLUCOPHAGE) 1000 MG tablet Take 1,000 mg by mouth 2 (two) times daily with a meal. 07/05/16  Yes [provider]  metoprolol tartrate (LOPRESSOR) 25 MG tablet Take 25 mg by mouth 2 (two) times daily.   Yes [provider]  naphazoline-pheniramine (VISINE-A) 0.025-0.3 % ophthalmic solution Place 1 drop into both eyes 3 (three) times daily as needed for eye irritation.   Yes [provider]  nitroGLYCERIN (NITROSTAT) 0.4 MG SL tablet Place 0.4 mg under the tongue every 5 (five) minutes as needed for chest pain.   Yes [provider]  Omega-3 Fatty Acids (ULTRA OMEGA 3 PO) Take 1-2 capsules by mouth See admin instructions. Ultra omega-3 562m-1000mg capsule 1 capsule in the morning, 2 capsules in the evening   Yes [provider]  pantoprazole (PROTONIX) 20 MG tablet Take 20 mg by mouth daily.   Yes [provider]  Probiotic Product (PROBIOTIC PO) Take 1 capsule by mouth daily. Probiotic 3 billion cell capsule   Yes [provider]  QC CALCIUM 500MG-D3 500-200 MG-UNIT TABS Take 1 tablet by mouth daily. 04/10/21  Yes [provider]  Saw Palmetto 450 MG CAPS Take 450 mg by mouth daily.   Yes [provider]  sitaGLIPtin (JANUVIA) 25 MG tablet Take 25 mg by mouth daily.   Yes [provider]  sotalol (BETAPACE) 80 MG tablet Take 80 mg by mouth 2 (two) times daily.   Yes [provider]  Zinc 23 MG LOZG Take 23 mg by mouth daily. 10/13/19  Yes [provider]  metoprolol tartrate (LOPRESSOR) 50 MG tablet Take 1 tablet (50 mg total) by mouth 2 (two) times daily. Patient not taking: No sig reported 07/27/19   AHosie Poisson MD    ___________________________________________________________________________________________________ Physical Exam: Vitals with BMI 05/11/2021 05/11/2021 05/11/2021  Height - - -  Weight - - -  BMI - - -  Systolic [id number] Diastolic 91 93 92  Pulse 67 67 69     1. General:  in No  Acute distress   Chronically ill   2. Psychological: Alert and  Oriented 3. Head/ENT:   Dry Mucous Membranes                          Head Non traumatic, neck supple  Poor Dentition 4. SKIN:  decreased Skin turgor,  Skin clean Dry and intact no rash 5. Heart: Regular rate and rhythm no Murmur, no Rub or gallop 6. Lungs: Clear to auscultation bilaterally, no wheezes or crackles   7. Abdomen: Soft,  non-tender, Non distended  bowel sounds present 8. Lower extremities: no clubbing, cyanosis, no  edema 9. Neurologically  righ pronator drift noted and right facial droop 10. MSK: Normal range of motion    Chart has been reviewed  ______________________________________________________________________________________________  Assessment/Plan 86 y.o. male with medical history significant of DM2, CKD,  CAD, OSA, A.fib on eliquis, HTN, HLD Admitted for CVA  Present on Admission:  Stroke (cerebrum) ([place]) -  - will admit based on TIA/CVA protocol,         Monitor on Tele       MRA/MRI  Resulted - showing acute ischemic CVA              Echo  to evaluate for possible embolic source,        obtain cardiac enzymes,  ECG,   Lipid panel, TSH.        Order PT/OT evaluation.        passed swallow eval         Will make sure patient is on   Plavix agent does not tolerate statin       Allow permissive Hypertension keep BP <220/120        Neurology consulted Have seen pt in ER     PAF (paroxysmal atrial fibrillation) ([place]) - continue eliquis and restart home meds when able    Essential hypertension - allow permissive HTN    CKD vs AKI (chronic kidney disease), stage III ([place]) -  -chronic avoid nephrotoxic medications such as NSAIDs, Vanco Zosyn combo,  avoid hypotension, continue to follow renal function Obtain urine electrolytes   Hyperlipidemia - on tricor family states does not tolerate statin makes him unable to walk  Touched base with neurology per Dr. Curly [place] would recommend trying a low dose again if LDL still > 70 or consider PSK-9inihibitor on an outpatient basis if truly not able to tolerate statin;   Hx of CAD -  - chronic, restart BB and Plavix  Dementia - continue home meds and monitor for sundowing.   DM 2 -  - Order  SSI   -  check TSH and HgA1C  - Hold by mouth medications   Other plan as per orders.  DVT prophylaxis:  SCD      Code Status:    Code Status: Prior FULL CODE  as per patient   I had personally discussed CODE STATUS with patient     Family Communication:   Family not at  Bedside  plan of care was discussed  with  Son,    Disposition Plan:      Back to current facility when stable                              Following barriers for discharge:                                                       Will need consultants to evaluate patient prior to discharge   Would benefit from PT/OT eval  prior to DC  Ordered                   Swallow eval - SLP ordered                    Transition of care consulted                                       Consults called: neurology is aware  Admission  status:  ED Disposition     ED Disposition  [place]: [place] [[id number]]  Level of Care: Telemetry Medical [[id number]]  May admit patient to [facility] or [facility] if equivalent level of care is available:: No  Covid Evaluation: Confirmed COVID Negative  Diagnosis: Stroke (cerebrum) [facility]) [[id number]]  Admitting Physician: Toy Baker [[id number]]  Attending Physician: Toy Baker [[id number]]  Estimated length of stay: past midnight tomorrow  Certification:: I certify this patient will need inpatient services for at least 2 midnights            inpatient     I Expect 2 midnight stay secondary to severity of patient's current illness need for inpatient interventions justified by the following:    Severe lab/radiological/exam abnormalities including:    New CVA and extensive comorbidities including:  DM2  * CHF  CAD     CKD   dementia   Chronic anticoagulation  That are currently affecting medical management.   I expect  patient to be hospitalized for 2 midnights requiring inpatient medical care.  Patient is at high risk for adverse outcome (such as loss of life or disability) if not treated.  Indication for inpatient stay as follows:  New CVA  Need for   IV fluids,     Level of care   tele indefinitely please discontinue once patient no longer qualifies COVID-19 Labs    Lab Results  Component Value Date   [place] NEGATIVE 05/11/2021     Precautions: admitted as  Covid Negative     PPE: Used by the provider:   N95  eye Goggles,  Gloves    Jasneet Schobert 05/11/2021, 8:58 PM    [facility]      after 2 AM please page floor coverage PA If 7AM-7PM, please contact the day team taking care of the patient using [email or web address]   Patient was evaluated in the context of the global COVID-19 pandemic, which necessitated consideration that the patient might be at risk for infection with the  SARS-CoV-2 virus that causes COVID-19. Institutional protocols and algorithms that pertain to the evaluation of patients at risk for COVID-19 are in a state of rapid change based on information released by regulatory bodies including the CDC and federal and state organizations. These policies and algorithms were followed during the patient's care.

## 2021-05-11 NOTE — ED Provider Notes (Signed)
[place] EMERGENCY DEPARTMENT Provider Note   CSN: [id number] Arrival date & time: 05/11/21  1318     History Chief Complaint  Patient presents with   level 2 fall    Duane [name] is a 86 y.o. male with prior past medical history of A. fib on Eliquis and Plavix, diabetes, CAD, mild dementia, hypertension that presents emerged department today for strokelike symptoms.  Patient is level 5 caveat due to dementia.  Son is in the room who is able to tell most of HPI.  Son states that patient started having slurred speech yesterday morning, states that they took him to the hospital and they did not do a CT scan.  States that he then noticed this morning around 9:00 he continued to have slurred speech, and now also had a right facial droop.  States that that was not there before.  He states that he has fell 3 times since leaving the hospital.  Did hit his head.  Patient is on blood thinners as previously noted.  Patient leveled as a level 2 trauma.  Patient is not complaining of any pain anywhere. Out of stroke [place], has been 29 hours.    HPI     Past Medical History:  Diagnosis Date   A-fib ([place])    Anemia    Angina pectoris ([place])    Arthritis    Carpal tunnel syndrome    Cobalamin deficiency    Coronary artery disease    Diabetes mellitus without complication ([place])    Eczema    Hyperlipemia    Hypertension    Kidney disease, chronic, stage III (moderate, EGFR 30-59 ml/min) (HCC)    Lung infiltrate    Myocardial infarct, old    Prostatic hypertrophy    Sleep apnea, obstructive    Squamous cell cancer of lip     Patient Active Problem List   Diagnosis Date Noted   Stroke (cerebrum) ([place]) 05/11/2021   Hyponatremia 09/05/2019   Diarrhea 09/05/2019   UTI (urinary tract infection) 09/05/2019   Encephalopathy 07/23/2019   Essential hypertension 09/08/2016   PAF (paroxysmal atrial fibrillation) ([place]) 09/08/2016   Diabetes mellitus type 2, controlled  ([place]) 09/08/2016   ARF (acute renal failure) ([place]) 09/08/2016   Normochromic normocytic anemia 09/08/2016   Abdominal pain 09/08/2016    Past Surgical History:  Procedure Laterality Date   CORONARY ANGIOPLASTY WITH STENT PLACEMENT         Family History  Problem Relation Age of Onset   Hypertension Other     Social History   Tobacco Use   Smoking status: Never   Smokeless tobacco: Never  Substance Use Topics   Alcohol use: No    Home Medications Prior to Admission medications   Medication Sig Start Date End Date Taking? Authorizing Provider  acetaminophen (TYLENOL) 500 MG tablet Take 1,000 mg by mouth See admin instructions. 1038m twice daily and 10060mevery 6 hours as needed for fever, pain, headache   Yes [provider]  apixaban (ELIQUIS) 5 MG TABS tablet Take 5 mg by mouth 2 (two) times daily.   Yes [provider]  Ascorbic Acid (VITAMIN C) 1000 MG tablet Take 1,000 mg by mouth daily.   Yes [provider]  B Complex-C (SUPER B-COMPLEX + VITAMIN C PO) Take 1 tablet by mouth daily. uper b complex + c 15066mablet 04/21/21  Yes [provider]  Carboxymethylcellulose Sod PF 1 % GEL Apply 1 application to eye 3 (  three) times daily as needed (dry eyes).   Yes [provider]  Cholecalciferol (VITAMIN D-3) 125 MCG (5000 UT) TABS Take 5,000 Units by mouth daily.   Yes [provider]  clopidogrel (PLAVIX) 75 MG tablet Take 75 mg by mouth daily.   Yes [provider]  EPINEPHrine 0.3 mg/0.3 mL IJ SOAJ injection Inject 0.3 mg into the muscle once as needed (severe allergic reaction).   Yes [provider]  fenofibrate (TRICOR) 48 MG tablet Take 48 mg by mouth daily.   Yes [provider]  loperamide (IMODIUM) 2 MG capsule Take 1-2 capsules (2-4 mg total) by mouth every 6 (six) hours as needed for diarrhea or loose stools. Patient taking differently: Take 2 mg by mouth every 6 (six) hours as needed  for diarrhea or loose stools. 09/07/19  Yes Christian, Rylee, MD  loratadine (CLARITIN) 10 MG tablet Take 10 mg by mouth daily.   Yes [provider]  losartan (COZAAR) 100 MG tablet Take 100 mg by mouth daily.   Yes [provider]  memantine (NAMENDA) 5 MG tablet Take 5 mg by mouth daily.   Yes [provider]  metFORMIN (GLUCOPHAGE) 1000 MG tablet Take 1,000 mg by mouth 2 (two) times daily with a meal. 07/05/16  Yes [provider]  metoprolol tartrate (LOPRESSOR) 25 MG tablet Take 25 mg by mouth 2 (two) times daily.   Yes [provider]  naphazoline-pheniramine (VISINE-A) 0.025-0.3 % ophthalmic solution Place 1 drop into both eyes 3 (three) times daily as needed for eye irritation.   Yes [provider]  nitroGLYCERIN (NITROSTAT) 0.4 MG SL tablet Place 0.4 mg under the tongue every 5 (five) minutes as needed for chest pain.   Yes [provider]  Omega-3 Fatty Acids (ULTRA OMEGA 3 PO) Take 1-2 capsules by mouth See admin instructions. Ultra omega-3 541m-1000mg capsule 1 capsule in the morning, 2 capsules in the evening   Yes [provider]  pantoprazole (PROTONIX) 20 MG tablet Take 20 mg by mouth daily.   Yes [provider]  Probiotic Product (PROBIOTIC PO) Take 1 capsule by mouth daily. Probiotic 3 billion cell capsule   Yes [provider]  QC CALCIUM 500MG-D3 500-200 MG-UNIT TABS Take 1 tablet by mouth daily. 04/10/21  Yes [provider]  Saw Palmetto 450 MG CAPS Take 450 mg by mouth daily.   Yes [provider]  sitaGLIPtin (JANUVIA) 25 MG tablet Take 25 mg by mouth daily.   Yes [provider]  sotalol (BETAPACE) 80 MG tablet Take 80 mg by mouth 2 (two) times daily.   Yes [provider]  Zinc 23 MG LOZG Take 23 mg by mouth daily. 10/13/19  Yes [provider]  metoprolol tartrate (LOPRESSOR) 50 MG tablet Take 1 tablet (50 mg total) by mouth 2 (two) times  daily. Patient not taking: No sig reported 07/27/19   AHosie Poisson MD    Allergies    Baclofen, Bee venom, Contrast media [iodinated diagnostic agents], Iodine-131, Metrizamide, Metronidazole, Atorvastatin, and Lisinopril  Review of Systems   Review of Systems  Unable to perform ROS: Dementia   Physical Exam Updated Vital Signs BP (!) 191/91 (BP Location: Right Arm)   Pulse 67   Temp 97.8 F (36.6 C) (Oral)   Resp 16   Ht _0  (1.803 m)   Wt 89.8 kg   SpO2 100%   BMI 27.62 kg/m   Physical Exam Constitutional:  General: He is not in acute distress.    Appearance: Normal appearance. He is not ill-appearing, toxic-appearing or diaphoretic.  HENT:     Mouth/Throat:     Mouth: Mucous membranes are moist.     Pharynx: Oropharynx is clear.  Eyes:     General: No scleral icterus.    Extraocular Movements: Extraocular movements intact.     Pupils: Pupils are equal, round, and reactive to light.  Cardiovascular:     Rate and Rhythm: Normal rate and regular rhythm.     Pulses: Normal pulses.     Heart sounds: Normal heart sounds.  Pulmonary:     Effort: Pulmonary effort is normal. No respiratory distress.     Breath sounds: Normal breath sounds. No stridor. No wheezing, rhonchi or rales.  Chest:     Chest wall: No tenderness.  Abdominal:     General: Abdomen is flat. There is no distension.     Palpations: Abdomen is soft.     Tenderness: There is no abdominal tenderness. There is no guarding or rebound.  Musculoskeletal:        General: No swelling or tenderness. Normal range of motion.     Cervical back: Normal range of motion and neck supple. No rigidity.     Right lower leg: No edema.     Left lower leg: No edema.     Comments: No obvious trauma or deformities.  Normal range of motion to all extremities.  No swelling or crepitus felt.  Pelvis stable.  No tenderness to chest.  No tenderness to cervical spine, thoracic or lumbar spine.  No step-offs.  Skin:     General: Skin is warm and dry.     Capillary Refill: Capillary refill takes less than 2 seconds.     Coloration: Skin is not pale.  Neurological:     General: No focal deficit present.     Mental Status: He is alert and oriented to person, place, and time.     Comments: Alert to self..  Patient's speech is slightly slurred.  Patient has right facial droop..  Patient does have slight drift on right upper extremity compared to left.Kermit Balo strength bilaterally to upper and lower extremities.  Normal finger-to-nose on left side, patient is unable to find his nose on his right side.     Psychiatric:        Mood and Affect: Mood normal.        Behavior: Behavior normal.    ED Results / Procedures / Treatments   Labs (all labs ordered are listed, but only abnormal results are displayed) Labs Reviewed  APTT - Abnormal; Notable for the following components:      Result Value   aPTT 38 (*)    All other components within normal limits  CBC - Abnormal; Notable for the following components:   RBC 3.92 (*)    Hemoglobin 11.6 (*)    HCT 36.0 (*)    All other components within normal limits  COMPREHENSIVE METABOLIC PANEL - Abnormal; Notable for the following components:   CO2 20 (*)    Glucose, Bld 234 (*)    Creatinine, Ser 1.56 (*)    Albumin 3.3 (*)    GFR, Estimated 44 (*)    All other components within normal limits  URINALYSIS, ROUTINE W REFLEX MICROSCOPIC - Abnormal; Notable for the following components:   Glucose, UA >=500 (*)    All other components within normal limits  COMPREHENSIVE METABOLIC PANEL -  Abnormal; Notable for the following components:   Glucose, Bld 226 (*)    Creatinine, Ser 1.64 (*)    GFR, Estimated 41 (*)    All other components within normal limits  CBC - Abnormal; Notable for the following components:   RBC 4.15 (*)    Hemoglobin 12.5 (*)    HCT 37.2 (*)    All other components within normal limits  LACTIC ACID, PLASMA - Abnormal; Notable for the following  components:   Lactic Acid, Venous 2.8 (*)    All other components within normal limits  I-STAT CHEM 8, ED - Abnormal; Notable for the following components:   Creatinine, Ser 1.60 (*)    Glucose, Bld 242 (*)    Calcium, Ion 1.11 (*)    TCO2 20 (*)    Hemoglobin 11.6 (*)    HCT 34.0 (*)    All other components within normal limits  I-STAT CHEM 8, ED - Abnormal; Notable for the following components:   Creatinine, Ser 1.60 (*)    Glucose, Bld 214 (*)    Hemoglobin 11.9 (*)    HCT 35.0 (*)    All other components within normal limits  RESP PANEL BY RT-PCR (FLU A&B, COVID) ARPGX2  RESP PANEL BY RT-PCR (FLU A&B, COVID) ARPGX2  PROTIME-INR  DIFFERENTIAL  RAPID URINE DRUG SCREEN, HOSP PERFORMED  ETHANOL  PROTIME-INR  ETHANOL  URINALYSIS, ROUTINE W REFLEX MICROSCOPIC  LACTIC ACID, PLASMA  LACTIC ACID, PLASMA  SAMPLE TO BLOOD BANK    EKG None  Radiology CT HEAD WO CONTRAST  Result Date: 05/11/2021 CLINICAL DATA:  Fall.  Head trauma EXAM: CT HEAD WITHOUT CONTRAST CT CERVICAL SPINE WITHOUT CONTRAST TECHNIQUE: Multidetector CT imaging of the head and cervical spine was performed following the standard protocol without intravenous contrast. Multiplanar CT image reconstructions of the cervical spine were also generated. COMPARISON:  07/23/2019 FINDINGS: CT HEAD FINDINGS Brain: Generalized atrophy. Moderate white matter hypodensity bilaterally, unchanged. Mild ventricular enlargement due to atrophy, unchanged. Negative for acute infarct, hemorrhage, mass Vascular: Negative for hyperdense vessel Skull: Negative Sinuses/Orbits: Mild mucosal edema paranasal sinuses. Bilateral cataract extraction. Other: None CT CERVICAL SPINE FINDINGS Alignment: Mild anterolisthesis C4-5 Skull base and vertebrae: Negative for fracture Soft tissues and spinal canal: Negative Disc levels: Multilevel disc and facet degeneration. Foraminal stenosis bilaterally most prominent C4-5 and C5-6 due to spurring. Upper chest:  Lung apices clear bilaterally. Other: None IMPRESSION: 1. No acute intracranial abnormality. Atrophy with chronic microvascular ischemic change in the white matter 2. Negative for cervical spine fracture.  Multilevel spondylosis. Electronically Signed   By: Franchot Gallo M.D.   On: 05/11/2021 15:07   CT CERVICAL SPINE WO CONTRAST  Result Date: 05/11/2021 CLINICAL DATA:  Fall.  Head trauma EXAM: CT HEAD WITHOUT CONTRAST CT CERVICAL SPINE WITHOUT CONTRAST TECHNIQUE: Multidetector CT imaging of the head and cervical spine was performed following the standard protocol without intravenous contrast. Multiplanar CT image reconstructions of the cervical spine were also generated. COMPARISON:  07/23/2019 FINDINGS: CT HEAD FINDINGS Brain: Generalized atrophy. Moderate white matter hypodensity bilaterally, unchanged. Mild ventricular enlargement due to atrophy, unchanged. Negative for acute infarct, hemorrhage, mass Vascular: Negative for hyperdense vessel Skull: Negative Sinuses/Orbits: Mild mucosal edema paranasal sinuses. Bilateral cataract extraction. Other: None CT CERVICAL SPINE FINDINGS Alignment: Mild anterolisthesis C4-5 Skull base and vertebrae: Negative for fracture Soft tissues and spinal canal: Negative Disc levels: Multilevel disc and facet degeneration. Foraminal stenosis bilaterally most prominent C4-5 and C5-6 due to spurring. Upper chest: Lung  apices clear bilaterally. Other: None IMPRESSION: 1. No acute intracranial abnormality. Atrophy with chronic microvascular ischemic change in the white matter 2. Negative for cervical spine fracture.  Multilevel spondylosis. Electronically Signed   By: Franchot Gallo M.D.   On: 05/11/2021 15:07   MR Brain Wo Contrast (neuro protocol)  Result Date: 05/11/2021 CLINICAL DATA:  Neuro deficit, acute stroke suspected. EXAM: MRI HEAD WITHOUT CONTRAST TECHNIQUE: Multiplanar, multiecho pulse sequences of the brain and surrounding structures were obtained without  intravenous contrast. COMPARISON:  Same day CT head.  MRI 07/23/2019. FINDINGS: Brain: Acute infarct in the posterior limb of the left internal capsule. Trace edema without mass effect. No acute hemorrhage. No mass lesion or abnormal mass effect. Neuro extra-axial fluid collection. Moderate atrophy with ex vacuo ventricular dilation, similar to priors. Moderate patchy T2/FLAIR hyperintensities within the white matter, most likely related to chronic microvascular ischemic disease. Vascular: Major arterial flow voids maintained at the skull base. Skull and upper cervical spine: Normal marrow signal. Sinuses/Orbits: Moderate ethmoid air cell mucosal thickening with milder mucosal thickening of the left maxillary sinus, sphenoid and frontal sinuses. No air-fluid levels. Unremarkable orbits. Other: No sizable mastoid effusions. IMPRESSION: 1. Acute infarct in the posterior limb of the left internal capsule.Trace edema without mass effect. 2. Moderate atrophy and chronic microvascular ischemic disease. 3. Paranasal sinus disease, as described above. Electronically Signed   By: Margaretha Sheffield MD   On: 05/11/2021 18:03   DG Pelvis Portable  Result Date: 05/11/2021 CLINICAL DATA:  Recent fall, initial encounter EXAM: PORTABLE PELVIS 1-2 VIEWS COMPARISON:  None. FINDINGS: Pelvic ring is intact. Penile implant is noted. Mild degenerative changes of the hip joints are noted as well as the lower lumbar spine. No acute fracture or dislocation is seen. IMPRESSION: Degenerative change without acute bony abnormality. Electronically Signed   By: Inez Catalina M.D.   On: 05/11/2021 14:45   DG Chest Port 1 View  Result Date: 05/11/2021 CLINICAL DATA:  Recent fall while on blood thinners EXAM: PORTABLE CHEST 1 VIEW COMPARISON:  05/10/2021 FINDINGS: Cardiac shadow is stable. Lungs are well aerated bilaterally. No focal infiltrate or sizable effusion is seen. Stable scarring in the left base is noted. No bony abnormality is  noted. IMPRESSION: Stable left basilar scarring.  No acute abnormality seen. Electronically Signed   By: Inez Catalina M.D.   On: 05/11/2021 14:45    Procedures .Critical Care  Date/Time: 05/11/2021 4:21 PM Performed by: Alfredia Client, PA-C Authorized by: Alfredia Client, PA-C   Critical care provider statement:    Critical care time (minutes):  45   Critical care was time spent personally by me on the following activities:  Discussions with consultants, evaluation of patient's response to treatment, examination of patient, ordering and performing treatments and interventions, ordering and review of laboratory studies, ordering and review of radiographic studies, pulse oximetry, re-evaluation of patient's condition, obtaining history from patient or surrogate and review of old charts   Medications Ordered in ED Medications  insulin aspart (novoLOG) injection 0-9 Units (has no administration in time range)  sodium chloride 0.9 % bolus 500 mL (500 mLs Intravenous New Bag/Given 05/11/21 1909)    ED Course  I have reviewed the triage vital signs and the nursing notes.  Pertinent labs & imaging results that were available during my care of the patient were reviewed by me and considered in my medical decision making (see chart for details).    MDM Rules/Calculators/A&P  Patient presents to the emerged department today for level 2 fall after falling 3 times on his head with blood thinners.  Patient with abnormal neuro exam, concern for stroke at this time.  Patient is out of stroke window since symptoms started over 24 hours ago.  No acute or obvious trauma seen.  CT without any acute findings, Dr. Rory Percy recommends MRI at this time.  Plain films without any acute findings.  Pt is a full code.   MRI does show acute infarct in left internal capsule. Dr. Rory Percy, neurology will consult. He wants to continue Eliquis.   Pt admitted to Dr. Harlin Heys.   I discussed this case  with my attending physician who cosigned this note including patient's presenting symptoms, physical exam, and planned diagnostics and interventions. Attending physician stated agreement with plan or made changes to plan which were implemented.   The patient appears reasonably stabilized for admission considering the current resources, flow, and capabilities available in the ED at this time, and I doubt any other [facility] requiring further screening and/or treatment in the ED prior to admission.   Final Clinical Impression(s) / ED Diagnoses Final diagnoses:  Trauma  Ischemic stroke [facility])    Rx / DC Orders ED Discharge Orders     None        Alfredia Client, PA-C 05/11/21 1917    Lorelle Gibbs, DO 05/18/21 1901

## 2021-05-11 NOTE — ED Triage Notes (Signed)
Pt here from [place] with c/o fall times 3 since pt left [place] ED yesterday , pt has had facial droop and slurred speech since sat , pt has hx of dementia

## 2021-05-11 NOTE — ED Notes (Signed)
Patient transported to CT 

## 2021-05-12 ENCOUNTER — Inpatient Hospital Stay (HOSPITAL_COMMUNITY): Payer: Medicare Other

## 2021-05-12 ENCOUNTER — Encounter (HOSPITAL_COMMUNITY): Payer: Self-pay | Admitting: Internal Medicine

## 2021-05-12 DIAGNOSIS — E663 Overweight: Secondary | ICD-10-CM | POA: Diagnosis present

## 2021-05-12 DIAGNOSIS — I6389 Other cerebral infarction: Secondary | ICD-10-CM

## 2021-05-12 DIAGNOSIS — R4701 Aphasia: Secondary | ICD-10-CM

## 2021-05-12 DIAGNOSIS — I639 Cerebral infarction, unspecified: Secondary | ICD-10-CM

## 2021-05-12 DIAGNOSIS — I63312 Cerebral infarction due to thrombosis of left middle cerebral artery: Secondary | ICD-10-CM

## 2021-05-12 LAB — COMPREHENSIVE METABOLIC PANEL
ALT: 14 U/L (ref 0–44)
AST: 23 U/L (ref 15–41)
Albumin: 3.4 g/dL — ABNORMAL LOW (ref 3.5–5.0)
Alkaline Phosphatase: 52 U/L (ref 38–126)
Anion gap: 14 (ref 5–15)
BUN: 10 mg/dL (ref 8–23)
CO2: 23 mmol/L (ref 22–32)
Calcium: 9.1 mg/dL (ref 8.9–10.3)
Chloride: 100 mmol/L (ref 98–111)
Creatinine, Ser: 1.38 mg/dL — ABNORMAL HIGH (ref 0.61–1.24)
GFR, Estimated: 50 mL/min — ABNORMAL LOW (ref >60)
Glucose, Bld: 149 mg/dL — ABNORMAL HIGH (ref 70–99)
Potassium: 3.6 mmol/L (ref 3.5–5.1)
Sodium: 137 mmol/L (ref 135–145)
Total Bilirubin: 0.7 mg/dL (ref 0.3–1.2)
Total Protein: 6.9 g/dL (ref 6.5–8.1)

## 2021-05-12 LAB — ECHOCARDIOGRAM COMPLETE
Height: 70 in
S' Lateral: 2.3 cm
Weight: 3280.44 oz

## 2021-05-12 LAB — LIPID PANEL
Cholesterol: 207 mg/dL — ABNORMAL HIGH (ref 0–200)
HDL: 30 mg/dL — ABNORMAL LOW (ref >40)
LDL Cholesterol: UNDETERMINED mg/dL (ref 0–99)
Total CHOL/HDL Ratio: 6.9 RATIO
Triglycerides: 676 mg/dL — ABNORMAL HIGH (ref <150)
VLDL: UNDETERMINED mg/dL (ref 0–40)

## 2021-05-12 LAB — CBC
HCT: 37.1 % — ABNORMAL LOW (ref 39.0–52.0)
Hemoglobin: 12.5 g/dL — ABNORMAL LOW (ref 13.0–17.0)
MCH: 29.3 pg (ref 26.0–34.0)
MCHC: 33.7 g/dL (ref 30.0–36.0)
MCV: 86.9 fL (ref 80.0–100.0)
Platelets: 197 10*3/uL (ref 150–400)
RBC: 4.27 MIL/uL (ref 4.22–5.81)
RDW: 14.2 % (ref 11.5–15.5)
WBC: 7.5 10*3/uL (ref 4.0–10.5)
nRBC: 0 % (ref 0.0–0.2)

## 2021-05-12 LAB — GLUCOSE, CAPILLARY
Glucose-Capillary: 165 mg/dL — ABNORMAL HIGH (ref 70–99)
Glucose-Capillary: 179 mg/dL — ABNORMAL HIGH (ref 70–99)
Glucose-Capillary: 180 mg/dL — ABNORMAL HIGH (ref 70–99)
Glucose-Capillary: 209 mg/dL — ABNORMAL HIGH (ref 70–99)
Glucose-Capillary: 218 mg/dL — ABNORMAL HIGH (ref 70–99)
Glucose-Capillary: 228 mg/dL — ABNORMAL HIGH (ref 70–99)

## 2021-05-12 LAB — LACTIC ACID, PLASMA: Lactic Acid, Venous: 1.3 mmol/L (ref 0.5–1.9)

## 2021-05-12 LAB — MRSA NEXT GEN BY PCR, NASAL: MRSA by PCR Next Gen: NOT DETECTED

## 2021-05-12 IMAGING — MR MR MRA HEAD W/O CM
1 series · 19 of 48 positions shown · non-contrast
Comparison: No pertinent prior exam.

CLINICAL DATA: Neuro deficit, acute, stroke suspected.

EXAM:
MRA HEAD WITHOUT CONTRAST
TECHNIQUE: Angiographic images of the Circle of Willis were acquired using MRA
technique without intravenous contrast.

[Series 2: ax (id) · axial · 1.0mm · 0.43mm/px · z∈[-18,+64]mm · 19 of 176 slices shown]
[im 1/176]
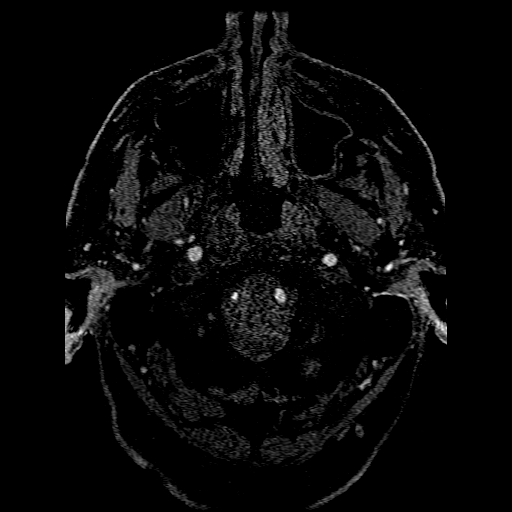
[im 4/176]
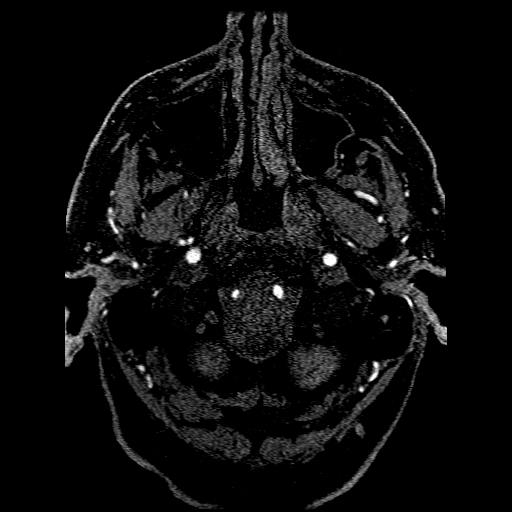
[im 8/176]
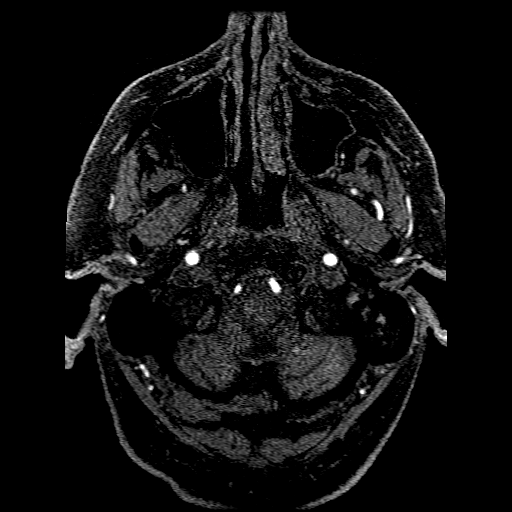
[im 12/176]
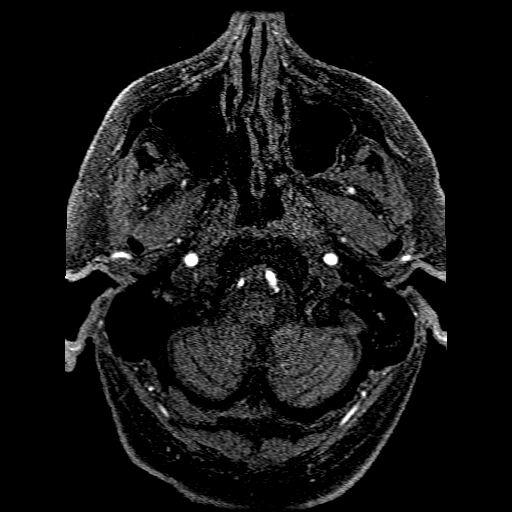
[im 15/176]
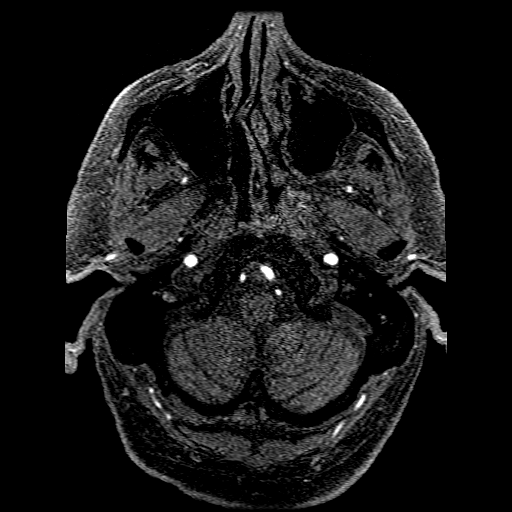
[im 19/176]
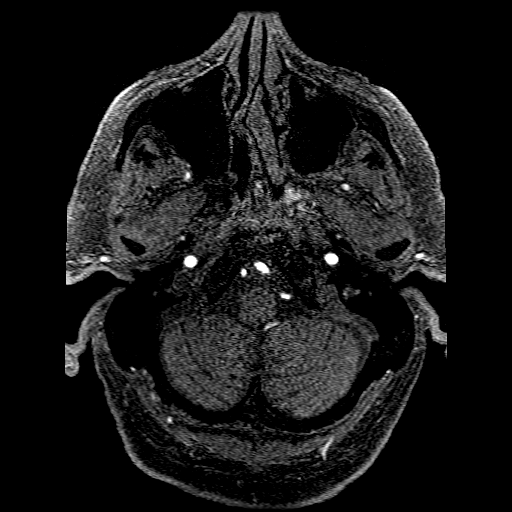
[im 23/176]
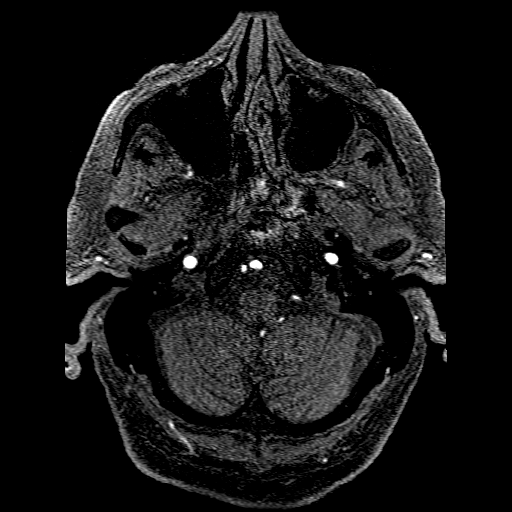
[im 27/176]
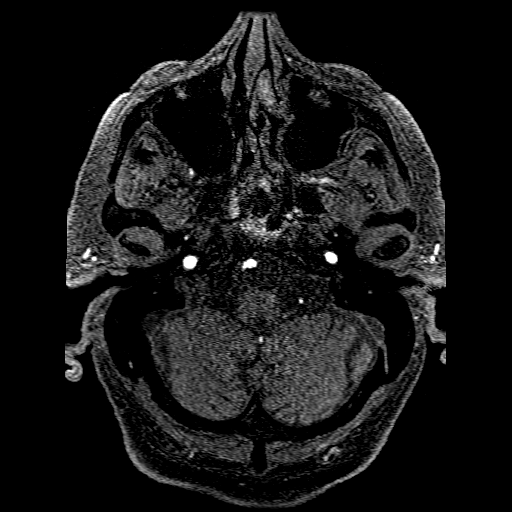
[im 30/176]
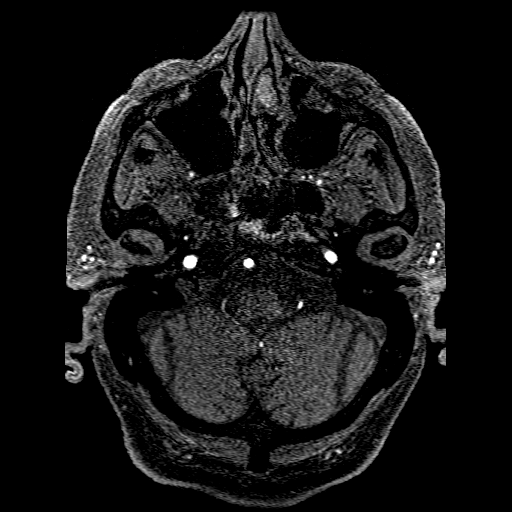
[im 34/176]
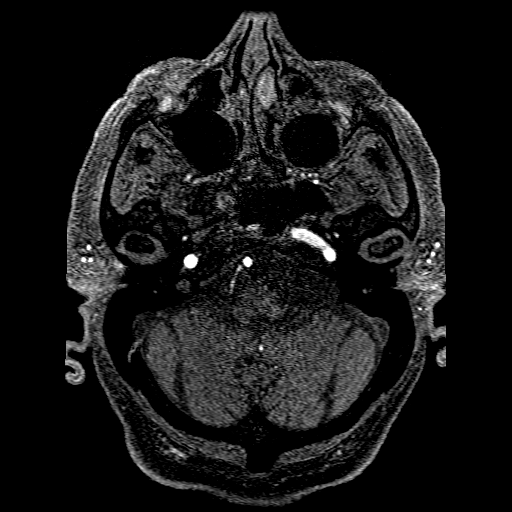
[im 38/176]
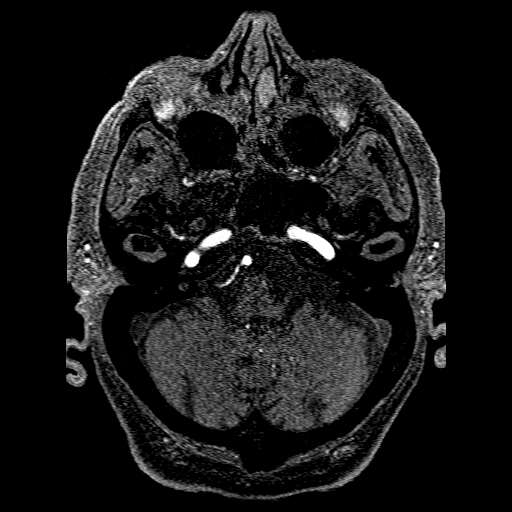
[im 56/176]
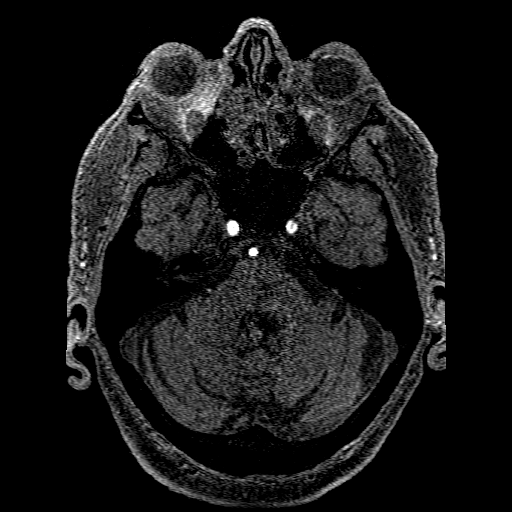
[im 79/176]
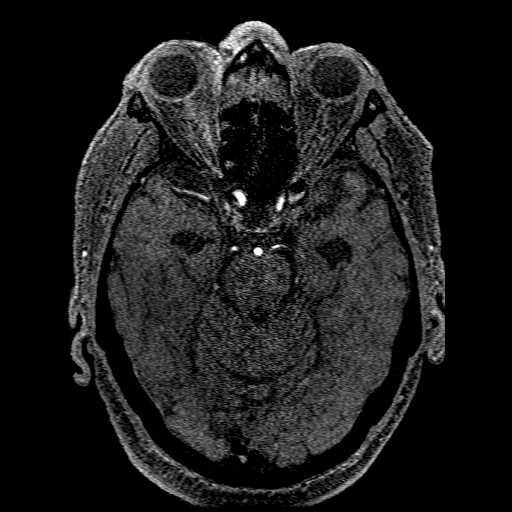
[im 90/176]
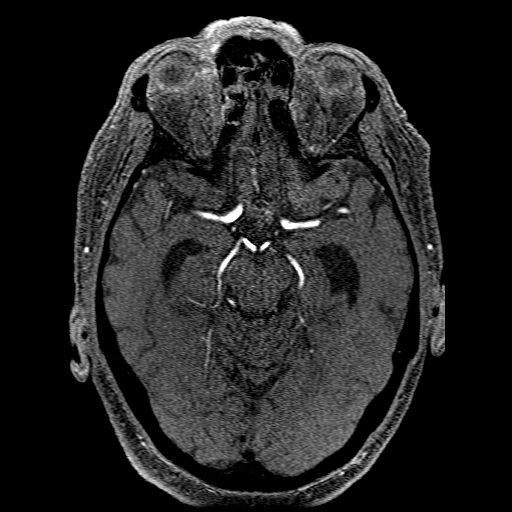
[im 101/176]
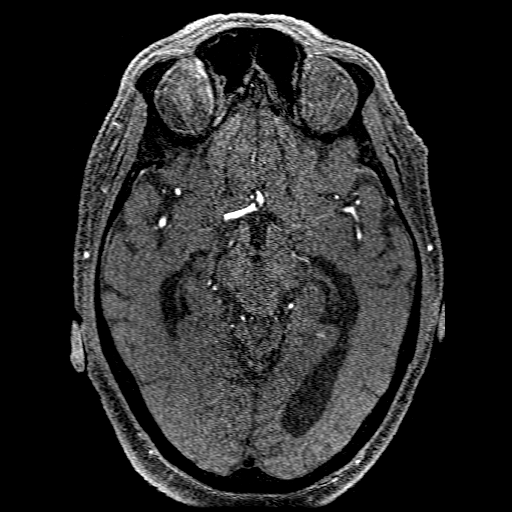
[im 123/176]
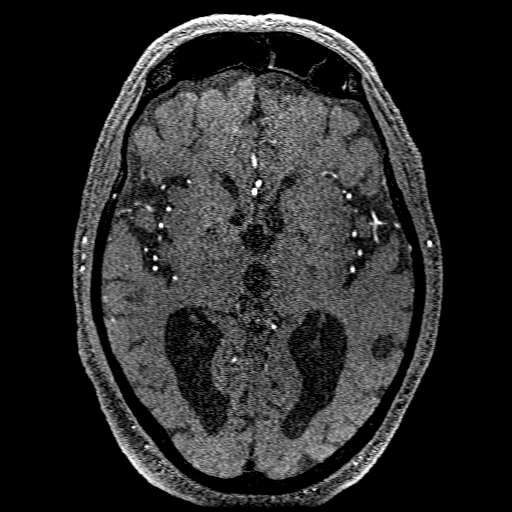
[im 146/176]
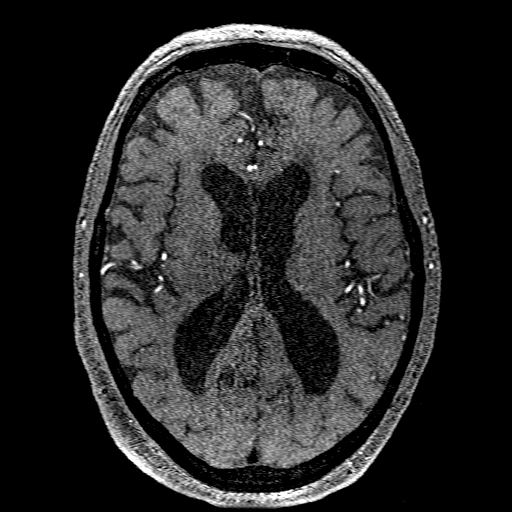
[im 149/176]
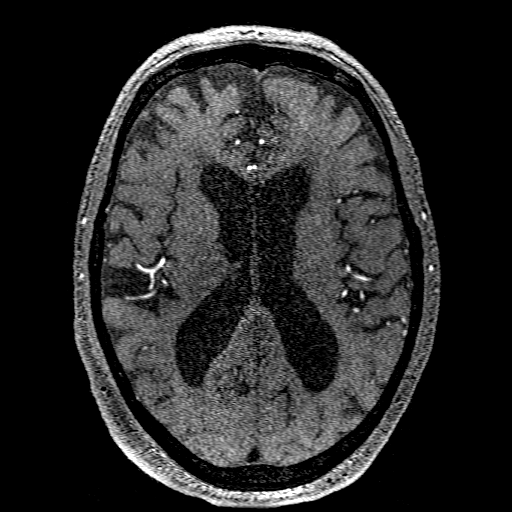
[im 168/176]
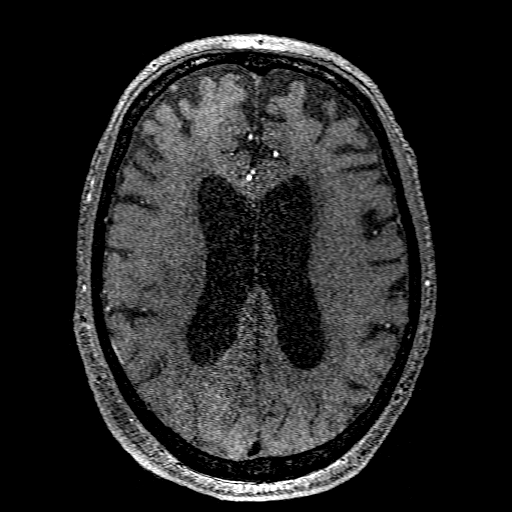

[19 of 48 positions shown; findings below may reference images not displayed]

FINDINGS: Anterior circulation: The visualized portions of the distal cervical
and intracranial internal carotid arteries are widely patent with
normal flow related enhancement. The bilateral anterior cerebral
arteries and middle cerebral arteries are widely patent with
antegrade flow without high-grade flow-limiting stenosis or proximal
branch occlusion. No intracranial aneurysm within the anterior
circulation.

Posterior circulation: The vertebral arteries are widely patent with
antegrade flow. Vertebrobasilar junction and basilar artery are
widely patent with antegrade flow without evidence of basilar
stenosis or aneurysm. Mild stenosis of the proximal left P2/PCA
segment is seen. The right posterior cerebral artery is normal. No
intracranial aneurysm within the posterior circulation.

Anatomic variants: Hypoplastic left A1/ACA segment.

Other: None.
IMPRESSION: Mild narrowing of the proximal left P2/PCA segment. Otherwise,
unremarkable MR angiogram of the head.

## 2021-05-12 MED ORDER — LORAZEPAM 2 MG/ML IJ SOLN
0.5000 mg | Freq: Once | INTRAMUSCULAR | Status: AC
Start: 2021-05-12 — End: 2021-05-12
  Administered 2021-05-12: 0.5 mg via INTRAVENOUS
  Filled 2021-05-12: qty 1

## 2021-05-12 NOTE — Progress Notes (Addendum)
STROKE TEAM PROGRESS NOTE    Interval History   Overnight he was restless and did not know where he was per nursing.   Daughter at bedside and was educated about stroke, stroke etiology and future stroke prevention.    Pertinent Lab Work and Imaging    05/11/21 CT Head WO IV Contrast 1. No acute intracranial abnormality. Atrophy with chronic microvascular ischemic change in the white matter 2. Negative for cervical spine fracture.  Multilevel spondylosis.  05/12/21 MR Angio Head WO Contrast  Mild narrowing of the proximal left P2/PCA segment. Otherwise, unremarkable MR angiogram of the head.  05/11/21 MRI Brain WO IV Contrast 1. Acute infarct in the posterior limb of the left internal capsule.Trace edema without mass effect. 2. Moderate atrophy and chronic microvascular ischemic disease. 3. Paranasal sinus disease, as described above.  05/12/21 Echocardiogram Complete  Results pending   Physical Examination   Constitutional: Calm, appropriate for condition Cardiovascular: Normal RR Respiratory: No increased WOB   Mental status: Oriented to self, year not oriented to situation or location, follows commands to show two fingers gives thumbs up  Speech: Fluent, repetition and naming intact  Cranial nerves: EOMI, VFF, Right Facial Droop, Tongue midline  Motor: Normal bulk and tone. Right pronator drift.    Dlt Bic Tri FgS Grp HF  KnF KnE PIF DoF  R 5 5 5 5 5 5 5 5 5 5   L 5 5 5 5 5 5 5 5 5 5   Sensory: Intact to light tough throughout  Coordination: Intact FNF  Gait: Deferred   Assessment and Plan   [name] is a 86 y.o. male w/pmh of atrial fibrillation on Eliquis, coronary artery disease status post PCI, CKD stage III, obstructive sleep apnea, diabetes, hypertension, hyperlipidemia, dementia, peripheral neuropathy who presents with falls, facial droop, slurred speech. 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   #Left Internal Capsule Stroke  Patient presented  with the symptoms described above. At this time, stroke work up is essentially complete. MRI Brain showed a left internal capsule stroke. MRA Head w/moderate narrowing of the proximal left P2 PCA segment. CUS vessel imaging pending. Echo results pending. Suspect that internal capsule stroke is in the setting of small vessel disease. His regimen is optimized from a stroke standpoint- he is on Eliquis for atrial fibrillation which has been resumed this admission and is also on Plavix given CAD. Will not change regimen at this time. Stroke labs ordered today as a part of work up. Of note, he has had myalgias with statin therapy in the past thus was taking Fenofibrate and Omega 3 Fatty Acids.  -Continue Eliquis 5 mg BID + Plavix 75 mg QD for stroke prevention -Continue Fenofibrate for stroke prevention given he can not tolerate statins  -Will place ambulatory referral to neurology for stroke follow up now for discharge purposes  -PT/OT/ST evals   #Hypertension He has a history of HTN and takes Losartan 100 mg QD and Metoprolol 25 BID at home. Currently blood pressure is trending 140 to 190 range. Recommend permissive HTN up to 220 for the first 24 to 48 hours post stroke and from there gradually reduce blood pressure, avoiding any acute drops. Long term blood pressure goal is < 130/80. - Primary team can began reducing blood pressure on 6/29 with an initial acute SBP goal < 180, making sure to avoid abrupt drops in pressure  #Hyperlipidemia From a stroke prevention stand point, the LDL goal is < 70.  Waiting on lipid panel results.   #Stroke Diabetes Screening  Hemoglobin A1C this admission pending   Hospital day # 1  Ruta Hinds, NP  [facility] Nurse Practitioner Patient seen and discussed with attending physician Dr. Erlinda Hong   ATTENDING NOTE: I reviewed above note and agree with the assessment and plan. Pt was seen and examined.   86 year old male with history of A. fib on Eliquis,  CAD status post PCI, CKD, OSA, diabetes, hypertension, hyperlipidemia, dementia admitted for several falls, slurred speech, difficulty standing, and right facial droop. CT no acute abnormality, MRI showed left BG infarct, MRA mild left P2 stenosis but otherwise unremarkable.  Carotid Doppler unremarkable ICAs.  EF 60 to 65%.  LDL and A1c pending.  UDS negative.  Creatinine 1.6.  On exam, patient daughter at bedside, patient laying in bed, awake alert, orientated to place and people, however not orientated to age or time.  No aphasia, but mild to moderate dysarthria, follows simple commands.  No gaze palsy, visual field full.  Right facial droop, tongue protrusion midline.  Right upper and lower extremity 4/5, left upper and lower extremity 5/5.  Sensation subjectively symmetrical.  Finger-to-nose bilaterally grossly intact, slow wound right side.  Gait not tested.  Etiology for patient stroke likely small vessel disease given the location.  Patient does have A. fib and on Eliquis.  He was also on Plavix.  His antithrombotic regimen has been maximized, continue on discharge.  Patient on home Tricor for HLD, LDL pending at this time.  Continue Tricor for now, may add statin if LDL outside of goal.  Stroke risk factor modification.  PT/OT recommend SNF.  For detailed assessment and plan, please refer to above as I have made changes wherever appropriate.   Rosalin Hawking, MD PhD Stroke Neurology 05/12/2021 3:10 PM     To contact Stroke Continuity provider, please refer to [email or web address] After hours, contact General Neurology

## 2021-05-12 NOTE — Progress Notes (Signed)
  Echocardiogram 2D Echocardiogram has been performed.  Duane [name] 05/12/2021, 12:17 PM

## 2021-05-12 NOTE — Progress Notes (Signed)
PROGRESS NOTE  Duane [name] ESP:[id number] DOB: 25-Oct-1935 DOA: 05/11/2021 PCP: [name], MD  HPI/Recap of past 73 hours: 86 year old male with past medical history of diabetes mellitus type 2, stage III chronic kidney disease, obstructive sleep apnea, hypertension and atrial febrile Eliquis presented to the emergency room on 6/27 with slurred speech, right facial droop and pronator drift.  Patient had reportedly been taking medications including Eliquis and Plavix.  CT scan of head unremarkable, but MRI noted acute infarct in the posterior limb of the left internal capsule and patient admitted for stroke work-up to the hospitalist service.  MRA of the head noted mild narrowing of left P2/PCA segment.  Carotid Dopplers noted less than 50% stenosis of bilateral ICA and left ECA greater than 50% stenosis.  Echocardiogram done unremarkable.  Seen by PT and OT who are recommending skilled nursing.  Patient seen this morning.  Doing okay, complains of being a little thirsty.  Appropriate and follows all commands.  Assessment/Plan: Principal Problem: Acute CVA ([place]): Passed swallow evaluation.  Awaiting for A1c and lipid panel.  Needs skilled nursing.  Followed by neurology.  Looks to be small vessel disease.  Already on Plavix and Eliquis.  For now, permissive hypertension. Active Problems:   Essential hypertension: Allowing for permissive hypertension.  Monitor closely.    PAF (paroxysmal atrial fibrillation) (HCC) acute CVA does not look to be related to atrial fibrillation.  Rate controlled.  Continued on Eliquis.    Diabetes mellitus type 2, controlled ([place]): Continue A1c.    CKD (chronic kidney disease), stage III (HCC):Looks to be back at baseline.   Hyperlipidemia: Continue fenofibrate.  Waiting for lipid panel.   Overweight (BMI 25.0-29.9): Patient meets criteria BMI greater than 25.   Code Status: Full code  Family Communication: Left message for family   Disposition  Plan: Needs SNF    Consultants: Neuro   Procedures: Echocardiogram: preserved ejection fraction,   Carotid dopplers: no sig intracranial stenosis  Antimicrobials: None   DVT prophylaxis:  Eliquis  Level of care: Telemetry Medical   Objective: Vitals:   05/12/21 0424 05/12/21 0755  BP: (!) 165/100 (!) 197/95  Pulse: 81 89  Resp: 16 18  Temp: 97.9 F (36.6 C) 98.3 F (36.8 C)  SpO2: 99% 98%    Intake/Output Summary (Last 24 hours) at 05/12/2021 1549 Last data filed at 05/12/2021 0420 Gross per 24 hour  Intake 500 ml  Output 700 ml  Net -200 ml   Filed Weights   05/11/21 1400 05/11/21 2049  Weight: 89.8 kg 93 kg   Body mass index is 29.42 kg/m.  Exam:  General:  Alert and oriented x2-3, NAD HEENT: Normocephalic, atraumatic, mucous membranes dry  Cardiovascular: Ireegular rhythm, rt controlled  Respiratory: Clear to auscultation bilaterally  Abdomen: soft, NT, ND, +BS  Musculoskeletal: No clubbing or cyanosis, trace pitting edema Skin: Dry skin, no skin breaks, tears or lesions Psychiatry: Appropriate, no evidence of psychoses Neurology: Downgoing toes, mild right facial droop although otherwise cranial nerves are intact.  Flexion, extension, upper and lower 5/5.   Data Reviewed: CBC: Recent Labs  Lab 05/11/21 1342 05/11/21 1356 05/11/21 1415 05/11/21 1453 05/12/21 1019  WBC 9.1  --  8.4  --  7.5  NEUTROABS 5.0  --   --   --   --   HGB 11.6* 11.6* 12.5* 11.9* 12.5*  HCT 36.0* 34.0* 37.2* 35.0* 37.1*  MCV 91.8  --  89.6  --  86.9  PLT 206  --  192  --  371   Basic Metabolic Panel: Recent Labs  Lab 05/11/21 1342 05/11/21 1356 05/11/21 1415 05/11/21 1453 05/12/21 1019  NA 135 136 137 138 137  K 4.2 4.1 4.5 4.1 3.6  CL 103 105 103 104 100  CO2 20*  --  22  --  23  GLUCOSE 234* 242* 226* 214* 149*  BUN 15 15 15 14 10   CREATININE 1.56* 1.60* 1.64* 1.60* 1.38*  CALCIUM 8.9  --  9.3  --  9.1   GFR: Estimated Creatinine Clearance: 45.7  mL/min (A) (by C-G formula based on SCr of 1.38 mg/dL (H)). Liver Function Tests: Recent Labs  Lab 05/11/21 1342 05/11/21 1415 05/12/21 1019  AST 25 30 23   ALT 16 15 14   ALKPHOS 49 48 52  BILITOT 0.6 0.7 0.7  PROT 6.5 6.8 6.9  ALBUMIN 3.3* 3.5 3.4*   No results for input(s): LIPASE, AMYLASE in the last 168 hours. No results for input(s): AMMONIA in the last 168 hours. Coagulation Profile: Recent Labs  Lab 05/11/21 1342 05/11/21 1415  INR 1.2 1.1   Cardiac Enzymes: No results for input(s): CKTOTAL, CKMB, CKMBINDEX, TROPONINI in the last 168 hours. BNP (last 3 results) No results for input(s): PROBNP in the last 8760 hours. HbA1C: No results for input(s): HGBA1C in the last 72 hours. CBG: Recent Labs  Lab 05/12/21 0008 05/12/21 0425 05/12/21 0811 05/12/21 1120  GLUCAP 209* 179* 180* 218*   Lipid Profile: No results for input(s): CHOL, HDL, LDLCALC, TRIG, CHOLHDL, LDLDIRECT in the last 72 hours. Thyroid Function Tests: No results for input(s): TSH, T4TOTAL, FREET4, T3FREE, THYROIDAB in the last 72 hours. Anemia Panel: No results for input(s): VITAMINB12, FOLATE, FERRITIN, TIBC, IRON, RETICCTPCT in the last 72 hours. Urine analysis:    Component Value Date/Time   COLORURINE YELLOW 05/11/2021 1610   APPEARANCEUR CLEAR 05/11/2021 1610   LABSPEC 1.011 05/11/2021 1610   PHURINE 6.0 05/11/2021 1610   GLUCOSEU >=500 (A) 05/11/2021 1610   HGBUR NEGATIVE 05/11/2021 1610   BILIRUBINUR NEGATIVE 05/11/2021 1610   KETONESUR NEGATIVE 05/11/2021 1610   PROTEINUR NEGATIVE 05/11/2021 1610   NITRITE NEGATIVE 05/11/2021 1610   LEUKOCYTESUR NEGATIVE 05/11/2021 1610   Sepsis Labs: @LABRCNTIP (procalcitonin:4,lacticidven:4)  ) Recent Results (from the past 240 hour(s))  Resp Panel by RT-PCR (Flu A&B, Covid) Nasopharyngeal Swab     Status: None   Collection Time: 05/11/21  2:50 PM   Specimen: Nasopharyngeal Swab; Nasopharyngeal(NP) swabs in vial transport medium  Result Value  Ref Range Status   SARS Coronavirus 2 by RT PCR NEGATIVE NEGATIVE Final    Comment: (NOTE) SARS-CoV-2 target nucleic acids are NOT DETECTED.  The SARS-CoV-2 RNA is generally detectable in upper respiratory specimens during the acute phase of infection. The lowest concentration of SARS-CoV-2 viral copies this assay can detect is 138 copies/mL. A negative result does not preclude SARS-Cov-2 infection and should not be used as the sole basis for treatment or other patient management decisions. A negative result may occur with  improper specimen collection/handling, submission of specimen other than nasopharyngeal swab, presence of viral mutation(s) within the areas targeted by this assay, and inadequate number of viral copies(<138 copies/mL). A negative result must be combined with clinical observations, patient history, and epidemiological information. The expected result is Negative.  Fact Sheet for Patients:  EntrepreneurPulse.com.au  Fact Sheet for Healthcare Providers:  IncredibleEmployment.be  This test is no t yet approved or cleared by the [place] and  has been authorized  for detection and/or diagnosis of SARS-CoV-2 by FDA under an Emergency Use Authorization (EUA). This EUA will remain  in effect (meaning this test can be used) for the duration of the COVID-19 declaration under Section 564(b)(1) of the Act, 21 U.S.C.section 360bbb-3(b)(1), unless the authorization is terminated  or revoked sooner.       Influenza A by PCR NEGATIVE NEGATIVE Final   Influenza B by PCR NEGATIVE NEGATIVE Final    Comment: (NOTE) The Xpert Xpress SARS-CoV-2/FLU/RSV plus assay is intended as an aid in the diagnosis of influenza from Nasopharyngeal swab specimens and should not be used as a sole basis for treatment. Nasal washings and aspirates are unacceptable for Xpert Xpress SARS-CoV-2/FLU/RSV testing.  Fact Sheet for  Patients: EntrepreneurPulse.com.au  Fact Sheet for Healthcare Providers: IncredibleEmployment.be  This test is not yet approved or cleared by the [place] FDA and has been authorized for detection and/or diagnosis of SARS-CoV-2 by FDA under an Emergency Use Authorization (EUA). This EUA will remain in effect (meaning this test can be used) for the duration of the COVID-19 declaration under Section 564(b)(1) of the Act, 21 U.S.C. section 360bbb-3(b)(1), unless the authorization is terminated or revoked.  Performed at [facility], [place] [address] [place], [place] [address]   MRSA Next Gen by PCR, Nasal     Status: None   Collection Time: 05/12/21  2:58 AM   Specimen: Nasal Mucosa; Nasal Swab  Result Value Ref Range Status   MRSA by PCR Next Gen NOT DETECTED NOT DETECTED Final    Comment: (NOTE) The GeneXpert MRSA Assay (FDA approved for NASAL specimens only), is one component of a comprehensive MRSA colonization surveillance program. It is not intended to diagnose MRSA infection nor to guide or monitor treatment for MRSA infections. Test performance is not FDA approved in patients less than 49 years old. Performed at [facility], [place] [address] [place], [place] [address]       Studies: MR ANGIO HEAD WO CONTRAST  Result Date: 05/12/2021 CLINICAL DATA:  Neuro deficit, acute, stroke suspected. EXAM: MRA HEAD WITHOUT CONTRAST TECHNIQUE: Angiographic images of the Circle of Willis were acquired using MRA technique without intravenous contrast. COMPARISON:  No pertinent prior exam. FINDINGS: Anterior circulation: The visualized portions of the distal cervical and intracranial internal carotid arteries are widely patent with normal flow related enhancement. The bilateral anterior cerebral arteries and middle cerebral arteries are widely patent with antegrade flow without high-grade flow-limiting stenosis or proximal branch occlusion.  No intracranial aneurysm within the anterior circulation. Posterior circulation: The vertebral arteries are widely patent with antegrade flow. Vertebrobasilar junction and basilar artery are widely patent with antegrade flow without evidence of basilar stenosis or aneurysm. Mild stenosis of the proximal left P2/PCA segment is seen. The right posterior cerebral artery is normal. No intracranial aneurysm within the posterior circulation. Anatomic variants: Hypoplastic left A1/ACA segment. Other: None. IMPRESSION: Mild narrowing of the proximal left P2/PCA segment. Otherwise, unremarkable MR angiogram of the head. Electronically Signed   By: Pedro Earls M.D.   On: 05/12/2021 10:05   MR Brain Wo Contrast (neuro protocol)  Result Date: 05/11/2021 CLINICAL DATA:  Neuro deficit, acute stroke suspected. EXAM: MRI HEAD WITHOUT CONTRAST TECHNIQUE: Multiplanar, multiecho pulse sequences of the brain and surrounding structures were obtained without intravenous contrast. COMPARISON:  Same day CT head.  MRI 07/23/2019. FINDINGS: Brain: Acute infarct in the posterior limb of the left internal capsule. Trace edema without mass effect. No acute  hemorrhage. No mass lesion or abnormal mass effect. Neuro extra-axial fluid collection. Moderate atrophy with ex vacuo ventricular dilation, similar to priors. Moderate patchy T2/FLAIR hyperintensities within the white matter, most likely related to chronic microvascular ischemic disease. Vascular: Major arterial flow voids maintained at the skull base. Skull and upper cervical spine: Normal marrow signal. Sinuses/Orbits: Moderate ethmoid air cell mucosal thickening with milder mucosal thickening of the left maxillary sinus, sphenoid and frontal sinuses. No air-fluid levels. Unremarkable orbits. Other: No sizable mastoid effusions. IMPRESSION: 1. Acute infarct in the posterior limb of the left internal capsule.Trace edema without mass effect. 2. Moderate atrophy and  chronic microvascular ischemic disease. 3. Paranasal sinus disease, as described above. Electronically Signed   By: Margaretha Sheffield MD   On: 05/11/2021 18:03   ECHOCARDIOGRAM COMPLETE  Result Date: 05/12/2021    ECHOCARDIOGRAM REPORT   Patient Name:   Duane KEAN Date of Exam: 05/12/2021 Medical Rec #:  [id number]       Height:       70.0 in Accession #:    [id number]      Weight:       205.0 lb Date of Birth:  September 01, 1935       BSA:          2.109 m Patient Age:    34 years        BP:           197/95 mmHg Patient Gender: M               HR:           86 bpm. Exam Location:  Inpatient Procedure: 2D Echo Indications:    stroke  History:        Patient has no prior history of Echocardiogram examinations.                 CAD, chronic kidney disease, Arrythmias:Atrial Fibrillation;                 Risk Factors:Diabetes, Dyslipidemia, Hypertension and Sleep                 Apnea.  Sonographer:    Johny Chess Referring Phys: [place]  1. Left ventricular ejection fraction, by estimation, is 60 to 65%. The left ventricle has normal function. The left ventricle has no regional wall motion abnormalities. Indeterminate diastolic filling due to E-A fusion.  2. Right ventricular systolic function is normal. The right ventricular size is normal.  3. The mitral valve is normal in structure. No evidence of mitral valve regurgitation. No evidence of mitral stenosis.  4. The aortic valve is normal in structure. There is mild calcification of the aortic valve. There is moderate thickening of the aortic valve. Aortic valve regurgitation is not visualized. Mild to moderate aortic valve sclerosis/calcification is present, without any evidence of aortic stenosis.  5. The inferior vena cava is normal in size with greater than 50% respiratory variability, suggesting right atrial pressure of 3 mmHg. FINDINGS  Left Ventricle: Left ventricular ejection fraction, by estimation, is 60 to 65%. The left  ventricle has normal function. The left ventricle has no regional wall motion abnormalities. The left ventricular internal cavity size was normal in size. There is  no left ventricular hypertrophy. Indeterminate diastolic filling due to E-A fusion. Right Ventricle: The right ventricular size is normal. No increase in right ventricular wall thickness. Right ventricular systolic function is normal. Left Atrium: Left atrial size was normal  in size. Right Atrium: Right atrial size was normal in size. Pericardium: There is no evidence of pericardial effusion. Mitral Valve: The mitral valve is normal in structure. Mild to moderate mitral annular calcification. No evidence of mitral valve regurgitation. No evidence of mitral valve stenosis. Tricuspid Valve: The tricuspid valve is normal in structure. Tricuspid valve regurgitation is not demonstrated. No evidence of tricuspid stenosis. Aortic Valve: The aortic valve is normal in structure. There is mild calcification of the aortic valve. There is moderate thickening of the aortic valve. Aortic valve regurgitation is not visualized. Mild to moderate aortic valve sclerosis/calcification is present, without any evidence of aortic stenosis. Pulmonic Valve: The pulmonic valve was normal in structure. Pulmonic valve regurgitation is not visualized. No evidence of pulmonic stenosis. Aorta: The aortic root is normal in size and structure. Venous: The inferior vena cava is normal in size with greater than 50% respiratory variability, suggesting right atrial pressure of 3 mmHg. IAS/Shunts: No atrial level shunt detected by color flow Doppler.  LEFT VENTRICLE PLAX 2D LVIDd:         3.30 cm Diastology LVIDs:         2.30 cm LV e' medial:  4.68 cm/s LV PW:         1.10 cm LV e' lateral: 7.29 cm/s LV IVS:        1.10 cm  RIGHT VENTRICLE RV S prime:     11.90 cm/s TAPSE (M-mode): 1.8 cm LEFT ATRIUM             Index       RIGHT ATRIUM          Index LA diam:        3.50 cm 1.66 cm/m  RA  Area:     9.77 cm LA Vol (A2C):   38.2 ml 18.11 ml/m RA Volume:   18.20 ml 8.63 ml/m LA Vol (A4C):   55.5 ml 26.31 ml/m LA Biplane Vol: 50.1 ml 23.75 ml/m  AORTIC VALVE LVOT Vmax:   77.10 cm/s LVOT Vmean:  49.400 cm/s LVOT VTI:    0.144 m  AORTA Ao Asc diam: 2.90 cm  SHUNTS Systemic VTI: 0.14 m Dani Gobble Croitoru MD Electronically signed by Sanda Klein MD Signature Date/Time: 05/12/2021/3:19:09 PM    Final    VAS US CAROTID  Result Date: 05/12/2021 Carotid Arterial Duplex Study Patient Name:  DAMIEN [name]  Date of Exam:   05/12/2021 Medical Rec #: [id number]        Accession #:    [id number] Date of Birth: [birth year]        Patient Gender: M Patient Age:   89Y Exam Location:  [facility] Procedure:      VAS US CAROTID Referring Phys: [id number] ANASTASSIA DOUTOVA --------------------------------------------------------------------------------  Indications:       Right facial droop, aphasia, ride side drift. Risk Factors:      Hypertension, hyperlipidemia, Diabetes, no history of                    smoking, prior CVA. Other Factors:     Afib, CKD3. Comparison Study:  No previous exams Performing Technologist: Jody Hill RVT, RDMS  Examination Guidelines: A complete evaluation includes B-mode imaging, spectral Doppler, color Doppler, and power Doppler as needed of all accessible portions of each vessel. Bilateral testing is considered an integral part of a complete examination. Limited examinations for reoccurring indications may be performed as noted.  Right Carotid Findings: +----------+-------+-------+--------+------------------------+-----------------+  PSV    EDV    StenosisPlaque Description      Comments                    cm/s   cm/s                                                     +----------+-------+-------+--------+------------------------+-----------------+ CCA Prox  48     11                                     intimal                                                                    thickening        +----------+-------+-------+--------+------------------------+-----------------+ CCA Distal60     13             smooth, calcific and    tortuous                                          focal                                     +----------+-------+-------+--------+------------------------+-----------------+ ICA Prox  60     18     1-39%   heterogenous                              +----------+-------+-------+--------+------------------------+-----------------+ ICA Distal83     30                                                       +----------+-------+-------+--------+------------------------+-----------------+ ECA       151    0              calcific and                                                              heterogenous                              +----------+-------+-------+--------+------------------------+-----------------+ +----------+--------+-------+--------+-------------------+           PSV cm/sEDV cmsDescribeArm Pressure (mmHG) +----------+--------+-------+--------+-------------------+ Subclavian203            Stenotic                    +----------+--------+-------+--------+-------------------+ +---------+--------+--+--------+--+---------+ VertebralPSV cm/s70EDV  cm/s13Antegrade +---------+--------+--+--------+--+---------+  Left Carotid Findings: +----------+-------+-------+--------+------------------------+-----------------+           PSV    EDV    StenosisPlaque Description      Comments                    cm/s   cm/s                                                     +----------+-------+-------+--------+------------------------+-----------------+ CCA Prox  60     10                                     intimal                                                                   thickening         +----------+-------+-------+--------+------------------------+-----------------+ CCA Distal67     14             calcific, focal and     intimal                                           smooth                  thickening        +----------+-------+-------+--------+------------------------+-----------------+ ICA Prox  92     19     1-39%   calcific and smooth                       +----------+-------+-------+--------+------------------------+-----------------+ ICA Distal73     23                                                       +----------+-------+-------+--------+------------------------+-----------------+ ECA       310    0      >50%    heterogenous and                                                          calcific                                  +----------+-------+-------+--------+------------------------+-----------------+ +----------+--------+--------+----------------+-------------------+           PSV cm/sEDV cm/sDescribe        Arm Pressure (mmHG) +----------+--------+--------+----------------+-------------------+ [id number]             Multiphasic, WNL                    +----------+--------+--------+----------------+-------------------+ +---------+--------+--+--------+--+---------+  VertebralPSV cm/s38EDV cm/s12Antegrade +---------+--------+--+--------+--+---------+   Summary: Right Carotid: Velocities in the right ICA are consistent with a 1-39% stenosis.                Non-hemodynamically significant plaque <50% noted in the CCA. The                ECA appears <50% stenosed. Left Carotid: Non-hemodynamically significant plaque <50% noted in the CCA. The               ECA appears >50% stenosed. Vertebrals:  Bilateral vertebral arteries demonstrate antegrade flow. Subclavians: Right subclavian artery was stenotic. Normal flow hemodynamics were              seen in the left subclavian artery. *See table(s) above for measurements and  observations.  Electronically signed by Deitra Mayo MD on 05/12/2021 at 2:55:24 PM.    Final     Scheduled Meds:  apixaban  5 mg Oral BID   clopidogrel  75 mg Oral Daily   fenofibrate  54 mg Oral Daily   insulin aspart  0-9 Units Subcutaneous Q4H   memantine  5 mg Oral Daily   pantoprazole  20 mg Oral Daily    Continuous Infusions:  sodium chloride 75 mL/hr at 05/11/21 2234     LOS: 1 day     Annita Brod, MD [facility]   05/12/2021, 3:49 PM

## 2021-05-12 NOTE — Evaluation (Signed)
Physical Therapy Evaluation Patient Details Name: Duane [name] MRN: [id number] DOB: 1935/03/07 Today's Date: 05/12/2021   History of Present Illness  pt is 86 y.o. male with medical history significant of DM2, CKD,  CAD, OSA, A.fib on eliquis, HTN, HLD;  Presented with right facial droop, aphasia and right pronator drift started yesterday morning Was evaluated to [facility] did no had a CT. 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  Clinical Impression  Pt admitted with above diagnosis. Pt presents with generalized weakness but more pronounced on R with poor coordination RUE (see OT note) and decreased strength and coordination RLE. Pt stood from EOB with mod A +2 and took small steps to recliner with difficulty advancing RLE. Pt very fatigued after sidesteps and could not progress ambulation further. PTA he was able to get into power chair on his own. Recommend short stay in rehab at [facility] before returning to ALF.  Pt currently with functional limitations due to the deficits listed below (see PT Problem List). Pt will benefit from skilled PT to increase their independence and safety with mobility to allow discharge to the venue listed below.       Follow Up Recommendations SNF;Supervision/Assistance - 24 hour    Equipment Recommendations  None recommended by PT    Recommendations for Other Services       Precautions / Restrictions Precautions Precautions: Fall Restrictions Weight Bearing Restrictions: No      Mobility  Bed Mobility Overal bed mobility: Needs Assistance Bed Mobility: Supine to Sit     Supine to sit: Mod assist (HOb flat)     General bed mobility comments: pt able to slide LE's to EOB but increased time needed for all mobility and assist needed for elevation of trunk into sitting and wt shift to R    Transfers Overall transfer level: Needs  assistance Equipment used: 2 person hand held assist Transfers: Sit to/from [facility] Sit to Stand: Min assist;+2 physical assistance;From elevated surface Stand pivot transfers: Mod assist;+2 physical assistance       General transfer comment: fluctuating from min-mod +2 with onset of fatigue, R and posterior lean with difficulty correcting and difficulty stepping R foot  Ambulation/Gait             General Gait Details: took small sidesteps to recliner but very fatigued after this, could not tolerate further ambulation  Stairs            Wheelchair Mobility    Modified Rankin (Stroke Patients Only) Modified Rankin (Stroke Patients Only) Pre-Morbid Rankin Score: Moderately severe disability Modified Rankin: Severe disability     Balance Overall balance assessment: Needs assistance Sitting-balance support: Feet supported Sitting balance-Leahy Scale: Poor Sitting balance - Comments: loses balance posterior with LE mvmts   Standing balance support: Bilateral upper extremity supported;During functional activity Standing balance-Leahy Scale: Poor Standing balance comment: mod A to maintain standing                             Pertinent Vitals/Pain Pain Assessment: No/denies pain    Home Living Family/patient expects to be discharged to:: Assisted living               Home Equipment: Wheelchair - power;Walker - 2 wheels Additional Comments: pt at [facility], can go to rehab/  skilled section there    Prior Function Level of Independence: Needs assistance   Gait / Transfers Assistance Needed: mod indep with transfer and using electric w/c for mobility d/t baseline pain in low back and weakness of LE's.Occasionally takes a few steps leaning fwd onto counter or furniture  ADL's / Homemaking Assistance Needed: A provided with bathing and intermittent with dressing per report of daughter        Hand Dominance   Dominant  Hand: Right    Extremity/Trunk Assessment   Upper Extremity Assessment Upper Extremity Assessment: Defer to OT evaluation RUE Deficits / Details: shoulder flexion limited to <90 degrees with lag with sustained shoulder flexion. RUE Sensation: history of peripheral neuropathy RUE Coordination: decreased gross motor;decreased fine motor    Lower Extremity Assessment Lower Extremity Assessment: RLE deficits/detail;LLE deficits/detail RLE Deficits / Details: hip flex 3-/5, knee ext 4-/5, knee flex 3+/5 but has difficulty with proximal stabilization to tolerate testing and in [place], has difficulty advancing RLE RLE Sensation: decreased proprioception RLE Coordination: decreased gross motor;decreased fine motor LLE Deficits / Details: hip flex 3-/5, knee ext 4/5, knee flex 3+/5, again has diffciulty with proximal stabilization during testing LLE Sensation: decreased proprioception LLE Coordination: WNL    Cervical / Trunk Assessment Cervical / Trunk Assessment: Kyphotic  Communication   Communication:  (appears dysarthic)  Cognition Arousal/Alertness: Awake/alert Behavior During Therapy: WFL for tasks assessed/performed Overall Cognitive Status: Impaired/Different from baseline Area of Impairment: Following commands                       Following Commands: Follows one step commands consistently       General Comments: dementia at baseline, slowed processing but overall appropriate for participation. NT reports pt not following simple commands apropriately earlier in the day      General Comments General comments (skin integrity, edema, etc.): VSS, daughter present during session. Pt does not know where he is but she reports this is his baseline    Exercises     Assessment/Plan    PT Assessment Patient needs continued PT services  PT Problem List Decreased strength;Decreased activity tolerance;Decreased balance;Decreased mobility;Decreased coordination;Decreased  cognition;Decreased knowledge of use of DME;Decreased safety awareness;Decreased knowledge of precautions       PT Treatment Interventions DME instruction;Gait training;Functional mobility training;Therapeutic activities;Therapeutic exercise;Balance training;Neuromuscular re-education;Cognitive remediation;Patient/family education    PT Goals (Current goals can be found in the Care Plan section)  Acute Rehab PT Goals Patient Stated Goal: to be able to move better PT Goal Formulation: With patient/family Time For Goal Achievement: 05/26/21 Potential to Achieve Goals: Fair    Frequency Min 3X/week   Barriers to discharge Decreased caregiver support ALF    Co-evaluation PT/OT/SLP Co-Evaluation/Treatment: Yes Reason for Co-Treatment: Complexity of the patient's impairments (multi-system involvement);Necessary to address cognition/behavior during functional activity;For patient/therapist safety PT goals addressed during session: Mobility/safety with mobility;Balance         AM-PAC PT "6 Clicks" Mobility  Outcome Measure Help needed turning from your back to your side while in a flat bed without using bedrails?: A Little Help needed moving from lying on your back to sitting on the side of a flat bed without using bedrails?: A Lot Help needed moving to and from a bed to a chair (including a wheelchair)?: Total Help needed standing up from a chair using your arms (e.g., wheelchair or bedside chair)?: Total Help needed to walk in hospital room?: Total Help needed climbing 3-5 steps with a railing? :  Total 6 Click Score: 9    End of Session Equipment Utilized During Treatment: Gait belt Activity Tolerance: Patient tolerated treatment well Patient left: in chair;with call bell/phone within reach;with chair alarm set;with family/visitor present Nurse Communication: Mobility status PT Visit Diagnosis: Unsteadiness on feet (R26.81);Muscle weakness (generalized) (M62.81);History of falling  (Z91.81);Difficulty in walking, not elsewhere classified (R26.2)    Time: 1332-1400 PT Time Calculation (min) (ACUTE ONLY): 28 min   Charges:   PT Evaluation $PT Eval Moderate Complexity: [place]  Pager [phone] Office [place] 05/12/2021, 3:33 PM

## 2021-05-12 NOTE — Progress Notes (Signed)
OT Cancellation Note  Patient Details Name: Duane [name] MRN: [id number] DOB: 1935-02-19   Cancelled Treatment:    Reason Eval/Treat Not Completed: Patient at procedure or test/ unavailable pr OTF on arrival to room, OT will continue with efforts schedule permitting  Brileigh Sevcik OTR/L acute rehab services Office: [phone]  05/12/2021, 12:03 PM

## 2021-05-12 NOTE — Plan of Care (Signed)
  Problem: Education: Goal: Knowledge of disease or condition will improve Outcome: Progressing Goal: Knowledge of secondary prevention will improve Outcome: Progressing   

## 2021-05-12 NOTE — Evaluation (Signed)
SLP Cancellation Note  Patient Details Name: SPIRO AUSBORN MRN: [id number] DOB: 03/29/1935   Cancelled treatment:       Reason Eval/Treat Not Completed: Other (comment);Patient at procedure or test/unavailable (pt at MRI at this time, will continue efforts)   Macario Golds 05/12/2021, 9:18 AM  Kathleen Lime, MS [facility] SLP Acute Rehab Services Office [phone] Pager [phone]

## 2021-05-12 NOTE — Progress Notes (Signed)
TR[facility] night shift telemetry coverage note.  The nursing staff informed of staff the patient passes swallow screen.  Diet order was placed.  They also relate to [place] that the patient has become very restless, trying to get out of bed, interfering with medical care and presenting at high risk for injury.  Lorazepam 0.5 mg IVP x1 dose ordered.  We will continue to monitor.  Tennis Must, MD.

## 2021-05-12 NOTE — Progress Notes (Signed)
Carotid arterial duplex has been completed.  Results can be found under chart review under CV PROC. 05/12/2021 1:58 PM Eugenia Eldredge RVT, RDMS

## 2021-05-12 NOTE — Progress Notes (Signed)
Inpatient Diabetes Program Recommendations  AACE/ADA: New Consensus Statement on Inpatient Glycemic Control (2015)  Target Ranges:  Prepandial:   less than 140 mg/dL      Peak postprandial:   less than 180 mg/dL (1-2 hours)      Critically ill patients:  140 - 180 mg/dL   Lab Results  Component Value Date   GLUCAP 218 (H) 05/12/2021   HGBA1C 6.3 (H) 07/23/2019    Review of Glycemic Control Results for JUNG, YURCHAK (MRN [id number]) as of 05/12/2021 12:12  Ref. Range 05/12/2021 00:08 05/12/2021 04:25 05/12/2021 08:11 05/12/2021 11:20  Glucose-Capillary Latest Ref Range: 70 - 99 mg/dL 209 (H) 179 (H) 180 (H) 218 (H)   Diabetes history: Type 2 DM Outpatient Diabetes medications: Metformin 1000 mg BID, Januvia 25 mg QD Current orders for Inpatient glycemic control: Novolog 0-9 units Q4H  Inpatient Diabetes Program Recommendations:    Could consider Lantus 8 units QD.   Thanks, Bronson Curb, MSN, RNC-OB Diabetes Coordinator [phone] (8a-5p)

## 2021-05-12 NOTE — Evaluation (Signed)
Occupational Therapy Evaluation Patient Details Name: Duane [name] MRN: [id number] DOB: 06/15/1935 Today's Date: 05/12/2021    History of Present Illness pt is 86 y.o. male with medical history significant of DM2, CKD,  CAD, OSA, A.fib on eliquis, HTN, HLD;  Presented with right facial droop, aphasia and right pronator drift started yesterday morning Was evaluated to [facility] did no had a CT. 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   Clinical Impression   Pt seen with daughter in room to assist with confirming social and PLOF; pt agreeable initially lethargic, but improved with transition to sitting. Pt verbalizing R acute focal deficits with what appears as decreased strength, coordination, proprioception and sensation but noted pt with neuropathy to B hands at baseline. Pt currently requiring mod A for bed mobility, min-mod +2 for STS and toileting transfer, with mod A UB ADL's lead with R UE and mod-max  LB. At baseline pt was Mod Indep with functional transfers and using electric w/c for mobility, with back pain, and requiring A for bathing from ALF facility but mod inep dressing. OT will continue to follow acutely, with rec for post acute OT at time of d.c,.     Follow Up Recommendations  SNF;Supervision/Assistance - 24 hour    Equipment Recommendations  Other (comment) (TBD at next LOC)    Recommendations for Other Services       Precautions / Restrictions Precautions Precautions: Fall Restrictions Weight Bearing Restrictions: No      Mobility Bed Mobility Overal bed mobility: Needs Assistance Bed Mobility: Supine to Sit     Supine to sit: Mod assist (HOb flat)     General bed mobility comments: increased time good effort and management of LE's but increased effort and thearpist A required for transition to trunk to sitting upright    Transfers Overall  transfer level: Needs assistance   Transfers: Sit to/from Stand;Stand Pivot Transfers Sit to Stand: Min assist;+2 physical assistance Stand pivot transfers: Mod assist;+2 physical assistance       General transfer comment: fluctuating from min-mod +2 with onset of fatigue, slight R lean with difficulty correcting    Balance Overall balance assessment: Needs assistance                                         ADL either performed or assessed with clinical judgement   ADL Overall ADL's : Needs assistance/impaired Eating/Feeding: Supervision/ safety;Set up;Sitting   Grooming: Minimal assistance;Sitting           Upper Body Dressing : Moderate assistance;Sitting   Lower Body Dressing: Maximal assistance;Sit to/from stand   Toilet Transfer: Moderate assistance;+2 for physical assistance;Cueing for safety   Toileting- Clothing Manipulation and Hygiene: Maximal assistance               Vision Baseline Vision/History: No visual deficits Patient Visual Report: No change from baseline Vision Assessment?: No apparent visual deficits     Perception Perception Perception Tested?: Yes   Praxis Praxis Praxis tested?: Not tested    Pertinent Vitals/Pain Pain Assessment: No/denies pain     Hand Dominance Right   Extremity/Trunk Assessment Upper Extremity Assessment Upper Extremity Assessment: RUE deficits/detail RUE Deficits / Details: shoulder flexion limited to <90 degrees with lag with sustained shoulder flexion. RUE  Sensation: decreased proprioception;history of peripheral neuropathy RUE Coordination: decreased gross motor;decreased fine motor   Lower Extremity Assessment Lower Extremity Assessment: Defer to PT evaluation   Cervical / Trunk Assessment Cervical / Trunk Assessment: Kyphotic   Communication Communication Communication:  (appears dysarthic)   Cognition Arousal/Alertness: Awake/alert Behavior During Therapy: WFL for tasks  assessed/performed Overall Cognitive Status: History of cognitive impairments - at baseline                                 General Comments: dementia at baseline, slowed processing but overall appropriate for participation   General Comments       Exercises     Shoulder Instructions      Home Living Family/patient expects to be discharged to:: Assisted living                             Home Equipment: Wheelchair - power          Prior Functioning/Environment Level of Independence: Needs assistance  Gait / Transfers Assistance Needed: mod indep with transfer and using electric w/c for mobility d/t baseline pain in low back and weakness of LE's. ADL's / Homemaking Assistance Needed: A provided with bathing and intermittent with dressing per report of daughter            OT Problem List: Decreased strength;Decreased activity tolerance;Impaired balance (sitting and/or standing);Decreased coordination;Decreased safety awareness;Decreased knowledge of use of DME or AE;Decreased knowledge of precautions      OT Treatment/Interventions: Self-care/ADL training;Therapeutic exercise;Neuromuscular education;Energy conservation;DME and/or AE instruction;Therapeutic activities;Patient/family education;Balance training    OT Goals(Current goals can be found in the care plan section) Acute Rehab OT Goals Patient Stated Goal: to be able to move better OT Goal Formulation: With patient Time For Goal Achievement: 05/26/21 Potential to Achieve Goals: Good ADL Goals Pt Will Perform Upper Body Dressing: with set-up;sitting Pt Will Perform Lower Body Dressing: with min guard assist;with adaptive equipment;sitting/lateral leans Pt Will Transfer to Toilet: with supervision;bedside commode  OT Frequency: Min 2X/week   Barriers to D/C:            Co-evaluation              AM-PAC OT "6 Clicks" Daily Activity     Outcome Measure Help from another person  eating meals?: A Little Help from another person taking care of personal grooming?: A Little Help from another person toileting, which includes using toliet, bedpan, or urinal?: Total Help from another person bathing (including washing, rinsing, drying)?: A Lot Help from another person to put on and taking off regular upper body clothing?: A Lot Help from another person to put on and taking off regular lower body clothing?: A Lot 6 Click Score: 13   End of Session Equipment Utilized During Treatment: Gait belt Nurse Communication: Mobility status;Precautions  Activity Tolerance:   Patient left: in chair;with chair alarm set;with family/visitor present  OT Visit Diagnosis: Unsteadiness on feet (R26.81);Repeated falls (R29.6);Other symptoms and signs involving the nervous system (R29.898);Hemiplegia and hemiparesis Hemiplegia - Right/Left: Right Hemiplegia - dominant/non-dominant: Dominant Hemiplegia - caused by: Cerebral infarction                Time: 0102-7253 OT Time Calculation (min): 31 min Charges:  OT General Charges $OT Visit: 1 Visit OT Evaluation $OT Eval Moderate Complexity: 1 Mod  Srikar Chiang OTR/L acute rehab services Office: [phone]  05/12/2021, 2:50 PM

## 2021-05-13 ENCOUNTER — Inpatient Hospital Stay (HOSPITAL_COMMUNITY): Payer: Medicare Other

## 2021-05-13 DIAGNOSIS — Z955 Presence of coronary angioplasty implant and graft: Secondary | ICD-10-CM

## 2021-05-13 DIAGNOSIS — I2510 Atherosclerotic heart disease of native coronary artery without angina pectoris: Secondary | ICD-10-CM

## 2021-05-13 DIAGNOSIS — N179 Acute kidney failure, unspecified: Secondary | ICD-10-CM

## 2021-05-13 DIAGNOSIS — I480 Paroxysmal atrial fibrillation: Secondary | ICD-10-CM

## 2021-05-13 DIAGNOSIS — I442 Atrioventricular block, complete: Secondary | ICD-10-CM

## 2021-05-13 DIAGNOSIS — I63319 Cerebral infarction due to thrombosis of unspecified middle cerebral artery: Secondary | ICD-10-CM

## 2021-05-13 LAB — LIPID PANEL
Cholesterol: 192 mg/dL (ref 0–200)
HDL: 26 mg/dL — ABNORMAL LOW (ref >40)
LDL Cholesterol: UNDETERMINED mg/dL (ref 0–99)
Total CHOL/HDL Ratio: 7.4 RATIO
Triglycerides: 619 mg/dL — ABNORMAL HIGH (ref <150)
VLDL: UNDETERMINED mg/dL (ref 0–40)

## 2021-05-13 LAB — BASIC METABOLIC PANEL
Anion gap: 10 (ref 5–15)
Anion gap: 9 (ref 5–15)
BUN: 12 mg/dL (ref 8–23)
BUN: 13 mg/dL (ref 8–23)
CO2: 19 mmol/L — ABNORMAL LOW (ref 22–32)
CO2: 20 mmol/L — ABNORMAL LOW (ref 22–32)
Calcium: 8.8 mg/dL — ABNORMAL LOW (ref 8.9–10.3)
Calcium: 8.8 mg/dL — ABNORMAL LOW (ref 8.9–10.3)
Chloride: 105 mmol/L (ref 98–111)
Chloride: 104 mmol/L (ref 98–111)
Creatinine, Ser: 1.57 mg/dL — ABNORMAL HIGH (ref 0.61–1.24)
Creatinine, Ser: 1.71 mg/dL — ABNORMAL HIGH (ref 0.61–1.24)
GFR, Estimated: 43 mL/min — ABNORMAL LOW (ref >60)
GFR, Estimated: 39 mL/min — ABNORMAL LOW (ref >60)
Glucose, Bld: 210 mg/dL — ABNORMAL HIGH (ref 70–99)
Glucose, Bld: 181 mg/dL — ABNORMAL HIGH (ref 70–99)
Potassium: 3.1 mmol/L — ABNORMAL LOW (ref 3.5–5.1)
Potassium: 4.2 mmol/L (ref 3.5–5.1)
Sodium: 134 mmol/L — ABNORMAL LOW (ref 135–145)
Sodium: 133 mmol/L — ABNORMAL LOW (ref 135–145)

## 2021-05-13 LAB — MAGNESIUM: Magnesium: 1.7 mg/dL (ref 1.7–2.4)

## 2021-05-13 LAB — HEMOGLOBIN A1C
Hgb A1c MFr Bld: 8.6 % — ABNORMAL HIGH (ref 4.8–5.6)
Mean Plasma Glucose: 200 mg/dL

## 2021-05-13 LAB — GLUCOSE, CAPILLARY
Glucose-Capillary: 150 mg/dL — ABNORMAL HIGH (ref 70–99)
Glucose-Capillary: 165 mg/dL — ABNORMAL HIGH (ref 70–99)
Glucose-Capillary: 183 mg/dL — ABNORMAL HIGH (ref 70–99)
Glucose-Capillary: 187 mg/dL — ABNORMAL HIGH (ref 70–99)
Glucose-Capillary: 224 mg/dL — ABNORMAL HIGH (ref 70–99)
Glucose-Capillary: 224 mg/dL — ABNORMAL HIGH (ref 70–99)

## 2021-05-13 LAB — LDL CHOLESTEROL, DIRECT: Direct LDL: 60.9 mg/dL (ref 0–99)

## 2021-05-13 LAB — RESP PANEL BY RT-PCR (FLU A&B, COVID) ARPGX2
Influenza A by PCR: NEGATIVE
Influenza B by PCR: NEGATIVE
SARS Coronavirus 2 by RT PCR: NEGATIVE

## 2021-05-13 IMAGING — US US RENAL
1 series · 14 of 25 positions shown · non-contrast
Comparison: Ultrasound [DATE], CT [DATE]

CLINICAL DATA: Acute kidney injury

EXAM:
RENAL / URINARY TRACT ULTRASOUND COMPLETE

[Series 1: us renal · 14 of 29 slices shown]
[im 1/29]
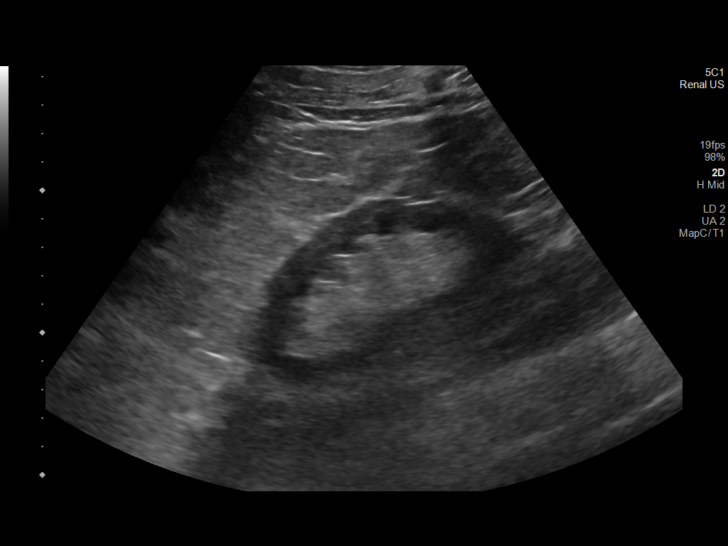
[im 3/29]
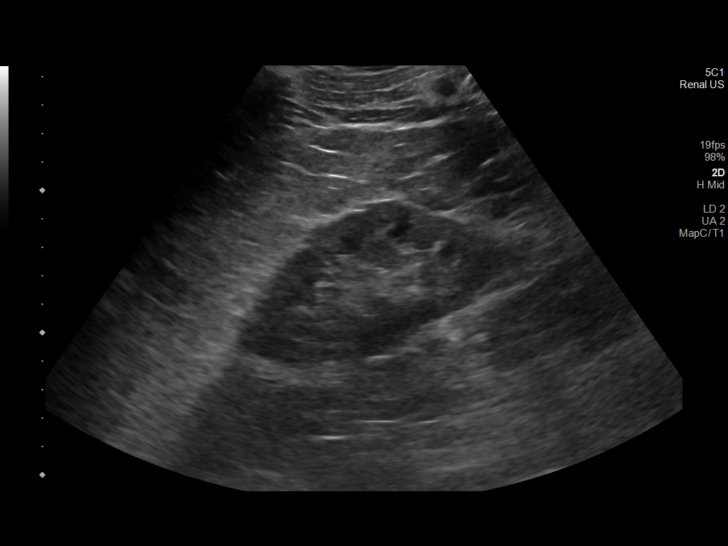
[im 5/29]
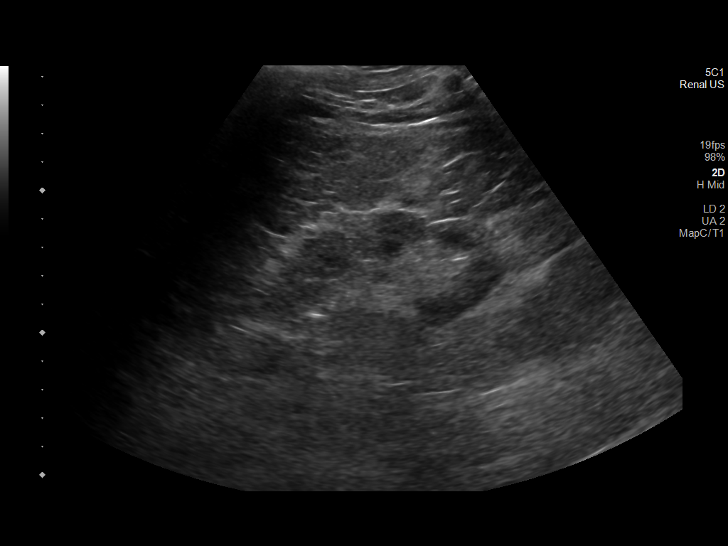
[im 8/29]
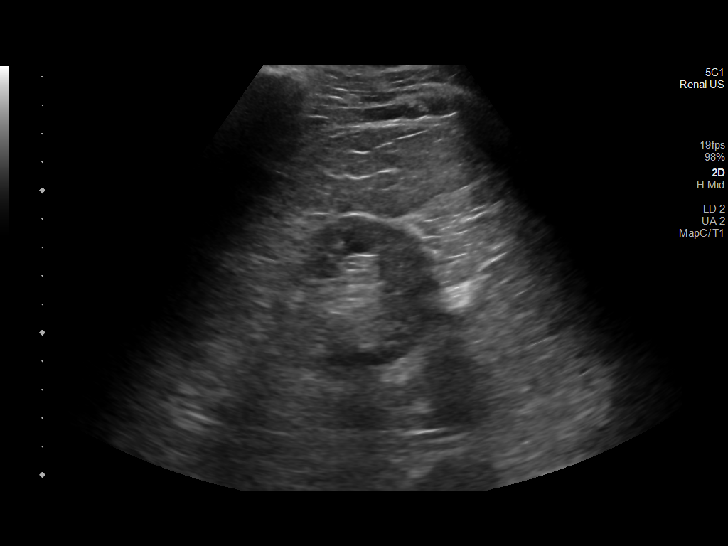
[im 10/29]
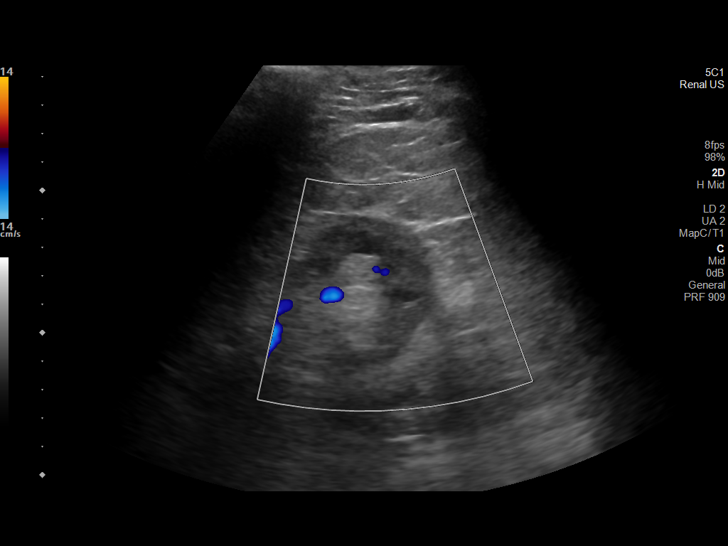
[im 11/29]
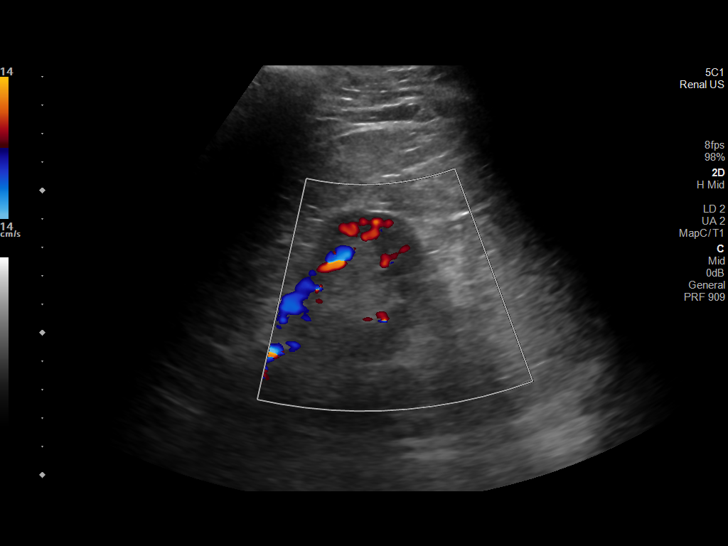
[im 13/29]
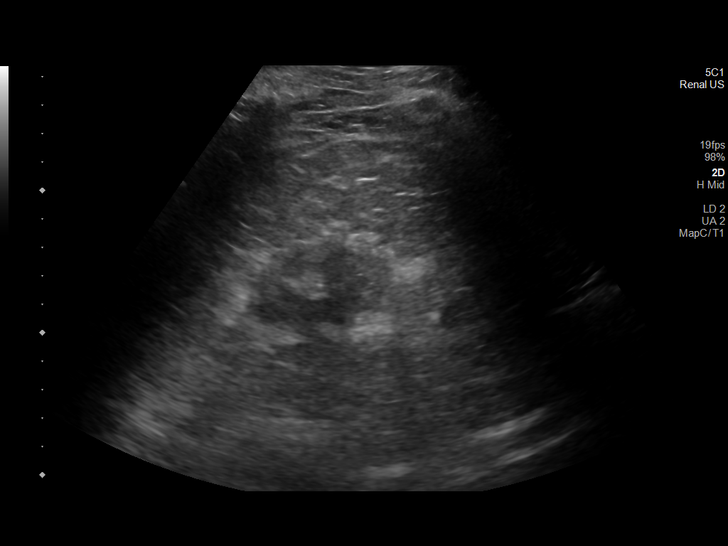
[im 16/29]
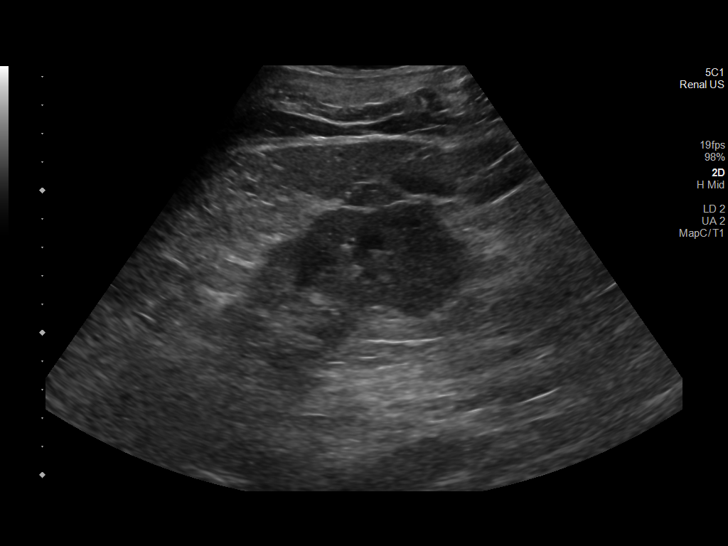
[im 18/29]
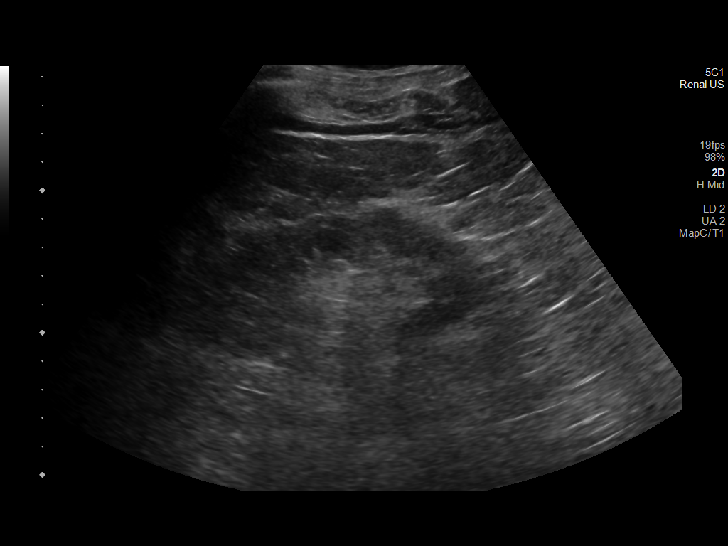
[im 19/29]
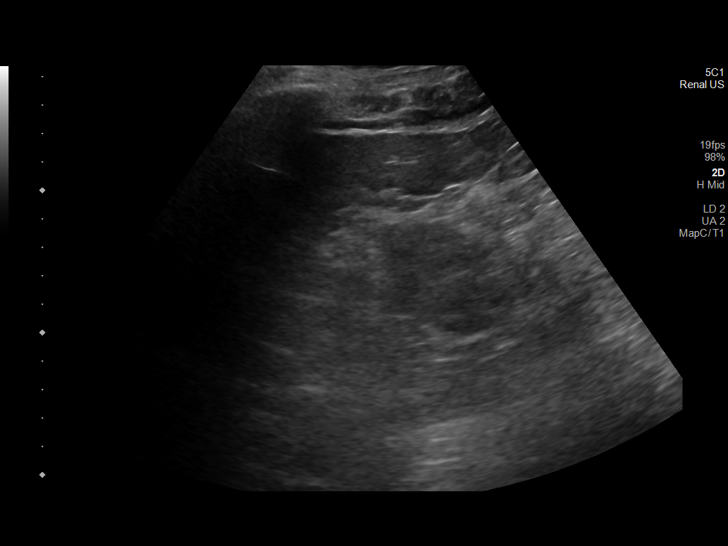
[im 22/29]
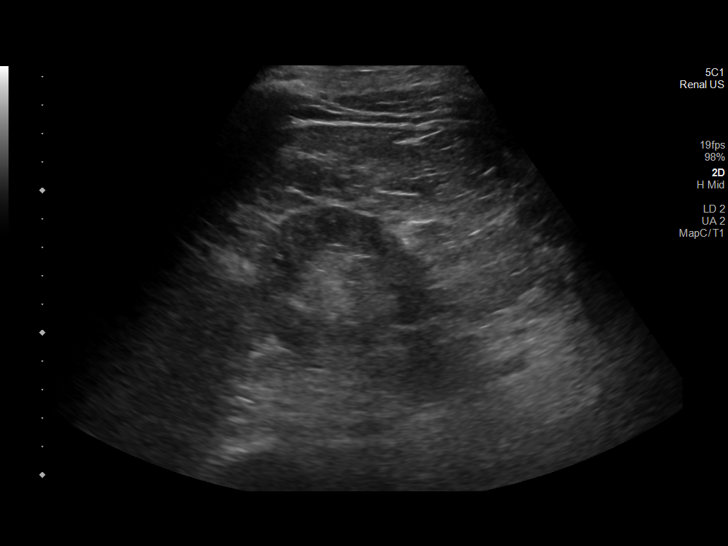
[im 24/29]
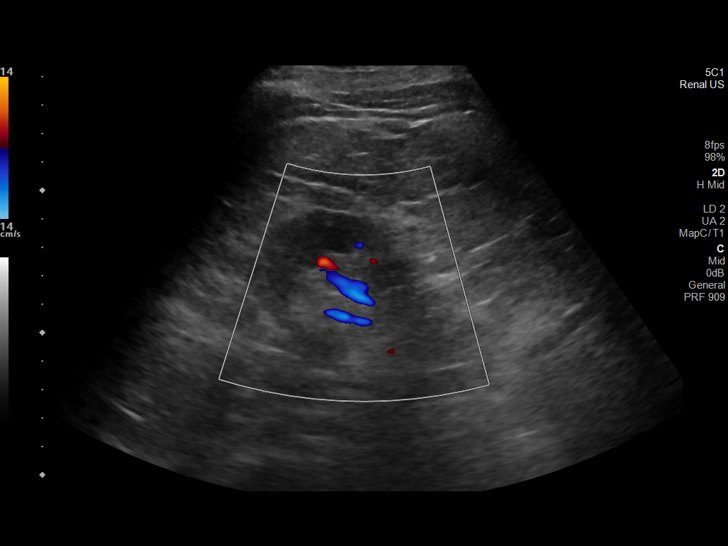
[im 26/29]
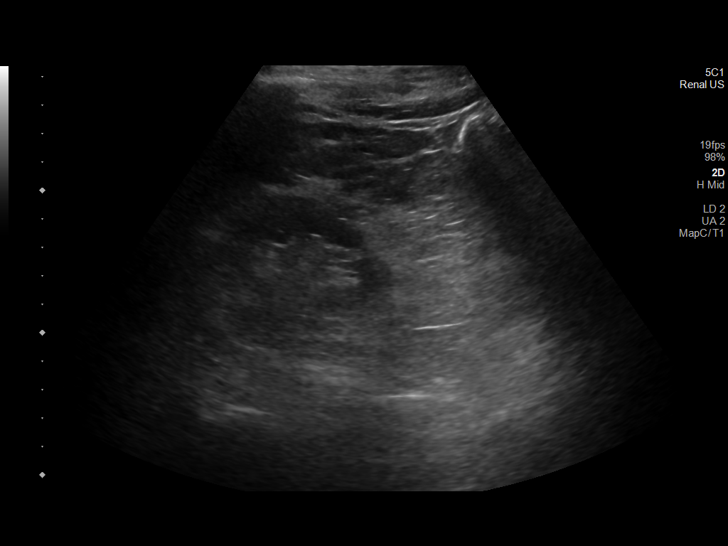
[im 29/29]
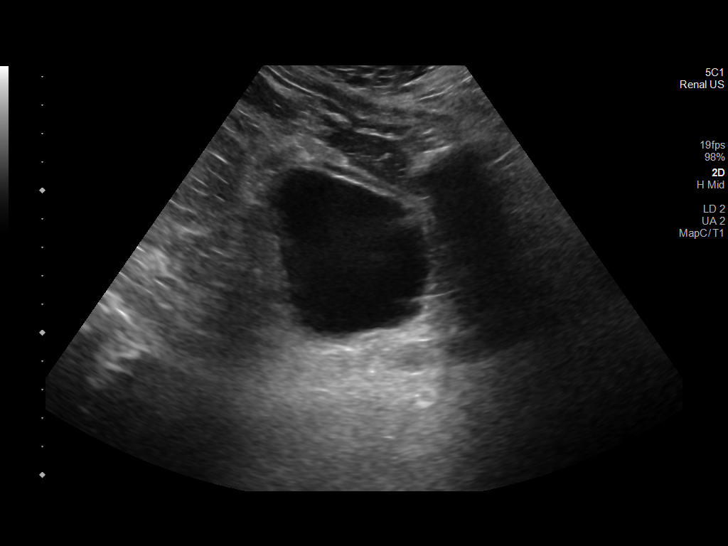

[14 of 25 positions shown; findings below may reference images not displayed]

FINDINGS: Right Kidney:

Renal measurements: 11.1 x 5.5 x 4.1 cm = volume: 130.8 mL.
Echogenicity within normal limits. No mass or hydronephrosis
visualized.

Left Kidney:

Renal measurements: 10 x 5.1 x 4.2 cm = volume: 171.8 mL.
Echogenicity within normal limits. No mass or hydronephrosis
visualized.

Bladder:

Appears normal for degree of bladder distention.

Other:

None.
IMPRESSION: Negative renal ultrasound

## 2021-05-13 MED ORDER — METOPROLOL TARTRATE 5 MG/5ML IV SOLN
5.0000 mg | Freq: Once | INTRAVENOUS | Status: AC
  Administered 2021-05-13: 5 mg via INTRAVENOUS
  Filled 2021-05-13: qty 5

## 2021-05-13 MED ORDER — INSULIN ASPART 100 UNIT/ML IJ SOLN: 0.0000 [IU] | Freq: Every day | INTRAMUSCULAR | Status: DC

## 2021-05-13 MED ORDER — METOPROLOL TARTRATE 5 MG/5ML IV SOLN: 5.0000 mg | Freq: Once | INTRAVENOUS | Status: DC

## 2021-05-13 MED ORDER — INSULIN GLARGINE 100 UNIT/ML ~~LOC~~ SOLN
5.0000 [IU] | Freq: Every day | SUBCUTANEOUS | Status: DC
  Administered 2021-05-13 – 2021-05-14 (×2): 5 [IU] via SUBCUTANEOUS
  Filled 2021-05-13 (×3): qty 0.05

## 2021-05-13 MED ORDER — POTASSIUM CHLORIDE CRYS ER 20 MEQ PO TBCR
40.0000 meq | EXTENDED_RELEASE_TABLET | ORAL | Status: AC
  Administered 2021-05-13 (×2): 40 meq via ORAL
  Filled 2021-05-13 (×2): qty 2

## 2021-05-13 MED ORDER — APIXABAN 2.5 MG PO TABS
2.5000 mg | ORAL_TABLET | Freq: Two times a day (BID) | ORAL | Status: DC
  Administered 2021-05-13 – 2021-05-15 (×4): 2.5 mg via ORAL
  Filled 2021-05-13 (×4): qty 1

## 2021-05-13 MED ORDER — FENOFIBRATE 160 MG PO TABS
160.0000 mg | ORAL_TABLET | Freq: Every day | ORAL | Status: DC
  Administered 2021-05-13 – 2021-05-15 (×3): 160 mg via ORAL
  Filled 2021-05-13 (×3): qty 1

## 2021-05-13 MED ORDER — MAGNESIUM SULFATE 2 GM/50ML IV SOLN
2.0000 g | Freq: Once | INTRAVENOUS | Status: AC
  Administered 2021-05-13: 2 g via INTRAVENOUS
  Filled 2021-05-13: qty 50

## 2021-05-13 MED ORDER — INSULIN ASPART 100 UNIT/ML IJ SOLN
0.0000 [IU] | Freq: Three times a day (TID) | INTRAMUSCULAR | Status: DC
  Administered 2021-05-13: 1 [IU] via SUBCUTANEOUS
  Administered 2021-05-14 – 2021-05-15 (×5): 2 [IU] via SUBCUTANEOUS

## 2021-05-13 MED ORDER — METOPROLOL TARTRATE 50 MG PO TABS
50.0000 mg | ORAL_TABLET | Freq: Two times a day (BID) | ORAL | Status: DC
  Administered 2021-05-13 – 2021-05-15 (×4): 50 mg via ORAL
  Filled 2021-05-13 (×4): qty 1

## 2021-05-13 MED ORDER — MAGNESIUM SULFATE IN D5W 1-5 GM/100ML-% IV SOLN
1.0000 g | Freq: Once | INTRAVENOUS | Status: AC
  Administered 2021-05-13: 1 g via INTRAVENOUS
  Filled 2021-05-13: qty 100

## 2021-05-13 MED ORDER — SODIUM CHLORIDE 0.9 % IV SOLN: INTRAVENOUS | Status: AC

## 2021-05-13 NOTE — Progress Notes (Addendum)
PROGRESS NOTE  Duane [name] YWV:[id number] DOB: [birth year] DOA: 05/11/2021 PCP: [name], MD  Brief narrative: 86 year old male with past medical history of diabetes mellitus type 2, stage III chronic kidney disease, obstructive sleep apnea, hypertension and atrial fibrillation on Eliquis, presented to the emergency room on 6/27 with slurred speech, right facial droop and pronator drift.  Patient had reportedly been taking medications including Eliquis and Plavix.  CT scan of head unremarkable, but MRI noted acute infarct in the posterior limb of the left internal capsule and patient admitted for stroke work-up to the hospitalist service.  MRA of the head noted mild narrowing of left P2/PCA segment.  Carotid Dopplers noted less than 50% stenosis of bilateral ICA and left ECA greater than 50% stenosis.  Echocardiogram done unremarkable.  Seen by PT and OT who are recommending skilled nursing.  EP cardiology consulted on 6/29 for pauses >7 seconds noted on telemetry overnight.   Assessment/Plan: Principal Problem:  Acute CVA [facility]): Neurology/stroke MD consulted and assisted with evaluation and management.  Has completed stroke evaluation.  Diagnosed with left internal capsule stroke.  MRI brain showed a left internal capsule stroke.  MRA head showed moderate narrowing of the proximal left P2 PCA segment.  2D echo with LVEF 60-65%.  Carotid ultrasound with right ICA 1-39% stenosis and left ECA >50% stenosed.  LDL 60.9 and A1c 8.6.  Stroke MD suspecting that the internal capsule stroke is due to small vessel disease.  Prior to admission, patient on Eliquis for A. fib and on Plavix for CAD.  Due to creatinine greater than 1.5, Eliquis has been decreased to 2.5 twice daily but if creatinine improves again to less than 1.5, may consider increasing back to 5 mg twice daily.  Although neurology indicates no need for Plavix, EP Cardiology discussed with patient's primary cardiologist who recommends  continuing Plavix for cardiac indications.  Therapies evaluated and recommend SNF.  Active Problems:   Essential hypertension: Permissive hypertension duration now completed.  As discussed with EP cardiology, discontinue sotalol indefinitely but resume and increase home metoprolol to double the dose i.e. 50 mg twice daily.    PAF (paroxysmal atrial fibrillation) (HCC) acute CVA does not look to be related to atrial fibrillation.  Overnight 6/28, had brief episode of A. fib with RVR and received a dose of IV metoprolol.  PTA, patient was on sotalol, Toprol and Eliquis.  Sotalol has been held since admission.  As per EP cardiology, A. fib breakthrough felt to be due to acute illness, holding sotalol and electrolyte abnormalities.  As per my discussion with Dr. Caryl Comes, discontinue sotalol indefinitely due to severe hypokalemia on admission, resume and increase metoprolol to 50 mg twice daily and continue Eliquis.  Outpatient follow-up with Dr. Gabrielle Dare, primary cardiologist in [facility] [place], [place].  CHA2DS2-VASc score of at least 7.  Pause: Noted 7.78-second pause on telemetry on 05/13/2021 at approximately 3:41 AM.  Asymptomatic.  EP cardiology consultation appreciated.  I discussed with Dr. Caryl Comes.  Suspected due to OSA, had received IV Lopressor approximately 2 hours prior and?  Increased vagal tone in the setting of CVA.  EP recommends discontinuing sotalol, no pacemaker, resuming metoprolol at increased dose.  Since patient will remain in hospital, continue monitoring on telemetry.  Hypokalemia/hypomagnesemia Hypokalemia corrected.  Replacing magnesium.  Attempt to keep potassium >4 and magnesium >2.    Diabetes mellitus type 2, controlled ([place]): Uncontrolled and fluctuating.  Continue SSI.  We will add low-dose Lantus.  A1c 8.6 suggests  poor outpatient control.  Acute kidney injury complicating CKD (chronic kidney disease), stage III b(HCC): Baseline creatinine as in Care Everywhere appears to be in the  1.3-1.4 range in October 2021.  Creatinine has now increased to 1.7.  May be due to poor oral intake.  Does appear somewhat dehydrated.  Brief IV fluid hydration.  Check renal ultrasound.  Follow BMP in AM.    Hyperlipidemia/hypertriglyceridemia: Triglycerides 619.  Neurology has increased the dose of fenofibrate to 160 mg daily.  Follow fasting lipid panels in 4 to 6 weeks as outpatient.    Overweight (BMI 25.0-29.9): Patient meets criteria BMI greater than 25.  OSA: Unsure if patient is on CPAP at home.   Code Status: Full code Family Communication: None at bedside. Disposition Plan: DC to SNF, hopefully 6/30 pending improvement in AKI.   Consultants: Neuro  EP cardiology  Procedures: Echocardiogram: preserved ejection fraction,   Carotid dopplers: no sig intracranial stenosis  Antimicrobials: None   DVT prophylaxis:  Eliquis  Subjective: Reports some left shoulder pain, ongoing slurred speech and right-sided weakness.  No chest pain or palpitations reported.   Objective: Vitals:   05/13/21 0848 05/13/21 1157  BP: 122/75 113/62  Pulse: 97 85  Resp: 20 18  Temp: 97.9 F (36.6 C) 98 F (36.7 C)  SpO2: 99% 96%    Intake/Output Summary (Last 24 hours) at 05/13/2021 1501 Last data filed at 05/13/2021 1129 Gross per 24 hour  Intake 1573.93 ml  Output --  Net 1573.93 ml   Filed Weights   05/11/21 1400 05/11/21 2049  Weight: 89.8 kg 93 kg   Body mass index is 29.42 kg/m.  Exam:  General: Elderly male, moderately built and nourished lying comfortably supine in bed without distress.  Oral mucosa dry. Cardiovascular: S1 and S2 heard, RRR.  No JVD, murmurs or pedal edema.  Telemetry personally reviewed: Had A. fib with RVR in the 110s-130s between 11:50 PM on 6/28 to approximately 7:15 AM on 6/29.  Also had a 7.78-second pause at 3:41 AM. Respiratory: Clear to auscultation bilaterally.  No increased work of breathing. Abdomen: soft, NT, ND, +BS  Musculoskeletal: No  clubbing cyanosis or lower extremity edema. Psychiatry: Appropriate, no evidence of psychoses Neurology: Alert and oriented x3.  Decreased left nasolabial fold.  Dysarthria present.  Right upper extremity grade 4+ by 5 power.  Others grade 5 x 5 power.  Left shoulder with good range of movements and not painful.   Data Reviewed: CBC: Recent Labs  Lab 05/11/21 1342 05/11/21 1356 05/11/21 1415 05/11/21 1453 05/12/21 1019  WBC 9.1  --  8.4  --  7.5  NEUTROABS 5.0  --   --   --   --   HGB 11.6* 11.6* 12.5* 11.9* 12.5*  HCT 36.0* 34.0* 37.2* 35.0* 37.1*  MCV 91.8  --  89.6  --  86.9  PLT 206  --  192  --  381   Basic Metabolic Panel: Recent Labs  Lab 05/11/21 1342 05/11/21 1356 05/11/21 1415 05/11/21 1453 05/12/21 1019 05/13/21 0223 05/13/21 1329  NA 135   < > 137 138 137 134* 133*  K 4.2   < > 4.5 4.1 3.6 3.1* 4.2  CL 103   < > 103 104 100 105 104  CO2 20*  --  22  --  23 19* 20*  GLUCOSE 234*   < > 226* 214* 149* 210* 181*  BUN 15   < > 15 14 10 12 13   CREATININE  1.56*   < > 1.64* 1.60* 1.38* 1.57* 1.71*  CALCIUM 8.9  --  9.3  --  9.1 8.8* 8.8*  MG  --   --   --   --   --  1.7  --    < > = values in this interval not displayed.   GFR: Estimated Creatinine Clearance: 36.8 mL/min (A) (by C-G formula based on SCr of 1.71 mg/dL (H)). Liver Function Tests: Recent Labs  Lab 05/11/21 1342 05/11/21 1415 05/12/21 1019  AST 25 30 23   ALT 16 15 14   ALKPHOS 49 48 52  BILITOT 0.6 0.7 0.7  PROT 6.5 6.8 6.9  ALBUMIN 3.3* 3.5 3.4*    Coagulation Profile: Recent Labs  Lab 05/11/21 1342 05/11/21 1415  INR 1.2 1.1    HbA1C: Recent Labs    05/12/21 1019  HGBA1C 8.6*   CBG: Recent Labs  Lab 05/12/21 2004 05/13/21 0018 05/13/21 0406 05/13/21 0726 05/13/21 1155  GLUCAP 165* 224* 183* 165* 224*   Lipid Profile: Recent Labs    05/12/21 1019 05/13/21 0223  CHOL 207* 192  HDL 30* 26*  LDLCALC UNABLE TO CALCULATE IF TRIGLYCERIDE OVER 400 mg/dL UNABLE TO  CALCULATE IF TRIGLYCERIDE OVER 400 mg/dL  TRIG 676* 619*  CHOLHDL 6.9 7.4  LDLDIRECT 57.0 60.9    Urine analysis:    Component Value Date/Time   COLORURINE YELLOW 05/11/2021 1610   APPEARANCEUR CLEAR 05/11/2021 1610   LABSPEC 1.011 05/11/2021 1610   PHURINE 6.0 05/11/2021 1610   GLUCOSEU >=500 (A) 05/11/2021 1610   HGBUR NEGATIVE 05/11/2021 1610   BILIRUBINUR NEGATIVE 05/11/2021 1610   KETONESUR NEGATIVE 05/11/2021 1610   PROTEINUR NEGATIVE 05/11/2021 1610   NITRITE NEGATIVE 05/11/2021 1610   LEUKOCYTESUR NEGATIVE 05/11/2021 1610    Recent Results (from the past 240 hour(s))  Resp Panel by RT-PCR (Flu A&B, Covid) Nasopharyngeal Swab     Status: None   Collection Time: 05/11/21  2:50 PM   Specimen: Nasopharyngeal Swab; Nasopharyngeal(NP) swabs in vial transport medium  Result Value Ref Range Status   SARS Coronavirus 2 by RT PCR NEGATIVE NEGATIVE Final    Comment: (NOTE) SARS-CoV-2 target nucleic acids are NOT DETECTED.  The SARS-CoV-2 RNA is generally detectable in upper respiratory specimens during the acute phase of infection. The lowest concentration of SARS-CoV-2 viral copies this assay can detect is 138 copies/mL. A negative result does not preclude SARS-Cov-2 infection and should not be used as the sole basis for treatment or other patient management decisions. A negative result may occur with  improper specimen collection/handling, submission of specimen other than nasopharyngeal swab, presence of viral mutation(s) within the areas targeted by this assay, and inadequate number of viral copies(<138 copies/mL). A negative result must be combined with clinical observations, patient history, and epidemiological information. The expected result is Negative.  Fact Sheet for Patients:  EntrepreneurPulse.com.au  Fact Sheet for Healthcare Providers:  IncredibleEmployment.be  This test is no t yet approved or cleared by the [place] FDA and  has been authorized for detection and/or diagnosis of SARS-CoV-2 by FDA under an Emergency Use Authorization (EUA). This EUA will remain  in effect (meaning this test can be used) for the duration of the COVID-19 declaration under Section 564(b)(1) of the Act, 21 U.S.C.section 360bbb-3(b)(1), unless the authorization is terminated  or revoked sooner.       Influenza A by PCR NEGATIVE NEGATIVE Final   Influenza B by PCR NEGATIVE NEGATIVE Final    Comment: (NOTE)  The Xpert Xpress SARS-CoV-2/FLU/RSV plus assay is intended as an aid in the diagnosis of influenza from Nasopharyngeal swab specimens and should not be used as a sole basis for treatment. Nasal washings and aspirates are unacceptable for Xpert Xpress SARS-CoV-2/FLU/RSV testing.  Fact Sheet for Patients: EntrepreneurPulse.com.au  Fact Sheet for Healthcare Providers: IncredibleEmployment.be  This test is not yet approved or cleared by the [place] FDA and has been authorized for detection and/or diagnosis of SARS-CoV-2 by FDA under an Emergency Use Authorization (EUA). This EUA will remain in effect (meaning this test can be used) for the duration of the COVID-19 declaration under Section 564(b)(1) of the Act, 21 U.S.C. section 360bbb-3(b)(1), unless the authorization is terminated or revoked.  Performed at [facility], [place] [address], [place], [place] [address]   MRSA Next Gen by PCR, Nasal     Status: None   Collection Time: 05/12/21  2:58 AM   Specimen: Nasal Mucosa; Nasal Swab  Result Value Ref Range Status   MRSA by PCR Next Gen NOT DETECTED NOT DETECTED Final    Comment: (NOTE) The GeneXpert MRSA Assay (FDA approved for NASAL specimens only), is one component of a comprehensive MRSA colonization surveillance program. It is not intended to diagnose MRSA infection nor to guide or monitor treatment for MRSA infections. Test performance is not FDA  approved in patients less than 72 years old. Performed at [facility], [place] [address], [place], [place] [address]   Resp Panel by RT-PCR (Flu A&B, Covid) Nasopharyngeal Swab     Status: None   Collection Time: 05/13/21 11:11 AM   Specimen: Nasopharyngeal Swab; Nasopharyngeal(NP) swabs in vial transport medium  Result Value Ref Range Status   SARS Coronavirus 2 by RT PCR NEGATIVE NEGATIVE Final    Comment: (NOTE) SARS-CoV-2 target nucleic acids are NOT DETECTED.  The SARS-CoV-2 RNA is generally detectable in upper respiratory specimens during the acute phase of infection. The lowest concentration of SARS-CoV-2 viral copies this assay can detect is 138 copies/mL. A negative result does not preclude SARS-Cov-2 infection and should not be used as the sole basis for treatment or other patient management decisions. A negative result may occur with  improper specimen collection/handling, submission of specimen other than nasopharyngeal swab, presence of viral mutation(s) within the areas targeted by this assay, and inadequate number of viral copies(<138 copies/mL). A negative result must be combined with clinical observations, patient history, and epidemiological information. The expected result is Negative.  Fact Sheet for Patients:  EntrepreneurPulse.com.au  Fact Sheet for Healthcare Providers:  IncredibleEmployment.be  This test is no t yet approved or cleared by the [place] FDA and  has been authorized for detection and/or diagnosis of SARS-CoV-2 by FDA under an Emergency Use Authorization (EUA). This EUA will remain  in effect (meaning this test can be used) for the duration of the COVID-19 declaration under Section 564(b)(1) of the Act, 21 U.S.C.section 360bbb-3(b)(1), unless the authorization is terminated  or revoked sooner.       Influenza A by PCR NEGATIVE NEGATIVE Final   Influenza B by PCR NEGATIVE NEGATIVE Final     Comment: (NOTE) The Xpert Xpress SARS-CoV-2/FLU/RSV plus assay is intended as an aid in the diagnosis of influenza from Nasopharyngeal swab specimens and should not be used as a sole basis for treatment. Nasal washings and aspirates are unacceptable for Xpert Xpress SARS-CoV-2/FLU/RSV testing.  Fact Sheet for Patients: EntrepreneurPulse.com.au  Fact Sheet for Healthcare Providers: IncredibleEmployment.be  This test is  not yet approved or cleared by the [place] and has been authorized for detection and/or diagnosis of SARS-CoV-2 by FDA under an Emergency Use Authorization (EUA). This EUA will remain in effect (meaning this test can be used) for the duration of the COVID-19 declaration under Section 564(b)(1) of the Act, 21 U.S.C. section 360bbb-3(b)(1), unless the authorization is terminated or revoked.  Performed at [facility], [place] [address] [place], [place] [address]       Studies: No results found.  Scheduled Meds:  apixaban  2.5 mg Oral BID   clopidogrel  75 mg Oral Daily   fenofibrate  160 mg Oral Daily   insulin aspart  0-9 Units Subcutaneous Q4H   memantine  5 mg Oral Daily   pantoprazole  20 mg Oral Daily    Continuous Infusions:  sodium chloride     magnesium sulfate bolus IVPB       LOS: 2 days     Vernell Leep, MD, [place], [facility]. [facility]  To contact the attending provider between 7A-7P or the covering provider during after hours 7P-7A, please log into the web site [email or web address] and access using universal [place] password for that web site. If you do not have the password, please call the hospital operator.

## 2021-05-13 NOTE — Progress Notes (Signed)
Inpatient Diabetes Program Recommendations  AACE/ADA: New Consensus Statement on Inpatient Glycemic Control (2015)  Target Ranges:  Prepandial:   less than 140 mg/dL      Peak postprandial:   less than 180 mg/dL (1-2 hours)      Critically ill patients:  140 - 180 mg/dL    Results for Duane [name], Duane [name] (MRN [id number]) as of 05/13/2021 12:37  Ref. Range 05/13/2021 00:18 05/13/2021 04:06 05/13/2021 07:26 05/13/2021 11:55  Glucose-Capillary Latest Ref Range: 70 - 99 mg/dL 224 (H)  3 units NOVOLOG  183 (H)  3 units NOVOLOG  165 (H)  2 units NOVOLOG  224 (H)     Home DM Meds: Metformin 1000 mg BID       Januvia 25 mg Daily  Current Orders: Novolog Sensitive Correction Scale/ SSI (0-9 units) Q4 hours     MD- Please consider increasing the Novolog SSI to Moderate scale (0-15 units) Q4 hours     --Will follow patient during hospitalization--  Wyn Quaker RN, MSN, CDE Diabetes Coordinator Inpatient Glycemic Control Team Team Pager: [phone] (8a-5p)

## 2021-05-13 NOTE — Progress Notes (Addendum)
STROKE TEAM PROGRESS NOTE    Interval History   Patient resting in bed, no family at bedside.  Overnight he went into RVR with HR > 140 at 2AM. Was treated with 5 mg IV metoprolol. Later experienced a pause of 7.78 seconds on telemetry. Cardiology consulted given pause.   From a stroke standpoint there is no need for permissive HTN at this time; home blood pressure regimen can be resumed along with rate control agents at the discretion of the patients primary team.   Pertinent Lab Work and Imaging    05/11/21 CT Head WO IV Contrast 1. No acute intracranial abnormality. Atrophy with chronic microvascular ischemic change in the white matter 2. Negative for cervical spine fracture.  Multilevel spondylosis.  05/12/21 MR Angio Head WO Contrast  Mild narrowing of the proximal left P2/PCA segment. Otherwise, unremarkable MR angiogram of the head.  05/11/21 MRI Brain WO IV Contrast 1. Acute infarct in the posterior limb of the left internal capsule.Trace edema without mass effect. 2. Moderate atrophy and chronic microvascular ischemic disease. 3. Paranasal sinus disease, as described above.  05/12/21 Echocardiogram Complete   1. Left ventricular ejection fraction, by estimation, is 60 to 65%. The left ventricle has normal function. The left ventricle has no regional wall motion abnormalities. Indeterminate diastolic filling due to E-A fusion.   2. Right ventricular systolic function is normal. The right ventricular size is normal.   3. The mitral valve is normal in structure. No evidence of mitral valve regurgitation. No evidence of mitral stenosis.   4. The aortic valve is normal in structure. There is mild calcification of the aortic valve. There is moderate thickening of the aortic valve. Aortic valve regurgitation is not visualized. Mild to moderate aortic valve sclerosis/calcification is present, without any evidence of aortic stenosis.   5. The inferior vena cava is normal in size with  greater than 50% respiratory variability, suggesting right atrial pressure of 3 mmHg.   05/12/21 Carotid US  Right Carotid: Velocities in the right ICA are consistent with a 1-39%  stenosis. Non-hemodynamically significant plaque <50% noted in the  CCA. The ECA appears <50% stenosed.   Left Carotid: Non-hemodynamically significant plaque <50% noted in the  CCA. The ECA appears >50% stenosed.   Physical Examination   Constitutional: Calm, appropriate for condition Cardiovascular: Normal RR Respiratory: No increased WOB   Mental status: Oriented to self, year not oriented to situation or location, follows commands to show two fingers gives thumbs up  Speech: Fluent, repetition and naming intact  Cranial nerves: EOMI, VFF, Right Facial Droop, Tongue midline  Motor: Normal bulk and tone. Right pronator drift.    Dlt Bic Tri FgS Grp HF  KnF KnE PIF DoF  R 5 5 5 5 5 5 5 5 5 5   L 5 5 5 5 5 5 5 5 5 5   Sensory: Intact to light tough throughout  Coordination: Intact FNF  Gait: Deferred   Assessment and Plan   Duane [name] is a 86 y.o. male w/pmh of atrial fibrillation on Eliquis, coronary artery disease status post PCI, CKD stage III, obstructive sleep apnea, diabetes, hypertension, hyperlipidemia, dementia, peripheral neuropathy who presents with falls, facial droop, slurred speech. 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   #Left Internal Capsule Stroke  Patient presented with the symptoms described above. At this time, stroke work up is essentially complete. MRI Brain showed a left internal capsule stroke. MRA Head w/moderate narrowing of the proximal left  P2 PCA segment. Echo w/EF 60 to 65 % and LA normal in size. CUS w/right ICA 1-39 % stenosis, left ECA > 50 % stenosed. Stroke labs w/LDL 60.9, A1C 8.6. Suspect that internal capsule stroke is in the setting of small vessel disease. His regimen is optimized from a stroke standpoint- he is on Eliquis for atrial fibrillation which  has been resumed this admission and is also on Plavix given CAD. Will not change regimen at this time. Of note, he has had myalgias with statin therapy in the past thus was taking Fenofibrate and Omega 3 Fatty Acids.  -Continue Eliquis 5 mg BID + Plavix 75 mg QD for stroke prevention -Fenofibrate increased from 48 to 160 mg this admission for better triglyceride/cholesterol control  -Ambulatory referral to neurology for stroke follow up has been placed  #Hypertension He has a history of HTN and takes Losartan 100 mg QD and Metoprolol 25 BID at home. Currently blood pressure is trending 110-160 range. Permissive HTN window is over, primary team can began to bring down blood pressure gently avoiding acute drops  - No need for permissive hypertension  - Reduce blood pressure gently   #Hyperlipidemia From a stroke prevention stand point, the LDL goal is < 70, LDL is 60.9 which is at goal.   #Stroke Diabetes Screening  #Hx of Diabetes  Hemoglobin A1C 8.6, patient will need to follow up with outpatient PCP for diabetic management. Goal A1c is < 7 from a stroke reduction stand point.   Hospital day # 2  Ruta Hinds, NP  [facility] Nurse Practitioner Patient seen and discussed with attending physician Dr. Erlinda Hong   Neurology will sign off at this time, please reach out via [place] with any questions   ATTENDING NOTE: I reviewed above note and agree with the assessment and plan. Pt was seen and examined.   Overnight event noted.  He went into A. fib RVR, received IV metoprolol and then developed long pauses on telemetry however asymptomatic.  Cardiology consulted, from neuro standpoint, permissive hypertension window is over, agree with resume beta-blocker for rate control.  Also discussed with Dr. Caryl Comes, from neuro standpoint, Plavix is not needed if patient on Eliquis already.  Also discussed with pharmacy, patient was on Eliquis 5 mg twice daily, however given elevated creatinine more  than 1.5, will decrease Eliquis to 2.5 mg twice daily.  Continue to watch creatinine, if lower than 1.5, may consider back to Eliquis 5 mg twice daily.  Patient stroke work-up completed, direct LDL 60, A1c 8.6.  However, TG still high more than 600.  He is on Tricor 48 mg, recommend to increase to 160 mg for better HLD control.  No need statin at this time given LDL at goal and advanced age.  Need aggressive stroke risk factor modification.  PT/OT recommend SNF.  For detailed assessment and plan, please refer to above as I have made changes wherever appropriate.   Neurology will sign off. Please call with questions. Pt will follow up with stroke clinic NP at [facility] in about 4 weeks. Thanks for the consult.  Rosalin Hawking, MD PhD Stroke Neurology 05/13/2021 10:48 AM     To contact Stroke Continuity provider, please refer to [email or web address] After hours, contact General Neurology

## 2021-05-13 NOTE — Evaluation (Signed)
Speech Language Pathology Evaluation Patient Details Name: Duane [name] MRN: [id number] DOB: 11/02/1935 Today's Date: 05/13/2021 Time: 0905-1005 SLP Time Calculation (min) (ACUTE ONLY): 60 min  Problem List:  Patient Active Problem List   Diagnosis Date Noted   Overweight (BMI 25.0-29.9) 05/12/2021   Stroke (cerebrum) ([place]) 05/11/2021   CKD (chronic kidney disease), stage III ([place]) 05/11/2021   Hyperlipidemia 05/11/2021   Hyponatremia 09/05/2019   Diarrhea 09/05/2019   UTI (urinary tract infection) 09/05/2019   Encephalopathy 07/23/2019   Essential hypertension 09/08/2016   PAF (paroxysmal atrial fibrillation) ([place]) 09/08/2016   Diabetes mellitus type 2, controlled ([place]) 09/08/2016   ARF (acute renal failure) ([place]) 09/08/2016   Normochromic normocytic anemia 09/08/2016   Abdominal pain 09/08/2016   Past Medical History:  Past Medical History:  Diagnosis Date   A-fib ([place])    Anemia    Angina pectoris (HCC)    Arthritis    Carpal tunnel syndrome    Cobalamin deficiency    Coronary artery disease    Diabetes mellitus without complication (HCC)    Eczema    Hyperlipemia    Hypertension    Kidney disease, chronic, stage III (moderate, EGFR 30-59 ml/min) (HCC)    Lung infiltrate    Myocardial infarct, old    Prostatic hypertrophy    Sleep apnea, obstructive    Squamous cell cancer of lip    Past Surgical History:  Past Surgical History:  Procedure Laterality Date   CORONARY ANGIOPLASTY WITH STENT PLACEMENT     HPI:  86 yo male with right sided weakness, several falls PTA. 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  Pt PMH + for Afib, HTN, DM.  On 6/28 pt experienced Afib with RVR. Speech/language eval ordered. Pt passed [facility] swallow screen.   Assessment / Plan / Recommendation Clinical Impression  [employer] Mental Status Exam conducted with pt scoring 16/30 - which per scoring is indicative of dementia.  Pt has known baseline dementia - but no family  present to establish baseline function nor changes with this CVA. Areas of strengths included orientation and recall of 3/4 narrative questions.   Other strengths include his pragmatics as he appropriately takes turns in conversation and uses humor appropriately. 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  He reports he manages his own medications, appointments, etc - however per note, he lives at [facility].  Advised he have some support with this for safety with premorbid memory difficulties.   Pt is limited by his dysarthria, facial, hypoglossal nerve involvement and potentially vagus nerve evidenced by imprecise articulation (approx 50% intelligible at sentence level)- most notably with longer phrases and decreased phonatory strength.  Using teach back and demonstration pt able to use pacing and overarticulation to maximize speech intelligibilty to approx 85% at sentence level with max cues initially - fading to mod as session progressed.  Pt able to articulate his speech, right hand and leg changes with this event demonstrating intellectual awareness, however he benefits from verbal cues for potential ramifications of these difficulties and need for rehab.  Thanks for this consult.  Will follow up to provide functional compensations for his dysarthria and to educate family to findings/establish baseline. Pt agreeable to plan and RN informed.    SLP Assessment  SLP Visit Diagnosis: Dysarthria and anarthria (R47.1);Cognitive communication deficit (R41.841)    Follow Up Recommendations  Skilled Nursing facility    Frequency and Duration  SLP Evaluation Cognition  Overall Cognitive Status: No family/caregiver present to determine baseline cognitive functioning Arousal/Alertness: Awake/alert Orientation Level: Oriented to person;Oriented to place;Oriented to time;Oriented to situation Memory: Impaired Memory Impairment: Storage deficit;Retrieval deficit  (recalled 4/5 words with multiple choice cues and 1/5 with category cue) Problem Solving: Impaired Problem Solving Impairment: Functional basic (pt has mitts in place due to safety risk and did not verbalize call bell in room for use, able to articulate inability to get OOB) Safety/Judgment: Impaired Comments: Pt able to articulate fall risk but asks if he can go home       Comprehension  Auditory Comprehension Overall Auditory Comprehension: Appears within functional limits for tasks assessed Yes/No Questions: Within Functional Limits (5/5 complex questions) Commands: Within Functional Limits Reading Comprehension Reading Status: Not tested (read white board in room with correct date, etc)    Expression Expression Primary Mode of Expression: Verbal Verbal Expression Overall Verbal Expression: Appears within functional limits for tasks assessed Initiation: No impairment Repetition: No impairment Naming: Not tested Pragmatics: No impairment Interfering Components: Speech intelligibility Non-Verbal Means of Communication: Not applicable Written Expression Dominant Hand: Right Written Expression: Not tested (pt with decreased ability to feed self, appears ataxic)   Oral / Motor  Oral Motor/Sensory Function Overall Oral Motor/Sensory Function: Moderate impairment Facial ROM: Reduced right;Suspected CN VII (facial) dysfunction Facial Symmetry: Abnormal symmetry right;Suspected CN VII (facial) dysfunction Facial Strength: Reduced right;Suspected CN VII (facial) dysfunction Facial Sensation: Within Functional Limits Lingual ROM: Suspected CN XII (hypoglossal) dysfunction Lingual Symmetry: Within Functional Limits Lingual Strength: Reduced Lingual Sensation: Other (Comment);Suspected CN VII (facial) dysfunction-anterior 2/3 tongue (pt reports taste changes and texture sensitivity with this cva) Velum: Within Functional Limits Mandible: Other (Comment) (dnt) Motor Speech Overall  Motor Speech: Impaired Respiration: Impaired Level of Impairment: Sentence Phonation: Low vocal intensity (mildly low amplitude) Resonance: Within functional limits Articulation: Impaired Intelligibility: Intelligible Motor Speech Errors: Not applicable Effective Techniques: Slow rate;Pacing;Over-articulate   GO                    Macario Golds 05/13/2021, 10:41 AM  Kathleen Lime, MS [facility] SLP Acute Rehab Services Office [phone] Pager [phone]

## 2021-05-13 NOTE — Progress Notes (Signed)
Physical Therapy Treatment Patient Details Name: Duane [name] MRN: [id number] DOB: 1934/12/23 Today's Date: 05/13/2021    History of Present Illness pt is 86 y.o. male with medical history significant of DM2, CKD,  CAD, OSA, A.fib on eliquis, HTN, HLD;  Presented with right facial droop, aphasia and right pronator drift started yesterday morning Was evaluated to [facility] did no had a CT. 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    PT Comments    Patient progressing towards physical therapy goals. Patient ambulated 62' with minA+2 and initially B UE support progressing to no UE support. Patient following simple commands consistently and A&Ox3 this session. Patient motivated to return to ambulation. Patient performed repeated sit to stand x 5 from recliner with min guard. Continue to recommend SNF for ongoing Physical Therapy.       Follow Up Recommendations  SNF;Supervision/Assistance - 24 hour     Equipment Recommendations  None recommended by PT    Recommendations for Other Services       Precautions / Restrictions Precautions Precautions: Fall Restrictions Weight Bearing Restrictions: No    Mobility  Bed Mobility Overal bed mobility: Needs Assistance Bed Mobility: Supine to Sit     Supine to sit: Min assist     General bed mobility comments: minA for repositioning hips towards EOB. Increased time required to complete    Transfers Overall transfer level: Needs assistance Equipment used: 2 person hand held assist Transfers: Sit to/from Stand Sit to Stand: Min assist;+2 physical assistance;+2 safety/equipment         General transfer comment: minA+2 for sit to stand initially. Repeated sit to stands x5 with min guard  Ambulation/Gait Ambulation/Gait assistance: Min assist;+2 physical assistance;+2 safety/equipment Gait Distance (Feet): 40  Feet Assistive device: 2 person hand held assist;1 person hand held assist;None Gait Pattern/deviations: Step-through pattern;Decreased stride length;Decreased weight shift to right;Decreased weight shift to left;Drifts right/left;Wide base of support Gait velocity: decreased   General Gait Details: initially with B UE support for 20', single UE support x 10', no UE support x 10'. MinA+2 throughout for balance and safety. High guard position with no UE support due to fear of falling.   Stairs             Wheelchair Mobility    Modified Rankin (Stroke Patients Only) Modified Rankin (Stroke Patients Only) Pre-Morbid Rankin Score: Moderately severe disability Modified Rankin: Moderately severe disability     Balance Overall balance assessment: Needs assistance Sitting-balance support: Feet supported Sitting balance-Leahy Scale: Poor Sitting balance - Comments: loses balance posterior with LE mvmts   Standing balance support: No upper extremity supported;During functional activity Standing balance-Leahy Scale: Poor Standing balance comment: reliant on external assist                            Cognition Arousal/Alertness: Awake/alert Behavior During Therapy: WFL for tasks assessed/performed Overall Cognitive Status: History of cognitive impairments - at baseline Area of Impairment: Following commands                       Following Commands: Follows one step commands consistently       General Comments: dementia at baseline, however A&O x 3. Reading board in room for correct date.      Exercises      General  Comments        Pertinent Vitals/Pain Pain Assessment: No/denies pain    Home Living                      Prior Function            PT Goals (current goals can now be found in the care plan section) Acute Rehab PT Goals Patient Stated Goal: to be able to move better PT Goal Formulation: With patient/family Time For  Goal Achievement: 05/26/21 Potential to Achieve Goals: Fair Progress towards PT goals: Progressing toward goals    Frequency    Min 3X/week      PT Plan Current plan remains appropriate    Co-evaluation              AM-PAC PT "6 Clicks" Mobility   Outcome Measure  Help needed turning from your back to your side while in a flat bed without using bedrails?: A Little Help needed moving from lying on your back to sitting on the side of a flat bed without using bedrails?: A Little Help needed moving to and from a bed to a chair (including a wheelchair)?: Total Help needed standing up from a chair using your arms (e.g., wheelchair or bedside chair)?: Total Help needed to walk in hospital room?: Total Help needed climbing 3-5 steps with a railing? : Total 6 Click Score: 10    End of Session Equipment Utilized During Treatment: Gait belt Activity Tolerance: Patient tolerated treatment well Patient left: in chair;with call bell/phone within reach;with chair alarm set Nurse Communication: Mobility status PT Visit Diagnosis: Unsteadiness on feet (R26.81);Muscle weakness (generalized) (M62.81);History of falling (Z91.81);Difficulty in walking, not elsewhere classified (R26.2)     Time: 3557-3220 PT Time Calculation (min) (ACUTE ONLY): 24 min  Charges:  $Gait Training: 23-37 mins                     Brigitta Pricer A. Gilford Rile PT, DPT Acute Rehabilitation Services Pager [phone] Office [phone]    Linna Hoff 05/13/2021, 5:20 PM

## 2021-05-13 NOTE — Progress Notes (Addendum)
HOSPITAL MEDICINE OVERNIGHT EVENT NOTE    Notified by nursing that patient has gone into rapid atrial fibrillation, heart rates in excess of 140 bpm.  Blood pressures are stable.  Patient is not exhibiting any shortness of breath or chest pain.  Chart reviewed, patient has a known history of atrial fibrillation and is typically on metoprolol 25 mg p.o. twice daily.  I believe that this has been held in an attempt to allow for permissive hypertension in the setting of acute stroke.  5 mg of intravenous metoprolol administered.  This is resulted in some improvement in heart rate without returning to baseline.  We will additionally obtain stat chemistry and magnesium and replace as necessary to target.  Continue to monitor patient on telemetry.  Continue to monitor for symptoms.  Duane Emerald  MD [facility]   ADDENDUM (3:40AM 6/29)  Notified by nursing that patient's potassium is 3.1 and magnesium is 1.7.  Provided patient with 40 mill equivalents of potassium chloride p.o. every 4 hours x2 doses.  As well as 1 g of intravenous magnesium sulfate.  Duane [name]   ADDENDUM (4am 6/29)  Notified by nursing that patient experienced a prolonged pause of 7.78 seconds on telemetry at approximately 3:42 AM.  This was not associated with any symptoms.  Patient remained hemodynamically stable and remained so now.  After the pause, patient went back into rapid atrial fibrillation, heart rates averaging in the 110's.  I question whether the administration of IV metoprolol brought about this prolonged pause.  We will abstain from further dosing of intravenous metoprolol.  I have discussed the case with the [facility] cardiology APP and they have agreed to consult on the patient later this morning and provide input on the pause as well as management of the patient's atrial fibrillation.  Duane [name]

## 2021-05-13 NOTE — Plan of Care (Signed)
  Problem: Education: Goal: Knowledge of disease or condition will improve Outcome: Progressing Goal: Knowledge of secondary prevention will improve Outcome: Progressing   

## 2021-05-13 NOTE — Consult Note (Addendum)
ELECTROPHYSIOLOGY CONSULT NOTE    Patient ID: CALOGERO GEISEN MRN: [id number], DOB/AGE: 03/14/1935 86 y.o.  Admit date: 05/11/2021 Date of Consult: 05/13/2021  Primary Physician: [name], MD Primary Cardiologist: Einar Crow, MD  Electrophysiologist:  New  Referring Provider: Dr. Algis Liming  Patient Profile: Duane [name] is a 86 y.o. male with a history of atrial fibrillation on Eliquis, CAD s/p PCI, CKD III, OSA, DM2, HTN, HLD, dementia, and peripheral neuropathy who is being seen today for the evaluation of atrial fibrillation and 7.8 second pause at the request of Dr. Algis Liming.  HPI:  Duane [name] is a 86 y.o. male medical history as above.   Follows with Cardiology in [place] for AF and CAD s/p stenting 04/2019. AF managed on sotalol, lopressor, and Eliquis  Had syncope 06/2019 and Zio patch ordered which "did not show any indications of low heart rate". Not available for review. Son who is present in the room states this was in the setting of an admission in which he received sedating meds. Other than this event, he has not had syncope or near syncope.   Pt presented with falls, facial droop, and slurred speech. 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 He is "optimized" from a CVA regimen on Eliquis for PAF and plavix for h/o CAD. He has had myalgias on statins and is managed on Fenofibrate and Omega 3 Fatty Acids. Fenofibrate increased.  Overnight patient went into AF with RVR up to 140s. He was given 5 mg IV lopressor. At Approx 0330 pt had a 7.78 second pause, with ventricular escape, short ~2.5 second pause after, then back into AF with RVR. Appeared to continue in AF underlying without "post conversion" pause.   Pt is demented at baseline, but functional with ADLs. He lives in [place] at [facility]. He is able to do the majority of his ADLs with minimal assistance or monitoring   Physical Therapy notes demonstrated generalized weakness  with R>L with poor coordination of his RUE and decreased strength and coordination of his RLE. Difficulty progressing RLE with ambulation noted, and very fatigued after transferring from bed to chair. Pt recommended for SNF.   Pt is in no acute distress. States he can feel his "heart beating funny". He initially denies any h/o syncope. When asked about the monitor in 06/2019, his son states he had been recently admitted, became agitated and reports that this was in the setting of sedating meds. As above, Zio was unrevealing. Pt and family deny any further syncopal events.  He was diagnosed with OSA in 2015/2016 but has not used CPAP chronically due to mask intolerance. His son says you can hear him snoring from "down the hall". Pt denies SOB at rest, exertional chest pain, or edema.   Past Medical History:  Diagnosis Date   A-fib ([place])    Anemia    Angina pectoris (HCC)    Arthritis    Carpal tunnel syndrome    Cobalamin deficiency    Coronary artery disease    Diabetes mellitus without complication (HCC)    Eczema    Hyperlipemia    Hypertension    Kidney disease, chronic, stage III (moderate, EGFR 30-59 ml/min) (HCC)    Lung infiltrate    Myocardial infarct, old    Prostatic hypertrophy    Sleep apnea, obstructive    Squamous cell cancer of lip      Surgical History:  Past Surgical History:  Procedure Laterality Date  CORONARY ANGIOPLASTY WITH STENT PLACEMENT       Medications Prior to Admission  Medication Sig Dispense Refill Last Dose   acetaminophen (TYLENOL) 500 MG tablet Take 1,000 mg by mouth See admin instructions. 1070m twice daily and 10065mevery 6 hours as needed for fever, pain, headache   05/10/2021   apixaban (ELIQUIS) 5 MG TABS tablet Take 5 mg by mouth 2 (two) times daily.   05/10/2021 at night   Ascorbic Acid (VITAMIN C) 1000 MG tablet Take 1,000 mg by mouth daily.   05/10/2021   B Complex-C (SUPER B-COMPLEX + VITAMIN C PO) Take 1 tablet by mouth daily. uper b  complex + c 15058mablet   05/10/2021   Carboxymethylcellulose Sod PF 1 % GEL Apply 1 application to eye 3 (three) times daily as needed (dry eyes).   unknown   Cholecalciferol (VITAMIN D-3) 125 MCG (5000 UT) TABS Take 5,000 Units by mouth daily.   05/10/2021   clopidogrel (PLAVIX) 75 MG tablet Take 75 mg by mouth daily.   05/10/2021 at morning   EPINEPHrine 0.3 mg/0.3 mL IJ SOAJ injection Inject 0.3 mg into the muscle once as needed (severe allergic reaction).   unknown   fenofibrate (TRICOR) 48 MG tablet Take 48 mg by mouth daily.   05/10/2021   loperamide (IMODIUM) 2 MG capsule Take 1-2 capsules (2-4 mg total) by mouth every 6 (six) hours as needed for diarrhea or loose stools. (Patient taking differently: Take 2 mg by mouth every 6 (six) hours as needed for diarrhea or loose stools.) 30 capsule 0 unknown   loratadine (CLARITIN) 10 MG tablet Take 10 mg by mouth daily.   05/10/2021   losartan (COZAAR) 100 MG tablet Take 100 mg by mouth daily.   05/10/2021   memantine (NAMENDA) 5 MG tablet Take 5 mg by mouth daily.   05/10/2021   metFORMIN (GLUCOPHAGE) 1000 MG tablet Take 1,000 mg by mouth 2 (two) times daily with a meal.   05/10/2021   metoprolol tartrate (LOPRESSOR) 25 MG tablet Take 25 mg by mouth 2 (two) times daily.   05/10/2021 at night   naphazoline-pheniramine (VISINE-A) 0.025-0.3 % ophthalmic solution Place 1 drop into both eyes 3 (three) times daily as needed for eye irritation.      nitroGLYCERIN (NITROSTAT) 0.4 MG SL tablet Place 0.4 mg under the tongue every 5 (five) minutes as needed for chest pain.   unknown   Omega-3 Fatty Acids (ULTRA OMEGA 3 PO) Take 1-2 capsules by mouth See admin instructions. Ultra omega-3 500m77m00mg capsule 1 capsule in the morning, 2 capsules in the evening   05/10/2021   pantoprazole (PROTONIX) 20 MG tablet Take 20 mg by mouth daily.   05/10/2021   Probiotic Product (PROBIOTIC PO) Take 1 capsule by mouth daily. Probiotic 3 billion cell capsule   05/10/2021   QC  CALCIUM 500MG-D3 500-200 MG-UNIT TABS Take 1 tablet by mouth daily.   05/10/2021   Saw Palmetto 450 MG CAPS Take 450 mg by mouth daily.   05/10/2021   sitaGLIPtin (JANUVIA) 25 MG tablet Take 25 mg by mouth daily.   05/10/2021   sotalol (BETAPACE) 80 MG tablet Take 80 mg by mouth 2 (two) times daily.   05/10/2021   Zinc 23 MG LOZG Take 23 mg by mouth daily.   05/10/2021   metoprolol tartrate (LOPRESSOR) 50 MG tablet Take 1 tablet (50 mg total) by mouth 2 (two) times daily. (Patient not taking: No sig reported) 60 tablet 2 Not Taking  Inpatient Medications:   apixaban  5 mg Oral BID   clopidogrel  75 mg Oral Daily   fenofibrate  160 mg Oral Daily   insulin aspart  0-9 Units Subcutaneous Q4H   memantine  5 mg Oral Daily   pantoprazole  20 mg Oral Daily    Allergies:  Allergies  Allergen Reactions   Baclofen Other (See Comments)    Severe AMS, comatose, intubated   Bee Venom Anaphylaxis   Contrast Media [Iodinated Diagnostic Agents] Anaphylaxis   Iodine-131 Anaphylaxis   Metrizamide Anaphylaxis   Metronidazole Anaphylaxis   Atorvastatin Other (See Comments)    Aching in legs   Lisinopril Cough    Social History   Socioeconomic History   Marital status: Married    Spouse name: Not on file   Number of children: Not on file   Years of education: Not on file   Highest education level: Not on file  Occupational History   Not on file  Tobacco Use   Smoking status: Never   Smokeless tobacco: Never  Substance and Sexual Activity   Alcohol use: No   Drug use: Not on file   Sexual activity: Not on file  Other Topics Concern   Not on file  Social History Narrative   Not on file   Social Determinants of Health   Financial Resource Strain: Not on file  Food Insecurity: Not on file  Transportation Needs: Not on file  Physical Activity: Not on file  Stress: Not on file  Social Connections: Not on file  Intimate Partner Violence: Not on file     Family History  Problem  Relation Age of Onset   Hypertension Other      Review of Systems: All other systems reviewed and are otherwise negative except as noted above.  Physical Exam: Vitals:   05/13/21 0225 05/13/21 0424 05/13/21 0745 05/13/21 0848  BP: (!) 126/58 (!) 115/54 137/78 122/75  Pulse: (!) 127 (!) 118 97 97  Resp:   18 20  Temp:  97.9 F (36.6 C) 97.9 F (36.6 C) 97.9 F (36.6 C)  TempSrc:  Temporal Oral Oral  SpO2: 99% 98% 98% 99%  Weight:      Height:        GEN- The patient is well appearing, alert and oriented x 3 today.   HEENT: normocephalic, atraumatic; sclera clear, conjunctiva pink; hearing intact; oropharynx clear; neck supple Lungs- Clear to ausculation bilaterally, normal work of breathing.  No wheezes, rales, rhonchi Heart- Regular rate and rhythm, no murmurs, rubs or gallops GI- soft, non-tender, non-distended, bowel sounds present Extremities- no clubbing, cyanosis, or edema; DP/PT/radial pulses 2+ bilaterally MS- no significant deformity or atrophy Skin- warm and dry, no rash or lesion Psych- euthymic mood, full affect Neuro- strength and sensation are intact  Labs:   Lab Results  Component Value Date   WBC 7.5 05/12/2021   HGB 12.5 (L) 05/12/2021   HCT 37.1 (L) 05/12/2021   MCV 86.9 05/12/2021   PLT 197 05/12/2021    Recent Labs  Lab 05/12/21 1019 05/13/21 0223  NA 137 134*  K 3.6 3.1*  CL 100 105  CO2 23 19*  BUN 10 12  CREATININE 1.38* 1.57*  CALCIUM 9.1 8.8*  PROT 6.9  --   BILITOT 0.7  --   ALKPHOS 52  --   ALT 14  --   AST 23  --   GLUCOSE 149* 210*      Radiology/Studies: CT HEAD WO  CONTRAST  Result Date: 05/11/2021 CLINICAL DATA:  Fall.  Head trauma EXAM: CT HEAD WITHOUT CONTRAST CT CERVICAL SPINE WITHOUT CONTRAST TECHNIQUE: Multidetector CT imaging of the head and cervical spine was performed following the standard protocol without intravenous contrast. Multiplanar CT image reconstructions of the cervical spine were also generated.  COMPARISON:  07/23/2019 FINDINGS: CT HEAD FINDINGS Brain: Generalized atrophy. Moderate white matter hypodensity bilaterally, unchanged. Mild ventricular enlargement due to atrophy, unchanged. Negative for acute infarct, hemorrhage, mass Vascular: Negative for hyperdense vessel Skull: Negative Sinuses/Orbits: Mild mucosal edema paranasal sinuses. Bilateral cataract extraction. Other: None CT CERVICAL SPINE FINDINGS Alignment: Mild anterolisthesis C4-5 Skull base and vertebrae: Negative for fracture Soft tissues and spinal canal: Negative Disc levels: Multilevel disc and facet degeneration. Foraminal stenosis bilaterally most prominent C4-5 and C5-6 due to spurring. Upper chest: Lung apices clear bilaterally. Other: None IMPRESSION: 1. No acute intracranial abnormality. Atrophy with chronic microvascular ischemic change in the white matter 2. Negative for cervical spine fracture.  Multilevel spondylosis. Electronically Signed   By: Franchot Gallo M.D.   On: 05/11/2021 15:07   CT CERVICAL SPINE WO CONTRAST  Result Date: 05/11/2021 CLINICAL DATA:  Fall.  Head trauma EXAM: CT HEAD WITHOUT CONTRAST CT CERVICAL SPINE WITHOUT CONTRAST TECHNIQUE: Multidetector CT imaging of the head and cervical spine was performed following the standard protocol without intravenous contrast. Multiplanar CT image reconstructions of the cervical spine were also generated. COMPARISON:  07/23/2019 FINDINGS: CT HEAD FINDINGS Brain: Generalized atrophy. Moderate white matter hypodensity bilaterally, unchanged. Mild ventricular enlargement due to atrophy, unchanged. Negative for acute infarct, hemorrhage, mass Vascular: Negative for hyperdense vessel Skull: Negative Sinuses/Orbits: Mild mucosal edema paranasal sinuses. Bilateral cataract extraction. Other: None CT CERVICAL SPINE FINDINGS Alignment: Mild anterolisthesis C4-5 Skull base and vertebrae: Negative for fracture Soft tissues and spinal canal: Negative Disc levels: Multilevel disc and  facet degeneration. Foraminal stenosis bilaterally most prominent C4-5 and C5-6 due to spurring. Upper chest: Lung apices clear bilaterally. Other: None IMPRESSION: 1. No acute intracranial abnormality. Atrophy with chronic microvascular ischemic change in the white matter 2. Negative for cervical spine fracture.  Multilevel spondylosis. Electronically Signed   By: Franchot Gallo M.D.   On: 05/11/2021 15:07   MR ANGIO HEAD WO CONTRAST  Result Date: 05/12/2021 CLINICAL DATA:  Neuro deficit, acute, stroke suspected. EXAM: MRA HEAD WITHOUT CONTRAST TECHNIQUE: Angiographic images of the Circle of Willis were acquired using MRA technique without intravenous contrast. COMPARISON:  No pertinent prior exam. FINDINGS: Anterior circulation: The visualized portions of the distal cervical and intracranial internal carotid arteries are widely patent with normal flow related enhancement. The bilateral anterior cerebral arteries and middle cerebral arteries are widely patent with antegrade flow without high-grade flow-limiting stenosis or proximal branch occlusion. No intracranial aneurysm within the anterior circulation. Posterior circulation: The vertebral arteries are widely patent with antegrade flow. Vertebrobasilar junction and basilar artery are widely patent with antegrade flow without evidence of basilar stenosis or aneurysm. Mild stenosis of the proximal left P2/PCA segment is seen. The right posterior cerebral artery is normal. No intracranial aneurysm within the posterior circulation. Anatomic variants: Hypoplastic left A1/ACA segment. Other: None. IMPRESSION: Mild narrowing of the proximal left P2/PCA segment. Otherwise, unremarkable MR angiogram of the head. Electronically Signed   By: Pedro Earls M.D.   On: 05/12/2021 10:05   MR Brain Wo Contrast (neuro protocol)  Result Date: 05/11/2021 CLINICAL DATA:  Neuro deficit, acute stroke suspected. EXAM: MRI HEAD WITHOUT CONTRAST TECHNIQUE:  Multiplanar, multiecho pulse  sequences of the brain and surrounding structures were obtained without intravenous contrast. COMPARISON:  Same day CT head.  MRI 07/23/2019. FINDINGS: Brain: Acute infarct in the posterior limb of the left internal capsule. Trace edema without mass effect. No acute hemorrhage. No mass lesion or abnormal mass effect. Neuro extra-axial fluid collection. Moderate atrophy with ex vacuo ventricular dilation, similar to priors. Moderate patchy T2/FLAIR hyperintensities within the white matter, most likely related to chronic microvascular ischemic disease. Vascular: Major arterial flow voids maintained at the skull base. Skull and upper cervical spine: Normal marrow signal. Sinuses/Orbits: Moderate ethmoid air cell mucosal thickening with milder mucosal thickening of the left maxillary sinus, sphenoid and frontal sinuses. No air-fluid levels. Unremarkable orbits. Other: No sizable mastoid effusions. IMPRESSION: 1. Acute infarct in the posterior limb of the left internal capsule.Trace edema without mass effect. 2. Moderate atrophy and chronic microvascular ischemic disease. 3. Paranasal sinus disease, as described above. Electronically Signed   By: Margaretha Sheffield MD   On: 05/11/2021 18:03   DG Pelvis Portable  Result Date: 05/11/2021 CLINICAL DATA:  Recent fall, initial encounter EXAM: PORTABLE PELVIS 1-2 VIEWS COMPARISON:  None. FINDINGS: Pelvic ring is intact. Penile implant is noted. Mild degenerative changes of the hip joints are noted as well as the lower lumbar spine. No acute fracture or dislocation is seen. IMPRESSION: Degenerative change without acute bony abnormality. Electronically Signed   By: Inez Catalina M.D.   On: 05/11/2021 14:45   DG Chest Port 1 View  Result Date: 05/11/2021 CLINICAL DATA:  Recent fall while on blood thinners EXAM: PORTABLE CHEST 1 VIEW COMPARISON:  05/10/2021 FINDINGS: Cardiac shadow is stable. Lungs are well aerated bilaterally. No focal  infiltrate or sizable effusion is seen. Stable scarring in the left base is noted. No bony abnormality is noted. IMPRESSION: Stable left basilar scarring.  No acute abnormality seen. Electronically Signed   By: Inez Catalina M.D.   On: 05/11/2021 14:45   ECHOCARDIOGRAM COMPLETE  Result Date: 05/12/2021    ECHOCARDIOGRAM REPORT   Patient Name:   LYN DEEMER Date of Exam: 05/12/2021 Medical Rec #:  [id number]       Height:       70.0 in Accession #:    [id number]      Weight:       205.0 lb Date of Birth:  08/27/1935       BSA:          2.109 m Patient Age:    35 years        BP:           197/95 mmHg Patient Gender: M               HR:           86 bpm. Exam Location:  Inpatient Procedure: 2D Echo Indications:    stroke  History:        Patient has no prior history of Echocardiogram examinations.                 CAD, chronic kidney disease, Arrythmias:Atrial Fibrillation;                 Risk Factors:Diabetes, Dyslipidemia, Hypertension and Sleep                 Apnea.  Sonographer:    Johny Chess Referring Phys: [place]  1. Left ventricular ejection fraction, by estimation, is 60 to 65%. The left ventricle has normal  function. The left ventricle has no regional wall motion abnormalities. Indeterminate diastolic filling due to E-A fusion.  2. Right ventricular systolic function is normal. The right ventricular size is normal.  3. The mitral valve is normal in structure. No evidence of mitral valve regurgitation. No evidence of mitral stenosis.  4. The aortic valve is normal in structure. There is mild calcification of the aortic valve. There is moderate thickening of the aortic valve. Aortic valve regurgitation is not visualized. Mild to moderate aortic valve sclerosis/calcification is present, without any evidence of aortic stenosis.  5. The inferior vena cava is normal in size with greater than 50% respiratory variability, suggesting right atrial pressure of 3 mmHg. FINDINGS   Left Ventricle: Left ventricular ejection fraction, by estimation, is 60 to 65%. The left ventricle has normal function. The left ventricle has no regional wall motion abnormalities. The left ventricular internal cavity size was normal in size. There is  no left ventricular hypertrophy. Indeterminate diastolic filling due to E-A fusion. Right Ventricle: The right ventricular size is normal. No increase in right ventricular wall thickness. Right ventricular systolic function is normal. Left Atrium: Left atrial size was normal in size. Right Atrium: Right atrial size was normal in size. Pericardium: There is no evidence of pericardial effusion. Mitral Valve: The mitral valve is normal in structure. Mild to moderate mitral annular calcification. No evidence of mitral valve regurgitation. No evidence of mitral valve stenosis. Tricuspid Valve: The tricuspid valve is normal in structure. Tricuspid valve regurgitation is not demonstrated. No evidence of tricuspid stenosis. Aortic Valve: The aortic valve is normal in structure. There is mild calcification of the aortic valve. There is moderate thickening of the aortic valve. Aortic valve regurgitation is not visualized. Mild to moderate aortic valve sclerosis/calcification is present, without any evidence of aortic stenosis. Pulmonic Valve: The pulmonic valve was normal in structure. Pulmonic valve regurgitation is not visualized. No evidence of pulmonic stenosis. Aorta: The aortic root is normal in size and structure. Venous: The inferior vena cava is normal in size with greater than 50% respiratory variability, suggesting right atrial pressure of 3 mmHg. IAS/Shunts: No atrial level shunt detected by color flow Doppler.  LEFT VENTRICLE PLAX 2D LVIDd:         3.30 cm Diastology LVIDs:         2.30 cm LV e' medial:  4.68 cm/s LV PW:         1.10 cm LV e' lateral: 7.29 cm/s LV IVS:        1.10 cm  RIGHT VENTRICLE RV S prime:     11.90 cm/s TAPSE (M-mode): 1.8 cm LEFT ATRIUM              Index       RIGHT ATRIUM          Index LA diam:        3.50 cm 1.66 cm/m  RA Area:     9.77 cm LA Vol (A2C):   38.2 ml 18.11 ml/m RA Volume:   18.20 ml 8.63 ml/m LA Vol (A4C):   55.5 ml 26.31 ml/m LA Biplane Vol: 50.1 ml 23.75 ml/m  AORTIC VALVE LVOT Vmax:   77.10 cm/s LVOT Vmean:  49.400 cm/s LVOT VTI:    0.144 m  AORTA Ao Asc diam: 2.90 cm  SHUNTS Systemic VTI: 0.14 m Dani Gobble Croitoru MD Electronically signed by Sanda  MD Signature Date/Time: 05/12/2021/3:19:09 PM    Final    VAS US CAROTID  Result  Date: 05/12/2021 Carotid Arterial Duplex Study Patient Name:  CHASYN CINQUE  Date of Exam:   05/12/2021 Medical Rec #: [id number]        Accession #:    [id number] Date of Birth: Apr 13, 1935        Patient Gender: M Patient Age:   54Y Exam Location:  [facility] Procedure:      VAS US CAROTID Referring Phys: [id number] ANASTASSIA DOUTOVA --------------------------------------------------------------------------------  Indications:       Right facial droop, aphasia, ride side drift. Risk Factors:      Hypertension, hyperlipidemia, Diabetes, no history of                    smoking, prior CVA. Other Factors:     Afib, CKD3. Comparison Study:  No previous exams Performing Technologist: Jody Hill RVT, RDMS  Examination Guidelines: A complete evaluation includes B-mode imaging, spectral Doppler, color Doppler, and power Doppler as needed of all accessible portions of each vessel. Bilateral testing is considered an integral part of a complete examination. Limited examinations for reoccurring indications may be performed as noted.  Right Carotid Findings: +----------+-------+-------+--------+------------------------+-----------------+           PSV    EDV    StenosisPlaque Description      Comments                    cm/s   cm/s                                                     +----------+-------+-------+--------+------------------------+-----------------+ CCA Prox  48     11                                      intimal                                                                   thickening        +----------+-------+-------+--------+------------------------+-----------------+ CCA Distal60     13             smooth, calcific and    tortuous                                          focal                                     +----------+-------+-------+--------+------------------------+-----------------+ ICA Prox  60     18     1-39%   heterogenous                              +----------+-------+-------+--------+------------------------+-----------------+ ICA Distal83     30                                                       +----------+-------+-------+--------+------------------------+-----------------+  ECA       151    0              calcific and                                                              heterogenous                              +----------+-------+-------+--------+------------------------+-----------------+ +----------+--------+-------+--------+-------------------+           PSV cm/sEDV cmsDescribeArm Pressure (mmHG) +----------+--------+-------+--------+-------------------+ Subclavian203            Stenotic                    +----------+--------+-------+--------+-------------------+ +---------+--------+--+--------+--+---------+ VertebralPSV cm/s70EDV cm/s13Antegrade +---------+--------+--+--------+--+---------+  Left Carotid Findings: +----------+-------+-------+--------+------------------------+-----------------+           PSV    EDV    StenosisPlaque Description      Comments                    cm/s   cm/s                                                     +----------+-------+-------+--------+------------------------+-----------------+ CCA Prox  60     10                                     intimal                                                                    thickening        +----------+-------+-------+--------+------------------------+-----------------+ CCA Distal67     14             calcific, focal and     intimal                                           smooth                  thickening        +----------+-------+-------+--------+------------------------+-----------------+ ICA Prox  92     19     1-39%   calcific and smooth                       +----------+-------+-------+--------+------------------------+-----------------+ ICA Distal73     23                                                       +----------+-------+-------+--------+------------------------+-----------------+  ECA       310    0      >50%    heterogenous and                                                          calcific                                  +----------+-------+-------+--------+------------------------+-----------------+ +----------+--------+--------+----------------+-------------------+           PSV cm/sEDV cm/sDescribe        Arm Pressure (mmHG) +----------+--------+--------+----------------+-------------------+ [id number]             Multiphasic, WNL                    +----------+--------+--------+----------------+-------------------+ +---------+--------+--+--------+--+---------+ VertebralPSV cm/s38EDV cm/s12Antegrade +---------+--------+--+--------+--+---------+   Summary: Right Carotid: Velocities in the right ICA are consistent with a 1-39% stenosis.                Non-hemodynamically significant plaque <50% noted in the CCA. The                ECA appears <50% stenosed. Left Carotid: Non-hemodynamically significant plaque <50% noted in the CCA. The               ECA appears >50% stenosed. Vertebrals:  Bilateral vertebral arteries demonstrate antegrade flow. Subclavians: Right subclavian artery was stenotic. Normal flow hemodynamics were              seen in the left subclavian artery. *See table(s) above for  measurements and observations.  Electronically signed by Deitra Mayo MD on 05/12/2021 at 2:55:24 PM.    Final     EKG:on arrival shows NSR at 74 bpm, PR interval 198 ms, QRS < 100 ms (personally reviewed)  TELEMETRY: Currently NSR in 90s. Overnight AF as high as 140s. Pause appears to have underlying AF. Does not appear to be post conversion.  (personally reviewed)  Imaging:  05/11/21 CT Head WO IV Contrast 1. No acute intracranial abnormality. Atrophy with chronic microvascular ischemic change in the white matter 2. Negative for cervical spine fracture.  Multilevel spondylosis.   05/12/21 MR Angio Head WO Contrast Mild narrowing of the proximal left P2/PCA segment. Otherwise, unremarkable MR angiogram of the head.   05/11/21 MRI Brain WO IV Contrast 1. Acute infarct in the posterior limb of the left internal capsule.Trace edema without mass effect. 2. Moderate atrophy and chronic microvascular ischemic disease. 3. Paranasal sinus disease, as described above.  05/12/21 Carotid US  Right Carotid: Velocities in the right ICA are consistent with a 1-39%  stenosis. Non-hemodynamically significant plaque <50% noted in the  CCA. The ECA appears <50% stenosed.   Left Carotid: Non-hemodynamically significant plaque <50% noted in the  CCA. The ECA appears >50% stenosed.  05/12/21 Echocardiogram Complete   1. Left ventricular ejection fraction, by estimation, is 60 to 65%. The left ventricle has normal function. The left ventricle has no regional wall motion abnormalities. Indeterminate diastolic filling due to E-A fusion.   2. Right ventricular systolic function is normal. The right ventricular size is normal.   3. The mitral valve is normal in structure. No evidence of mitral valve regurgitation. No evidence of mitral stenosis.  4. The aortic valve is normal in structure. There is mild calcification of the aortic valve. There is moderate thickening of the aortic valve. Aortic valve  regurgitation is not visualized. Mild to moderate aortic valve sclerosis/calcification is present, without any evidence of aortic stenosis.   5. The inferior vena cava is normal in size with greater than 50% respiratory variability, suggesting right atrial pressure of 3 mmHg.   DEVICE HISTORY:  Wore a monitor in 06/2019 after syncope which showed " no bradycardia" per notes. Son states this syncope was in the setting of an admission where he was combative and received sedating meds.    Assessment/Plan: 1.  Pause Nocturnal, in the setting of heavy snoring, untreated sleep apnea, and about 2 hours after received IV lopressor and ? Increased vagal tone in setting of CVA History of syncope x 1; per son in setting of agitation -> sedating meds.  Monitor in 2020 without bradycardia Suspect multifactorial in setting of getting IV lopressor, untreated sleep apnea, and ? Increased vagal tone in setting of stroke.  His son is especially worried about the arm restrictions that would come with a pacemaker if indicate. Pt requires pulling with both arms to get out of the chair or off a toilet. He thinks that even with constant re-direction the patient would not be able to adhere to arm restrictions.  2. 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  Continue Eliquis, plavix, and fenofibrate. CHA2DS2/VASc of at least 7.  On going general and R sided deficits. PT recommended SNF (in ALF PTA) Could potentially stop plavix from Dr. Erlinda Hong standpoint. We will also discuss this with his primary cardiologist.  3. Paroxysmal atrial fibrillation Managed chronically by Dr. Gabrielle Dare in [facility] on sotalol, toprol, and eliquis  QTc stable on arrival, presumably on his sotalol as prescribed.  AF breakthrough likely in setting of acute illness, holding sotalol, and electrolyte abnormalities.  ? If sotalol best option given dementia and marked hypokalemia on admission. Ultimately would defer this to his  primary cardiologist.   4. Hypokalemia/ Magnesemia K 3.1 this am and Mg 1.7. He received 80 meq total of K. But only 1 g of Mg.  Will give additional magnesium and recheck BMET.  Goal is to Keep K > 4.0 and Mg > 2.0   It is not clear that he would benefit from pacing. No history of unprompted syncope. AF breakthrough likely in setting of acute issues including electrolyte abnormalities along with holding his sotalol. Pause nocturnal and confounded by acute issues and untreated sleep apnea as above.   For questions or updates, please contact [place] Please consult [email or web address] for contact info under Cardiology/STEMI.  Jacalyn Lefevre, PA-C  05/13/2021 10:45 AM  Bradycardia-nocturnal  Sleep apnea-not treated  Syncope-presumed orthostatic  CVA-acute  Hypokalemia  Sotalol for atrial fibrillation  Atrial fibrillation-paroxysmal  Dementia-mild  Coronary artery disease with prior stenting  The patient presents with a stroke.  While being monitored, he had a nocturnal 7-second pause.  He has a history of syncope a couple of years ago.  He had been put on some new medications.  He stood up and lost consciousness.  Orthostatic vital signs in the emergency room that day were interrupted upon standing presumably supporting this diagnosis.  No abrupt onset offset syncope that is consistent with intermittent profound bradycardia.  The pause occurred in the context of ongoing atrial fibrillation most most consistent with intermittent complete heart block  Hence, no indication for  pacing with a nocturnal pause presumably related to his sleep apnea which is untreated.  He is on Plavix in addition to anticoagulation given his coronary artery disease.  Reached out to his primary cardiologist who recommended ongoing antiplatelet therapy.  He had offered the idea of switching him to ticagrelor with which I feel uncomfortable.  We will leave him on Plavix.  He has a  history of recurrent hypokalemia.  This markedly increases the risk for proarrhythmia associated with the sotalol therapy.  His sotalol dose is relatively low.  His atrial fibrillation is recurring and largely asymptomatic as best as I can tell not withstanding the fact that it is associate with a relatively rapid rate.  He has some degree of modest bradycardia although this has not been significant.  Hence, we will discontinue the sotalol because of risk and increase his metoprolol from 25--50 mg of succinate daily.  Further up titration of his metoprolol is likely going to be appropriate.  From our point of view he can be discharged to [facility]  Thank you for the consult   I discussed this specifically with hospitalists

## 2021-05-14 LAB — BASIC METABOLIC PANEL
Anion gap: 10 (ref 5–15)
BUN: 13 mg/dL (ref 8–23)
CO2: 22 mmol/L (ref 22–32)
Calcium: 8.6 mg/dL — ABNORMAL LOW (ref 8.9–10.3)
Chloride: 103 mmol/L (ref 98–111)
Creatinine, Ser: 1.64 mg/dL — ABNORMAL HIGH (ref 0.61–1.24)
GFR, Estimated: 41 mL/min — ABNORMAL LOW (ref >60)
Glucose, Bld: 175 mg/dL — ABNORMAL HIGH (ref 70–99)
Potassium: 4.2 mmol/L (ref 3.5–5.1)
Sodium: 135 mmol/L (ref 135–145)

## 2021-05-14 LAB — LDL CHOLESTEROL, DIRECT: Direct LDL: 57 mg/dL (ref 0–99)

## 2021-05-14 LAB — GLUCOSE, CAPILLARY
Glucose-Capillary: 171 mg/dL — ABNORMAL HIGH (ref 70–99)
Glucose-Capillary: 164 mg/dL — ABNORMAL HIGH (ref 70–99)
Glucose-Capillary: 174 mg/dL — ABNORMAL HIGH (ref 70–99)
Glucose-Capillary: 160 mg/dL — ABNORMAL HIGH (ref 70–99)

## 2021-05-14 LAB — MAGNESIUM: Magnesium: 2.3 mg/dL (ref 1.7–2.4)

## 2021-05-14 MED ORDER — SODIUM CHLORIDE 0.9 % IV SOLN: INTRAVENOUS | Status: AC

## 2021-05-14 NOTE — Progress Notes (Addendum)
PROGRESS NOTE  Duane [name] BTD:[id number] DOB: [birth year] DOA: 05/11/2021 PCP: [name], MD  Brief narrative: 86 year old male with past medical history of diabetes mellitus type 2, stage III chronic kidney disease, obstructive sleep apnea, hypertension and atrial fibrillation on Eliquis, presented to the emergency room on 6/27 with slurred speech, right facial droop and pronator drift.  Patient had reportedly been taking medications including Eliquis and Plavix.  CT scan of head unremarkable, but MRI noted acute infarct in the posterior limb of the left internal capsule and patient admitted for stroke work-up to the hospitalist service.  MRA of the head noted mild narrowing of left P2/PCA segment.  Carotid Dopplers noted less than 50% stenosis of bilateral ICA and left ECA greater than 50% stenosis.  Echocardiogram done unremarkable.  Seen by PT and OT who are recommending skilled nursing.  EP cardiology consulted on 6/29 for pauses >7 seconds noted on telemetry overnight.   Assessment/Plan: Principal Problem:  Acute CVA [facility]): Neurology/stroke MD consulted and assisted with evaluation and management.  Has completed stroke evaluation.  Diagnosed with left internal capsule stroke.  MRI brain showed a left internal capsule stroke.  MRA head showed moderate narrowing of the proximal left P2 PCA segment.  2D echo with LVEF 60-65%.  Carotid ultrasound with right ICA 1-39% stenosis and left ECA >50% stenosed.  LDL 60.9 and A1c 8.6.  Stroke MD suspecting that the internal capsule stroke is due to small vessel disease.  Prior to admission, patient on Eliquis for A. fib and on Plavix for CAD.  Due to creatinine greater than 1.5, Eliquis has been decreased to 2.5 twice daily but if creatinine improves again to less than 1.5, may consider increasing back to 5 mg twice daily.  Although neurology indicates no need for Plavix, EP Cardiology discussed with patient's primary cardiologist who recommends  continuing Plavix for cardiac indications.  Therapies evaluated and recommend SNF.  Active Problems:   Essential hypertension: Permissive hypertension duration now completed.  As discussed with EP cardiology, discontinue sotalol indefinitely but resumed and increase home metoprolol to double the dose i.e. 50 mg twice daily.  BP mildly uncontrolled.  Consider amlodipine 2.5 Mg daily.    PAF (paroxysmal atrial fibrillation) (HCC) acute CVA does not look to be related to atrial fibrillation.  Overnight 6/28, had brief episode of A. fib with RVR and received a dose of IV metoprolol.  PTA, patient was on sotalol, metoprolol and Eliquis.  Sotalol has been held since admission.  As per EP cardiology, A. fib breakthrough felt to be due to acute illness, holding sotalol and electrolyte abnormalities.  As per my discussion with Dr. Caryl Comes, discontinued sotalol indefinitely due to severe hypokalemia on admission/recurrent issue, resume and increased metoprolol to 50 mg twice daily and continue Eliquis.  Outpatient follow-up with Dr. Gabrielle Dare, primary cardiologist in [facility], [place].  CHA2DS2-VASc score of at least 7.  Remains in sinus rhythm.  Nocturnal pause: Noted 7.78-second pause on telemetry on 05/13/2021 at approximately 3:41 AM.  Asymptomatic.  EP cardiology consultation appreciated.  I discussed with Dr. Caryl Comes.  Suspected due to OSA, had received IV Lopressor approximately 2 hours prior and?  Increased vagal tone in the setting of CVA.  EP recommends discontinuing sotalol, no pacemaker, resuming metoprolol at increased dose.  No further pauses noted.  Hypokalemia/hypomagnesemia Replaced.  Attempt to keep potassium >4 and magnesium >2.    Diabetes mellitus type 2, controlled ([place]): Added Lantus 5 units at bedtime with better control.  Continue SSI.  A1c 8.6 suggests poor outpatient control.  Acute kidney injury complicating CKD (chronic kidney disease), stage III b(HCC): Baseline creatinine as in Care  Everywhere appears to be in the 1.3-1.4 range in October 2021.  Creatinine has now increased to 1.7.  Renal ultrasound negative for hydronephrosis.  Likely due to dehydration.  Patient volunteers to drinking a lot of water at home.  Bladder scan: 44 mL.  Creatinine marginally better: 1.6.  Increase IV fluids 200 mL/h.  Follow BMP in AM.    Hyperlipidemia/hypertriglyceridemia: Triglycerides 619.  Neurology has increased the dose of fenofibrate to 160 mg daily.  Follow fasting lipid panels in 4 to 6 weeks as outpatient.    Overweight (BMI 25.0-29.9): Patient meets criteria BMI greater than 25.  OSA: Unsure if patient is on CPAP at home.  Recommend outpatient testing if not done already.  Anemia, suspect due to chronic disease/chronic kidney disease versus other etiologies: Stable.  Outpatient follow-up  CHF was apparently documented in H&P. CHF is ruled out.   Code Status: Full code Family Communication: None at bedside. Disposition Plan: DC to SNF, hopefully 7/1 pending improvement in AKI.   Consultants: Neuro  EP cardiology  Procedures: Echocardiogram: preserved ejection fraction,   Carotid dopplers: no sig intracranial stenosis  Antimicrobials: None   DVT prophylaxis:  Eliquis  Subjective: Says he did not sleep well at night because he was thirsty.  Reports drinking large amounts of water at home.  No other complaints reported.  Patient reported to RN that he is fearful of swallowing food and has not eaten much.  Requested ST consultation for swallow eval   Objective: Vitals:   05/14/21 0747 05/14/21 1117  BP: (!) 173/78 (!) 164/81  Pulse: 83 75  Resp: 18 18  Temp: 97.6 F (36.4 C) 97.9 F (36.6 C)  SpO2: 97% 98%    Intake/Output Summary (Last 24 hours) at 05/14/2021 1354 Last data filed at 05/14/2021 1121 Gross per 24 hour  Intake --  Output 1550 ml  Net -1550 ml   Filed Weights   05/11/21 1400 05/11/21 2049  Weight: 89.8 kg 93 kg   Body mass index is 29.42  kg/m.  Exam:  General: Elderly male, moderately built and nourished lying comfortably supine in bed without distress.  Oral mucosa dry.  Reduced skin turgor. Cardiovascular: S1 and S2 heard, RRR.  No JVD, murmurs or pedal edema.  Telemetry personally reviewed: Sinus rhythm.  No further pauses or A. fib. Respiratory: Clear to auscultation bilaterally.  No increased work of breathing. Abdomen: soft, NT, ND, +BS  Musculoskeletal: No clubbing cyanosis or lower extremity edema. Psychiatry: Appropriate, no evidence of psychoses Neurology: Alert and oriented x3.  Decreased left nasolabial fold.  Dysarthria present.  Right upper extremity grade 4+ by 5 power.  Others grade 5 x 5 power.  Left shoulder with good range of movements and not painful.  No change in stable.   Data Reviewed: CBC: Recent Labs  Lab 05/11/21 1342 05/11/21 1356 05/11/21 1415 05/11/21 1453 05/12/21 1019  WBC 9.1  --  8.4  --  7.5  NEUTROABS 5.0  --   --   --   --   HGB 11.6* 11.6* 12.5* 11.9* 12.5*  HCT 36.0* 34.0* 37.2* 35.0* 37.1*  MCV 91.8  --  89.6  --  86.9  PLT 206  --  192  --  209   Basic Metabolic Panel: Recent Labs  Lab 05/11/21 1415 05/11/21 1453 05/12/21 1019 05/13/21 0223  05/13/21 1329 05/14/21 0431  NA 137 138 137 134* 133* 135  K 4.5 4.1 3.6 3.1* 4.2 4.2  CL 103 104 100 105 104 103  CO2 22  --  23 19* 20* 22  GLUCOSE 226* 214* 149* 210* 181* 175*  BUN 15 14 10 12 13 13   CREATININE 1.64* 1.60* 1.38* 1.57* 1.71* 1.64*  CALCIUM 9.3  --  9.1 8.8* 8.8* 8.6*  MG  --   --   --  1.7  --  2.3   GFR: Estimated Creatinine Clearance: 38.4 mL/min (A) (by C-G formula based on SCr of 1.64 mg/dL (H)). Liver Function Tests: Recent Labs  Lab 05/11/21 1342 05/11/21 1415 05/12/21 1019  AST 25 30 23   ALT 16 15 14   ALKPHOS 49 48 52  BILITOT 0.6 0.7 0.7  PROT 6.5 6.8 6.9  ALBUMIN 3.3* 3.5 3.4*    Coagulation Profile: Recent Labs  Lab 05/11/21 1342 05/11/21 1415  INR 1.2 1.1     HbA1C: Recent Labs    05/12/21 1019  HGBA1C 8.6*   CBG: Recent Labs  Lab 05/13/21 1155 05/13/21 1649 05/13/21 2149 05/14/21 0642 05/14/21 1210  GLUCAP 224* 150* 187* 171* 164*   Lipid Profile: Recent Labs    05/12/21 1019 05/13/21 0223  CHOL 207* 192  HDL 30* 26*  LDLCALC UNABLE TO CALCULATE IF TRIGLYCERIDE OVER 400 mg/dL UNABLE TO CALCULATE IF TRIGLYCERIDE OVER 400 mg/dL  TRIG 676* 619*  CHOLHDL 6.9 7.4  LDLDIRECT 57.0 60.9    Urine analysis:    Component Value Date/Time   COLORURINE YELLOW 05/11/2021 1610   APPEARANCEUR CLEAR 05/11/2021 1610   LABSPEC 1.011 05/11/2021 1610   PHURINE 6.0 05/11/2021 1610   GLUCOSEU >=500 (A) 05/11/2021 1610   HGBUR NEGATIVE 05/11/2021 1610   BILIRUBINUR NEGATIVE 05/11/2021 1610   KETONESUR NEGATIVE 05/11/2021 1610   PROTEINUR NEGATIVE 05/11/2021 1610   NITRITE NEGATIVE 05/11/2021 1610   LEUKOCYTESUR NEGATIVE 05/11/2021 1610    Recent Results (from the past 240 hour(s))  Resp Panel by RT-PCR (Flu A&B, Covid) Nasopharyngeal Swab     Status: None   Collection Time: 05/11/21  2:50 PM   Specimen: Nasopharyngeal Swab; Nasopharyngeal(NP) swabs in vial transport medium  Result Value Ref Range Status   SARS Coronavirus 2 by RT PCR NEGATIVE NEGATIVE Final    Comment: (NOTE) SARS-CoV-2 target nucleic acids are NOT DETECTED.  The SARS-CoV-2 RNA is generally detectable in upper respiratory specimens during the acute phase of infection. The lowest concentration of SARS-CoV-2 viral copies this assay can detect is 138 copies/mL. A negative result does not preclude SARS-Cov-2 infection and should not be used as the sole basis for treatment or other patient management decisions. A negative result may occur with  improper specimen collection/handling, submission of specimen other than nasopharyngeal swab, presence of viral mutation(s) within the areas targeted by this assay, and inadequate number of viral copies(<138 copies/mL). A  negative result must be combined with clinical observations, patient history, and epidemiological information. The expected result is Negative.  Fact Sheet for Patients:  EntrepreneurPulse.com.au  Fact Sheet for Healthcare Providers:  IncredibleEmployment.be  This test is no t yet approved or cleared by the [place] FDA and  has been authorized for detection and/or diagnosis of SARS-CoV-2 by FDA under an Emergency Use Authorization (EUA). This EUA will remain  in effect (meaning this test can be used) for the duration of the COVID-19 declaration under Section 564(b)(1) of the Act, 21 U.S.C.section 360bbb-3(b)(1), unless the authorization  is terminated  or revoked sooner.       Influenza A by PCR NEGATIVE NEGATIVE Final   Influenza B by PCR NEGATIVE NEGATIVE Final    Comment: (NOTE) The Xpert Xpress SARS-CoV-2/FLU/RSV plus assay is intended as an aid in the diagnosis of influenza from Nasopharyngeal swab specimens and should not be used as a sole basis for treatment. Nasal washings and aspirates are unacceptable for Xpert Xpress SARS-CoV-2/FLU/RSV testing.  Fact Sheet for Patients: EntrepreneurPulse.com.au  Fact Sheet for Healthcare Providers: IncredibleEmployment.be  This test is not yet approved or cleared by the [place] FDA and has been authorized for detection and/or diagnosis of SARS-CoV-2 by FDA under an Emergency Use Authorization (EUA). This EUA will remain in effect (meaning this test can be used) for the duration of the COVID-19 declaration under Section 564(b)(1) of the Act, 21 U.S.C. section 360bbb-3(b)(1), unless the authorization is terminated or revoked.  Performed at [facility], [place] [address] [place], [place] [address]   MRSA Next Gen by PCR, Nasal     Status: None   Collection Time: 05/12/21  2:58 AM   Specimen: Nasal Mucosa; Nasal Swab  Result Value Ref Range  Status   MRSA by PCR Next Gen NOT DETECTED NOT DETECTED Final    Comment: (NOTE) The GeneXpert MRSA Assay (FDA approved for NASAL specimens only), is one component of a comprehensive MRSA colonization surveillance program. It is not intended to diagnose MRSA infection nor to guide or monitor treatment for MRSA infections. Test performance is not FDA approved in patients less than 49 years old. Performed at [facility], [place] [address] [place], [place] [address]   Resp Panel by RT-PCR (Flu A&B, Covid) Nasopharyngeal Swab     Status: None   Collection Time: 05/13/21 11:11 AM   Specimen: Nasopharyngeal Swab; Nasopharyngeal(NP) swabs in vial transport medium  Result Value Ref Range Status   SARS Coronavirus 2 by RT PCR NEGATIVE NEGATIVE Final    Comment: (NOTE) SARS-CoV-2 target nucleic acids are NOT DETECTED.  The SARS-CoV-2 RNA is generally detectable in upper respiratory specimens during the acute phase of infection. The lowest concentration of SARS-CoV-2 viral copies this assay can detect is 138 copies/mL. A negative result does not preclude SARS-Cov-2 infection and should not be used as the sole basis for treatment or other patient management decisions. A negative result may occur with  improper specimen collection/handling, submission of specimen other than nasopharyngeal swab, presence of viral mutation(s) within the areas targeted by this assay, and inadequate number of viral copies(<138 copies/mL). A negative result must be combined with clinical observations, patient history, and epidemiological information. The expected result is Negative.  Fact Sheet for Patients:  EntrepreneurPulse.com.au  Fact Sheet for Healthcare Providers:  IncredibleEmployment.be  This test is no t yet approved or cleared by the [place] FDA and  has been authorized for detection and/or diagnosis of SARS-CoV-2 by FDA under an Emergency Use  Authorization (EUA). This EUA will remain  in effect (meaning this test can be used) for the duration of the COVID-19 declaration under Section 564(b)(1) of the Act, 21 U.S.C.section 360bbb-3(b)(1), unless the authorization is terminated  or revoked sooner.       Influenza A by PCR NEGATIVE NEGATIVE Final   Influenza B by PCR NEGATIVE NEGATIVE Final    Comment: (NOTE) The Xpert Xpress SARS-CoV-2/FLU/RSV plus assay is intended as an aid in the diagnosis of influenza from Nasopharyngeal swab specimens and should not be used as  a sole basis for treatment. Nasal washings and aspirates are unacceptable for Xpert Xpress SARS-CoV-2/FLU/RSV testing.  Fact Sheet for Patients: EntrepreneurPulse.com.au  Fact Sheet for Healthcare Providers: IncredibleEmployment.be  This test is not yet approved or cleared by the [place] FDA and has been authorized for detection and/or diagnosis of SARS-CoV-2 by FDA under an Emergency Use Authorization (EUA). This EUA will remain in effect (meaning this test can be used) for the duration of the COVID-19 declaration under Section 564(b)(1) of the Act, 21 U.S.C. section 360bbb-3(b)(1), unless the authorization is terminated or revoked.  Performed at [facility], [place] [address] [place], [place] [address]       Studies: US RENAL  Result Date: 05/13/2021 CLINICAL DATA:  Acute kidney injury EXAM: RENAL / URINARY TRACT ULTRASOUND COMPLETE COMPARISON:  Ultrasound 09/09/2016, CT 08/19/2020 FINDINGS: Right Kidney: Renal measurements: 11.1 x 5.5 x 4.1 cm = volume: 130.8 mL. Echogenicity within normal limits. No mass or hydronephrosis visualized. Left Kidney: Renal measurements: 10 x 5.1 x 4.2 cm = volume: 171.8 mL. Echogenicity within normal limits. No mass or hydronephrosis visualized. Bladder: Appears normal for degree of bladder distention. Other: None. IMPRESSION: Negative renal ultrasound Electronically Signed    By: Donavan Foil M.D.   On: 05/13/2021 21:43    Scheduled Meds:  apixaban  2.5 mg Oral BID   clopidogrel  75 mg Oral Daily   fenofibrate  160 mg Oral Daily   insulin aspart  0-5 Units Subcutaneous QHS   insulin aspart  0-9 Units Subcutaneous TID WC   insulin glargine  5 Units Subcutaneous QHS   memantine  5 mg Oral Daily   metoprolol tartrate  50 mg Oral BID   pantoprazole  20 mg Oral Daily    Continuous Infusions:  sodium chloride 100 mL/hr at 05/14/21 0901     LOS: 3 days     Vernell Leep, MD, [place], [facility]. [facility]  To contact the attending provider between 7A-7P or the covering provider during after hours 7P-7A, please log into the web site [email or web address] and access using universal [place] password for that web site. If you do not have the password, please call the hospital operator.

## 2021-05-14 NOTE — Plan of Care (Signed)
  Problem: Education: Goal: Knowledge of disease or condition will improve Outcome: Progressing Goal: Knowledge of secondary prevention will improve Outcome: Progressing   

## 2021-05-14 NOTE — Therapy (Signed)
Occupational Therapy Treatment Patient Details Name: Duane [name] MRN: [id number] DOB: August 06, 1935 Today's Date: 05/14/2021    History of present illness pt is 86 y.o. male with medical history significant of DM2, CKD,  CAD, OSA, A.fib on eliquis, HTN, HLD;  Presented with right facial droop, aphasia and right pronator drift started yesterday morning Was evaluated to [facility] did no had a CT. 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   OT comments  Pt seen for OT ADL retraining session today with focus on bed mobility, grooming sitting at EOB, sit to stand transfers and taking several steps forward, back, side to side w/ RW in preparation for increased participation of toileting transfers/peri care. Pt is currently +2 min-max assist for LB self care/ADL's. Pt is progressing slowly toward goals. Plan of care remains appropriate, rec SNF Rehab.   Follow Up Recommendations  SNF;Supervision/Assistance - 24 hour     Equipment Recommendations  Other (comment) (Defer to next venue)    Recommendations for Other Services      Precautions / Restrictions Precautions Precautions: Fall Restrictions Weight Bearing Restrictions: No       Mobility Bed Mobility Overal bed mobility: Needs Assistance Bed Mobility: Supine to Sit;Sit to Supine     Supine to sit: Min assist;HOB elevated Sit to supine: Mod assist (Assist for BLE when getting back in bed to clear EOB)   General bed mobility comments: Min A for repositioning hips towards EOB. Increased time required to complete    Transfers Overall transfer level: Needs assistance Equipment used: Rolling walker (2 wheeled) Transfers: Sit to/from Stand Sit to Stand: Min assist;+2 physical assistance;+2 safety/equipment              Balance Overall balance assessment: Needs assistance Sitting-balance support: Feet  supported;Bilateral upper extremity supported Sitting balance-Leahy Scale: Fair     Standing balance support: Bilateral upper extremity supported (Static standing during ADL at EOB) Standing balance-Leahy Scale: Poor                             ADL either performed or assessed with clinical judgement   ADL Overall ADL's : Needs assistance/impaired     Grooming: Supervision/safety;Min guard;Set up;Sitting (Sitting EOB to wash face, hands, brush teeth. Increased time for tasks, Min-mod difficulty noted brushing teeth using R dominant UE due to decreased [facility])                   Toilet Transfer: Minimal assistance;+2 for safety/equipment;BSC;RW;Cueing for safety;Cueing for sequencing;+2 for physical assistance (Simulated toilet transfer sit to stand from EOB, static standing followed by taking a few steps forward and side to side and sitting at EOB again. Benefits from [facility] for safety/sequencing.)   Toileting- Clothing Manipulation and Hygiene: Maximal assistance (therapist provided Max assist for peri/anal care as he had stained bed pad) Toileting - Clothing Manipulation Details (indicate cue type and reason): Max assist             Vision       Perception     Praxis      Cognition Arousal/Alertness: Awake/alert Behavior During Therapy: WFL for tasks assessed/performed Overall Cognitive Status: History of cognitive impairments - at baseline Area of Impairment: Memory;Following commands (h/o dementia)  Memory: Decreased short-term memory;Decreased recall of precautions (h/o dementia) Following Commands: Follows one step commands consistently       General Comments: dementia at baseline, however A&O x 3. Reading calendar in room for correct date.        Exercises     Shoulder Instructions       General Comments      Pertinent Vitals/ Pain       Pain Assessment: Faces Faces Pain Scale: Hurts little more Pain Location:  Chest (RN notified and gave tylenol) Pain Descriptors / Indicators: Grimacing Pain Intervention(s): Limited activity within patient's tolerance;Monitored during session;Repositioned;RN gave pain meds during session  Home Living                                          Prior Functioning/Environment              Frequency  Min 2X/week        Progress Toward Goals  OT Goals(current goals can now be found in the care plan section)  Progress towards OT goals: Progressing toward goals  Acute Rehab OT Goals Patient Stated Goal: Go home Time For Goal Achievement: 05/26/21 Potential to Achieve Goals: Good  Plan Discharge plan remains appropriate;Frequency remains appropriate    Co-evaluation                 AM-PAC OT "6 Clicks" Daily Activity     Outcome Measure   Help from another person eating meals?: A Little Help from another person taking care of personal grooming?: A Little Help from another person toileting, which includes using toliet, bedpan, or urinal?: A Lot Help from another person bathing (including washing, rinsing, drying)?: A Lot Help from another person to put on and taking off regular upper body clothing?: A Lot Help from another person to put on and taking off regular lower body clothing?: A Lot 6 Click Score: 14    End of Session Equipment Utilized During Treatment: Gait belt;Rolling walker  OT Visit Diagnosis: Unsteadiness on feet (R26.81);Repeated falls (R29.6);Other symptoms and signs involving the nervous system (R29.898);Hemiplegia and hemiparesis Hemiplegia - Right/Left: Right Hemiplegia - dominant/non-dominant: Dominant Hemiplegia - caused by: Cerebral infarction   Activity Tolerance Patient limited by fatigue;Patient limited by pain   Patient Left in chair;with chair alarm set;with family/visitor present   Nurse Communication Mobility status;Precautions;Other (comment) (Son present asking about d/c to SNF. RN  notified.)        Time: 2297-9892 OT Time Calculation (min): 31 min  Charges: OT General Charges $OT Visit: 1 Visit OT Treatments $Self Care/Home Management : 23-37 mins   Maxime Beckner Beth Dixon, OTR/L 05/14/2021, 12:12 PM

## 2021-05-14 NOTE — Evaluation (Signed)
Clinical/Bedside Swallow Evaluation Patient Details  Name: Duane [name] MRN: [id number] Date of Birth: [birth year]  Today's Date: 05/14/2021 Time: SLP Start Time (ACUTE ONLY): 42 SLP Stop Time (ACUTE ONLY): 1505 SLP Time Calculation (min) (ACUTE ONLY): 14 min  Past Medical History:  Past Medical History:  Diagnosis Date   A-fib ([place])    Anemia    Angina pectoris ([place])    Arthritis    Carpal tunnel syndrome    Cobalamin deficiency    Coronary artery disease    Diabetes mellitus without complication (HCC)    Eczema    Hyperlipemia    Hypertension    Kidney disease, chronic, stage III (moderate, EGFR 30-59 ml/min) (HCC)    Lung infiltrate    Myocardial infarct, old    Prostatic hypertrophy    Sleep apnea, obstructive    Squamous cell cancer of lip    Past Surgical History:  Past Surgical History:  Procedure Laterality Date   CORONARY ANGIOPLASTY WITH STENT PLACEMENT     HPI:  86 yo male with right sided weakness, several falls PTA. 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  Pt PMH + for Afib, HTN, DM.  On 6/28 pt experienced Afib with RVR. Speech/language eval ordered. Pt passed [facility] swallow screen.   Assessment / Plan / Recommendation Clinical Impression  SLP assessed swallow function per request of RN and MD as pt verbalized being fearful during PO consumption with reduced PO intake. Pt continues to have right sided oral motor deficits; mild to moderate with slight right facial droop and dysarthria of speech. Pt however able to syphoon liquids via straw without difficulty and consume puree and solid PO without anterior spillage. (Pt did exhibit some reduced acurracy with self feeding, followed by OT). Pt consumed all POs without overt s/sx of aspiration; brisk swallow, clear vocal quality. Suspect reduced PO intake and earlier reported pt fear with PO, may be related to cognitive deficits and hx of dementia. Pt jokingly stated, "look at my belly, does it look like I have any  trouble eating or drinking?" Continue regular thin liquid diet. No further needs for swallowing identified.  SLP Visit Diagnosis: Dysphagia, oral phase (R13.11);Dysphagia, unspecified (R13.10)    Aspiration Risk  Mild aspiration risk    Diet Recommendation   Regular thin liquids   Medication Administration: Whole meds with liquid    Other  Recommendations Oral Care Recommendations: Oral care BID   Follow up Recommendations Skilled Nursing facility      Frequency and Duration            Prognosis Prognosis for Safe Diet Advancement: Good Barriers to Reach Goals: Cognitive deficits      Swallow Study   General Date of Onset: 05/14/21 HPI: 86 yo male with right sided weakness, several falls PTA. 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  Pt PMH + for Afib, HTN, DM.  On 6/28 pt experienced Afib with RVR. Speech/language eval ordered. Pt passed [facility] swallow screen. Type of Study: Bedside Swallow Evaluation Previous Swallow Assessment: none on file Diet Prior to this Study: Regular;Thin liquids Temperature Spikes Noted: No Respiratory Status: Room air History of Recent Intubation: No Behavior/Cognition: Alert;Cooperative;Confused Oral Cavity Assessment: Within Functional Limits Oral Care Completed by SLP: No Vision: Functional for self-feeding Self-Feeding Abilities: Able to feed self;Needs set up Patient Positioning: Upright in bed Baseline Vocal Quality: Normal Volitional Swallow: Able to elicit    Oral/Motor/Sensory Function Overall Oral Motor/Sensory Function: Moderate impairment Facial ROM: Reduced right;Suspected CN  VII (facial) dysfunction Facial Symmetry: Abnormal symmetry right;Suspected CN VII (facial) dysfunction Facial Strength: Reduced right;Suspected CN VII (facial) dysfunction Facial Sensation: Within Functional Limits Lingual ROM: Suspected CN XII (hypoglossal) dysfunction Lingual Symmetry: Within Functional Limits Lingual Strength: Reduced Velum: Within  Functional Limits Mandible: Within Functional Limits   Ice Chips Ice chips: Not tested   Thin Liquid Thin Liquid: Within functional limits Presentation: Cup;Straw    Nectar Thick Nectar Thick Liquid: Not tested   Honey Thick Honey Thick Liquid: Not tested   Puree Puree: Within functional limits Presentation: Self Fed   Solid     Solid: Within functional limits Presentation: [place], CCC-SLP Acute Rehabilitation Services   05/14/2021,3:21 PM

## 2021-05-15 LAB — GLUCOSE, CAPILLARY
Glucose-Capillary: 155 mg/dL — ABNORMAL HIGH (ref 70–99)
Glucose-Capillary: 155 mg/dL — ABNORMAL HIGH (ref 70–99)

## 2021-05-15 LAB — BASIC METABOLIC PANEL
Anion gap: 4 — ABNORMAL LOW (ref 5–15)
BUN: 12 mg/dL (ref 8–23)
CO2: 25 mmol/L (ref 22–32)
Calcium: 8.5 mg/dL — ABNORMAL LOW (ref 8.9–10.3)
Chloride: 108 mmol/L (ref 98–111)
Creatinine, Ser: 1.53 mg/dL — ABNORMAL HIGH (ref 0.61–1.24)
GFR, Estimated: 45 mL/min — ABNORMAL LOW (ref >60)
Glucose, Bld: 168 mg/dL — ABNORMAL HIGH (ref 70–99)
Potassium: 4.6 mmol/L (ref 3.5–5.1)
Sodium: 137 mmol/L (ref 135–145)

## 2021-05-15 LAB — RESP PANEL BY RT-PCR (FLU A&B, COVID) ARPGX2
Influenza A by PCR: NEGATIVE
Influenza B by PCR: NEGATIVE
SARS Coronavirus 2 by RT PCR: NEGATIVE

## 2021-05-15 MED ORDER — ACETAMINOPHEN 325 MG PO TABS: 650.0000 mg | ORAL_TABLET | Freq: Four times a day (QID) | ORAL | Status: DC | PRN

## 2021-05-15 MED ORDER — APIXABAN 2.5 MG PO TABS: 2.5000 mg | ORAL_TABLET | Freq: Two times a day (BID) | ORAL | Status: DC

## 2021-05-15 MED ORDER — AMLODIPINE BESYLATE 5 MG PO TABS: 5.0000 mg | ORAL_TABLET | Freq: Every day | ORAL | Status: DC

## 2021-05-15 MED ORDER — WHITE PETROLATUM EX OINT
TOPICAL_OINTMENT | CUTANEOUS | Status: AC
  Filled 2021-05-15: qty 28.35

## 2021-05-15 MED ORDER — METOPROLOL TARTRATE 50 MG PO TABS: 50.0000 mg | ORAL_TABLET | Freq: Two times a day (BID) | ORAL | Status: DC

## 2021-05-15 MED ORDER — LOPERAMIDE HCL 2 MG PO CAPS: 2.0000 mg | ORAL_CAPSULE | Freq: Four times a day (QID) | ORAL | Status: DC | PRN

## 2021-05-15 MED ORDER — AMLODIPINE BESYLATE 5 MG PO TABS
5.0000 mg | ORAL_TABLET | Freq: Every day | ORAL | Status: DC
  Administered 2021-05-15: 5 mg via ORAL
  Filled 2021-05-15: qty 1

## 2021-05-15 MED ORDER — INSULIN DETEMIR 100 UNIT/ML ~~LOC~~ SOLN: 5.0000 [IU] | Freq: Every day | SUBCUTANEOUS | Status: DC

## 2021-05-15 MED ORDER — FENOFIBRATE 160 MG PO TABS: 160.0000 mg | ORAL_TABLET | Freq: Every day | ORAL | Status: DC

## 2021-05-15 NOTE — TOC Transition Note (Signed)
Transition of Care [facility]) - CM/SW Discharge Note   Patient Details  Name: Duane [name] MRN: [id number] Date of Birth: Aug 29, 1935  Transition of Care [facility]) CM/SW Contact:  Geralynn Ochs, LCSW Phone Number: 05/15/2021, 1:48 PM   Clinical Narrative:   Nurse to call report to [phone], ask for [facility] 1.    Final next level of care: Skilled Nursing Facility Barriers to Discharge: Barriers Resolved   Patient Goals and CMS Choice Patient states their goals for this hospitalization and ongoing recovery are:: to get back home CMS Medicare.gov Compare Post Acute Care list provided to:: Patient Choice offered to / list presented to : Patient, Adult Children  Discharge Placement              Patient chooses bed at: [facility] Patient to be transferred to facility by: [place] Name of family member notified: Self, Angie Patient and family notified of of transfer: 05/15/21  Discharge Plan and Services     Post Acute Care Choice: [place]                               Social Determinants of Health (SDOH) Interventions     Readmission Risk Interventions No flowsheet data found.

## 2021-05-15 NOTE — NC FL2 (Signed)
[place] LEVEL OF CARE SCREENING TOOL     IDENTIFICATION  Patient Name: Duane [name] Birthdate: [birth year] Sex: male Admission Date (Current Location): 05/11/2021  [facility] [place] Number:  Herbalist and Address:  [facility] [place] [address] [place], [place] [address]      Provider Number: [id number]  Attending Physician Name and Address:  Modena Jansky, MD  Relative Name and Phone Number:       Current Level of Care: Hospital Recommended Level of Care: [place] Prior Approval Number:    Date Approved/Denied:   PASRR Number: [id number]  Discharge Plan: SNF    Current Diagnoses: Patient Active Problem List   Diagnosis Date Noted   Overweight (BMI 25.0-29.9) 05/12/2021   Stroke (cerebrum) ([place]) 05/11/2021   CKD (chronic kidney disease), stage III ([place]) 05/11/2021   Hyperlipidemia 05/11/2021   Hyponatremia 09/05/2019   Diarrhea 09/05/2019   UTI (urinary tract infection) 09/05/2019   Encephalopathy 07/23/2019   Essential hypertension 09/08/2016   PAF (paroxysmal atrial fibrillation) ([place]) 09/08/2016   Diabetes mellitus type 2, controlled ([place]) 09/08/2016   ARF (acute renal failure) ([place]) 09/08/2016   Normochromic normocytic anemia 09/08/2016   Abdominal pain 09/08/2016    Orientation RESPIRATION BLADDER Height & Weight     Self, Time, Situation, Place  Normal Incontinent, Continent (incontinent at times) Weight: 205 lb 0.4 oz (93 kg) Height:  5\' 10"  (177.8 cm)  BEHAVIORAL SYMPTOMS/MOOD NEUROLOGICAL BOWEL NUTRITION STATUS      Continent Diet (see DC summary)  AMBULATORY STATUS COMMUNICATION OF NEEDS Skin   Extensive Assist Verbally Normal                       Personal Care Assistance Level of Assistance  Bathing, Feeding, Dressing Bathing Assistance: Limited assistance Feeding assistance: Limited assistance Dressing Assistance: Limited assistance     Functional Limitations  Info  Speech     Speech Info: Impaired (dysarthria)    SPECIAL CARE FACTORS FREQUENCY  PT (By licensed PT), OT (By licensed OT)     PT Frequency: 5x/wk OT Frequency: 5x/wk            Contractures Contractures Info: Not present    Additional Factors Info  Code Status, Allergies, Insulin Sliding Scale Code Status Info: Full Allergies Info: Baclofen, Bee Venom, Contrast Media (Iodinated Diagnostic Agents), Iodine-131, Metrizamide, Metronidazole, Atorvastatin, Lisinopril   Insulin Sliding Scale Info: see DC summary       Current Medications (05/15/2021):  This is the current hospital active medication list Current Facility-Administered Medications  Medication Dose Route Frequency Provider Last Rate Last Admin   acetaminophen (TYLENOL) tablet 650 mg  650 mg Oral Q4H PRN Toy Baker, MD   650 mg at 05/15/21 0630   Or   acetaminophen (TYLENOL) 160 MG/5ML solution 650 mg  650 mg Per Tube Q4H PRN Toy Baker, MD       Or   acetaminophen (TYLENOL) suppository 650 mg  650 mg Rectal Q4H PRN Doutova, Anastassia, MD       amLODipine (NORVASC) tablet 5 mg  5 mg Oral Daily Hongalgi, Anand D, MD   5 mg at 05/15/21 0905   apixaban (ELIQUIS) tablet 2.5 mg  2.5 mg Oral BID Rosalin Hawking, MD   2.5 mg at 05/15/21 1601   clopidogrel (PLAVIX) tablet 75 mg  75 mg Oral Daily Toy Baker, MD   75 mg at 05/15/21 0905   fenofibrate  tablet 160 mg  160 mg Oral Daily Einar [place], NP   160 mg at 05/15/21 0904   insulin aspart (novoLOG) injection 0-5 Units  0-5 Units Subcutaneous QHS Hongalgi, Anand D, MD       insulin aspart (novoLOG) injection 0-9 Units  0-9 Units Subcutaneous TID WC Modena Jansky, MD   2 Units at 05/15/21 0730   insulin glargine (LANTUS) injection 5 Units  5 Units Subcutaneous QHS Modena Jansky, MD   5 Units at 05/14/21 2028   memantine [facility]) tablet 5 mg  5 mg Oral Daily Doutova, Nyoka Lint, MD   5 mg at 05/15/21 0904   metoprolol tartrate  (LOPRESSOR) tablet 50 mg  50 mg Oral BID Vernell Leep D, MD   50 mg at 05/15/21 0904   pantoprazole (PROTONIX) EC tablet 20 mg  20 mg Oral Daily Doutova, Nyoka Lint, MD   20 mg at 05/15/21 0904   senna-docusate (Senokot-S) tablet 1 tablet  1 tablet Oral QHS PRN Toy Baker, MD         Discharge Medications: Please see discharge summary for a list of discharge medications.  Relevant Imaging Results:  Relevant Lab Results:   Additional Information SS#: [id number]  Geralynn Ochs, LCSW

## 2021-05-15 NOTE — Progress Notes (Signed)
Physical Therapy Treatment Patient Details Name: Duane [name] MRN: [id number] DOB: August 14, 1935 Today's Date: 05/15/2021    History of Present Illness Pt is 86 yo male who presented from ALF on 05/11/21 with R facial droop, aphasia, and R pronator drift and 3 falls with hit to head. MRI showed acute infarct in the posterior limb of the L internal capsule. PMH: DM2, CKD,  CAD, OSA, A.fib on eliquis, HTN, HLD.    PT Comments    Patient progressing towards physical therapy goals. Patient ambulating total of 30' with minA and initially single UE support progressing to no UE support. Patient performed dynamic reaching tasks outside of BOS with min guard-minA. Continue to recommend SNF for ongoing Physical Therapy.       Follow Up Recommendations  SNF;Supervision/Assistance - 24 hour     Equipment Recommendations  None recommended by PT    Recommendations for Other Services       Precautions / Restrictions Precautions Precautions: Fall Restrictions Weight Bearing Restrictions: No    Mobility  Bed Mobility Overal bed mobility: Needs Assistance Bed Mobility: Supine to Sit;Sit to Supine     Supine to sit: Min guard Sit to supine: Min guard   General bed mobility comments: min guard for safety    Transfers Overall transfer level: Needs assistance Equipment used: None Transfers: Sit to/from Stand Sit to Stand: Min assist         General transfer comment: minA to rise and steady  Ambulation/Gait Ambulation/Gait assistance: Min assist Gait Distance (Feet): 50 Feet Assistive device: IV Pole;None Gait Pattern/deviations: Step-through pattern;Decreased stride length;Decreased weight shift to right;Decreased weight shift to left;Drifts right/left;Wide base of support Gait velocity: decreased   General Gait Details: 45' with IV pole and 17' with no AD requiring minA for balance throughout   Stairs             Wheelchair Mobility    Modified Rankin (Stroke  Patients Only) Modified Rankin (Stroke Patients Only) Pre-Morbid Rankin Score: Moderately severe disability Modified Rankin: Moderately severe disability     Balance Overall balance assessment: Needs assistance Sitting-balance support: Feet supported;Bilateral upper extremity supported Sitting balance-Leahy Scale: Fair     Standing balance support: No upper extremity supported;During functional activity Standing balance-Leahy Scale: Poor Standing balance comment: reliant on external assist                            Cognition Arousal/Alertness: Awake/alert Behavior During Therapy: WFL for tasks assessed/performed Overall Cognitive Status: History of cognitive impairments - at baseline Area of Impairment: Memory;Following commands                     Memory: Decreased short-term memory Following Commands: Follows one step commands consistently       General Comments: patient repeating same question throughout session. Unsure if personality related or memory deficit      Exercises Other Exercises Other Exercises: dynamic reaching tasks outside BOS    General Comments        Pertinent Vitals/Pain Pain Assessment: No/denies pain    Home Living                      Prior Function            PT Goals (current goals can now be found in the care plan section) Acute Rehab PT Goals Patient Stated Goal: Go home PT Goal Formulation: With patient/family Time For  Goal Achievement: 05/26/21 Potential to Achieve Goals: Fair Progress towards PT goals: Progressing toward goals    Frequency    Min 3X/week      PT Plan Current plan remains appropriate    Co-evaluation              AM-PAC PT "6 Clicks" Mobility   Outcome Measure  Help needed turning from your back to your side while in a flat bed without using bedrails?: A Little Help needed moving from lying on your back to sitting on the side of a flat bed without using  bedrails?: A Little Help needed moving to and from a bed to a chair (including a wheelchair)?: A Little Help needed standing up from a chair using your arms (e.g., wheelchair or bedside chair)?: A Little Help needed to walk in hospital room?: A Little Help needed climbing 3-5 steps with a railing? : A Lot 6 Click Score: 17    End of Session Equipment Utilized During Treatment: Gait belt Activity Tolerance: Patient tolerated treatment well Patient left: in bed;with call bell/phone within reach;with bed alarm set Nurse Communication: Mobility status PT Visit Diagnosis: Unsteadiness on feet (R26.81);Muscle weakness (generalized) (M62.81);History of falling (Z91.81);Difficulty in walking, not elsewhere classified (R26.2)     Time: 1191-4782 PT Time Calculation (min) (ACUTE ONLY): 24 min  Charges:  $Therapeutic Activity: 23-37 mins                     Duane [name] A. Gilford Rile PT, DPT Acute Rehabilitation Services Pager [phone] Office [phone]    Linna Hoff 05/15/2021, 12:39 PM

## 2021-05-15 NOTE — Discharge Instructions (Signed)

## 2021-05-15 NOTE — TOC Initial Note (Signed)
LATE NOTE SUBMISSION   Transition of Care [facility]) - Initial/Assessment Note    Patient Details  Name: Duane [name] MRN: [id number] Date of Birth: [birth year]  [facility]) CM/SW Contact:    Geralynn Ochs, LCSW Phone Number: 05/15/2021, 10:27 AM  Clinical Narrative:         CSW alerted by medical team that patient from [facility]. CSW spoke with admissions at [facility], confirmed that patient can return to SNF at discharge. CSW submitted insurance authorization request. CSW alerted by MD that patient could possibly discharge back today if medically cleared, so CSW spoke with patient and family at bedside and they are in agreement. Rapid covid ordered, per SNF request. CSW later updated by MD that patient is not stable for discharge today, will continue to follow for medical improvement. CSW updated SNF. CSW to follow.          Expected Discharge Plan: Skilled Nursing Facility Barriers to Discharge: Continued Medical Work up   Patient Goals and CMS Choice Patient states their goals for this hospitalization and ongoing recovery are:: to get back home CMS Medicare.gov Compare Post Acute Care list provided to:: Patient Choice offered to / list presented to : Patient, Adult Children  Expected Discharge Plan and Services Expected Discharge Plan: [place] Choice: [place] Living arrangements for the past 2 months: [place]                                      Prior Living Arrangements/Services Living arrangements for the past 2 months: [place] Lives with:: Facility Resident Patient language and need for interpreter reviewed:: No Do you feel safe going back to the place where you live?: Yes      Need for Family Participation in Patient Care: Yes (Comment) Care giver support system in place?: Yes (comment)   Criminal Activity/Legal Involvement Pertinent to Current  Situation/Hospitalization: No - Comment as needed  Activities of Daily Living Home Assistive Devices/Equipment: Wheelchair ADL Screening (condition at time of admission) Patient's cognitive ability adequate to safely complete daily activities?: No Is the patient deaf or have difficulty hearing?: No Does the patient have difficulty seeing, even when wearing glasses/contacts?: No Does the patient have difficulty concentrating, remembering, or making decisions?: Yes Patient able to express need for assistance with ADLs?: Yes Does the patient have difficulty dressing or bathing?: Yes Independently performs ADLs?: No Communication: Independent Dressing (OT): Needs assistance Is this a change from baseline?: Change from baseline, expected to last >3 days Grooming: Needs assistance Is this a change from baseline?: Change from baseline, expected to last >3 days Feeding: Independent Bathing: Needs assistance Is this a change from baseline?: Change from baseline, expected to last >3 days Toileting: Needs assistance Is this a change from baseline?: Change from baseline, expected to last >3days In/Out Bed: Needs assistance Is this a change from baseline?: Change from baseline, expected to last >3 days Walks in Home: Needs assistance Is this a change from baseline?: Change from baseline, expected to last >3 days Does the patient have difficulty walking or climbing stairs?: Yes Weakness of Legs: Both Weakness of Arms/Hands: None  Permission Sought/Granted Permission sought to share information with : Facility Sport and exercise psychologist, Family Supports Permission granted to share information with : Yes, Verbal Permission Granted  Share Information with NAME: Mikki Santee, Chip  Permission granted to share info w AGENCY: [place] granted to share info w Relationship: Sons     Emotional Assessment Appearance:: Appears stated age Attitude/Demeanor/Rapport: Engaged Affect (typically  observed): Appropriate Orientation: : Oriented to Self, Oriented to Place, Oriented to  Time, Oriented to Situation Alcohol / Substance Use: Not Applicable Psych Involvement: No (comment)  Admission diagnosis:  Trauma [T14.90XA] 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] Ischemic stroke [facility]) [I63.9] Patient Active Problem List   Diagnosis Date Noted   Overweight (BMI 25.0-29.9) 05/12/2021   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   CKD (chronic kidney disease), stage III ([place]) 05/11/2021   Hyperlipidemia 05/11/2021   Hyponatremia 09/05/2019   Diarrhea 09/05/2019   UTI (urinary tract infection) 09/05/2019   Encephalopathy 07/23/2019   Essential hypertension 09/08/2016   PAF (paroxysmal atrial fibrillation) ([place]) 09/08/2016   Diabetes mellitus type 2, controlled ([place]) 09/08/2016   ARF (acute renal failure) ([place]) 09/08/2016   Normochromic normocytic anemia 09/08/2016   Abdominal pain 09/08/2016   PCP:  [name], MD Pharmacy:   [facility], [place] - [address] [place] [address] Phone: [phone] Fax: [phone]  [facility]  ([place], [place] [address] Phone: [phone] Fax: [phone]     Social Determinants of Health (SDOH) Interventions    Readmission Risk Interventions No flowsheet data found.

## 2021-05-15 NOTE — Discharge Summary (Addendum)
Physician Discharge Summary  JONNIE TRUXILLO JME:[id number] DOB: 1935-10-08  PCP: [name], MD  Admitted from: ALF Discharged to: SNF  Admit date: 05/11/2021 Discharge date: 05/15/2021  Recommendations for Outpatient Follow-up:    Contact information for follow-up providers     [facility]. Schedule an appointment as soon as possible for a visit in 1 month(s).   Specialty: Neurology Why: stroke clinic Contact information: [place] [phone]        MD at SNF. Schedule an appointment as soon as possible for a visit.   Why: To be seen in 3 to 4 days with repeat labs (CBC & BMP).        [name], MD. Schedule an appointment as soon as possible for a visit.   Specialty: Internal Medicine Why: Upon discharge from SNF. Contact information: [address] [place] [address] [phone]         Einar Crow, MD .   Specialty: Cardiology Contact information: [place] [phone]         Alfonso Patten, MD Follow up in 2 week(s).   Specialty: Cardiology Contact information: [place] [phone]              Contact information for after-discharge care     Destination     [facility] .   Service: Skilled Nursing Contact information: [employer] [place]: None    Equipment/Devices: TBD at [facility]    Discharge Condition: Improved and stable.   Code Status: Full Code Diet recommendation:  Discharge Diet Orders (From admission, onward)     Start     Ordered   05/15/21 0000  Diet - low sodium heart healthy        05/15/21 1232   05/15/21 0000  Diet Carb Modified        05/15/21 1232             Discharge Diagnoses:  Principal Problem:   Stroke  (cerebrum) ([place]) Active Problems:   Essential hypertension   PAF (paroxysmal atrial fibrillation) ([place])   Diabetes mellitus type 2, controlled ([place])   CKD (chronic kidney disease), stage III (HCC)   Hyperlipidemia   Overweight (BMI 25.0-29.9)   Brief Summary: 86 year old male with past medical history of diabetes mellitus type 2, stage III chronic kidney disease, obstructive sleep apnea, hypertension and atrial fibrillation on Eliquis, presented to the emergency room on 6/27 with slurred speech, right facial droop and pronator drift.  Patient had reportedly been taking medications including Eliquis and Plavix.  CT scan of head unremarkable, but MRI noted acute infarct in the posterior limb of the left internal capsule and patient admitted for stroke work-up to the hospitalist service.  MRA of the head noted mild narrowing of left P2/PCA segment.  Carotid Dopplers noted less than 50% stenosis of bilateral ICA and left ECA greater than 50% stenosis.  Echocardiogram done unremarkable.  Seen by PT and OT who are recommending skilled nursing.  EP cardiology consulted on 6/29 for pauses >7 seconds noted on telemetry overnight.     Assessment/Plan: Principal Problem:   Acute CVA [facility]): Neurology/stroke MD consulted and assisted with evaluation and management.  Has completed  stroke evaluation.  Diagnosed with left internal capsule stroke.  MRI brain showed a left internal capsule stroke.  MRA head showed moderate narrowing of the proximal left P2 PCA segment.  2D echo with LVEF 60-65%.  Carotid ultrasound with right ICA 1-39% stenosis and left ECA >50% stenosed.  LDL 60.9 and A1c 8.6.  Stroke MD suspecting that the internal capsule stroke is due to small vessel disease.  Prior to admission, patient on Eliquis for A. fib and on Plavix for CAD.  Due to creatinine greater than 1.5, Eliquis has been decreased to 2.5 twice daily but if creatinine improves again to less than 1.5, may consider increasing back to 5  mg twice daily.  Although neurology indicates no need for Plavix, EP Cardiology discussed with patient's primary cardiologist who recommends continuing Plavix for cardiac indications.  Therapies evaluated and recommend SNF.   Active Problems:   Essential hypertension: Permissive hypertension duration now completed.  As discussed with EP cardiology, discontinued sotalol indefinitely but resumed and increase home metoprolol to double the dose i.e. 50 mg twice daily.  BP uncontrolled and initiated amlodipine 5 Mg daily.     PAF (paroxysmal atrial fibrillation) (HCC) acute CVA does not look to be related to atrial fibrillation.  Overnight 6/28, had brief episode of A. fib with RVR and received a dose of IV metoprolol.  PTA, patient was on sotalol, metoprolol and Eliquis.  Sotalol has been held since admission.  As per EP cardiology, A. fib breakthrough felt to be due to acute illness, holding sotalol and electrolyte abnormalities.  As per my discussion with Dr. Caryl Comes, discontinued sotalol indefinitely due to severe hypokalemia on admission/recurrent issue, resumed and increased metoprolol to 50 mg twice daily and continue Eliquis.  Outpatient follow-up with Dr. Gabrielle Dare, primary cardiologist in [facility], [place].  CHA2DS2-VASc score of at least 7.  Remains in sinus rhythm.   Nocturnal pause: Noted 7.78-second pause on telemetry on 05/13/2021 at approximately 3:41 AM.  Asymptomatic.  EP cardiology consultation appreciated.  I discussed with Dr. Caryl Comes.  Suspected due to OSA, had received IV Lopressor approximately 2 hours prior and?  Increased vagal tone in the setting of CVA.  EP recommends discontinuing sotalol, no pacemaker, resuming metoprolol at increased dose.  No further pauses noted on telemetry.   Hypokalemia/hypomagnesemia Replaced.  Attempt to keep potassium >4 and magnesium >2.   Diabetes mellitus type 2, controlled ([place]): Hemoglobin A1c suggests poor outpatient control.  In the hospital, oral  hypoglycemics were held, patient was initiated on Lantus 5 units at bedtime and placed on sliding scale insulin with improved control.  At discharge, resume home dose of metformin and Januvia.  Will change Lantus to Levemir 5 units at bedtime given renal insufficiency.  Monitor CBGs closely and adjust insulins as needed and may consider adding SSI.   Acute kidney injury complicating CKD (chronic kidney disease), stage III b(HCC): Baseline creatinine as in Care Everywhere appears to be in the 1.3-1.4 range in October 2021.  Creatinine has now increased to 1.7.  Renal ultrasound negative for hydronephrosis.  Likely due to dehydration.  Patient volunteers to drinking a lot of water at home.  Bladder scan: 44 mL.  Hydrated with approximately 48 hours of IV fluids with improvement in creatinine to 1.53.  Continue to encourage oral fluid intake and follow BMP closely at SNF.  ARB currently on hold due to recent AKI.    Hyperlipidemia/hypertriglyceridemia: Triglycerides 619.  Neurology has increased the dose of fenofibrate to 160  mg daily.  Follow fasting lipid panels and LFTs in 4 to 6 weeks as outpatient.     Overweight (BMI 25.0-29.9): Patient meets criteria BMI greater than 25.   OSA: Unsure if patient is on CPAP at home.  Recommend outpatient testing if not done already.   Anemia, suspect due to chronic disease/chronic kidney disease versus other etiologies: Stable.  Outpatient follow-up   CHF was apparently documented in H&P. CHF is ruled out.     Consultants: Neuro EP cardiology   Procedures: Echocardiogram: preserved ejection fraction,   Carotid dopplers: no sig intracranial stenosis    Discharge Instructions  Discharge Instructions     Ambulatory referral to Neurology   Complete by: As directed    Follow up with stroke clinic NP (Jessica Vanschaick or Cecille Rubin, if both not available, consider Zachery Dauer, or Ahern) at [facility] in about 4 weeks. Thanks.   Call MD for:   Complete  by: As directed    Recurrent strokelike symptoms.   Call MD for:  difficulty breathing, headache or visual disturbances   Complete by: As directed    Call MD for:  extreme fatigue   Complete by: As directed    Call MD for:  persistant dizziness or light-headedness   Complete by: As directed    Call MD for:  persistant nausea and vomiting   Complete by: As directed    Call MD for:  severe uncontrolled pain   Complete by: As directed    Call MD for:  temperature >100.4   Complete by: As directed    Diet - low sodium heart healthy   Complete by: As directed    Diet Carb Modified   Complete by: As directed    Increase activity slowly   Complete by: As directed         Medication List     STOP taking these medications    losartan 100 MG tablet Commonly known as: COZAAR   sotalol 80 MG tablet Commonly known as: BETAPACE       TAKE these medications    acetaminophen 325 MG tablet Commonly known as: TYLENOL Take 2 tablets (650 mg total) by mouth every 6 (six) hours as needed for mild pain, moderate pain or headache. What changed:  medication strength how much to take when to take this reasons to take this additional instructions   amLODipine 5 MG tablet Commonly known as: NORVASC Take 1 tablet (5 mg total) by mouth daily. Start taking on: May 16, 2021   apixaban 2.5 MG Tabs tablet Commonly known as: ELIQUIS Take 1 tablet (2.5 mg total) by mouth 2 (two) times daily. Increase to 5 Mg 2 times daily when creatinine less than 1.5 on follow-up BMP check. What changed:  medication strength how much to take additional instructions   Carboxymethylcellulose Sod PF 1 % Gel Apply 1 application to eye 3 (three) times daily as needed (dry eyes).   clopidogrel 75 MG tablet Commonly known as: PLAVIX Take 75 mg by mouth daily.   EPINEPHrine 0.3 mg/0.3 mL Soaj injection Commonly known as: EPI-PEN Inject 0.3 mg into the muscle once as needed (severe allergic reaction).    fenofibrate 160 MG tablet Take 1 tablet (160 mg total) by mouth daily. What changed:  medication strength how much to take   insulin detemir 100 UNIT/ML injection Commonly known as: Levemir Inject 0.05 mLs (5 Units total) into the skin at bedtime.   loperamide 2 MG capsule Commonly known as: IMODIUM  Take 1 capsule (2 mg total) by mouth every 6 (six) hours as needed for diarrhea or loose stools.   loratadine 10 MG tablet Commonly known as: CLARITIN Take 10 mg by mouth daily.   memantine 5 MG tablet Commonly known as: NAMENDA Take 5 mg by mouth daily.   metFORMIN 1000 MG tablet Commonly known as: GLUCOPHAGE Take 1,000 mg by mouth 2 (two) times daily with a meal.   metoprolol tartrate 50 MG tablet Commonly known as: LOPRESSOR Take 1 tablet (50 mg total) by mouth 2 (two) times daily. What changed:  medication strength how much to take Another medication with the same name was removed. Continue taking this medication, and follow the directions you see here.   nitroGLYCERIN 0.4 MG SL tablet Commonly known as: NITROSTAT Place 0.4 mg under the tongue every 5 (five) minutes as needed for chest pain.   pantoprazole 20 MG tablet Commonly known as: PROTONIX Take 20 mg by mouth daily.   PROBIOTIC PO Take 1 capsule by mouth daily. Probiotic 3 billion cell capsule   QC Calcium 500mg -D3 500-200 MG-UNIT Tabs Generic drug: Calcium-Cholecalciferol Take 1 tablet by mouth daily.   Saw Palmetto 450 MG Caps Take 450 mg by mouth daily.   sitaGLIPtin 25 MG tablet Commonly known as: JANUVIA Take 25 mg by mouth daily.   SUPER B-COMPLEX + VITAMIN C PO Take 1 tablet by mouth daily. uper b complex + c 150mg  tablet   ULTRA OMEGA 3 PO Take 1-2 capsules by mouth See admin instructions. Ultra omega-3 500mg -1000mg  capsule 1 capsule in the morning, 2 capsules in the evening   Visine-A 0.025-0.3 % ophthalmic solution Generic drug: naphazoline-pheniramine Place 1 drop into both eyes 3  (three) times daily as needed for eye irritation.   vitamin C 1000 MG tablet Take 1,000 mg by mouth daily.   Vitamin D-3 125 MCG (5000 UT) Tabs Take 5,000 Units by mouth daily.   Zinc 23 MG Lozg Take 23 mg by mouth daily.       Allergies  Allergen Reactions   Baclofen Other (See Comments)    Severe AMS, comatose, intubated   Bee Venom Anaphylaxis   Contrast Media [Iodinated Diagnostic Agents] Anaphylaxis   Iodine-131 Anaphylaxis   Metrizamide Anaphylaxis   Metronidazole Anaphylaxis   Atorvastatin Other (See Comments)    Aching in legs   Lisinopril Cough      Procedures/Studies: CT HEAD WO CONTRAST  Result Date: 05/11/2021 CLINICAL DATA:  Fall.  Head trauma EXAM: CT HEAD WITHOUT CONTRAST CT CERVICAL SPINE WITHOUT CONTRAST TECHNIQUE: Multidetector CT imaging of the head and cervical spine was performed following the standard protocol without intravenous contrast. Multiplanar CT image reconstructions of the cervical spine were also generated. COMPARISON:  07/23/2019 FINDINGS: CT HEAD FINDINGS Brain: Generalized atrophy. Moderate white matter hypodensity bilaterally, unchanged. Mild ventricular enlargement due to atrophy, unchanged. Negative for acute infarct, hemorrhage, mass Vascular: Negative for hyperdense vessel Skull: Negative Sinuses/Orbits: Mild mucosal edema paranasal sinuses. Bilateral cataract extraction. Other: None CT CERVICAL SPINE FINDINGS Alignment: Mild anterolisthesis C4-5 Skull base and vertebrae: Negative for fracture Soft tissues and spinal canal: Negative Disc levels: Multilevel disc and facet degeneration. Foraminal stenosis bilaterally most prominent C4-5 and C5-6 due to spurring. Upper chest: Lung apices clear bilaterally. Other: None IMPRESSION: 1. No acute intracranial abnormality. Atrophy with chronic microvascular ischemic change in the white matter 2. Negative for cervical spine fracture.  Multilevel spondylosis. Electronically Signed   By: Franchot Gallo  M.D.   On: 05/11/2021  15:07   CT CERVICAL SPINE WO CONTRAST  Result Date: 05/11/2021 CLINICAL DATA:  Fall.  Head trauma EXAM: CT HEAD WITHOUT CONTRAST CT CERVICAL SPINE WITHOUT CONTRAST TECHNIQUE: Multidetector CT imaging of the head and cervical spine was performed following the standard protocol without intravenous contrast. Multiplanar CT image reconstructions of the cervical spine were also generated. COMPARISON:  07/23/2019 FINDINGS: CT HEAD FINDINGS Brain: Generalized atrophy. Moderate white matter hypodensity bilaterally, unchanged. Mild ventricular enlargement due to atrophy, unchanged. Negative for acute infarct, hemorrhage, mass Vascular: Negative for hyperdense vessel Skull: Negative Sinuses/Orbits: Mild mucosal edema paranasal sinuses. Bilateral cataract extraction. Other: None CT CERVICAL SPINE FINDINGS Alignment: Mild anterolisthesis C4-5 Skull base and vertebrae: Negative for fracture Soft tissues and spinal canal: Negative Disc levels: Multilevel disc and facet degeneration. Foraminal stenosis bilaterally most prominent C4-5 and C5-6 due to spurring. Upper chest: Lung apices clear bilaterally. Other: None IMPRESSION: 1. No acute intracranial abnormality. Atrophy with chronic microvascular ischemic change in the white matter 2. Negative for cervical spine fracture.  Multilevel spondylosis. Electronically Signed   By: Franchot Gallo M.D.   On: 05/11/2021 15:07   MR ANGIO HEAD WO CONTRAST  Result Date: 05/12/2021 CLINICAL DATA:  Neuro deficit, acute, stroke suspected. EXAM: MRA HEAD WITHOUT CONTRAST TECHNIQUE: Angiographic images of the Circle of Willis were acquired using MRA technique without intravenous contrast. COMPARISON:  No pertinent prior exam. FINDINGS: Anterior circulation: The visualized portions of the distal cervical and intracranial internal carotid arteries are widely patent with normal flow related enhancement. The bilateral anterior cerebral arteries and middle cerebral  arteries are widely patent with antegrade flow without high-grade flow-limiting stenosis or proximal branch occlusion. No intracranial aneurysm within the anterior circulation. Posterior circulation: The vertebral arteries are widely patent with antegrade flow. Vertebrobasilar junction and basilar artery are widely patent with antegrade flow without evidence of basilar stenosis or aneurysm. Mild stenosis of the proximal left P2/PCA segment is seen. The right posterior cerebral artery is normal. No intracranial aneurysm within the posterior circulation. Anatomic variants: Hypoplastic left A1/ACA segment. Other: None. IMPRESSION: Mild narrowing of the proximal left P2/PCA segment. Otherwise, unremarkable MR angiogram of the head. Electronically Signed   By: Pedro Earls M.D.   On: 05/12/2021 10:05   MR Brain Wo Contrast (neuro protocol)  Result Date: 05/11/2021 CLINICAL DATA:  Neuro deficit, acute stroke suspected. EXAM: MRI HEAD WITHOUT CONTRAST TECHNIQUE: Multiplanar, multiecho pulse sequences of the brain and surrounding structures were obtained without intravenous contrast. COMPARISON:  Same day CT head.  MRI 07/23/2019. FINDINGS: Brain: Acute infarct in the posterior limb of the left internal capsule. Trace edema without mass effect. No acute hemorrhage. No mass lesion or abnormal mass effect. Neuro extra-axial fluid collection. Moderate atrophy with ex vacuo ventricular dilation, similar to priors. Moderate patchy T2/FLAIR hyperintensities within the white matter, most likely related to chronic microvascular ischemic disease. Vascular: Major arterial flow voids maintained at the skull base. Skull and upper cervical spine: Normal marrow signal. Sinuses/Orbits: Moderate ethmoid air cell mucosal thickening with milder mucosal thickening of the left maxillary sinus, sphenoid and frontal sinuses. No air-fluid levels. Unremarkable orbits. Other: No sizable mastoid effusions. IMPRESSION: 1. Acute  infarct in the posterior limb of the left internal capsule.Trace edema without mass effect. 2. Moderate atrophy and chronic microvascular ischemic disease. 3. Paranasal sinus disease, as described above. Electronically Signed   By: Margaretha Sheffield MD   On: 05/11/2021 18:03   US RENAL  Result Date: 05/13/2021 CLINICAL DATA:  Acute  kidney injury EXAM: RENAL / URINARY TRACT ULTRASOUND COMPLETE COMPARISON:  Ultrasound 09/09/2016, CT 08/19/2020 FINDINGS: Right Kidney: Renal measurements: 11.1 x 5.5 x 4.1 cm = volume: 130.8 mL. Echogenicity within normal limits. No mass or hydronephrosis visualized. Left Kidney: Renal measurements: 10 x 5.1 x 4.2 cm = volume: 171.8 mL. Echogenicity within normal limits. No mass or hydronephrosis visualized. Bladder: Appears normal for degree of bladder distention. Other: None. IMPRESSION: Negative renal ultrasound Electronically Signed   By: Donavan Foil M.D.   On: 05/13/2021 21:43   DG Pelvis Portable  Result Date: 05/11/2021 CLINICAL DATA:  Recent fall, initial encounter EXAM: PORTABLE PELVIS 1-2 VIEWS COMPARISON:  None. FINDINGS: Pelvic ring is intact. Penile implant is noted. Mild degenerative changes of the hip joints are noted as well as the lower lumbar spine. No acute fracture or dislocation is seen. IMPRESSION: Degenerative change without acute bony abnormality. Electronically Signed   By: Inez Catalina M.D.   On: 05/11/2021 14:45   DG Chest Port 1 View  Result Date: 05/11/2021 CLINICAL DATA:  Recent fall while on blood thinners EXAM: PORTABLE CHEST 1 VIEW COMPARISON:  05/10/2021 FINDINGS: Cardiac shadow is stable. Lungs are well aerated bilaterally. No focal infiltrate or sizable effusion is seen. Stable scarring in the left base is noted. No bony abnormality is noted. IMPRESSION: Stable left basilar scarring.  No acute abnormality seen. Electronically Signed   By: Inez Catalina M.D.   On: 05/11/2021 14:45   ECHOCARDIOGRAM COMPLETE  Result Date: 05/12/2021     ECHOCARDIOGRAM REPORT   Patient Name:   BRYSAN MCEVOY Date of Exam: 05/12/2021 Medical Rec #:  [id number]       Height:       70.0 in Accession #:    [id number]      Weight:       205.0 lb Date of Birth:  [birth year]       BSA:          2.109 m Patient Age:    86 years        BP:           197/95 mmHg Patient Gender: M               HR:           86 bpm. Exam Location:  Inpatient Procedure: 2D Echo Indications:    stroke  History:        Patient has no prior history of Echocardiogram examinations.                 CAD, chronic kidney disease, Arrythmias:Atrial Fibrillation;                 Risk Factors:Diabetes, Dyslipidemia, Hypertension and Sleep                 Apnea.  Sonographer:    Johny Chess Referring Phys: [place]  1. Left ventricular ejection fraction, by estimation, is 60 to 65%. The left ventricle has normal function. The left ventricle has no regional wall motion abnormalities. Indeterminate diastolic filling due to E-A fusion.  2. Right ventricular systolic function is normal. The right ventricular size is normal.  3. The mitral valve is normal in structure. No evidence of mitral valve regurgitation. No evidence of mitral stenosis.  4. The aortic valve is normal in structure. There is mild calcification of the aortic valve. There is moderate thickening of the aortic valve. Aortic valve regurgitation is not visualized. Mild  to moderate aortic valve sclerosis/calcification is present, without any evidence of aortic stenosis.  5. The inferior vena cava is normal in size with greater than 50% respiratory variability, suggesting right atrial pressure of 3 mmHg. FINDINGS  Left Ventricle: Left ventricular ejection fraction, by estimation, is 60 to 65%. The left ventricle has normal function. The left ventricle has no regional wall motion abnormalities. The left ventricular internal cavity size was normal in size. There is  no left ventricular hypertrophy. Indeterminate  diastolic filling due to E-A fusion. Right Ventricle: The right ventricular size is normal. No increase in right ventricular wall thickness. Right ventricular systolic function is normal. Left Atrium: Left atrial size was normal in size. Right Atrium: Right atrial size was normal in size. Pericardium: There is no evidence of pericardial effusion. Mitral Valve: The mitral valve is normal in structure. Mild to moderate mitral annular calcification. No evidence of mitral valve regurgitation. No evidence of mitral valve stenosis. Tricuspid Valve: The tricuspid valve is normal in structure. Tricuspid valve regurgitation is not demonstrated. No evidence of tricuspid stenosis. Aortic Valve: The aortic valve is normal in structure. There is mild calcification of the aortic valve. There is moderate thickening of the aortic valve. Aortic valve regurgitation is not visualized. Mild to moderate aortic valve sclerosis/calcification is present, without any evidence of aortic stenosis. Pulmonic Valve: The pulmonic valve was normal in structure. Pulmonic valve regurgitation is not visualized. No evidence of pulmonic stenosis. Aorta: The aortic root is normal in size and structure. Venous: The inferior vena cava is normal in size with greater than 50% respiratory variability, suggesting right atrial pressure of 3 mmHg. IAS/Shunts: No atrial level shunt detected by color flow Doppler.  LEFT VENTRICLE PLAX 2D LVIDd:         3.30 cm Diastology LVIDs:         2.30 cm LV e' medial:  4.68 cm/s LV PW:         1.10 cm LV e' lateral: 7.29 cm/s LV IVS:        1.10 cm  RIGHT VENTRICLE RV S prime:     11.90 cm/s TAPSE (M-mode): 1.8 cm LEFT ATRIUM             Index       RIGHT ATRIUM          Index LA diam:        3.50 cm 1.66 cm/m  RA Area:     9.77 cm LA Vol (A2C):   38.2 ml 18.11 ml/m RA Volume:   18.20 ml 8.63 ml/m LA Vol (A4C):   55.5 ml 26.31 ml/m LA Biplane Vol: 50.1 ml 23.75 ml/m  AORTIC VALVE LVOT Vmax:   77.10 cm/s LVOT Vmean:   49.400 cm/s LVOT VTI:    0.144 m  AORTA Ao Asc diam: 2.90 cm  SHUNTS Systemic VTI: 0.14 m Dani Gobble Croitoru MD Electronically signed by Sanda Klein MD Signature Date/Time: 05/12/2021/3:19:09 PM    Final    VAS US CAROTID  Result Date: 05/12/2021 Carotid Arterial Duplex Study Patient Name:  JAXON FLATT  Date of Exam:   05/12/2021 Medical Rec #: [id number]        Accession #:    [id number] Date of Birth: December 14, 1934        Patient Gender: M Patient Age:   21Y Exam Location:  [facility] Procedure:      VAS US CAROTID Referring Phys: [id number] [place] --------------------------------------------------------------------------------  Indications:  Right facial droop, aphasia, ride side drift. Risk Factors:      Hypertension, hyperlipidemia, Diabetes, no history of                    smoking, prior CVA. Other Factors:     Afib, CKD3. Comparison Study:  No previous exams Performing Technologist: Jody Hill RVT, RDMS  Examination Guidelines: A complete evaluation includes B-mode imaging, spectral Doppler, color Doppler, and power Doppler as needed of all accessible portions of each vessel. Bilateral testing is considered an integral part of a complete examination. Limited examinations for reoccurring indications may be performed as noted.  Right Carotid Findings: +----------+-------+-------+--------+------------------------+-----------------+           PSV    EDV    StenosisPlaque Description      Comments                    cm/s   cm/s                                                     +----------+-------+-------+--------+------------------------+-----------------+ CCA Prox  48     11                                     intimal                                                                   thickening        +----------+-------+-------+--------+------------------------+-----------------+ CCA Distal60     13             smooth, calcific and    tortuous                                           focal                                     +----------+-------+-------+--------+------------------------+-----------------+ ICA Prox  60     18     1-39%   heterogenous                              +----------+-------+-------+--------+------------------------+-----------------+ ICA Distal83     30                                                       +----------+-------+-------+--------+------------------------+-----------------+ ECA       151    0              calcific and  heterogenous                              +----------+-------+-------+--------+------------------------+-----------------+ +----------+--------+-------+--------+-------------------+           PSV cm/sEDV cmsDescribeArm Pressure (mmHG) +----------+--------+-------+--------+-------------------+ Subclavian203            Stenotic                    +----------+--------+-------+--------+-------------------+ +---------+--------+--+--------+--+---------+ VertebralPSV cm/s70EDV cm/s13Antegrade +---------+--------+--+--------+--+---------+  Left Carotid Findings: +----------+-------+-------+--------+------------------------+-----------------+           PSV    EDV    StenosisPlaque Description      Comments                    cm/s   cm/s                                                     +----------+-------+-------+--------+------------------------+-----------------+ CCA Prox  60     10                                     intimal                                                                   thickening        +----------+-------+-------+--------+------------------------+-----------------+ CCA Distal67     14             calcific, focal and     intimal                                           smooth                  thickening         +----------+-------+-------+--------+------------------------+-----------------+ ICA Prox  92     19     1-39%   calcific and smooth                       +----------+-------+-------+--------+------------------------+-----------------+ ICA Distal73     23                                                       +----------+-------+-------+--------+------------------------+-----------------+ ECA       310    0      >50%    heterogenous and                                                          calcific                                  +----------+-------+-------+--------+------------------------+-----------------+ +----------+--------+--------+----------------+-------------------+  PSV cm/sEDV cm/sDescribe        Arm Pressure (mmHG) +----------+--------+--------+----------------+-------------------+ Subclavian114             Multiphasic, WNL                    +----------+--------+--------+----------------+-------------------+ +---------+--------+--+--------+--+---------+ VertebralPSV cm/s38EDV cm/s12Antegrade +---------+--------+--+--------+--+---------+   Summary: Right Carotid: Velocities in the right ICA are consistent with a 1-39% stenosis.                Non-hemodynamically significant plaque <50% noted in the CCA. The                ECA appears <50% stenosed. Left Carotid: Non-hemodynamically significant plaque <50% noted in the CCA. The               ECA appears >50% stenosed. Vertebrals:  Bilateral vertebral arteries demonstrate antegrade flow. Subclavians: Right subclavian artery was stenotic. Normal flow hemodynamics were              seen in the left subclavian artery. *See table(s) above for measurements and observations.  Electronically signed by Deitra Mayo MD on 05/12/2021 at 2:55:24 PM.    Final       Subjective: Patient examined along with RN in the room.  Patient anxious to be discharged "home" today.  Denies complaints.  Per  nursing, able to stand up at bedside by himself and urinate in the urinal.  Did not complain of feeling thirsty today.  Discharge Exam:  Vitals:   05/14/21 2348 05/15/21 0339 05/15/21 0757 05/15/21 1130  BP: (!) 146/61 (!) 180/79 (!) 176/68 (!) 180/73  Pulse: (!) 59 71 85 70  Resp: 19 19 18 17   Temp: 98.1 F (36.7 C) (!) 97.5 F (36.4 C) 97.8 F (36.6 C) 98.1 F (36.7 C)  TempSrc: Oral Oral Oral Oral  SpO2: 97% 95% 97% 97%  Weight:      Height:        General: Elderly male, moderately built and nourished lying comfortably supine in bed without distress.  Oral mucosa dry.  Reduced skin turgor. Cardiovascular: S1 and S2 heard, RRR.  No JVD, murmurs or pedal edema.  Telemetry personally reviewed: Sinus rhythm.  No further pauses or A. fib. Respiratory: Clear to auscultation bilaterally.  No increased work of breathing. Abdomen: soft, NT, ND, +BS Musculoskeletal: No clubbing cyanosis or lower extremity edema. Psychiatry: Appropriate, no evidence of psychoses Neurology: Alert and oriented x3.  Decreased left nasolabial fold.  Dysarthria present.  Right upper extremity grade 4+ by 5 power.  Others grade 5 x 5 power.  Left shoulder with good range of movements and not painful.  No change in stable.    The results of significant diagnostics from this hospitalization (including imaging, microbiology, ancillary and laboratory) are listed below for reference.     Microbiology: Recent Results (from the past 240 hour(s))  Resp Panel by RT-PCR (Flu A&B, Covid) Nasopharyngeal Swab     Status: None   Collection Time: 05/11/21  2:50 PM   Specimen: Nasopharyngeal Swab; Nasopharyngeal(NP) swabs in vial transport medium  Result Value Ref Range Status   SARS Coronavirus 2 by RT PCR NEGATIVE NEGATIVE Final    Comment: (NOTE) SARS-CoV-2 target nucleic acids are NOT DETECTED.  The SARS-CoV-2 RNA is generally detectable in upper respiratory specimens during the acute phase of infection. The  lowest concentration of SARS-CoV-2 viral copies this assay can detect is 138 copies/mL. A negative result does not preclude SARS-Cov-2 infection and should  not be used as the sole basis for treatment or other patient management decisions. A negative result may occur with  improper specimen collection/handling, submission of specimen other than nasopharyngeal swab, presence of viral mutation(s) within the areas targeted by this assay, and inadequate number of viral copies(<138 copies/mL). A negative result must be combined with clinical observations, patient history, and epidemiological information. The expected result is Negative.  Fact Sheet for Patients:  EntrepreneurPulse.com.au  Fact Sheet for Healthcare Providers:  IncredibleEmployment.be  This test is no t yet approved or cleared by the [place] FDA and  has been authorized for detection and/or diagnosis of SARS-CoV-2 by FDA under an Emergency Use Authorization (EUA). This EUA will remain  in effect (meaning this test can be used) for the duration of the COVID-19 declaration under Section 564(b)(1) of the Act, 21 U.S.C.section 360bbb-3(b)(1), unless the authorization is terminated  or revoked sooner.       Influenza A by PCR NEGATIVE NEGATIVE Final   Influenza B by PCR NEGATIVE NEGATIVE Final    Comment: (NOTE) The Xpert Xpress SARS-CoV-2/FLU/RSV plus assay is intended as an aid in the diagnosis of influenza from Nasopharyngeal swab specimens and should not be used as a sole basis for treatment. Nasal washings and aspirates are unacceptable for Xpert Xpress SARS-CoV-2/FLU/RSV testing.  Fact Sheet for Patients: EntrepreneurPulse.com.au  Fact Sheet for Healthcare Providers: IncredibleEmployment.be  This test is not yet approved or cleared by the [place] FDA and has been authorized for detection and/or diagnosis of SARS-CoV-2 by FDA under  an Emergency Use Authorization (EUA). This EUA will remain in effect (meaning this test can be used) for the duration of the COVID-19 declaration under Section 564(b)(1) of the Act, 21 U.S.C. section 360bbb-3(b)(1), unless the authorization is terminated or revoked.  Performed at [facility], [place] [address], [place], [place] [address]   MRSA Next Gen by PCR, Nasal     Status: None   Collection Time: 05/12/21  2:58 AM   Specimen: Nasal Mucosa; Nasal Swab  Result Value Ref Range Status   MRSA by PCR Next Gen NOT DETECTED NOT DETECTED Final    Comment: (NOTE) The GeneXpert MRSA Assay (FDA approved for NASAL specimens only), is one component of a comprehensive MRSA colonization surveillance program. It is not intended to diagnose MRSA infection nor to guide or monitor treatment for MRSA infections. Test performance is not FDA approved in patients less than 32 years old. Performed at [facility], [place] [address] [place], [place] [address]   Resp Panel by RT-PCR (Flu A&B, Covid) Nasopharyngeal Swab     Status: None   Collection Time: 05/13/21 11:11 AM   Specimen: Nasopharyngeal Swab; Nasopharyngeal(NP) swabs in vial transport medium  Result Value Ref Range Status   SARS Coronavirus 2 by RT PCR NEGATIVE NEGATIVE Final    Comment: (NOTE) SARS-CoV-2 target nucleic acids are NOT DETECTED.  The SARS-CoV-2 RNA is generally detectable in upper respiratory specimens during the acute phase of infection. The lowest concentration of SARS-CoV-2 viral copies this assay can detect is 138 copies/mL. A negative result does not preclude SARS-Cov-2 infection and should not be used as the sole basis for treatment or other patient management decisions. A negative result may occur with  improper specimen collection/handling, submission of specimen other than nasopharyngeal swab, presence of viral mutation(s) within the areas targeted by this assay, and inadequate number of  viral copies(<138 copies/mL). A negative result must be combined with clinical observations, patient  history, and epidemiological information. The expected result is Negative.  Fact Sheet for Patients:  EntrepreneurPulse.com.au  Fact Sheet for Healthcare Providers:  IncredibleEmployment.be  This test is no t yet approved or cleared by the [place] FDA and  has been authorized for detection and/or diagnosis of SARS-CoV-2 by FDA under an Emergency Use Authorization (EUA). This EUA will remain  in effect (meaning this test can be used) for the duration of the COVID-19 declaration under Section 564(b)(1) of the Act, 21 U.S.C.section 360bbb-3(b)(1), unless the authorization is terminated  or revoked sooner.       Influenza A by PCR NEGATIVE NEGATIVE Final   Influenza B by PCR NEGATIVE NEGATIVE Final    Comment: (NOTE) The Xpert Xpress SARS-CoV-2/FLU/RSV plus assay is intended as an aid in the diagnosis of influenza from Nasopharyngeal swab specimens and should not be used as a sole basis for treatment. Nasal washings and aspirates are unacceptable for Xpert Xpress SARS-CoV-2/FLU/RSV testing.  Fact Sheet for Patients: EntrepreneurPulse.com.au  Fact Sheet for Healthcare Providers: IncredibleEmployment.be  This test is not yet approved or cleared by the [place] FDA and has been authorized for detection and/or diagnosis of SARS-CoV-2 by FDA under an Emergency Use Authorization (EUA). This EUA will remain in effect (meaning this test can be used) for the duration of the COVID-19 declaration under Section 564(b)(1) of the Act, 21 U.S.C. section 360bbb-3(b)(1), unless the authorization is terminated or revoked.  Performed at [facility], [place] [address], [place],  [address]      Labs: CBC: Recent Labs  Lab 05/11/21 1342 05/11/21 1356 05/11/21 1415 05/11/21 1453 05/12/21 1019   WBC 9.1  --  8.4  --  7.5  NEUTROABS 5.0  --   --   --   --   HGB 11.6* 11.6* 12.5* 11.9* 12.5*  HCT 36.0* 34.0* 37.2* 35.0* 37.1*  MCV 91.8  --  89.6  --  86.9  PLT 206  --  192  --  812    Basic Metabolic Panel: Recent Labs  Lab 05/12/21 1019 05/13/21 0223 05/13/21 1329 05/14/21 0431 05/15/21 0300  NA 137 134* 133* 135 137  K 3.6 3.1* 4.2 4.2 4.6  CL 100 105 104 103 108  CO2 23 19* 20* 22 25  GLUCOSE 149* 210* 181* 175* 168*  BUN 10 12 13 13 12   CREATININE 1.38* 1.57* 1.71* 1.64* 1.53*  CALCIUM 9.1 8.8* 8.8* 8.6* 8.5*  MG  --  1.7  --  2.3  --     Liver Function Tests: Recent Labs  Lab 05/11/21 1342 05/11/21 1415 05/12/21 1019  AST 25 30 23   ALT 16 15 14   ALKPHOS 49 48 52  BILITOT 0.6 0.7 0.7  PROT 6.5 6.8 6.9  ALBUMIN 3.3* 3.5 3.4*    CBG: Recent Labs  Lab 05/14/21 0642 05/14/21 1210 05/14/21 1625 05/14/21 2202 05/15/21 0626  GLUCAP 171* 164* 174* 160* 155*      Lipid Profile Recent Labs    05/13/21 0223  CHOL 192  HDL 26*  LDLCALC UNABLE TO CALCULATE IF TRIGLYCERIDE OVER 400 mg/dL  TRIG 619*  CHOLHDL 7.4  LDLDIRECT 60.9    Urinalysis    Component Value Date/Time   COLORURINE YELLOW 05/11/2021 1610   APPEARANCEUR CLEAR 05/11/2021 1610   LABSPEC 1.011 05/11/2021 1610   PHURINE 6.0 05/11/2021 1610   GLUCOSEU >=500 (A) 05/11/2021 1610   HGBUR NEGATIVE 05/11/2021 1610   BILIRUBINUR NEGATIVE 05/11/2021 1610   KETONESUR NEGATIVE  05/11/2021 [place] 05/11/2021 1610   NITRITE NEGATIVE 05/11/2021 [place] 05/11/2021 1610    I discussed in detail with 1 of patient's sons via phone, updated care and answered all questions.  He was appreciative of the call.  Time coordinating discharge: 40 minutes  SIGNED:  Vernell Leep, MD, [place], [facility]. [facility]  To contact the attending provider between 7A-7P or the covering provider during after hours 7P-7A, please log into the web site [email or web address]  and access using universal [place] password for that web site. If you do not have the password, please call the hospital operator.

## 2021-05-15 NOTE — Progress Notes (Signed)
Patient was approved to be transported by his daughter to SNF, since the family did not want to wait for [facility] to transport. Social worker approved transport by family, IV removed. Patient assisted in dressing and wheeled to daughters car. Report was called to [place].

## 2021-05-15 NOTE — Care Management Important Message (Signed)
Important Message  Patient Details  Name: AVRAHAM BENISH MRN: [id number] Date of Birth: 1935/01/26   Medicare Important Message Given:  Yes - Important Message mailed due to current National Emergency  Verbal consent obtained due to current National Emergency  Relationship to patient: Self Contact Name: Jeferson Call Date: 05/15/21  Time: 7035 Phone: [phone] Outcome: No Answer/Busy Important Message mailed to: Patient address on file    Delorse Lek 05/15/2021, 10:45 AM

## 2021-07-02 ENCOUNTER — Inpatient Hospital Stay: Payer: Medicare Other | Admitting: Adult Health

## 2021-08-15 DIAGNOSIS — F01A3 Vascular dementia, mild, with mood disturbance: Secondary | ICD-10-CM | POA: Diagnosis present

## 2021-08-21 NOTE — Progress Notes (Signed)
08/24/21- 86 yoM never smoker for sleep evaluation courtesy of [facility] with concern of OSA Medical problem list includes CVA, PAFib, CAD/MI, DM2, CKD3, Anemia, Hyperlipidemia,  NPSG 07/23/14- AHI 21.2/ hr, desaturation to 91%, body weight 190 lbs, CPAP to 10 Epworth score-15 Body weight today-207 lbs Covid vax- Flu vax- Son here and patient looks to him to remember discussion, being frank about memory loss. Patient feels he sleeps well with short latency, no sleep meds. ENT + tonsils/ adenoids. Doesn't remember experience with CPAP machine after 2015 study- it got lost in moves.  Son indicates awareness of daytime sleepiness.  Lives at [facility].  Prior to Admission medications   Medication Sig Start Date End Date Taking? Authorizing Provider  acetaminophen (TYLENOL) 325 MG tablet Take 2 tablets (650 mg total) by mouth every 6 (six) hours as needed for mild pain, moderate pain or headache. 05/15/21  Yes Hongalgi, Lenis Dickinson, MD  amLODipine (NORVASC) 5 MG tablet Take 1 tablet (5 mg total) by mouth daily. 05/16/21  Yes Hongalgi, Lenis Dickinson, MD  apixaban (ELIQUIS) 2.5 MG TABS tablet Take 1 tablet (2.5 mg total) by mouth 2 (two) times daily. Increase to 5 Mg 2 times daily when creatinine less than 1.5 on follow-up BMP check. 05/15/21  Yes Hongalgi, Lenis Dickinson, MD  Ascorbic Acid (VITAMIN C) 1000 MG tablet Take 1,000 mg by mouth daily.   Yes [provider]  B Complex-C (SUPER B-COMPLEX + VITAMIN C PO) Take 1 tablet by mouth daily. uper b complex + c 166m tablet 04/21/21  Yes [provider]  Carboxymethylcellulose Sod PF 1 % GEL Apply 1 application to eye 3 (three) times daily as needed (dry eyes).   Yes [provider]  Cholecalciferol (VITAMIN D-3) 125 MCG (5000 UT) TABS Take 5,000 Units by mouth daily.   Yes [provider]  clopidogrel (PLAVIX) 75 MG tablet Take 75 mg by mouth daily.   Yes [provider]  EPINEPHrine 0.3 mg/0.3 mL IJ SOAJ injection Inject  0.3 mg into the muscle once as needed (severe allergic reaction).   Yes [provider]  fenofibrate 160 MG tablet Take 1 tablet (160 mg total) by mouth daily. 05/15/21  Yes Hongalgi, ALenis Dickinson MD  insulin detemir (LEVEMIR) 100 UNIT/ML injection Inject 0.05 mLs (5 Units total) into the skin at bedtime. 05/15/21  Yes Hongalgi, ALenis Dickinson MD  loperamide (IMODIUM) 2 MG capsule Take 1 capsule (2 mg total) by mouth every 6 (six) hours as needed for diarrhea or loose stools. 05/15/21  Yes Hongalgi, ALenis Dickinson MD  loratadine (CLARITIN) 10 MG tablet Take 10 mg by mouth daily.   Yes [provider]  memantine (NAMENDA) 5 MG tablet Take 5 mg by mouth daily.   Yes [provider]  metFORMIN (GLUCOPHAGE) 1000 MG tablet Take 1,000 mg by mouth 2 (two) times daily with a meal. 07/05/16  Yes [provider]  metoprolol tartrate (LOPRESSOR) 50 MG tablet Take 1 tablet (50 mg total) by mouth 2 (two) times daily. 05/15/21  Yes Hongalgi, ALenis Dickinson MD  naphazoline-pheniramine (VISINE-A) 0.025-0.3 % ophthalmic solution Place 1 drop into both eyes 3 (three) times daily as needed for eye irritation.   Yes [provider]  nitroGLYCERIN (NITROSTAT) 0.4 MG SL tablet Place 0.4 mg under the tongue every 5 (five) minutes as needed for chest pain.   Yes [provider]  Omega-3 Fatty Acids (ULTRA OMEGA 3 PO) Take 1-2 capsules by mouth See admin instructions. Ultra  omega-3 529m-1000mg capsule 1 capsule in the morning, 2 capsules in the evening   Yes [provider]  pantoprazole (PROTONIX) 20 MG tablet Take 20 mg by mouth daily.   Yes [provider]  Probiotic Product (PROBIOTIC PO) Take 1 capsule by mouth daily. Probiotic 3 billion cell capsule   Yes [provider]  QC CALCIUM 500MG-D3 500-200 MG-UNIT TABS Take 1 tablet by mouth daily. 04/10/21  Yes [provider]  Saw Palmetto 450 MG CAPS Take 450 mg by mouth daily.   Yes [provider]   sitaGLIPtin (JANUVIA) 25 MG tablet Take 25 mg by mouth daily.   Yes [provider]  Zinc 23 MG LOZG Take 23 mg by mouth daily. 10/13/19  Yes [provider]   Past Medical History:  Diagnosis Date   [facility]-fib ([facility]    Anemia    Angina pectoris ([facility]    Arthritis    Carpal tunnel syndrome    Cobalamin deficiency    Coronary artery disease    Diabetes mellitus without complication (HCC)    Eczema    Hyperlipemia    Hypertension    Kidney disease, chronic, stage III (moderate, EGFR 30-59 ml/min) (HCC)    Lung infiltrate    Myocardial infarct, old    Prostatic hypertrophy    Sleep apnea, obstructive    Squamous cell cancer of lip    Past Surgical History:  Procedure Laterality Date   CORONARY ANGIOPLASTY WITH STENT PLACEMENT     Family History  Problem Relation Age of Onset   Hypertension Other    Social History   Socioeconomic History   Marital status: Married    Spouse name: Not on file   Number of children: Not on file   Years of education: Not on file   Highest education level: Not on file  Occupational History   Not on file  Tobacco Use   Smoking status: Never   Smokeless tobacco: Never  Substance and Sexual Activity   Alcohol use: No   Drug use: Not on file   Sexual activity: Not on file  Other Topics Concern   Not on file  Social History Narrative   Not on file   Social Determinants of Health   Financial Resource Strain: Not on file  Food Insecurity: Not on file  Transportation Needs: Not on file  Physical Activity: Not on file  Stress: Not on file  Social Connections: Not on file  Intimate Partner Violence: Not on file   ROS-see HPI   + = positivve Constitutional:    weight loss, night sweats, fevers, chills, fatigue, lassitude. HEENT:    headaches, difficulty swallowing, tooth/dental problems, sore throat,       sneezing, itching, ear ache, nasal congestion, post nasal drip, snoring CV:    chest pain, orthopnea, PND, swelling in  lower extremities, anasarca,                                   dizziness, palpitations Resp:   shortness of breath with exertion or at rest.                productive cough,   non-productive cough, coughing up of blood.              change in color of mucus.  wheezing.   Skin:    rash or lesions. GI:  No-   heartburn, indigestion,  abdominal pain, nausea, vomiting, diarrhea,                 change in bowel habits, loss of appetite GU: dysuria, change in color of urine, no urgency or frequency.   flank pain. MS:   joint pain, stiffness, decreased range of motion, back pain. Neuro-     nothing unusual Psych:  change in mood or affect.  depression or anxiety.   memory loss.  OBJ- Physical Exam General- Alert, Oriented, Affect-appropriate, Distress- none acute, + power wheelchair Skin- rash-none, lesions- none, excoriation- none Lymphadenopathy- none Head- atraumatic            Eyes- Gross vision intact, PERRLA, conjunctivae and secretions clear            Ears- Hearing, canals-normal            Nose- Clear, no-Septal dev, mucus, polyps, erosion, perforation             Throat- Mallampati III-IV , mucosa clear , drainage- none, tonsils- atrophic, + teeth Neck- flexible , trachea midline, no stridor , thyroid nl, carotid no bruit Chest - symmetrical excursion , unlabored           Heart/CV- RRR , no murmur , no gallop  , no rub, nl s1 s2                           - JVD- none , edema- none, stasis changes- none, varices- none           Lung- clear to P&A, wheeze- none, cough- none , dullness-none, rub- none           Chest wall-  Abd-  Br/ Gen/ Rectal- Not done, not indicated Extrem- cyanosis- none, clubbing, none, atrophy- none, strength- nl Neuro- grossly intact to observation

## 2021-08-24 ENCOUNTER — Other Ambulatory Visit: Payer: Self-pay

## 2021-08-24 ENCOUNTER — Ambulatory Visit: Payer: Medicare Other | Admitting: Internal Medicine

## 2021-08-24 ENCOUNTER — Encounter: Payer: Self-pay | Admitting: Internal Medicine

## 2021-08-24 VITALS — BP 158/82 | HR 76 | Temp 97.9°F | Ht 69.0 in | Wt 207.4 lb

## 2021-08-24 DIAGNOSIS — G4733 Obstructive sleep apnea (adult) (pediatric): Secondary | ICD-10-CM | POA: Diagnosis not present

## 2021-08-24 DIAGNOSIS — I480 Paroxysmal atrial fibrillation: Secondary | ICD-10-CM

## 2021-08-24 NOTE — Patient Instructions (Signed)
Order- schedule home sleep test    dx OSA  Please call us for results and recommendations about 2 weeks after your sleep test.

## 2021-08-24 NOTE — Assessment & Plan Note (Signed)
Followed by Cardiology 

## 2021-08-24 NOTE — Assessment & Plan Note (Signed)
Likely still has OSA. Will need to update documentation. Discussed treatment options but if appropriate, CPAP again likely first choice. Discussed home sleep test, which would be done at [facility].

## 2021-10-20 ENCOUNTER — Telehealth: Payer: Self-pay

## 2021-10-20 NOTE — Telephone Encounter (Signed)
Lm for patient.  

## 2021-10-20 NOTE — Telephone Encounter (Signed)
Spoke to patient's son, Bob(DPR), Mikki Santee stated that patient has not had sleep study yet. He would like to reschedule 10/21/2021 visit.   PCC's, can you guys provide update on sleep study. Order was placed 08/24/21. Can you let triage know when HST is scheduled so we can reschedule OV. Thanks.

## 2021-10-20 NOTE — Telephone Encounter (Signed)
ATC LVMTCB r/t has his sleep study been completed or not.

## 2021-10-20 NOTE — Telephone Encounter (Signed)
It's at least 10 weeks out from being order to be scheduled.

## 2021-10-20 NOTE — Telephone Encounter (Signed)
Mikki Santee son is returning phone call. Mikki Santee phone number is [phone].

## 2021-10-20 NOTE — Telephone Encounter (Signed)
Mikki Santee son states patient has not had home sleep test. Mikki Santee phone number is [phone].

## 2021-10-21 ENCOUNTER — Ambulatory Visit: Payer: Medicare Other | Admitting: Internal Medicine

## 2021-10-21 NOTE — Telephone Encounter (Signed)
ATC Duane [name] to update on time frame for HST. LMTCB with his work.   Routing message to Dr. Annamaria Boots as FYI so Dr. Annamaria Boots is aware of time frame.

## 2021-11-17 NOTE — Telephone Encounter (Signed)
I called pt's primary # & left vm.  I called cell & spoke to pt to schedule hst and pt asked me to speak to his dtr Angie who was there.  Angie states pt has short term memory loss.  She states someone could stay with him to help with study but if he needs cpap he would not be able to do it.  She states pt gets up at night to go to bathroom and can't remember how to get back to bed.  She thinks study would be waste of time.  I told her I would send message to [facility] and make him aware.

## 2023-02-01 ENCOUNTER — Emergency Department (HOSPITAL_COMMUNITY): Payer: Medicare Other

## 2023-02-01 ENCOUNTER — Encounter (HOSPITAL_COMMUNITY): Payer: Self-pay

## 2023-02-01 ENCOUNTER — Other Ambulatory Visit: Payer: Self-pay

## 2023-02-01 ENCOUNTER — Inpatient Hospital Stay (HOSPITAL_COMMUNITY)
Admission: EM | Admit: 2023-02-01 | Discharge: 2023-02-10 | DRG: 471 | Disposition: A | Discharge status: Skilled Nursing Facility | Payer: Medicare Other | Source: Skilled Nursing Facility | Attending: Neurological Surgery | Admitting: Neurological Surgery

## 2023-02-01 ENCOUNTER — Emergency Department (HOSPITAL_COMMUNITY): Payer: Medicare Other | Admitting: Anesthesiology

## 2023-02-01 ENCOUNTER — Encounter (HOSPITAL_COMMUNITY)
Admission: EM | Disposition: A | Discharge status: Skilled Nursing Facility | Payer: Self-pay | Source: Skilled Nursing Facility | Attending: Neurological Surgery

## 2023-02-01 DIAGNOSIS — R338 Other retention of urine: Secondary | ICD-10-CM | POA: Diagnosis not present

## 2023-02-01 DIAGNOSIS — I10 Essential (primary) hypertension: Secondary | ICD-10-CM | POA: Diagnosis present

## 2023-02-01 DIAGNOSIS — I252 Old myocardial infarction: Secondary | ICD-10-CM

## 2023-02-01 DIAGNOSIS — S51011A Laceration without foreign body of right elbow, initial encounter: Secondary | ICD-10-CM | POA: Diagnosis present

## 2023-02-01 DIAGNOSIS — I129 Hypertensive chronic kidney disease with stage 1 through stage 4 chronic kidney disease, or unspecified chronic kidney disease: Secondary | ICD-10-CM

## 2023-02-01 DIAGNOSIS — E119 Type 2 diabetes mellitus without complications: Secondary | ICD-10-CM

## 2023-02-01 DIAGNOSIS — N1832 Chronic kidney disease, stage 3b: Secondary | ICD-10-CM | POA: Diagnosis present

## 2023-02-01 DIAGNOSIS — Z419 Encounter for procedure for purposes other than remedying health state, unspecified: Secondary | ICD-10-CM

## 2023-02-01 DIAGNOSIS — W1830XA Fall on same level, unspecified, initial encounter: Secondary | ICD-10-CM | POA: Diagnosis present

## 2023-02-01 DIAGNOSIS — S12600A Unspecified displaced fracture of seventh cervical vertebra, initial encounter for closed fracture: Secondary | ICD-10-CM | POA: Diagnosis present

## 2023-02-01 DIAGNOSIS — E538 Deficiency of other specified B group vitamins: Secondary | ICD-10-CM | POA: Diagnosis present

## 2023-02-01 DIAGNOSIS — S12601A Unspecified nondisplaced fracture of seventh cervical vertebra, initial encounter for closed fracture: Secondary | ICD-10-CM | POA: Diagnosis not present

## 2023-02-01 DIAGNOSIS — Z7901 Long term (current) use of anticoagulants: Secondary | ICD-10-CM

## 2023-02-01 DIAGNOSIS — E1122 Type 2 diabetes mellitus with diabetic chronic kidney disease: Secondary | ICD-10-CM | POA: Diagnosis present

## 2023-02-01 DIAGNOSIS — F05 Delirium due to known physiological condition: Secondary | ICD-10-CM | POA: Diagnosis not present

## 2023-02-01 DIAGNOSIS — G4733 Obstructive sleep apnea (adult) (pediatric): Secondary | ICD-10-CM | POA: Diagnosis present

## 2023-02-01 DIAGNOSIS — D6832 Hemorrhagic disorder due to extrinsic circulating anticoagulants: Secondary | ICD-10-CM | POA: Diagnosis not present

## 2023-02-01 DIAGNOSIS — Z794 Long term (current) use of insulin: Secondary | ICD-10-CM

## 2023-02-01 DIAGNOSIS — Z7984 Long term (current) use of oral hypoglycemic drugs: Secondary | ICD-10-CM

## 2023-02-01 DIAGNOSIS — Z960 Presence of urogenital implants: Secondary | ICD-10-CM | POA: Diagnosis present

## 2023-02-01 DIAGNOSIS — I2510 Atherosclerotic heart disease of native coronary artery without angina pectoris: Secondary | ICD-10-CM | POA: Diagnosis present

## 2023-02-01 DIAGNOSIS — Z955 Presence of coronary angioplasty implant and graft: Secondary | ICD-10-CM

## 2023-02-01 DIAGNOSIS — E1159 Type 2 diabetes mellitus with other circulatory complications: Secondary | ICD-10-CM | POA: Diagnosis not present

## 2023-02-01 DIAGNOSIS — K219 Gastro-esophageal reflux disease without esophagitis: Secondary | ICD-10-CM | POA: Diagnosis present

## 2023-02-01 DIAGNOSIS — E1142 Type 2 diabetes mellitus with diabetic polyneuropathy: Secondary | ICD-10-CM | POA: Diagnosis present

## 2023-02-01 DIAGNOSIS — I480 Paroxysmal atrial fibrillation: Secondary | ICD-10-CM | POA: Diagnosis present

## 2023-02-01 DIAGNOSIS — R4182 Altered mental status, unspecified: Secondary | ICD-10-CM

## 2023-02-01 DIAGNOSIS — Z79899 Other long term (current) drug therapy: Secondary | ICD-10-CM

## 2023-02-01 DIAGNOSIS — E785 Hyperlipidemia, unspecified: Secondary | ICD-10-CM | POA: Diagnosis present

## 2023-02-01 DIAGNOSIS — M1990 Unspecified osteoarthritis, unspecified site: Secondary | ICD-10-CM | POA: Diagnosis present

## 2023-02-01 DIAGNOSIS — S134XXA Sprain of ligaments of cervical spine, initial encounter: Secondary | ICD-10-CM | POA: Diagnosis present

## 2023-02-01 DIAGNOSIS — T45515A Adverse effect of anticoagulants, initial encounter: Secondary | ICD-10-CM | POA: Diagnosis present

## 2023-02-01 DIAGNOSIS — E871 Hypo-osmolality and hyponatremia: Secondary | ICD-10-CM | POA: Diagnosis not present

## 2023-02-01 DIAGNOSIS — F0390 Unspecified dementia without behavioral disturbance: Secondary | ICD-10-CM | POA: Diagnosis present

## 2023-02-01 DIAGNOSIS — S12501A Unspecified nondisplaced fracture of sixth cervical vertebra, initial encounter for closed fracture: Secondary | ICD-10-CM | POA: Diagnosis present

## 2023-02-01 DIAGNOSIS — Z8249 Family history of ischemic heart disease and other diseases of the circulatory system: Secondary | ICD-10-CM

## 2023-02-01 DIAGNOSIS — N189 Chronic kidney disease, unspecified: Secondary | ICD-10-CM

## 2023-02-01 DIAGNOSIS — D631 Anemia in chronic kidney disease: Secondary | ICD-10-CM | POA: Diagnosis not present

## 2023-02-01 DIAGNOSIS — Z9889 Other specified postprocedural states: Secondary | ICD-10-CM | POA: Diagnosis not present

## 2023-02-01 DIAGNOSIS — E1165 Type 2 diabetes mellitus with hyperglycemia: Secondary | ICD-10-CM | POA: Diagnosis present

## 2023-02-01 DIAGNOSIS — S129XXA Fracture of neck, unspecified, initial encounter: Secondary | ICD-10-CM | POA: Diagnosis present

## 2023-02-01 DIAGNOSIS — S064XAA Epidural hemorrhage with loss of consciousness status unknown, initial encounter: Secondary | ICD-10-CM | POA: Diagnosis present

## 2023-02-01 DIAGNOSIS — N183 Chronic kidney disease, stage 3 unspecified: Secondary | ICD-10-CM | POA: Diagnosis present

## 2023-02-01 DIAGNOSIS — Z8673 Personal history of transient ischemic attack (TIA), and cerebral infarction without residual deficits: Secondary | ICD-10-CM

## 2023-02-01 DIAGNOSIS — N320 Bladder-neck obstruction: Secondary | ICD-10-CM | POA: Diagnosis not present

## 2023-02-01 DIAGNOSIS — N179 Acute kidney failure, unspecified: Secondary | ICD-10-CM | POA: Diagnosis not present

## 2023-02-01 DIAGNOSIS — Z888 Allergy status to other drugs, medicaments and biological substances status: Secondary | ICD-10-CM

## 2023-02-01 DIAGNOSIS — Z85819 Personal history of malignant neoplasm of unspecified site of lip, oral cavity, and pharynx: Secondary | ICD-10-CM

## 2023-02-01 DIAGNOSIS — Z7902 Long term (current) use of antithrombotics/antiplatelets: Secondary | ICD-10-CM

## 2023-02-01 DIAGNOSIS — Z91030 Bee allergy status: Secondary | ICD-10-CM

## 2023-02-01 DIAGNOSIS — Z91041 Radiographic dye allergy status: Secondary | ICD-10-CM

## 2023-02-01 DIAGNOSIS — G9519 Other vascular myelopathies: Secondary | ICD-10-CM | POA: Diagnosis present

## 2023-02-01 HISTORY — PX: POSTERIOR CERVICAL FUSION/FORAMINOTOMY: SHX5038

## 2023-02-01 LAB — CBC WITH DIFFERENTIAL/PLATELET
Abs Immature Granulocytes: 0.15 10*3/uL — ABNORMAL HIGH (ref 0.00–0.07)
Basophils Absolute: 0.1 10*3/uL (ref 0.0–0.1)
Basophils Relative: 0 %
Eosinophils Absolute: 0.3 10*3/uL (ref 0.0–0.5)
Eosinophils Relative: 1 %
HCT: 34.6 % — ABNORMAL LOW (ref 39.0–52.0)
Hemoglobin: 11.1 g/dL — ABNORMAL LOW (ref 13.0–17.0)
Immature Granulocytes: 1 %
Lymphocytes Relative: 18 %
Lymphs Abs: 3.7 10*3/uL (ref 0.7–4.0)
MCH: 25.5 pg — ABNORMAL LOW (ref 26.0–34.0)
MCHC: 32.1 g/dL (ref 30.0–36.0)
MCV: 79.4 fL — ABNORMAL LOW (ref 80.0–100.0)
Monocytes Absolute: 1.1 10*3/uL — ABNORMAL HIGH (ref 0.1–1.0)
Monocytes Relative: 6 %
Neutro Abs: 15.3 10*3/uL — ABNORMAL HIGH (ref 1.7–7.7)
Neutrophils Relative %: 74 %
Platelets: 255 10*3/uL (ref 150–400)
RBC: 4.36 MIL/uL (ref 4.22–5.81)
RDW: 15.1 % (ref 11.5–15.5)
WBC: 20.6 10*3/uL — ABNORMAL HIGH (ref 4.0–10.5)
nRBC: 0 % (ref 0.0–0.2)

## 2023-02-01 LAB — PROTIME-INR
INR: 1.3 — ABNORMAL HIGH (ref 0.8–1.2)
Prothrombin Time: 15.6 seconds — ABNORMAL HIGH (ref 11.4–15.2)

## 2023-02-01 LAB — COMPREHENSIVE METABOLIC PANEL
ALT: 17 U/L (ref 0–44)
AST: 39 U/L (ref 15–41)
Albumin: 3.8 g/dL (ref 3.5–5.0)
Alkaline Phosphatase: 50 U/L (ref 38–126)
Anion gap: 11 (ref 5–15)
BUN: 19 mg/dL (ref 8–23)
CO2: 21 mmol/L — ABNORMAL LOW (ref 22–32)
Calcium: 9 mg/dL (ref 8.9–10.3)
Chloride: 102 mmol/L (ref 98–111)
Creatinine, Ser: 2.16 mg/dL — ABNORMAL HIGH (ref 0.61–1.24)
GFR, Estimated: 29 mL/min — ABNORMAL LOW (ref >60)
Glucose, Bld: 212 mg/dL — ABNORMAL HIGH (ref 70–99)
Potassium: 3.9 mmol/L (ref 3.5–5.1)
Sodium: 134 mmol/L — ABNORMAL LOW (ref 135–145)
Total Bilirubin: 0.5 mg/dL (ref 0.3–1.2)
Total Protein: 7.3 g/dL (ref 6.5–8.1)

## 2023-02-01 LAB — APTT: aPTT: 40 seconds — ABNORMAL HIGH (ref 24–36)

## 2023-02-01 SURGERY — POSTERIOR CERVICAL FUSION/FORAMINOTOMY LEVEL 3
Anesthesia: General | Site: Neck

## 2023-02-01 MED ORDER — PROPOFOL 10 MG/ML IV BOLUS
INTRAVENOUS | Status: AC
  Filled 2023-02-01: qty 20

## 2023-02-01 MED ORDER — LIDOCAINE 2% (20 MG/ML) 5 ML SYRINGE
INTRAMUSCULAR | Status: DC | PRN
  Administered 2023-02-01: 60 mg via INTRAVENOUS

## 2023-02-01 MED ORDER — ONDANSETRON HCL 4 MG/2ML IJ SOLN
INTRAMUSCULAR | Status: AC
  Filled 2023-02-01: qty 2

## 2023-02-01 MED ORDER — ROCURONIUM BROMIDE 10 MG/ML (PF) SYRINGE
PREFILLED_SYRINGE | INTRAVENOUS | Status: DC | PRN
  Administered 2023-02-01: 60 mg via INTRAVENOUS
  Administered 2023-02-02 (×2): 20 mg via INTRAVENOUS

## 2023-02-01 MED ORDER — EPHEDRINE SULFATE-NACL 50-0.9 MG/10ML-% IV SOSY
PREFILLED_SYRINGE | INTRAVENOUS | Status: DC | PRN
  Administered 2023-02-01 (×2): 5 mg via INTRAVENOUS

## 2023-02-01 MED ORDER — DEXAMETHASONE SODIUM PHOSPHATE 10 MG/ML IJ SOLN
INTRAMUSCULAR | Status: AC
  Filled 2023-02-01: qty 1

## 2023-02-01 MED ORDER — LIDOCAINE 2% (20 MG/ML) 5 ML SYRINGE
INTRAMUSCULAR | Status: AC
  Filled 2023-02-01: qty 5

## 2023-02-01 MED ORDER — ROCURONIUM BROMIDE 10 MG/ML (PF) SYRINGE
PREFILLED_SYRINGE | INTRAVENOUS | Status: AC
  Filled 2023-02-01: qty 10

## 2023-02-01 MED ORDER — FENTANYL CITRATE (PF) 250 MCG/5ML IJ SOLN
INTRAMUSCULAR | Status: DC | PRN
  Administered 2023-02-01 – 2023-02-02 (×5): 50 ug via INTRAVENOUS

## 2023-02-01 MED ORDER — THROMBIN 5000 UNITS EX SOLR
CUTANEOUS | Status: AC
  Filled 2023-02-01: qty 5000

## 2023-02-01 MED ORDER — FENTANYL CITRATE (PF) 250 MCG/5ML IJ SOLN
INTRAMUSCULAR | Status: AC
  Filled 2023-02-01: qty 5

## 2023-02-01 MED ORDER — THROMBIN 5000 UNITS EX SOLR
OROMUCOSAL | Status: DC | PRN
  Administered 2023-02-01: 5 mL via TOPICAL

## 2023-02-01 MED ORDER — PHENYLEPHRINE HCL-NACL 20-0.9 MG/250ML-% IV SOLN
INTRAVENOUS | Status: DC | PRN
  Administered 2023-02-01: 40 ug/min via INTRAVENOUS

## 2023-02-01 MED ORDER — PROTHROMBIN COMPLEX CONC HUMAN 500 UNITS IV KIT
4321.0000 [IU] | PACK | Status: AC
  Administered 2023-02-01: 4321 [IU] via INTRAVENOUS
  Filled 2023-02-01: qty 4321

## 2023-02-01 MED ORDER — BACITRACIN ZINC 500 UNIT/GM EX OINT
TOPICAL_OINTMENT | CUTANEOUS | Status: AC
  Filled 2023-02-01: qty 28.35

## 2023-02-01 MED ORDER — LIDOCAINE-EPINEPHRINE 1 %-1:100000 IJ SOLN
INTRAMUSCULAR | Status: AC
  Filled 2023-02-01: qty 1

## 2023-02-01 MED ORDER — ONDANSETRON HCL 4 MG/2ML IJ SOLN
4.0000 mg | Freq: Once | INTRAMUSCULAR | Status: AC
  Administered 2023-02-01: 4 mg via INTRAVENOUS
  Filled 2023-02-01: qty 2

## 2023-02-01 MED ORDER — SODIUM CHLORIDE 0.9% IV SOLUTION: Freq: Once | INTRAVENOUS | Status: DC

## 2023-02-01 MED ORDER — LACTATED RINGERS IV SOLN: INTRAVENOUS | Status: DC | PRN

## 2023-02-01 MED ORDER — MORPHINE SULFATE (PF) 4 MG/ML IV SOLN
4.0000 mg | Freq: Once | INTRAVENOUS | Status: AC
  Administered 2023-02-01: 4 mg via INTRAVENOUS
  Filled 2023-02-01: qty 1

## 2023-02-01 MED ORDER — CEFAZOLIN SODIUM-DEXTROSE 2-3 GM-%(50ML) IV SOLR
INTRAVENOUS | Status: DC | PRN
  Administered 2023-02-01: 2 g via INTRAVENOUS

## 2023-02-01 MED ORDER — 0.9 % SODIUM CHLORIDE (POUR BTL) OPTIME
TOPICAL | Status: DC | PRN
  Administered 2023-02-01: 1000 mL

## 2023-02-01 MED ORDER — PROPOFOL 10 MG/ML IV BOLUS
INTRAVENOUS | Status: DC | PRN
  Administered 2023-02-01: 40 mg via INTRAVENOUS
  Administered 2023-02-01: 110 mg via INTRAVENOUS

## 2023-02-01 MED ORDER — PHENYLEPHRINE 80 MCG/ML (10ML) SYRINGE FOR IV PUSH (FOR BLOOD PRESSURE SUPPORT)
PREFILLED_SYRINGE | INTRAVENOUS | Status: DC | PRN
  Administered 2023-02-01 (×2): 160 ug via INTRAVENOUS

## 2023-02-01 MED ORDER — BACITRACIN ZINC 500 UNIT/GM EX OINT
TOPICAL_OINTMENT | CUTANEOUS | Status: DC | PRN
  Administered 2023-02-01: 1 via TOPICAL

## 2023-02-01 MED ORDER — LIDOCAINE-EPINEPHRINE 1 %-1:100000 IJ SOLN
INTRAMUSCULAR | Status: DC | PRN
  Administered 2023-02-01: 7 mL via INTRADERMAL

## 2023-02-01 SURGICAL SUPPLY — 65 items
ADH SKN CLS APL DERMABOND .7 (GAUZE/BANDAGES/DRESSINGS) ×1
ADH SKN CLS LQ APL DERMABOND (GAUZE/BANDAGES/DRESSINGS) ×1
APL SKNCLS STERI-STRIP NONHPOA (GAUZE/BANDAGES/DRESSINGS)
BAG COUNTER SPONGE SURGICOUNT (BAG) ×1 IMPLANT
BAG SPNG CNTER NS LX DISP (BAG) ×1
BENZOIN TINCTURE PRP APPL 2/3 (GAUZE/BANDAGES/DRESSINGS) IMPLANT
BIT DRILL 2.4 (BIT) IMPLANT
BLADE BONE MILL MEDIUM (MISCELLANEOUS) IMPLANT
BLADE CLIPPER SURG (BLADE) ×1 IMPLANT
BUR MATCHSTICK NEURO 3.0 LAGG (BURR) IMPLANT
BUR PRECISION FLUTE 5.0 (BURR) ×1 IMPLANT
CANISTER SUCT 3000ML PPV (MISCELLANEOUS) ×1 IMPLANT
DERMABOND ADVANCED .7 DNX12 (GAUZE/BANDAGES/DRESSINGS) ×1 IMPLANT
DERMABOND ADVANCED .7 DNX6 (GAUZE/BANDAGES/DRESSINGS) IMPLANT
DRAPE C-ARM 42X72 X-RAY (DRAPES) ×2 IMPLANT
DRAPE LAPAROTOMY 100X72 PEDS (DRAPES) ×1 IMPLANT
DRAPE LAPAROTOMY 100X72X124 (DRAPES) IMPLANT
DRSG OPSITE POSTOP 3X4 (GAUZE/BANDAGES/DRESSINGS) IMPLANT
DURAPREP 6ML APPLICATOR 50/CS (WOUND CARE) ×1 IMPLANT
ELECT REM PT RETURN 9FT ADLT (ELECTROSURGICAL) ×1
ELECTRODE REM PT RTRN 9FT ADLT (ELECTROSURGICAL) ×1 IMPLANT
GAUZE 4X4 16PLY ~~LOC~~+RFID DBL (SPONGE) IMPLANT
GAUZE SPONGE 4X4 12PLY STRL (GAUZE/BANDAGES/DRESSINGS) IMPLANT
GLOVE BIO SURGEON STRL SZ 6.5 (GLOVE) IMPLANT
GLOVE BIO SURGEON STRL SZ7 (GLOVE) IMPLANT
GLOVE BIOGEL PI IND STRL 7.5 (GLOVE) ×1 IMPLANT
GLOVE ECLIPSE 7.5 STRL STRAW (GLOVE) ×1 IMPLANT
GLOVE EXAM NITRILE LRG STRL (GLOVE) IMPLANT
GLOVE EXAM NITRILE XL STR (GLOVE) IMPLANT
GLOVE EXAM NITRILE XS STR PU (GLOVE) IMPLANT
GOWN STRL REUS W/ TWL LRG LVL3 (GOWN DISPOSABLE) IMPLANT
GOWN STRL REUS W/ TWL XL LVL3 (GOWN DISPOSABLE) ×1 IMPLANT
GOWN STRL REUS W/TWL 2XL LVL3 (GOWN DISPOSABLE) IMPLANT
GOWN STRL REUS W/TWL LRG LVL3 (GOWN DISPOSABLE) ×3
GOWN STRL REUS W/TWL XL LVL3 (GOWN DISPOSABLE) ×1
HEMOSTAT POWDER KIT SURGIFOAM (HEMOSTASIS) ×1 IMPLANT
KIT BASIN OR (CUSTOM PROCEDURE TRAY) ×1 IMPLANT
KIT TURNOVER KIT B (KITS) ×1 IMPLANT
MARKER SKIN DUAL TIP RULER LAB (MISCELLANEOUS) IMPLANT
NEEDLE HYPO 22GX1.5 SAFETY (NEEDLE) ×1 IMPLANT
NS IRRIG 1000ML POUR BTL (IV SOLUTION) ×1 IMPLANT
PACK LAMINECTOMY NEURO (CUSTOM PROCEDURE TRAY) ×1 IMPLANT
PAD ARMBOARD 7.5X6 YLW CONV (MISCELLANEOUS) ×3 IMPLANT
PIN MAYFIELD SKULL DISP (PIN) ×1 IMPLANT
ROD PRECUT 3.5X50 (Rod) IMPLANT
SCREW MULTI AXIAL 3.5X14MM (Screw) IMPLANT
SCREW MULTI AXIAL 4.5X22 (Screw) IMPLANT
SCREW MULTI AXIAL 4.5X24 (Screw) IMPLANT
SET SCREW INFINITY IFIX THOR (Screw) IMPLANT
SOL ELECTROSURG ANTI STICK (MISCELLANEOUS) ×1
SOLUTION ELECTROSURG ANTI STCK (MISCELLANEOUS) ×1 IMPLANT
SPIKE FLUID TRANSFER (MISCELLANEOUS) ×1 IMPLANT
SPONGE T-LAP 4X18 ~~LOC~~+RFID (SPONGE) IMPLANT
STAPLER VISISTAT 35W (STAPLE) ×1 IMPLANT
SUT ETHILON 3 0 FSL (SUTURE) IMPLANT
SUT MNCRL AB 3-0 PS2 18 (SUTURE) ×1 IMPLANT
SUT MON AB 3-0 SH 27 (SUTURE)
SUT MON AB 3-0 SH27 (SUTURE) ×1 IMPLANT
SUT VIC AB 0 CT1 18XCR BRD8 (SUTURE) ×1 IMPLANT
SUT VIC AB 0 CT1 8-18 (SUTURE) ×1
SUT VIC AB 2-0 CP2 18 (SUTURE) ×1 IMPLANT
TOWEL GREEN STERILE (TOWEL DISPOSABLE) ×1 IMPLANT
TOWEL GREEN STERILE FF (TOWEL DISPOSABLE) ×1 IMPLANT
UNDERPAD 30X36 HEAVY ABSORB (UNDERPADS AND DIAPERS) ×1 IMPLANT
WATER STERILE IRR 1000ML POUR (IV SOLUTION) ×1 IMPLANT

## 2023-02-01 NOTE — Progress Notes (Signed)
EEG complete - results pending 

## 2023-02-01 NOTE — Progress Notes (Signed)
Pt seen, discussed w/ him and his family. Exam is now barely 3/5 in BLE, BUE 4-/5 and 3/5 proximally. Stat MRI is pending, suspect epidural, pt and family would like to proceed with surgery. Keep NPO, if MRI confirms diagnosis, will proceed with reversal and OR for hematoma evacuation.

## 2023-02-01 NOTE — Consult Note (Addendum)
Neurosurgery Consultation  Reason for Consult: Cervical fracture  Referring Physician: Dr. Maryan Rued  CC: bilateral shoulder pain  HPI: This is a 88 y.o. male with CAD s/p stents, diabetes, HTN, hyperlipidemia and paroxysmal atrial fibrillation on Eliquis. He presented today via EMS from a chiropractor's office. He is a resident at [facility]. He was at the chiropractor's office when they reported he suddenly started complaining of pain in his upper back and shoulders and noticed that his mouth was full of blood. Patient denies remembering any trauma, per his family he has short term memory issues. Nobody at the chiropractor's office noticed that he fell. Patient is complaining of just a very uncomfortable pain in both of his shoulders. No new weakness, numbness, or parasthesias, no recent change in bowel or bladder function. Per Dr. Maryan Rued the patient is neurologically intact with full strength and sensation.    ROS: A 14 point ROS was performed and is negative except as noted in the HPI.   PMHx:  Past Medical History:  Diagnosis Date   A-fib ([place])    Anemia    Angina pectoris (HCC)    Arthritis    Carpal tunnel syndrome    Cobalamin deficiency    Coronary artery disease    Diabetes mellitus without complication (HCC)    Eczema    Hyperlipemia    Hypertension    Kidney disease, chronic, stage III (moderate, EGFR 30-59 ml/min) (HCC)    Lung infiltrate    Myocardial infarct, old    Prostatic hypertrophy    Sleep apnea, obstructive    Squamous cell cancer of lip    FamHx:  Family History  Problem Relation Age of Onset   Hypertension Other    SocHx:  reports that he has never smoked. He has never used smokeless tobacco. He reports that he does not drink alcohol. No history on file for drug use.  Exam: Vital signs in last 24 hours: Temp:  [96.7 F (35.9 C)] 96.7 F (35.9 C) (03/19 1258) Pulse Rate:  [54-59] 59 (03/19 1330) Resp:  [15-18] 18 (03/19 1330) BP:  (113-153)/(54-65) 153/56 (03/19 1330) SpO2:  [96 %-100 %] 100 % (03/19 1330) Weight:  [90.7 kg] 90.7 kg (03/19 1302)   Assessment and Plan: 88 y.o. male with an acute nondisplaced unilateral left C6 lamina and facet fracture. Cervical CT personally reviewed, which shows a nondisplaced fracture of the left C6 lamina and medial lateral mass with extension to the left C6-C7 facet joint.   -no acute neurosurgical intervention indicated at this time -recommend hard collar at all times -follow-up as an outpatient in 3-4 weeks -please call with any concerns or questions  Collene Schlichter, PA-C 02/01/23 3:04 [facility]  Informed pt is now having some new weakness, rpt CT C-spine reviewed, difficult to say for sure given the modality but I do see a suggestion of an epidural tracking cranially in the dorsal epidural space. Will need stat MRI C-spine to evaluate further, will be by shortly to evaluate the patient, likely will need reversal and OR for evacuation of hematoma. Please keep NPO, will discuss with the patient then decide about reversal of his anticoagulants / antiplatelet agents.

## 2023-02-01 NOTE — ED Notes (Signed)
EEG at bedside.

## 2023-02-01 NOTE — Op Note (Signed)
PATIENT: Duane [name]  DAY OF SURGERY: 02/01/23   PRE-OPERATIVE DIAGNOSIS:  Cervical spine fracture with epidural hematoma   POST-OPERATIVE DIAGNOSIS:  Same   PROCEDURE:  C5-C7 laminectomies for evacuation of epidural hematoma, C5-T1 posterior instrumented fusion   SURGEON:  Surgeon(s) and Role:    Judith Part, MD   ANESTHESIA: ETGA   BRIEF HISTORY: This is an 88 year old man who presented with neck / back pain after unclear fall or circumstances. He was diagnosed with a 2 level facet fracture. He was on clopidogrel and apixiban prior to admission and, while still in the ED, had progressive decline in neurologic function, MRI confirmed a large cervical epidural hematoma as well as showed ligamentous injury with instability at the level of his fracture. We had a long discussion regarding how bad of a situation this is and he and his family wished to proceed with surgery to try and preserve his neurologic function, discussed he's very high risk of recurrence as well as other complications.    OPERATIVE DETAIL: The patient was taken to the operating room, anesthesia was induced by the anesthesia team, and the patient was placed on the OR table in the prone position using the [place] head holder. A formal time out was performed with two patient identifiers and confirmed the operative site. The operative site was marked, hair was clipped with surgical clippers, the area was then prepped and draped in a sterile fashion. A linear midline incision was placed in the mid-cervical region down to the C-T junction and fluoroscopy was used to confirm surgical level. Given his habitus and limitations of intra-op fluoro, I had to dissect cranially to place a clamp on C2 and C3 then count down to confirm levels. Laminectomies were performed with a combination of high speed drill and rongeurs at C5, C6, and C7 with evacuation of epidural hematoma. The ligament was torn at C6-7 and this is where the  hematoma was worse. The thecal sac was swollen either from cord edema or possible subdural, but I did not think that intradural exploration was best and performed a wide decompression. Hemostasis was difficult throughout the case, as expected given his preoperative medications. Hemostasis was obtained and lateral mass screws were placed in the usual fashion at C5 and C6. C7 and T1 pedicle screws were then placed using landmarks as well as fluoroscopic shots as needed with good purchase. The lateral mass and joint surfaces were decorticated and autograft was morselized and placed along those surfaces. Rods were placed into the tulip heads and secured with caps, which were torqued to specification. Final fluoroscopic images were obtained, drains were placed, the wound was copiously irrigated, all instrument and sponge counts were correct, and the incision was then closed in layers. The patient was then returned to anesthesia for emergence. No apparent complications at the completion of the procedure.   EBL:  476mL   DRAINS: Subfascial #7 flat drain with JP bulb   SPECIMENS: none   Judith Part, MD 02/01/23 9:30 PM

## 2023-02-01 NOTE — Anesthesia Preprocedure Evaluation (Addendum)
Anesthesia Evaluation  Patient identified by MRN, date of birth, ID band Patient awake    Reviewed: Allergy & Precautions, NPO status , Patient's Chart, lab work & pertinent test results  Airway Mallampati: III  TM Distance: >3 FB Neck ROM: Limited    Dental  (+) Dental Advisory Given   Pulmonary sleep apnea    breath sounds clear to auscultation       Cardiovascular hypertension, Pt. on medications + CAD and + Cardiac Stents   Rhythm:Regular Rate:Normal     Neuro/Psych  Neuromuscular disease    GI/Hepatic negative GI ROS, Neg liver ROS,,,  Endo/Other  diabetes, Type 2, Oral Hypoglycemic Agents, Insulin Dependent    Renal/GU Renal Insufficiency, CRF and ARFRenal disease     Musculoskeletal  (+) Arthritis ,    Abdominal   Peds  Hematology  (+) Blood dyscrasia, anemia   Anesthesia Other Findings   Reproductive/Obstetrics                             Anesthesia Physical Anesthesia Plan  ASA: 4 and emergent  Anesthesia Plan: General   Post-op Pain Management: Ofirmev IV (intra-op)*   Induction: Intravenous  PONV Risk Score and Plan: 2 and Dexamethasone, Ondansetron and Treatment may vary due to age or medical condition  Airway Management Planned: Oral ETT and Video Laryngoscope Planned  Additional Equipment: Arterial line  Intra-op Plan:   Post-operative Plan: Possible Post-op intubation/ventilation  Informed Consent: I have reviewed the patients History and Physical, chart, labs and discussed the procedure including the risks, benefits and alternatives for the proposed anesthesia with the patient or authorized representative who has indicated his/her understanding and acceptance.     Dental advisory given  Plan Discussed with: CRNA  Anesthesia Plan Comments:        Anesthesia Quick Evaluation

## 2023-02-01 NOTE — Progress Notes (Addendum)
MRI shows large epidural hematoma, ligamentous injury with some change in alignment, will need to go to OR for evacuation and stabilization, respectively. Will order reversal w/ [place], keep NPO, OR aware. For the clopidogrel, he has renal disease and is mildly hyponatremic, so DDAVP not a good option.

## 2023-02-01 NOTE — Anesthesia Procedure Notes (Signed)
Procedure Name: Intubation Date/Time: 02/01/2023 10:52 PM  Performed by: Rande Brunt, CRNAPre-anesthesia Checklist: Patient identified, Emergency Drugs available, Suction available and Patient being monitored Patient Re-evaluated:Patient Re-evaluated prior to induction Oxygen Delivery Method: Circle System Utilized Preoxygenation: Pre-oxygenation with 100% oxygen Induction Type: IV induction Ventilation: Mask ventilation without difficulty Laryngoscope Size: Glidescope and 4 Grade View: Grade I Tube type: Oral Tube size: 7.5 mm Number of attempts: 1 Airway Equipment and Method: Stylet and Oral airway Placement Confirmation: ETT inserted through vocal cords under direct vision, positive ETCO2 and breath sounds checked- equal and bilateral Secured at: 22 cm Tube secured with: Tape Dental Injury: Teeth and Oropharynx as per pre-operative assessment

## 2023-02-01 NOTE — ED Provider Notes (Signed)
3:39 PM Assumed care of patient from off-going team. For more details, please see note from same day.  In brief, this is a 88 y.o. male, lives in [place] but still high functioning. Were transporting him via transport vehicle, belted in. Driver states patient started complaining of pain. Got him out of vehicle, felt better, then went to his son's chiropractic office. On arrives, c/o severe pain in his shoulders/back, bleeding from his mouth, skin tear on left shoulder. Patient can't remember what happened but that's baseline for him. Unclear if fall or seizure, driver says no. +Tongue biting. +C6 fracture, nondisplaced, NSGY says collar at all times. WBC 20. Mentating at baseline now. EKG looked okay.  Plan/Dispo at time of sign-out & ED Course since sign-out: [ ]  neuro for EEG? Troponin pending. [ ]  medicine admit, PT   BP 111/60   Pulse 60   Temp (!) 96.7 F (35.9 C) (Axillary)   Resp 11   Ht 5\' 11"  (1.803 m)   Wt 90.7 kg   SpO2 96%   BMI 27.89 kg/m    ED Course:   Clinical Course as of 02/02/23 1648  Tue Feb 01, 2023  1700 D/w radiology, who states he is reviewing the original CT and may have superior articular process C7 as well. Worth getting MRI especially in s/o new neurologic symptoms. Will get C-spine CT and then  [HN]  1756 CT Head Wo Contrast Edited CTH result: On further review, possible additional nondisplaced fracture through the superior articular process of the C7 vertebral body bilaterally (for example series 8, images 20 and 41), although there is artifact through this region which limits assessment. Given these findings and the patient's reported neurologic symptoms, recommend MRI to better assess the canal and foramina and to better assess for ligamentous injury.  [HN]  1828 CT Cervical Spine Wo Contrast 1. Redemonstrated fractures of the right C6 lamina, left C6 pedicle, and bilateral C7 superior articular processes. 2. Suspect dorsal epidural hematoma extending  superiorly from the fractured level to the skull base. Further characterization with cervical spine MRI is again recommended.  [HN]  [id number] Will consult to neurosurgery [HN]  1854 NSGY planning on posterior cervical laminectomy on Mr. dejuane mathai [HN]  [id number] NSGY had requested hospitalist admission, hospitalist refuses admission. [HN]  1946 I requested that [facility] discuss with hospitalist service directly. [HN]  2017 Patient will be admitted to [place] Dr. Zada Finders. Discussed reversal of eliquis and will hold off until PACU. [HN]    Clinical Course User Index [HN] Audley Hose, MD    Dispo: Admit to [facility], OR tonight. Family updated. ------------------------------- Cindee Lame, MD Emergency Medicine  This note was created using dictation software, which may contain spelling or grammatical errors.   Audley Hose, MD 02/02/23 [phone]

## 2023-02-01 NOTE — Anesthesia Procedure Notes (Addendum)
Arterial Line Insertion Start/End3/19/2024 11:02 PM, 02/01/2023 11:05 PM Performed by: Junie Bame, RN  Patient location: OR. Preanesthetic checklist: patient identified, IV checked, site marked, risks and benefits discussed, surgical consent, monitors and equipment checked, pre-op evaluation, timeout performed and anesthesia consent Lidocaine 1% used for infiltration Right, radial was placed Catheter size: 20 G Hand hygiene performed  and maximum sterile barriers used   Attempts: 1 Procedure performed without using ultrasound guided technique. Following insertion, dressing applied and Biopatch. Post procedure assessment: normal and unchanged  Patient tolerated the procedure well with no immediate complications.

## 2023-02-01 NOTE — ED Provider Notes (Signed)
[facility] Provider Note   CSN: [id number] Arrival date & time: 02/01/23  1250     History  Chief Complaint  Patient presents with   Back Pain    Patient presents via ems from chiropractic office where he began having back pain. Patient has blood in mouth from biting tongue and a skin tear on right elbow, but denies fall/trauma. No injury reported by family. A&O x 2.     Duane [name] is a 88 y.o. male.  Patient is an 88 year old male with a history of CAD status post stents, diabetes, hypertension, hyperlipidemia and paroxysmal atrial fibrillation on Eliquis who is presenting today with EMS from a chiropractor's office.  He was at the chiropractor's office when they reported he suddenly started complaining of pain in his upper back and shoulders and noticed that his mouth was full of blood.  Patient denies remembering any trauma.  Nobody at the chiropractor's office noticed that he fell.  Patient is complaining of just a very uncomfortable pain in both of his shoulders.  He denies any chest pain or shortness of breath.  He denies abdominal pain.  He denies any numbness or weakness in his lower extremities.  He reports that he is not sure what happened but he is very thirsty.  Patient was given 100 mcg of fentanyl and route with minimal improvement.  The history is provided by the patient and medical records.  Back Pain      Home Medications Prior to Admission medications   Medication Sig Start Date End Date Taking? Authorizing Provider  acetaminophen (TYLENOL) 325 MG tablet Take 2 tablets (650 mg total) by mouth every 6 (six) hours as needed for mild pain, moderate pain or headache. 05/15/21   Hongalgi, Lenis Dickinson, MD  amLODipine (NORVASC) 5 MG tablet Take 1 tablet (5 mg total) by mouth daily. 05/16/21   Hongalgi, Lenis Dickinson, MD  apixaban (ELIQUIS) 2.5 MG TABS tablet Take 1 tablet (2.5 mg total) by mouth 2 (two) times daily. Increase to 5 Mg 2  times daily when creatinine less than 1.5 on follow-up BMP check. 05/15/21   Hongalgi, Lenis Dickinson, MD  Ascorbic Acid (VITAMIN C) 1000 MG tablet Take 1,000 mg by mouth daily.    [provider]  B Complex-C (SUPER B-COMPLEX + VITAMIN C PO) Take 1 tablet by mouth daily. uper b complex + c 150mg  tablet 04/21/21   [provider]  Carboxymethylcellulose Sod PF 1 % GEL Apply 1 application to eye 3 (three) times daily as needed (dry eyes).    [provider]  Cholecalciferol (VITAMIN D-3) 125 MCG (5000 UT) TABS Take 5,000 Units by mouth daily.    [provider]  clopidogrel (PLAVIX) 75 MG tablet Take 75 mg by mouth daily.    [provider]  EPINEPHrine 0.3 mg/0.3 mL IJ SOAJ injection Inject 0.3 mg into the muscle once as needed (severe allergic reaction).    [provider]  fenofibrate 160 MG tablet Take 1 tablet (160 mg total) by mouth daily. 05/15/21   Hongalgi, Lenis Dickinson, MD  insulin detemir (LEVEMIR) 100 UNIT/ML injection Inject 0.05 mLs (5 Units total) into the skin at bedtime. 05/15/21   Hongalgi, Lenis Dickinson, MD  loperamide (IMODIUM) 2 MG capsule Take 1 capsule (2 mg total) by mouth every 6 (six) hours as needed for diarrhea or loose stools. 05/15/21   Hongalgi, Lenis Dickinson, MD  loratadine (CLARITIN) 10 MG tablet Take 10  mg by mouth daily.    [provider]  memantine (NAMENDA) 5 MG tablet Take 5 mg by mouth daily.    [provider]  metFORMIN (GLUCOPHAGE) 1000 MG tablet Take 1,000 mg by mouth 2 (two) times daily with a meal. 07/05/16   [provider]  metoprolol tartrate (LOPRESSOR) 50 MG tablet Take 1 tablet (50 mg total) by mouth 2 (two) times daily. 05/15/21   Hongalgi, Lenis Dickinson, MD  naphazoline-pheniramine (VISINE-A) 0.025-0.3 % ophthalmic solution Place 1 drop into both eyes 3 (three) times daily as needed for eye irritation.    [provider]  nitroGLYCERIN (NITROSTAT) 0.4 MG SL tablet Place 0.4 mg under the tongue every 5  (five) minutes as needed for chest pain.    [provider]  Omega-3 Fatty Acids (ULTRA OMEGA 3 PO) Take 1-2 capsules by mouth See admin instructions. Ultra omega-3 500mg -1000mg  capsule 1 capsule in the morning, 2 capsules in the evening    [provider]  pantoprazole (PROTONIX) 20 MG tablet Take 20 mg by mouth daily.    [provider]  Probiotic Product (PROBIOTIC PO) Take 1 capsule by mouth daily. Probiotic 3 billion cell capsule    [provider]  QC CALCIUM 500MG -D3 500-200 MG-UNIT TABS Take 1 tablet by mouth daily. 04/10/21   [provider]  Saw Palmetto 450 MG CAPS Take 450 mg by mouth daily.    [provider]  sitaGLIPtin (JANUVIA) 25 MG tablet Take 25 mg by mouth daily.    [provider]  Zinc 23 MG LOZG Take 23 mg by mouth daily. 10/13/19   [provider]      Allergies    Baclofen, Bee venom, Contrast media [iodinated contrast media], Iodine-131, Metrizamide, Metronidazole, Atorvastatin, and Lisinopril    Review of Systems   Review of Systems  Musculoskeletal:  Positive for back pain.    Physical Exam Updated Vital Signs BP (!) 153/56   Pulse (!) 59   Temp (!) 96.7 F (35.9 C) (Axillary)   Resp 18   Ht 5\' 11"  (1.803 m)   Wt 90.7 kg   SpO2 100%   BMI 27.89 kg/m  Physical Exam Vitals and nursing note reviewed.  Constitutional:      General: He is not in acute distress.    Appearance: He is well-developed.     Comments: Appears uncomfortable  HENT:     Head: Normocephalic and atraumatic.     Mouth/Throat:     Comments: Bite marks on the left side of tongue and dried blood over the lips Eyes:     Conjunctiva/sclera: Conjunctivae normal.     Pupils: Pupils are equal, round, and reactive to light.  Neck:   Cardiovascular:     Rate and Rhythm: Normal rate and regular rhythm.     Pulses: Normal pulses.     Heart sounds: No murmur heard. Pulmonary:     Effort: Pulmonary effort is normal.  No respiratory distress.     Breath sounds: Normal breath sounds. No wheezing or rales.  Abdominal:     General: There is no distension.     Palpations: Abdomen is soft.     Tenderness: There is no abdominal tenderness. There is no guarding or rebound.  Musculoskeletal:        General: Normal range of motion.     Cervical back: Normal range of motion and neck supple. Tenderness present. Spinous process tenderness present. No muscular tenderness.  Thoracic back: Tenderness and bony tenderness present.  Skin:    General: Skin is warm and dry.     Findings: No erythema or rash.  Neurological:     Mental Status: He is alert.     Sensory: No sensory deficit.     Comments: 5 out of 5 strength in bilateral upper and lower extremities.  Oriented to person and place but does seem slightly confused.  Psychiatric:        Behavior: Behavior normal.     ED Results / Procedures / Treatments   Labs (all labs ordered are listed, but only abnormal results are displayed) Labs Reviewed  CBC WITH DIFFERENTIAL/PLATELET - Abnormal; Notable for the following components:      Result Value   WBC 20.6 (*)    Hemoglobin 11.1 (*)    HCT 34.6 (*)    MCV 79.4 (*)    MCH 25.5 (*)    Neutro Abs 15.3 (*)    Monocytes Absolute 1.1 (*)    Abs Immature Granulocytes 0.15 (*)    All other components within normal limits  COMPREHENSIVE METABOLIC PANEL - Abnormal; Notable for the following components:   Sodium 134 (*)    CO2 21 (*)    Glucose, Bld 212 (*)    Creatinine, Ser 2.16 (*)    GFR, Estimated 29 (*)    All other components within normal limits    EKG EKG Interpretation  Date/Time:  Tuesday February 01 2023 13:07:10 EDT Ventricular Rate:  57 PR Interval:    QRS Duration: 111 QT Interval:  440 QTC Calculation: 429 R Axis:   -27 Text Interpretation: Sinus rhythm Borderline left axis deviation RSR' in V1 or V2, right VCD or RVH Nonspecific T abnormalities, lateral leads Baseline wander in lead(s)  V1 Artifact Confirmed by Blanchie Dessert [phone]) on 02/01/2023 1:19:46 PM  Radiology CT Head Wo Contrast  Result Date: 02/01/2023 CLINICAL DATA:  Head trauma, minor (Age >= 65y); Back trauma, no prior imaging (Age >= 16y); Neck trauma (Age >= 65y) EXAM: CT HEAD WITHOUT CONTRAST CT CERVICAL SPINE WITHOUT CONTRAST CT THORACIC SPINE WITHOUT CONTRAST TECHNIQUE: Multidetector CT imaging of the head and cervical spine was performed following the standard protocol without intravenous contrast. Multiplanar CT image reconstructions of the cervical spine were also generated. RADIATION DOSE REDUCTION: This exam was performed according to the departmental dose-optimization program which includes automated exposure control, adjustment of the mA and/or kV according to patient size and/or use of iterative reconstruction technique. COMPARISON:  None Available. FINDINGS: CT HEAD FINDINGS Brain: No evidence of acute infarction, hemorrhage, hydrocephalus, extra-axial collection or mass lesion/mass effect. Cerebral atrophy. Patchy white matter hypodensities, nonspecific but compatible with chronic microvascular ischemic disease. Vascular: No hyperdense vessel identified. Skull: No acute fracture. Sinuses/Orbits: Paranasal sinus mucosal thickening. No acute orbital findings. Other: No mastoid effusions. CT CERVICAL SPINE FINDINGS Alignment: No substantial sagittal subluxation. Skull base and vertebrae: Acute nondisplaced fracture involving the left C6 lamina and medial lateral mass with extension to the left C6-C7 facet joint. Soft tissues and spinal canal: No prevertebral fluid or swelling. No visible canal hematoma. Disc levels: Multilevel facet uncovertebral hypertrophy with varying degrees of neural foraminal stenosis. Upper chest: Visualized lung apices are clear. CT THORACIC SPINE FINDINGS Alignment: No substantial sagittal subluxation. Vertebral bodies: No evidence of acute fracture. Vertebral body heights are maintained.  Degenerative change: Mild for age multilevel degenerative change. IMPRESSION: 1. Acute nondisplaced fracture involving the left C6 lamina and medial lateral mass with extension  to the left C6-C7 facet joint. 2. No evidence of acute intracranial abnormality. 3. No evidence of acute fracture or traumatic malalignment in the thoracic spine. Electronically Signed   By: Margaretha Sheffield M.D.   On: 02/01/2023 14:24   CT Cervical Spine Wo Contrast  Result Date: 02/01/2023 CLINICAL DATA:  Head trauma, minor (Age >= 65y); Back trauma, no prior imaging (Age >= 16y); Neck trauma (Age >= 65y) EXAM: CT HEAD WITHOUT CONTRAST CT CERVICAL SPINE WITHOUT CONTRAST CT THORACIC SPINE WITHOUT CONTRAST TECHNIQUE: Multidetector CT imaging of the head and cervical spine was performed following the standard protocol without intravenous contrast. Multiplanar CT image reconstructions of the cervical spine were also generated. RADIATION DOSE REDUCTION: This exam was performed according to the departmental dose-optimization program which includes automated exposure control, adjustment of the mA and/or kV according to patient size and/or use of iterative reconstruction technique. COMPARISON:  None Available. FINDINGS: CT HEAD FINDINGS Brain: No evidence of acute infarction, hemorrhage, hydrocephalus, extra-axial collection or mass lesion/mass effect. Cerebral atrophy. Patchy white matter hypodensities, nonspecific but compatible with chronic microvascular ischemic disease. Vascular: No hyperdense vessel identified. Skull: No acute fracture. Sinuses/Orbits: Paranasal sinus mucosal thickening. No acute orbital findings. Other: No mastoid effusions. CT CERVICAL SPINE FINDINGS Alignment: No substantial sagittal subluxation. Skull base and vertebrae: Acute nondisplaced fracture involving the left C6 lamina and medial lateral mass with extension to the left C6-C7 facet joint. Soft tissues and spinal canal: No prevertebral fluid or swelling. No  visible canal hematoma. Disc levels: Multilevel facet uncovertebral hypertrophy with varying degrees of neural foraminal stenosis. Upper chest: Visualized lung apices are clear. CT THORACIC SPINE FINDINGS Alignment: No substantial sagittal subluxation. Vertebral bodies: No evidence of acute fracture. Vertebral body heights are maintained. Degenerative change: Mild for age multilevel degenerative change. IMPRESSION: 1. Acute nondisplaced fracture involving the left C6 lamina and medial lateral mass with extension to the left C6-C7 facet joint. 2. No evidence of acute intracranial abnormality. 3. No evidence of acute fracture or traumatic malalignment in the thoracic spine. Electronically Signed   By: Margaretha Sheffield M.D.   On: 02/01/2023 14:24   CT Thoracic Spine Wo Contrast  Result Date: 02/01/2023 CLINICAL DATA:  Head trauma, minor (Age >= 65y); Back trauma, no prior imaging (Age >= 16y); Neck trauma (Age >= 65y) EXAM: CT HEAD WITHOUT CONTRAST CT CERVICAL SPINE WITHOUT CONTRAST CT THORACIC SPINE WITHOUT CONTRAST TECHNIQUE: Multidetector CT imaging of the head and cervical spine was performed following the standard protocol without intravenous contrast. Multiplanar CT image reconstructions of the cervical spine were also generated. RADIATION DOSE REDUCTION: This exam was performed according to the departmental dose-optimization program which includes automated exposure control, adjustment of the mA and/or kV according to patient size and/or use of iterative reconstruction technique. COMPARISON:  None Available. FINDINGS: CT HEAD FINDINGS Brain: No evidence of acute infarction, hemorrhage, hydrocephalus, extra-axial collection or mass lesion/mass effect. Cerebral atrophy. Patchy white matter hypodensities, nonspecific but compatible with chronic microvascular ischemic disease. Vascular: No hyperdense vessel identified. Skull: No acute fracture. Sinuses/Orbits: Paranasal sinus mucosal thickening. No acute  orbital findings. Other: No mastoid effusions. CT CERVICAL SPINE FINDINGS Alignment: No substantial sagittal subluxation. Skull base and vertebrae: Acute nondisplaced fracture involving the left C6 lamina and medial lateral mass with extension to the left C6-C7 facet joint. Soft tissues and spinal canal: No prevertebral fluid or swelling. No visible canal hematoma. Disc levels: Multilevel facet uncovertebral hypertrophy with varying degrees of neural foraminal stenosis. Upper chest: Visualized lung apices are  clear. CT THORACIC SPINE FINDINGS Alignment: No substantial sagittal subluxation. Vertebral bodies: No evidence of acute fracture. Vertebral body heights are maintained. Degenerative change: Mild for age multilevel degenerative change. IMPRESSION: 1. Acute nondisplaced fracture involving the left C6 lamina and medial lateral mass with extension to the left C6-C7 facet joint. 2. No evidence of acute intracranial abnormality. 3. No evidence of acute fracture or traumatic malalignment in the thoracic spine. Electronically Signed   By: Margaretha Sheffield M.D.   On: 02/01/2023 14:24    Procedures Procedures    Medications Ordered in ED Medications  morphine (PF) 4 MG/ML injection 4 mg (4 mg Intravenous Given 02/01/23 1436)  ondansetron (ZOFRAN) injection 4 mg (4 mg Intravenous Given 02/01/23 1433)    ED Course/ Medical Decision Making/ A&P                             Medical Decision Making Amount and/or Complexity of Data Reviewed Independent Historian: caregiver    Details: daughter External Data Reviewed: notes. Labs: ordered. Decision-making details documented in ED Course. Radiology: ordered and independent interpretation performed. Decision-making details documented in ED Course. ECG/medicine tests: ordered and independent interpretation performed. Decision-making details documented in ED Course.  Risk Prescription drug management.   Pt with multiple medical problems and comorbidities  and presenting today with a complaint that caries a high risk for morbidity and mortality.  Here today with complaints of pain in his shoulders and back.  Symptoms started suddenly when he was at the chiropractor.  Unclear if the chiropractor did an adjustment or if this occurred prior to that.  However patient is noted to have a skin tear on his right elbow and a bite marks on the left side of his tongue.  Nobody noted any falls or seizure-like activity.  Patient reports he cannot remember but does not think that he fell.  He is moving all extremities without difficulty but appears uncomfortable.  Concern for possible atypical ACS versus injury from a fall and possible fracture versus possible seizure with some mild postictal phase.  Patient was given pain control.  Imaging of the head and neck and thoracic spine pending.  He has no lower back pain or neurodeficits in the lower extremities.  Lower suspicion for dissection at this time.  Patient does have a history of dementia but appears to be high functioning.  3:13 PM Patient's daughter arrives and reports that patient was in a transport Lucianne Lei today going to his son the chiropractor at his office and when patient arrived there he was bleeding from the mouth and complaining of severe pain in his shoulders and back.  Unclear what happened today.  The driver said that he just started complaining of pain but is unclear how he scraped his elbow or bit his tongue.  Concern for possible seizure.  I independently interpreted patient's labs today and he does have a white count of 20 but normal hemoglobin, CMP with creatinine of 2.15 unclear what his new or recent baseline is.  I have independently visualized and interpreted pt's images today.  CT of the head is within normal limits, thoracic spine is normal.  Cervical spine with concern for fracture.  Radiology reports a C6 acute nondisplaced fracture involving the left lamina and medial lateral mass with extension to  the left C6-C7 facet joint.  Patient was placed in a cervical collar.  Spoke with neurosurgery who recommended patient needs to be in a  collar at all times.  He can receive physical therapy and can stand.  No further imaging is necessary.  Patient is having significant pain and feel that he will need pain control and physical therapy as he lives in [place] independently.  Also concern what happened today.  Still concern for possible seizure.  Will discuss with neurology.  3:58 PM On repeat evaluation in patient now has new weakness in the right upper extremity and unable to lift his right lower extremity which is new from his initial exam.  Concern for possible hematoma with the known fracture.  Will get a repeat CT and an MRI.  Will discuss with neurosurgery if new hematoma.  CRITICAL CARE Performed by: Yasheka Fossett Total critical care time: 30 minutes Critical care time was exclusive of separately billable procedures and treating other patients. Critical care was necessary to treat or prevent imminent or life-threatening deterioration. Critical care was time spent personally by me on the following activities: development of treatment plan with patient and/or surrogate as well as nursing, discussions with consultants, evaluation of patient's response to treatment, examination of patient, obtaining history from patient or surrogate, ordering and performing treatments and interventions, ordering and review of laboratory studies, ordering and review of radiographic studies, pulse oximetry and re-evaluation of patient's condition.        Final Clinical Impression(s) / ED Diagnoses Final diagnoses:  None    Rx / DC Orders ED Discharge Orders     None         Blanchie Dessert, MD 02/01/23 1559

## 2023-02-01 NOTE — Procedures (Signed)
Patient Name: Duane [name]  MRN: [id number]  Epilepsy Attending: Lora Havens  Referring Physician/Provider: Audley Hose, MD  Date: 02/01/2023 Duration: 23.08 mins  Patient history: 88yo M getting EEG due to evaluate for seizure,   Level of alertness: Awake  AEDs during EEG study: None  Technical aspects: This EEG study was done with scalp electrodes positioned according to the 10-20 International system of electrode placement. Electrical activity was reviewed with band pass filter of 1-70Hz , sensitivity of 7 uV/mm, display speed of 78mm/sec with a 60Hz  notched filter applied as appropriate. EEG data were recorded continuously and digitally stored.  Video monitoring was available and reviewed as appropriate.  Description: The posterior dominant rhythm consists of 8-9 Hz activity of moderate voltage (25-35 uV) seen predominantly in posterior head regions, symmetric and reactive to eye opening and eye closing. Physiologic photic driving was not seen during photic stimulation.  Hyperventilation was not performed.     IMPRESSION: This study is within normal limits. No seizures or epileptiform discharges were seen throughout the recording.  A normal interictal EEG does not exclude the diagnosis of epilepsy.  Malosi Hemstreet [name]

## 2023-02-01 NOTE — ED Notes (Signed)
Patient transported to CT 

## 2023-02-02 ENCOUNTER — Inpatient Hospital Stay (HOSPITAL_COMMUNITY): Payer: Medicare Other

## 2023-02-02 ENCOUNTER — Other Ambulatory Visit: Payer: Self-pay

## 2023-02-02 ENCOUNTER — Encounter (HOSPITAL_COMMUNITY): Payer: Self-pay | Admitting: Neurological Surgery

## 2023-02-02 DIAGNOSIS — S064XAA Epidural hemorrhage with loss of consciousness status unknown, initial encounter: Secondary | ICD-10-CM | POA: Diagnosis not present

## 2023-02-02 DIAGNOSIS — N179 Acute kidney failure, unspecified: Secondary | ICD-10-CM | POA: Diagnosis not present

## 2023-02-02 DIAGNOSIS — N1832 Chronic kidney disease, stage 3b: Secondary | ICD-10-CM | POA: Diagnosis not present

## 2023-02-02 DIAGNOSIS — Z9889 Other specified postprocedural states: Secondary | ICD-10-CM

## 2023-02-02 DIAGNOSIS — S129XXA Fracture of neck, unspecified, initial encounter: Secondary | ICD-10-CM | POA: Diagnosis present

## 2023-02-02 LAB — RENAL FUNCTION PANEL
Albumin: 3 g/dL — ABNORMAL LOW (ref 3.5–5.0)
Anion gap: 12 (ref 5–15)
BUN: 25 mg/dL — ABNORMAL HIGH (ref 8–23)
CO2: 20 mmol/L — ABNORMAL LOW (ref 22–32)
Calcium: 8.3 mg/dL — ABNORMAL LOW (ref 8.9–10.3)
Chloride: 100 mmol/L (ref 98–111)
Creatinine, Ser: 2.04 mg/dL — ABNORMAL HIGH (ref 0.61–1.24)
GFR, Estimated: 31 mL/min — ABNORMAL LOW (ref >60)
Glucose, Bld: 258 mg/dL — ABNORMAL HIGH (ref 70–99)
Phosphorus: 4 mg/dL (ref 2.5–4.6)
Potassium: 4 mmol/L (ref 3.5–5.1)
Sodium: 132 mmol/L — ABNORMAL LOW (ref 135–145)

## 2023-02-02 LAB — CBC
HCT: 26.6 % — ABNORMAL LOW (ref 39.0–52.0)
Hemoglobin: 8.5 g/dL — ABNORMAL LOW (ref 13.0–17.0)
MCH: 25.4 pg — ABNORMAL LOW (ref 26.0–34.0)
MCHC: 32 g/dL (ref 30.0–36.0)
MCV: 79.6 fL — ABNORMAL LOW (ref 80.0–100.0)
Platelets: 212 10*3/uL (ref 150–400)
RBC: 3.34 MIL/uL — ABNORMAL LOW (ref 4.22–5.81)
RDW: 15.3 % (ref 11.5–15.5)
WBC: 12.9 10*3/uL — ABNORMAL HIGH (ref 4.0–10.5)
nRBC: 0 % (ref 0.0–0.2)

## 2023-02-02 LAB — URINALYSIS, ROUTINE W REFLEX MICROSCOPIC
Bilirubin Urine: NEGATIVE
Glucose, UA: >=500 mg/dL — AB
Hgb urine dipstick: NEGATIVE
Ketones, ur: NEGATIVE mg/dL
Nitrite: NEGATIVE
Protein, ur: NEGATIVE mg/dL
Specific Gravity, Urine: 1.018 (ref 1.005–1.030)
WBC, UA: >50 WBC/hpf (ref 0–5)
pH: 5 (ref 5.0–8.0)

## 2023-02-02 LAB — PREPARE PLATELET PHERESIS: Unit division: 0

## 2023-02-02 LAB — GLUCOSE, CAPILLARY
Glucose-Capillary: 223 mg/dL — ABNORMAL HIGH (ref 70–99)
Glucose-Capillary: 259 mg/dL — ABNORMAL HIGH (ref 70–99)
Glucose-Capillary: 265 mg/dL — ABNORMAL HIGH (ref 70–99)
Glucose-Capillary: 272 mg/dL — ABNORMAL HIGH (ref 70–99)
Glucose-Capillary: 228 mg/dL — ABNORMAL HIGH (ref 70–99)
Glucose-Capillary: 189 mg/dL — ABNORMAL HIGH (ref 70–99)

## 2023-02-02 LAB — TROPONIN I (HIGH SENSITIVITY): Troponin I (High Sensitivity): 16 ng/L (ref <18)

## 2023-02-02 LAB — SURGICAL PCR SCREEN
MRSA, PCR: NEGATIVE
Staphylococcus aureus: NEGATIVE

## 2023-02-02 LAB — BPAM PLATELET PHERESIS
Blood Product Expiration Date: 202403222359
ISSUE DATE / TIME: 202403192326
Unit Type and Rh: 5100

## 2023-02-02 LAB — ABO/RH: ABO/RH(D): B POS

## 2023-02-02 MED ORDER — SODIUM CHLORIDE 0.9 % IV SOLN
250.0000 mL | INTRAVENOUS | Status: DC
  Administered 2023-02-02: 250 mL via INTRAVENOUS

## 2023-02-02 MED ORDER — POLYETHYLENE GLYCOL 3350 17 G PO PACK
17.0000 g | PACK | Freq: Every day | ORAL | Status: DC
  Administered 2023-02-02 – 2023-02-07 (×6): 17 g via ORAL
  Filled 2023-02-02 (×5): qty 1

## 2023-02-02 MED ORDER — AMLODIPINE BESYLATE 10 MG PO TABS
10.0000 mg | ORAL_TABLET | Freq: Every day | ORAL | Status: DC
  Administered 2023-02-02 – 2023-02-10 (×9): 10 mg via ORAL
  Filled 2023-02-02 (×9): qty 1

## 2023-02-02 MED ORDER — FENTANYL CITRATE (PF) 100 MCG/2ML IJ SOLN
25.0000 ug | INTRAMUSCULAR | Status: DC | PRN
  Administered 2023-02-02 (×2): 50 ug via INTRAVENOUS

## 2023-02-02 MED ORDER — OXYCODONE HCL 5 MG PO TABS
5.0000 mg | ORAL_TABLET | ORAL | Status: DC | PRN
  Administered 2023-02-02 – 2023-02-07 (×4): 5 mg via ORAL
  Filled 2023-02-02 (×4): qty 1

## 2023-02-02 MED ORDER — CEFAZOLIN SODIUM-DEXTROSE 2-4 GM/100ML-% IV SOLN
2.0000 g | Freq: Three times a day (TID) | INTRAVENOUS | Status: AC
  Administered 2023-02-02 (×2): 2 g via INTRAVENOUS
  Filled 2023-02-02 (×2): qty 100

## 2023-02-02 MED ORDER — SODIUM CHLORIDE 0.9 % IV SOLN: INTRAVENOUS | Status: AC

## 2023-02-02 MED ORDER — SODIUM CHLORIDE (PF) 0.9 % IJ SOLN
INTRAMUSCULAR | Status: AC
  Filled 2023-02-02: qty 10

## 2023-02-02 MED ORDER — ESCITALOPRAM OXALATE 10 MG PO TABS
5.0000 mg | ORAL_TABLET | Freq: Every day | ORAL | Status: DC
  Administered 2023-02-02 – 2023-02-10 (×9): 5 mg via ORAL
  Filled 2023-02-02 (×9): qty 1

## 2023-02-02 MED ORDER — LABETALOL HCL 5 MG/ML IV SOLN
INTRAVENOUS | Status: DC | PRN
  Administered 2023-02-02: 10 mg via INTRAVENOUS
  Administered 2023-02-02 (×2): 5 mg via INTRAVENOUS

## 2023-02-02 MED ORDER — KETOROLAC TROMETHAMINE 30 MG/ML IJ SOLN: 30.0000 mg | Freq: Four times a day (QID) | INTRAMUSCULAR | Status: DC | PRN

## 2023-02-02 MED ORDER — EPHEDRINE 5 MG/ML INJ
INTRAVENOUS | Status: AC
  Filled 2023-02-02: qty 5

## 2023-02-02 MED ORDER — ONDANSETRON HCL 4 MG/2ML IJ SOLN
4.0000 mg | Freq: Four times a day (QID) | INTRAMUSCULAR | Status: DC | PRN
  Filled 2023-02-02: qty 2

## 2023-02-02 MED ORDER — INSULIN ASPART 100 UNIT/ML IJ SOLN
5.0000 [IU] | Freq: Once | INTRAMUSCULAR | Status: AC
  Administered 2023-02-02: 5 [IU] via SUBCUTANEOUS

## 2023-02-02 MED ORDER — MEMANTINE HCL 10 MG PO TABS
5.0000 mg | ORAL_TABLET | Freq: Every day | ORAL | Status: DC
  Administered 2023-02-02 – 2023-02-10 (×9): 5 mg via ORAL
  Filled 2023-02-02 (×9): qty 1

## 2023-02-02 MED ORDER — NITROGLYCERIN 0.4 MG SL SUBL: 0.4000 mg | SUBLINGUAL_TABLET | SUBLINGUAL | Status: DC | PRN

## 2023-02-02 MED ORDER — ESMOLOL HCL 100 MG/10ML IV SOLN
INTRAVENOUS | Status: AC
  Filled 2023-02-02: qty 10

## 2023-02-02 MED ORDER — CEFAZOLIN SODIUM 1 G IJ SOLR
INTRAMUSCULAR | Status: AC
  Filled 2023-02-02: qty 20

## 2023-02-02 MED ORDER — LABETALOL HCL 5 MG/ML IV SOLN
INTRAVENOUS | Status: AC
  Filled 2023-02-02: qty 4

## 2023-02-02 MED ORDER — ONDANSETRON HCL 4 MG PO TABS
4.0000 mg | ORAL_TABLET | Freq: Four times a day (QID) | ORAL | Status: DC | PRN
  Administered 2023-02-10: 4 mg via ORAL

## 2023-02-02 MED ORDER — DEXAMETHASONE SODIUM PHOSPHATE 10 MG/ML IJ SOLN
INTRAMUSCULAR | Status: DC | PRN
  Administered 2023-02-01: 5 mg via INTRAVENOUS

## 2023-02-02 MED ORDER — LINAGLIPTIN 5 MG PO TABS
5.0000 mg | ORAL_TABLET | Freq: Every day | ORAL | Status: DC
  Administered 2023-02-02 – 2023-02-10 (×9): 5 mg via ORAL
  Filled 2023-02-02 (×9): qty 1

## 2023-02-02 MED ORDER — MENTHOL 3 MG MT LOZG: 1.0000 | LOZENGE | OROMUCOSAL | Status: DC | PRN

## 2023-02-02 MED ORDER — ACETAMINOPHEN 10 MG/ML IV SOLN
INTRAVENOUS | Status: DC | PRN
  Administered 2023-02-02: 1000 mg via INTRAVENOUS

## 2023-02-02 MED ORDER — INSULIN GLARGINE-YFGN 100 UNIT/ML ~~LOC~~ SOLN
8.0000 [IU] | Freq: Every day | SUBCUTANEOUS | Status: DC
  Administered 2023-02-02 – 2023-02-10 (×9): 8 [IU] via SUBCUTANEOUS
  Filled 2023-02-02 (×9): qty 0.08

## 2023-02-02 MED ORDER — FENOFIBRATE 160 MG PO TABS
160.0000 mg | ORAL_TABLET | Freq: Every day | ORAL | Status: DC
  Administered 2023-02-02 – 2023-02-10 (×9): 160 mg via ORAL
  Filled 2023-02-02 (×9): qty 1

## 2023-02-02 MED ORDER — KETOROLAC TROMETHAMINE 30 MG/ML IJ SOLN
15.0000 mg | Freq: Four times a day (QID) | INTRAMUSCULAR | Status: DC | PRN
  Administered 2023-02-02: 15 mg via INTRAVENOUS
  Filled 2023-02-02: qty 1

## 2023-02-02 MED ORDER — PHENOL 1.4 % MT LIQD: 1.0000 | OROMUCOSAL | Status: DC | PRN

## 2023-02-02 MED ORDER — METOPROLOL SUCCINATE ER 50 MG PO TB24
50.0000 mg | ORAL_TABLET | Freq: Every day | ORAL | Status: DC
  Administered 2023-02-02 – 2023-02-10 (×9): 50 mg via ORAL
  Filled 2023-02-02 (×9): qty 1

## 2023-02-02 MED ORDER — ACETAMINOPHEN 500 MG PO TABS
1000.0000 mg | ORAL_TABLET | Freq: Three times a day (TID) | ORAL | Status: DC
  Administered 2023-02-02 – 2023-02-10 (×21): 1000 mg via ORAL
  Filled 2023-02-02 (×22): qty 2

## 2023-02-02 MED ORDER — HYDROMORPHONE HCL 1 MG/ML IJ SOLN
1.0000 mg | INTRAMUSCULAR | Status: DC | PRN
  Administered 2023-02-02 – 2023-02-08 (×20): 1 mg via INTRAVENOUS
  Filled 2023-02-02 (×21): qty 1

## 2023-02-02 MED ORDER — SUGAMMADEX SODIUM 200 MG/2ML IV SOLN
INTRAVENOUS | Status: DC | PRN
  Administered 2023-02-02: 200 mg via INTRAVENOUS

## 2023-02-02 MED ORDER — ONDANSETRON HCL 4 MG/2ML IJ SOLN
INTRAMUSCULAR | Status: DC | PRN
  Administered 2023-02-02: 4 mg via INTRAVENOUS

## 2023-02-02 MED ORDER — DOCUSATE SODIUM 100 MG PO CAPS
100.0000 mg | ORAL_CAPSULE | Freq: Two times a day (BID) | ORAL | Status: DC
  Administered 2023-02-02: 100 mg via ORAL
  Filled 2023-02-02: qty 1

## 2023-02-02 MED ORDER — ONDANSETRON HCL 4 MG/2ML IJ SOLN: 4.0000 mg | Freq: Once | INTRAMUSCULAR | Status: DC | PRN

## 2023-02-02 MED ORDER — INSULIN ASPART 100 UNIT/ML IJ SOLN
0.0000 [IU] | Freq: Three times a day (TID) | INTRAMUSCULAR | Status: DC
  Administered 2023-02-02: 5 [IU] via SUBCUTANEOUS
  Administered 2023-02-02 (×2): 8 [IU] via SUBCUTANEOUS
  Administered 2023-02-03 (×2): 3 [IU] via SUBCUTANEOUS
  Administered 2023-02-03: 2 [IU] via SUBCUTANEOUS
  Administered 2023-02-04 (×2): 3 [IU] via SUBCUTANEOUS
  Administered 2023-02-04: 5 [IU] via SUBCUTANEOUS
  Administered 2023-02-05 (×2): 3 [IU] via SUBCUTANEOUS
  Administered 2023-02-05 – 2023-02-06 (×2): 2 [IU] via SUBCUTANEOUS
  Administered 2023-02-06 – 2023-02-08 (×6): 3 [IU] via SUBCUTANEOUS
  Administered 2023-02-08: 5 [IU] via SUBCUTANEOUS
  Administered 2023-02-08 – 2023-02-09 (×3): 2 [IU] via SUBCUTANEOUS
  Administered 2023-02-09: 3 [IU] via SUBCUTANEOUS
  Administered 2023-02-10: 2 [IU] via SUBCUTANEOUS

## 2023-02-02 MED ORDER — ESMOLOL HCL 100 MG/10ML IV SOLN
INTRAVENOUS | Status: DC | PRN
  Administered 2023-02-02 (×2): 20 mg via INTRAVENOUS

## 2023-02-02 MED ORDER — POLYETHYLENE GLYCOL 3350 17 G PO PACK: 17.0000 g | PACK | Freq: Every day | ORAL | Status: DC | PRN

## 2023-02-02 MED ORDER — SCOPOLAMINE 1 MG/3DAYS TD PT72
MEDICATED_PATCH | TRANSDERMAL | Status: AC
  Filled 2023-02-02: qty 1

## 2023-02-02 MED ORDER — PANTOPRAZOLE SODIUM 20 MG PO TBEC
20.0000 mg | DELAYED_RELEASE_TABLET | Freq: Every day | ORAL | Status: DC
  Administered 2023-02-02 – 2023-02-10 (×9): 20 mg via ORAL
  Filled 2023-02-02 (×9): qty 1

## 2023-02-02 MED ORDER — FENTANYL CITRATE (PF) 100 MCG/2ML IJ SOLN
INTRAMUSCULAR | Status: AC
  Filled 2023-02-02: qty 2

## 2023-02-02 MED ORDER — ACETAMINOPHEN 650 MG RE SUPP: 650.0000 mg | RECTAL | Status: DC | PRN

## 2023-02-02 MED ORDER — CHLORHEXIDINE GLUCONATE CLOTH 2 % EX PADS
6.0000 | MEDICATED_PAD | Freq: Every morning | CUTANEOUS | Status: DC
  Administered 2023-02-02 – 2023-02-03 (×2): 6 via TOPICAL

## 2023-02-02 MED ORDER — PHENYLEPHRINE 80 MCG/ML (10ML) SYRINGE FOR IV PUSH (FOR BLOOD PRESSURE SUPPORT)
PREFILLED_SYRINGE | INTRAVENOUS | Status: AC
  Filled 2023-02-02: qty 10

## 2023-02-02 MED ORDER — ACETAMINOPHEN 325 MG PO TABS
650.0000 mg | ORAL_TABLET | ORAL | Status: DC | PRN
  Administered 2023-02-02: 650 mg via ORAL
  Filled 2023-02-02: qty 2

## 2023-02-02 MED ORDER — OXYCODONE HCL 5 MG PO TABS
10.0000 mg | ORAL_TABLET | ORAL | Status: DC | PRN
  Administered 2023-02-02 – 2023-02-10 (×15): 10 mg via ORAL
  Filled 2023-02-02 (×16): qty 2

## 2023-02-02 MED ORDER — SODIUM CHLORIDE 0.9% FLUSH: 3.0000 mL | INTRAVENOUS | Status: DC | PRN

## 2023-02-02 MED ORDER — SODIUM CHLORIDE 0.9% FLUSH
3.0000 mL | Freq: Two times a day (BID) | INTRAVENOUS | Status: DC
  Administered 2023-02-02 – 2023-02-10 (×17): 3 mL via INTRAVENOUS

## 2023-02-02 MED ORDER — CHLORHEXIDINE GLUCONATE CLOTH 2 % EX PADS
6.0000 | MEDICATED_PAD | Freq: Once | CUTANEOUS | Status: AC
  Administered 2023-02-02: 6 via TOPICAL

## 2023-02-02 MED ORDER — SODIUM CHLORIDE 0.9 % IV SOLN: INTRAVENOUS | Status: DC | PRN

## 2023-02-02 NOTE — Progress Notes (Signed)
CSW awaiting response from [facility] on process for patient to go to their SNF side as currently recommended.   Gilmore Laroche, MSW, [facility]

## 2023-02-02 NOTE — Evaluation (Signed)
Physical Therapy Evaluation Patient Details Name: Duane [name] MRN: [id number] DOB: 1935-08-14 Today's Date: 02/02/2023  History of Present Illness  88 y.o. male admitted 02/01/23 after suspected fall and found to have bilateral shoulder pain, and blood in his mouth.  Patient found to have epidural hematoma and cervical spinal ligamentous injury and change in alignment.   He underwent C5-C7 laminectomies for evacuation of epidural hematoma and C5-T1 posterior instrumented fusion.  PMH positive for a-fib, prior CVA, arthritis, CAD, DM, Eczema, HTN, HLD, CKD III, sleep apnea, MI, prostatic hypertrophy and MI.  Clinical Impression  Patient presents with decreased mobility due to pain in neck, poor activity tolerance, decreased sitting balance and R side weakness (though present pre-morbid from prior stroke per son).  Currently mod A of 2 to roll and max A for sidelying to sit.  He was in pain while seated EOB and RN had to dress wound as draining around JP drain site.  Patient unable to tolerate mobility further so assisted back to supine.  Previously living at ALF and mobilizing with aide of power recliner to power wheelchair and staff assisted with ADL's.  Feel he may need STSNF level rehab prior to d/c.  Son reports plans for [employer].  PT will continue to follow.        Recommendations for follow up therapy are one component of a multi-disciplinary discharge planning process, led by the attending physician.  Recommendations may be updated based on patient status, additional functional criteria and insurance authorization.  Follow Up Recommendations Skilled nursing-short term rehab (<3 hours/day) Can patient physically be transported by private vehicle: No    Assistance Recommended at Discharge Frequent or constant Supervision/Assistance  Patient can return home with the following  Two people to help with walking and/or transfers;Assistance with cooking/housework;Assist for  transportation;Two people to help with bathing/dressing/bathroom    Equipment Recommendations None recommended by PT  Recommendations for Other Services       Functional Status Assessment Patient has had a recent decline in their functional status and demonstrates the ability to make significant improvements in function in a reasonable and predictable amount of time.     Precautions / Restrictions Precautions Precautions: Fall;Cervical Precaution Comments: no brace needed (but may benefit from soft collar for pain)      Mobility  Bed Mobility Overal bed mobility: Needs Assistance Bed Mobility: Rolling, Sidelying to Sit, Sit to Sidelying Rolling: Min assist, +2 for physical assistance Sidelying to sit: Max assist, +2 for physical assistance     Sit to sidelying: Max assist, +2 for physical assistance General bed mobility comments: assist for R leg off bed and to lift trunk upright using bed pad under pt to lift; noted drainage from surgical site despite JP drain in place and RN in to help with placing dressing over site; pt too painful to move more so assisted back to sidelying then to supine and positioning in bed for comfort.    Transfers                   General transfer comment: NT due to pain    Ambulation/Gait                  Stairs            Wheelchair Mobility    Modified Rankin (Stroke Patients Only)       Balance Overall balance assessment: Needs assistance Sitting-balance support: Feet unsupported, Single extremity supported Sitting balance-Leahy Scale: Poor  Sitting balance - Comments: mod A for balance on EOB with feet unsupported       Standing balance comment: NT                             Pertinent Vitals/Pain Pain Assessment Pain Assessment: Faces Faces Pain Scale: Hurts worst Pain Location: neck and upper back with mobility Pain Descriptors / Indicators: Aching, Discomfort, Guarding, Grimacing, Operative  site guarding Pain Intervention(s): Limited activity within patient's tolerance, Monitored during session, Repositioned, Premedicated before session    Home Living Family/patient expects to be discharged to:: Assisted living                 Home Equipment: Wheelchair - power;Shower seat;Grab bars - tub/shower;Grab bars - toilet Additional Comments: ALF. Live in girlfriend.    Prior Function Prior Level of Function : Needs assist             Mobility Comments: uses powered w/c, lift recliner ADLs Comments: supervision with bathing     Hand Dominance   Dominant Hand: Right    Extremity/Trunk Assessment   Upper Extremity Assessment Upper Extremity Assessment: Defer to OT evaluation    Lower Extremity Assessment Lower Extremity Assessment: RLE deficits/detail;LLE deficits/detail RLE Deficits / Details: AAROM WFL, strength hip flexion 2-/5, knee extension 3-/5, ankle DF 1/5, PF 3/5 RLE Sensation: decreased light touch RLE Coordination: decreased gross motor LLE Deficits / Details: AROM WFL, strength hip flexion 3/5, knee extension 4/5, ankle DF 4/5 LLE Sensation: decreased light touch    Cervical / Trunk Assessment Cervical / Trunk Assessment: Kyphotic;Neck Surgery  Communication      Cognition Arousal/Alertness: Awake/alert Behavior During Therapy: Anxious Overall Cognitive Status: History of cognitive impairments - at baseline                                 General Comments: history of dementia        General Comments General comments (skin integrity, edema, etc.): VSS on 2.5 L O2; son in the room and supportive, he has chiropractor business.    Exercises     Assessment/Plan    PT Assessment Patient needs continued PT services  PT Problem List Decreased strength;Decreased balance;Decreased knowledge of precautions;Pain;Decreased knowledge of use of DME       PT Treatment Interventions DME instruction;Functional mobility  training;Balance training;Patient/family education;Therapeutic activities;Gait training;Therapeutic exercise    PT Goals (Current goals can be found in the Care Plan section)  Acute Rehab PT Goals Patient Stated Goal: improve pain PT Goal Formulation: With patient/family Time For Goal Achievement: 02/16/23 Potential to Achieve Goals: Fair    Frequency Min 3X/week     Co-evaluation PT/OT/SLP Co-Evaluation/Treatment: Yes Reason for Co-Treatment: To address functional/ADL transfers;For patient/therapist safety PT goals addressed during session: Mobility/safety with mobility;Balance         AM-PAC PT "6 Clicks" Mobility  Outcome Measure Help needed turning from your back to your side while in a flat bed without using bedrails?: Total Help needed moving from lying on your back to sitting on the side of a flat bed without using bedrails?: Total Help needed moving to and from a bed to a chair (including a wheelchair)?: Total Help needed standing up from a chair using your arms (e.g., wheelchair or bedside chair)?: Total Help needed to walk in hospital room?: Total Help needed climbing 3-5 steps with a railing? :  Total 6 Click Score: 6    End of Session Equipment Utilized During Treatment: Oxygen Activity Tolerance: Patient limited by pain Patient left: in bed;with bed alarm set;with call bell/phone within reach   PT Visit Diagnosis: Other abnormalities of gait and mobility (R26.89);Muscle weakness (generalized) (M62.81);Pain Pain - Right/Left:  (both) Pain - part of body:  (neck and shoulders)    Time: [id number] PT Time Calculation (min) (ACUTE ONLY): 25 min   Charges:   PT Evaluation $PT Eval Moderate Complexity: 1 Mod          Cyndi Takina Busser, PT Acute Rehabilitation Services [phone] 02/02/2023   Reginia Naas 02/02/2023, 12:23 PM

## 2023-02-02 NOTE — Evaluation (Signed)
Occupational Therapy Evaluation Patient Details Name: Duane [name] MRN: [id number] DOB: 07/31/1935 Today's Date: 02/02/2023   History of Present Illness 88 y.o. male admitted 02/01/23 after suspected fall and found to have bilateral shoulder pain, and blood in his mouth.  Patient found to have epidural hematoma and cervical spinal ligamentous injury and change in alignment.   He underwent C5-C7 laminectomies for evacuation of epidural hematoma and C5-T1 posterior instrumented fusion.  PMH positive for a-fib, prior CVA, arthritis, CAD, DM, Eczema, HTN, HLD, CKD III, sleep apnea, MI, prostatic hypertrophy and MI.   Clinical Impression   Pt currently with functional limitations due to the deficits listed below (see OT Problem List). Prior to admit, pt was living in ALF primarily Mod I with all BADL tasks. He receives assistance from staff when needed. Utilized a powered wheelchair to get around facility. Pt will benefit from skilled OT to increase their safety and independence with ADL and functional mobility for ADL to facilitate discharge to venue listed below. OT will continue to follow patient acutely.        Recommendations for follow up therapy are one component of a multi-disciplinary discharge planning process, led by the attending physician.  Recommendations may be updated based on patient status, additional functional criteria and insurance authorization.   Follow Up Recommendations  Skilled nursing-short term rehab (<3 hours/day)     Assistance Recommended at Discharge Frequent or constant Supervision/Assistance  Patient can return home with the following Two people to help with walking and/or transfers;Two people to help with bathing/dressing/bathroom;Assist for transportation;Help with stairs or ramp for entrance;Assistance with cooking/housework    Functional Status Assessment  Patient has had a recent decline in their functional status and demonstrates the ability to make  significant improvements in function in a reasonable and predictable amount of time.  Equipment Recommendations  Other (comment) (defer to next venue)       Precautions / Restrictions Precautions Precautions: Fall;Cervical Precaution Comments: no brace needed (but may benefit from soft collar for pain)      Mobility Bed Mobility Overal bed mobility: Needs Assistance Bed Mobility: Rolling, Sidelying to Sit, Sit to Sidelying Rolling: Min assist, +2 for physical assistance Sidelying to sit: Max assist, +2 for physical assistance     Sit to sidelying: Max assist, +2 for physical assistance General bed mobility comments: assist for R leg off bed and to lift trunk upright using bed pad under pt to lift; noted drainage from surgical site despite JP drain in place and RN in to help with placing dressing over site; pt too painful to move more so assisted back to sidelying then to supine and positioning in bed for comfort.    Transfers   General transfer comment: NT due to pain      Balance Overall balance assessment: Needs assistance Sitting-balance support: Feet unsupported, Single extremity supported Sitting balance-Leahy Scale: Poor Sitting balance - Comments: mod A for balance on EOB with feet unsupported       Standing balance comment: NT         ADL either performed or assessed with clinical judgement   ADL Overall ADL's : Needs assistance/impaired   General ADL Comments: Due to pain level, pt requires total assist for all ADL tasks at bed level.     Vision Baseline Vision/History: 0 No visual deficits Ability to See in Adequate Light: 0 Adequate Patient Visual Report: No change from baseline Vision Assessment?: No apparent visual deficits     Perception Perception  Perception Tested?: No   Praxis Praxis Praxis tested?: Not tested    Pertinent Vitals/Pain Pain Assessment Pain Assessment: Faces Faces Pain Scale: Hurts worst Pain Location: neck and upper back  with mobility Pain Descriptors / Indicators: Aching, Discomfort, Guarding, Grimacing, Operative site guarding Pain Intervention(s): Limited activity within patient's tolerance, Monitored during session, Repositioned     Hand Dominance Right   Extremity/Trunk Assessment Upper Extremity Assessment Upper Extremity Assessment: Generalized weakness   Lower Extremity Assessment Lower Extremity Assessment: Defer to PT evaluation RLE Deficits / Details: AAROM WFL, strength hip flexion 2-/5, knee extension 3-/5, ankle DF 1/5, PF 3/5 RLE Sensation: decreased light touch RLE Coordination: decreased gross motor LLE Deficits / Details: AROM WFL, strength hip flexion 3/5, knee extension 4/5, ankle DF 4/5 LLE Sensation: decreased light touch   Cervical / Trunk Assessment Cervical / Trunk Assessment: Kyphotic;Neck Surgery   Communication Communication Communication: No difficulties   Cognition Arousal/Alertness: Awake/alert Behavior During Therapy: Anxious Overall Cognitive Status: History of cognitive impairments - at baseline         General Comments: history of dementia     General Comments  VSS on 2.5L O2. Son in room and supportive. 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            Home Living Family/patient expects to be discharged to:: Assisted living   Bathroom Shower/Tub: Walk-in shower   Home Equipment: Wheelchair - power;Shower seat;Grab bars - tub/shower;Grab bars - toilet   Additional Comments: ALF: [facility]. Live-in girlfriend.      Prior Functioning/Environment Prior Level of Function : Needs assist   Mobility Comments: uses powered w/c, lift recliner ADLs Comments: supervision with bathing        OT Problem List: Decreased strength;Decreased range of motion;Decreased activity tolerance;Impaired balance (sitting and/or standing);Decreased safety awareness;Decreased knowledge of precautions;Decreased knowledge of use of DME or AE;Impaired UE  functional use;Pain      OT Treatment/Interventions: Self-care/ADL training;Therapeutic exercise;Energy conservation;Neuromuscular education;DME and/or AE instruction;Modalities;Manual therapy;Therapeutic activities;Patient/family education;Balance training    OT Goals(Current goals can be found in the care plan section) Acute Rehab OT Goals Patient Stated Goal: to lay down OT Goal Formulation: With patient Time For Goal Achievement: 02/16/23 Potential to Achieve Goals: Good  OT Frequency: Min 2X/week    Co-evaluation PT/OT/SLP Co-Evaluation/Treatment: Yes Reason for Co-Treatment: To address functional/ADL transfers;For patient/therapist safety PT goals addressed during session: Mobility/safety with mobility;Balance OT goals addressed during session: Strengthening/ROM      AM-PAC OT "6 Clicks" Daily Activity     Outcome Measure Help from another person eating meals?: Total Help from another person taking care of personal grooming?: Total Help from another person toileting, which includes using toliet, bedpan, or urinal?: Total Help from another person bathing (including washing, rinsing, drying)?: Total Help from another person to put on and taking off regular upper body clothing?: Total Help from another person to put on and taking off regular lower body clothing?: Total 6 Click Score: 6   End of Session Nurse Communication: Mobility status;Other (comment) (incision leaking)  Activity Tolerance: Patient limited by pain Patient left: in bed;with call bell/phone within reach;with bed alarm set  OT Visit Diagnosis: Unsteadiness on feet (R26.81);Muscle weakness (generalized) (M62.81);Pain Pain - part of body:  (neck)                Time: [id number] OT Time Calculation (min): 26 min Charges:  OT General Charges $OT Visit: 1 Visit OT Evaluation $OT Eval High Complexity: 1 High  Ailene Ravel, OTR/L,CBIS  Supplemental OT - [facility] and [facility] Secure Chat Preferred    Ivory Maduro,  Clarene Duke 02/02/2023, 2:40 PM

## 2023-02-02 NOTE — Consult Note (Addendum)
Initial Consultation Note   Patient: Duane [name] [id number] DOB: April 18, 1935 PCP: Javier Glazier, MD DOA: 02/01/2023 DOS: the patient was seen and examined on 02/02/2023 Primary service: Judith Part, MD  Referring physician: Dr Joaquim Nam Reason for consult: AKI,Hyponatremia  Assessment/Plan: Cervical spine fracture with epidural hematoma-s/p C5-C7 laminectomy, C5-T1 posterior instrumented fusion 3/19 Some postoperative neck pain today Avoid Toradol due to AKI Changing Tylenol to scheduled dosing Minimize narcotics if at all possible-will place on scheduled MiraLAX/senna for bowel regimen Unfortunately appears to have some right upper/right lower extremity weakness still persisting-will need ongoing PT/OT Primary service following  AKI on CKD stage IIIb Likely hemodynamically mediated-baseline creatinine 1.6-1.8 range Will go ahead and stop Toradol Check UA Has a Foley in place-unlikely retaining at this point Renal ultrasound to rule out hydronephrosis/assess renal parenchyma Gently hydrate for a few hours Avoid nephrotoxic agents  Hyponatremia Mild Stable for observation Per son patient has had severe hyponatremia in the past-due to excessive water intake.  PAF Sinus rhythm Continue metoprolol Continue to hold Eliquis-only will be resumed once cleared by neurosurgery given epidural hematoma.  CAD s/p PCI to RCA 2020 No anginal symptoms Prior to admit-on Plavix-this obviously has been on hold due to epidural hematoma-will only be resumed once cleared by neurosurgery  History of CVA 2022 Per son-had right-sided deficits that have essentially resolved with therapy. Plavix/Eliquis on hold  HTN BP reasonable Continue amlodipine/metoprolol Follow/optimize  HLD Intolerant to statin  DM-2 with uncontrolled hyperglycemia Add Semglee-8 units Continue SSI/Tradjenta A1c with a.m. labs  Recent Labs    02/02/23 0710 02/02/23 1126 02/02/23 1659  GLUCAP  265* 272* 228*     OSA Resume CPAP over the next several days  Dementia with delirium At risk for worsening delirium Continue to maintain delirium precautions Continue Namenda/Lexapro Will use Haldol as needed for severe delirium-QTc stable on 3/19.  TRH will continue to follow the patient.  HPI: Duane [name] is a 88 y.o. male with past medical history of PAF on Eliquis, CAD s/p. 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  Patient was reportedly sent to the ED from his chiropractor's office (son is a Restaurant manager, fast food) after he arrived here from his ALF with blood around his mouth, abrasions on his bilateral forearms, and was complaining of severe pain in his upper back/shoulders.  Son suspects that patient may have fallen off his electric wheelchair while being transported from ALF to his chiropractor/son's office.  ECT imaging showed acute nondisplaced fracture involving C6 lamina with medial lateral mass with extension.  While waiting in the emergency room-patient had a significant decline in neurological exam, emergent MRI showed epidural hemorrhage from C1 to at least T2, and known posterior element fractures at C6 and C7.  Patient then underwent emergent C5-C7 laminectomy and evacuation of epidural hematoma on 3/19.  Preoperatively he was given a dose of Kcentra to reverse Eliquis, and a unit of platelets (patient on Plavix).  Postoperatively-patient was noted to have AKI, hyponatremia-hospitalist service was asked to evaluate this patient for further recommendations.  Baseline-patient lives at ALF-and is able to walk short distances.  He has significant peripheral neuropathy secondary diabetes-and for longer distances-he uses his electric wheelchair.  Patient is otherwise independent in other activities of daily living. No known  history of nausea, vomiting, diarrhea, abdominal pain, dysuria, hematuria.  Review of Systems: As  mentioned in the history of present illness. All other systems reviewed and are negative. Past Medical History:  Diagnosis Date   A-fib ([place])    Anemia    Angina pectoris (HCC)    Arthritis    Carpal tunnel syndrome    Cobalamin deficiency    Coronary artery disease    Diabetes mellitus without complication (HCC)    Eczema    Hyperlipemia    Hypertension    Kidney disease, chronic, stage III (moderate, EGFR 30-59 ml/min) (HCC)    Lung infiltrate    Myocardial infarct, old    Prostatic hypertrophy    Sleep apnea, obstructive    Squamous cell cancer of lip    Past Surgical History:  Procedure Laterality Date   CORONARY ANGIOPLASTY WITH STENT PLACEMENT     POSTERIOR CERVICAL FUSION/FORAMINOTOMY N/A 02/01/2023   Procedure: CERVICAL FIVE - THORACIC ONE LAMINECTOMY, AND INSTRUMENTED FUSION;  Surgeon: Judith Part, MD;  Location: [facility];  Service: Neurosurgery;  Laterality: N/A;   Social History:  reports that he has never smoked. He has never used smokeless tobacco. He reports that he does not drink alcohol. No history on file for drug use.  Allergies  Allergen Reactions   Baclofen Other (See Comments)    Severe AMS, comatose, intubated   Bee Venom Anaphylaxis   Contrast Media [Iodinated Contrast Media] Anaphylaxis   Iodine-131 Anaphylaxis   Metrizamide Anaphylaxis   Metronidazole Anaphylaxis   Atorvastatin Other (See Comments)    Aching in legs   Lisinopril Cough    Family History  Problem Relation Age of Onset   Hypertension Other     Prior to Admission medications   Medication Sig Start Date End Date Taking? Authorizing Provider  acetaminophen (TYLENOL) 325 MG tablet Take 2 tablets (650 mg total) by mouth every 6 (six) hours as needed for mild pain, moderate pain or headache. 05/15/21  Yes Hongalgi, Lenis Dickinson, MD  amLODipine (NORVASC) 10 MG tablet Take 10 mg by mouth  daily.   Yes [provider]  apixaban (ELIQUIS) 2.5 MG TABS tablet Take 1 tablet (2.5 mg total) by mouth 2 (two) times daily. Increase to 5 Mg 2 times daily when creatinine less than 1.5 on follow-up BMP check. 05/15/21  Yes Hongalgi, Lenis Dickinson, MD  Ascorbic Acid (VITAMIN C) 1000 MG tablet Take 1,000 mg by mouth daily.   Yes [provider]  B Complex-C (SUPER B-COMPLEX + VITAMIN C PO) Take 1 tablet by mouth daily. uper b complex + c 150mg  tablet 04/21/21  Yes [provider]  calcium carbonate (TUMS - DOSED IN MG ELEMENTAL CALCIUM) 500 MG chewable tablet Chew 2 tablets by mouth every 6 (six) hours as needed for indigestion.   Yes [provider]  clopidogrel (PLAVIX) 75 MG tablet Take 75 mg by mouth daily.   Yes [provider]  EPINEPHrine 0.3 mg/0.3 mL IJ SOAJ injection Inject 0.3 mg into the muscle once as needed (severe allergic reaction).   Yes [provider]  escitalopram (LEXAPRO) 5 MG tablet Take 5 mg by mouth daily.   Yes [provider]  fenofibrate 160 MG tablet Take 1 tablet (160 mg total) by mouth daily. 05/15/21  Yes Hongalgi, Lenis Dickinson, MD  insulin detemir (LEVEMIR) 100 UNIT/ML injection Inject 0.05 mLs (5 Units total) into the skin at bedtime. Patient taking differently: Inject 9 Units into the skin at bedtime. 05/15/21  Yes Hongalgi, Lenis Dickinson, MD  insulin lispro (HUMALOG) 100 UNIT/ML injection Inject  0-8 Units into the skin 3 (three) times daily before meals. 70-178= 0 units, 180-200= 1 unit, 201-250= 3 units, 251-300= 5 units, 301-350= 6 units, 351-400= 8 units   Yes [provider]  memantine (NAMENDA) 5 MG tablet Take 5 mg by mouth daily.   Yes [provider]  metFORMIN (GLUCOPHAGE) 500 MG tablet Take 500 mg by mouth 2 (two) times daily. 01/08/23  Yes [provider]  metoprolol succinate (TOPROL-XL) 50 MG 24 hr tablet Take 50 mg by mouth daily. 10/26/22  Yes [provider]  nitroGLYCERIN  (NITROSTAT) 0.4 MG SL tablet Place 0.4 mg under the tongue every 5 (five) minutes as needed for chest pain.   Yes [provider]  Omega-3 Fatty Acids (ULTRA OMEGA 3 PO) Take 2 capsules by mouth every evening.   Yes [provider]  pantoprazole (PROTONIX) 20 MG tablet Take 20 mg by mouth daily.   Yes [provider]  Probiotic Product (PROBIOTIC PO) Take 1 capsule by mouth daily. Saccharomyces boulardii   Yes [provider]  QC CALCIUM 500MG -D3 500-200 MG-UNIT TABS Take 1 tablet by mouth daily. 04/10/21  Yes [provider]  sitaGLIPtin (JANUVIA) 25 MG tablet Take 25 mg by mouth daily.   Yes [provider]  vitamin B-12 (CYANOCOBALAMIN) 500 MCG tablet Take 500 mcg by mouth daily. 10/20/22  Yes [provider]  ZINC GLYCINATE PO Take 30 mg by mouth daily.   Yes [provider]    Physical Exam: Vitals:   02/02/23 1300 02/02/23 1400 02/02/23 1500 02/02/23 1530  BP: 138/65 (!) 152/63 (!) 146/63   Pulse: 75 73 69 75  Resp: 13 11 16 15   Temp:      TempSrc:      SpO2: 96% 95% 95% 92%  Weight:      Height:       Gen Exam:Alert awake-not in any distress HEENT:atraumatic, normocephalic Chest: B/L clear to auscultation anteriorly CVS:S1S2 regular Abdomen:soft non tender, non distended Extremities:no edema Neurology: Approximately 3/5 strength in RUE/RLE-left upper extremity strength appears adequate. Skin: no rash    Data Reviewed:     Latest Ref Rng & Units 02/02/2023    4:46 AM 02/01/2023    1:16 PM 05/12/2021   10:19 AM  CBC  WBC 4.0 - 10.5 K/uL 12.9  20.6  7.5   Hemoglobin 13.0 - 17.0 g/dL 8.5  11.1  12.5   Hematocrit 39.0 - 52.0 % 26.6  34.6  37.1   Platelets 150 - 400 K/uL 212  255  197        Latest Ref Rng & Units 02/02/2023    4:46 AM 02/01/2023    1:16 PM 05/15/2021    3:00 AM  BMP  Glucose 70 - 99 mg/dL 258  212  168   BUN 8 - 23 mg/dL 25  19  12    Creatinine 0.61 - 1.24 mg/dL 2.04  2.16  1.53    Sodium 135 - 145 mmol/L 132  134  137   Potassium 3.5 - 5.1 mmol/L 4.0  3.9  4.6   Chloride 98 - 111 mmol/L 100  102  108   CO2 22 - 32 mmol/L 20  21  25    Calcium 8.9 - 10.3 mg/dL 8.3  9.0  8.5      Family Communication: Son/grandson at bedside   Thank you very much for involving [place] in the care of your patient.  Author: Oren Binet, MD 02/02/2023 4:05 PM  For on call review [email or web address].

## 2023-02-02 NOTE — Inpatient Diabetes Management (Signed)
Inpatient Diabetes Program Recommendations  AACE/ADA: New Consensus Statement on Inpatient Glycemic Control (2015)  Target Ranges:  Prepandial:   less than 140 mg/dL      Peak postprandial:   less than 180 mg/dL (1-2 hours)      Critically ill patients:  140 - 180 mg/dL   Lab Results  Component Value Date   GLUCAP 272 (H) 02/02/2023   HGBA1C 8.6 (H) 05/12/2021    Review of Glycemic Control  Diabetes history: DM 2 Outpatient Diabetes medications: Levemir 5 QHS, metformin 1000 mg BID, Januvia 25 mg QD, Humalog 0-8 TID  Current orders for Inpatient glycemic control:  Tradjenta 5 mg Daily Novolog 0-15 units tid  Inpatient Diabetes Program Recommendations:    Note: Pt on basal insulin at home. Glucose trends in 200 range consistently  -  Add Levemir 5 units  Thanks,  Tama Headings RN, MSN, BC-ADM Inpatient Diabetes Coordinator Team Pager [phone] (8a-5p)

## 2023-02-02 NOTE — Inpatient Diabetes Management (Signed)
Inpatient Diabetes Program Recommendations  AACE/ADA: New Consensus Statement on Inpatient Glycemic Control (2015)  Target Ranges:  Prepandial:   less than 140 mg/dL      Peak postprandial:   less than 180 mg/dL (1-2 hours)      Critically ill patients:  140 - 180 mg/dL   Lab Results  Component Value Date   GLUCAP 272 (H) 02/02/2023   HGBA1C 8.6 (H) 05/12/2021    Review of Glycemic Control  Diabetes history: DM2 Outpatient Diabetes medications: Januvia 25 mg QD, metformin 1000 mg BID, Levemir 5 QHS Current orders for Inpatient glycemic control: Novolog 0-15 TID, tradjenta 5 mg QD  Inpatient Diabetes Program Recommendations:    Consider adding Levemir 5 units QHS  CBGs likely elevated from steroids.  Will follow.  Thank you. Lorenda Peck, RD, LDN, [place] Inpatient Diabetes Coordinator [phone]

## 2023-02-02 NOTE — Progress Notes (Signed)
Neurosurgery Service Progress Note  Subjective: No acute events overnight. Reports he is feeling better with improved strength. Minimal neck pain. No voiced complaints.    Objective: Vitals:   02/02/23 0700 02/02/23 0745 02/02/23 0800 02/02/23 0815  BP: (!) 134/58  139/62   Pulse: 65 71 67 65  Resp: 11 14 10 12   Temp:   (!) 97.2 F (36.2 C)   TempSrc:   Axillary   SpO2: 93% 92% 91% 91%  Weight:      Height:        Physical Exam: Strength 5/5 in LUE and LLE, Strength 3+/5 in RUE and RLE,  and SILTx4, incision c/d/I   Assessment & Plan: 88 y.o. male s/p C5-C7 laminectomies for evacuation of epidural hematoma and C5-T1 posterior instrumented fusion, recovering well.  -PT/OT -continue drain  -SCDs / TEDs   Norm Parcel, PA-C 02/02/23 9:23 AM

## 2023-02-02 NOTE — Transfer of Care (Signed)
Immediate Anesthesia Transfer of Care Note  Patient: Duane [name]  Procedure(s) Performed: CERVICAL FIVE - THORACIC ONE LAMINECTOMY, AND INSTRUMENTED FUSION (Neck)  Patient Location: PACU  Anesthesia Type:General  Level of Consciousness: drowsy  Airway & Oxygen Therapy: Patient Spontanous Breathing and Patient connected to face mask oxygen  Post-op Assessment: Report given to RN, Post -op Vital signs reviewed and stable, and Patient moving all extremities X 4  Post vital signs: Reviewed and stable  Last Vitals:  Vitals Value Taken Time  BP 160/52 02/02/23 02:33  Temp    Pulse 65 02/02/23 02:33  Resp 14 02/02/23 02:33  SpO2 99 02/02/23 02:33    Last Pain:  Vitals:   02/01/23 1715  TempSrc: Oral  PainSc:          Complications: No notable events documented.

## 2023-02-02 NOTE — TOC CAGE-AID Note (Signed)
Transition of Care [facility]) - CAGE-AID Screening   Patient Details  Name: Duane [name] MRN: [id number] Date of Birth: Dec 15, 1934  Transition of Care [facility]) CM/SW Contact:    Benard Halsted, [place] Phone Number: 02/02/2023, 9:09 AM   Clinical Narrative: Patient disoriented and unable to participate in screening.    CAGE-AID Screening: Substance Abuse Screening unable to be completed due to: : Patient unable to participate

## 2023-02-03 ENCOUNTER — Inpatient Hospital Stay (HOSPITAL_COMMUNITY): Payer: Medicare Other

## 2023-02-03 DIAGNOSIS — S064XAA Epidural hemorrhage with loss of consciousness status unknown, initial encounter: Secondary | ICD-10-CM | POA: Diagnosis not present

## 2023-02-03 DIAGNOSIS — Z9889 Other specified postprocedural states: Secondary | ICD-10-CM | POA: Diagnosis not present

## 2023-02-03 DIAGNOSIS — S12601A Unspecified nondisplaced fracture of seventh cervical vertebra, initial encounter for closed fracture: Secondary | ICD-10-CM

## 2023-02-03 DIAGNOSIS — S12501A Unspecified nondisplaced fracture of sixth cervical vertebra, initial encounter for closed fracture: Secondary | ICD-10-CM | POA: Diagnosis not present

## 2023-02-03 LAB — SODIUM: Sodium: 131 mmol/L — ABNORMAL LOW (ref 135–145)

## 2023-02-03 LAB — BASIC METABOLIC PANEL
Anion gap: 7 (ref 5–15)
BUN: 26 mg/dL — ABNORMAL HIGH (ref 8–23)
CO2: 22 mmol/L (ref 22–32)
Calcium: 7.5 mg/dL — ABNORMAL LOW (ref 8.9–10.3)
Chloride: 98 mmol/L (ref 98–111)
Creatinine, Ser: 2.18 mg/dL — ABNORMAL HIGH (ref 0.61–1.24)
GFR, Estimated: 29 mL/min — ABNORMAL LOW (ref >60)
Glucose, Bld: 193 mg/dL — ABNORMAL HIGH (ref 70–99)
Potassium: 4 mmol/L (ref 3.5–5.1)
Sodium: 127 mmol/L — ABNORMAL LOW (ref 135–145)

## 2023-02-03 LAB — HEMOGLOBIN A1C
Hgb A1c MFr Bld: 7.5 % — ABNORMAL HIGH (ref 4.8–5.6)
Mean Plasma Glucose: 169 mg/dL

## 2023-02-03 LAB — CBC
HCT: 24.3 % — ABNORMAL LOW (ref 39.0–52.0)
Hemoglobin: 7.6 g/dL — ABNORMAL LOW (ref 13.0–17.0)
MCH: 25.2 pg — ABNORMAL LOW (ref 26.0–34.0)
MCHC: 31.3 g/dL (ref 30.0–36.0)
MCV: 80.5 fL (ref 80.0–100.0)
Platelets: 220 10*3/uL (ref 150–400)
RBC: 3.02 MIL/uL — ABNORMAL LOW (ref 4.22–5.81)
RDW: 15.5 % (ref 11.5–15.5)
WBC: 16.5 10*3/uL — ABNORMAL HIGH (ref 4.0–10.5)
nRBC: 0 % (ref 0.0–0.2)

## 2023-02-03 LAB — GLUCOSE, CAPILLARY
Glucose-Capillary: 199 mg/dL — ABNORMAL HIGH (ref 70–99)
Glucose-Capillary: 168 mg/dL — ABNORMAL HIGH (ref 70–99)
Glucose-Capillary: 145 mg/dL — ABNORMAL HIGH (ref 70–99)
Glucose-Capillary: 128 mg/dL — ABNORMAL HIGH (ref 70–99)

## 2023-02-03 MED ORDER — ORAL CARE MOUTH RINSE: 15.0000 mL | OROMUCOSAL | Status: DC | PRN

## 2023-02-03 NOTE — Progress Notes (Signed)
Physical Therapy Treatment Patient Details Name: Duane [name] MRN: [id number] DOB: 1935-07-05 Today's Date: 02/03/2023   History of Present Illness 88 y.o. male admitted 02/01/23 after suspected fall and found to have bilateral shoulder pain, and blood in his mouth.  Patient found to have epidural hematoma and cervical spinal ligamentous injury and change in alignment.   He underwent C5-C7 laminectomies for evacuation of epidural hematoma and C5-T1 posterior instrumented fusion.  PMH positive for a-fib, prior CVA, arthritis, CAD, DM, Eczema, HTN, HLD, CKD III, sleep apnea, MI, prostatic hypertrophy and MI.    PT Comments    Patient initially reluctant, but rather easily persuaded to participate with risks of inactivity explained. Son present throughout session and encouraging pt as well. Patient reporting 10/10 neck pain, and able to progress to standing with bil UE support x 30 sec. Not able to complete any pre-gait activities. Patient returned to bed as unable to safely pivot/step to chair.     Recommendations for follow up therapy are one component of a multi-disciplinary discharge planning process, led by the attending physician.  Recommendations may be updated based on patient status, additional functional criteria and insurance authorization.  Follow Up Recommendations  Skilled nursing-short term rehab (<3 hours/day) Can patient physically be transported by private vehicle: No   Assistance Recommended at Discharge Frequent or constant Supervision/Assistance  Patient can return home with the following Two people to help with walking and/or transfers;Assistance with cooking/housework;Assist for transportation;Two people to help with bathing/dressing/bathroom   Equipment Recommendations  None recommended by PT    Recommendations for Other Services       Precautions / Restrictions Precautions Precautions: Fall;Cervical Precaution Comments: no brace needed Restrictions Weight  Bearing Restrictions: No     Mobility  Bed Mobility Overal bed mobility: Needs Assistance Bed Mobility: Rolling, Sidelying to Sit, Sit to Sidelying Rolling: Min assist Sidelying to sit: Max assist, +2 for physical assistance     Sit to sidelying: Max assist, +2 for physical assistance General bed mobility comments: assist to lift trunk upright using bed pad under pt to lift;    Transfers Overall transfer level: Needs assistance Equipment used: 2 person hand held assist Transfers: Sit to/from Stand Sit to Stand: Mod assist, +2 physical assistance, From elevated surface (to simulate lift chair)           General transfer comment: pt with assist with anterior wt-shift and boosting assist to fully extend hips/knees into upright standing    Ambulation/Gait             Pre-gait activities: attempted with pt unable to lift or advance either foot     Stairs             Wheelchair Mobility    Modified Rankin (Stroke Patients Only)       Balance Overall balance assessment: Needs assistance Sitting-balance support: Feet unsupported, Single extremity supported Sitting balance-Leahy Scale: Poor Sitting balance - Comments: mod A for balance on EOB with feet unsupported   Standing balance support: Bilateral upper extremity supported Standing balance-Leahy Scale: Poor                              Cognition Arousal/Alertness: Awake/alert Behavior During Therapy: Anxious Overall Cognitive Status: History of cognitive impairments - at baseline  General Comments: history of dementia        Exercises General Exercises - Lower Extremity Ankle Circles/Pumps: AAROM, Both, 10 reps Heel Slides: AROM, Left, AAROM, Right, 5 reps    General Comments General comments (skin integrity, edema, etc.): Patient reporting pain throughout session (unfortunately did not get pre-medicated as requested) and son reports he  has not been tolerating pain well. With encouragement pt participated.      Pertinent Vitals/Pain Pain Assessment Pain Assessment: 0-10 Pain Score: 10-Worst pain ever Pain Location: neck Pain Descriptors / Indicators: Aching, Discomfort, Guarding, Grimacing, Operative site guarding, Moaning Pain Intervention(s): Limited activity within patient's tolerance, Monitored during session, Other (comment), Patient requesting pain meds-RN notified (asked RN to premedicate, however unfortunately he did not)    Home Living                          Prior Function            PT Goals (current goals can now be found in the care plan section) Acute Rehab PT Goals Patient Stated Goal: improve pain Time For Goal Achievement: 02/16/23 Potential to Achieve Goals: Fair Progress towards PT goals: Progressing toward goals    Frequency    Min 3X/week      PT Plan Current plan remains appropriate    Co-evaluation              AM-PAC PT "6 Clicks" Mobility   Outcome Measure  Help needed turning from your back to your side while in a flat bed without using bedrails?: A Little Help needed moving from lying on your back to sitting on the side of a flat bed without using bedrails?: Total Help needed moving to and from a bed to a chair (including a wheelchair)?: Total Help needed standing up from a chair using your arms (e.g., wheelchair or bedside chair)?: Total Help needed to walk in hospital room?: Total Help needed climbing 3-5 steps with a railing? : Total 6 Click Score: 8    End of Session Equipment Utilized During Treatment: Oxygen Activity Tolerance: Patient limited by pain Patient left: in bed;with bed alarm set;with call bell/phone within reach Nurse Communication: Mobility status;Patient requests pain meds PT Visit Diagnosis: Other abnormalities of gait and mobility (R26.89);Muscle weakness (generalized) (M62.81);Pain Pain - Right/Left:  (both) Pain - part of body:   (neck and shoulders)     Time: [id number] PT Time Calculation (min) (ACUTE ONLY): 26 min  Charges:  $Gait Training: 8-22 mins $Therapeutic Activity: 8-22 mins                      [place]  Office [phone]    Rexanne Mano 02/03/2023, 2:13 PM

## 2023-02-03 NOTE — Progress Notes (Signed)
Progress Note   Patient: Duane [name] [id number] DOB: May 26, 1935 DOA: 02/01/2023     2 DOS: the patient was seen and examined on 02/03/2023   Brief hospital course: 88 y.o. male with past medical history of PAF on Eliquis, CAD s/p. 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  Patient was reportedly sent to the ED from his chiropractor's office (son is a Restaurant manager, fast food) after he arrived here from his ALF with blood around his mouth, abrasions on his bilateral forearms, and was complaining of severe pain in his upper back/shoulders.  Son suspects that patient may have fallen off his electric wheelchair while being transported from ALF to his chiropractor/son's office.  ECT imaging showed acute nondisplaced fracture involving C6 lamina with medial lateral mass with extension.  While waiting in the emergency room-patient had a significant decline in neurological exam, emergent MRI showed epidural hemorrhage from C1 to at least T2, and known posterior element fractures at C6 and C7.  Patient then underwent emergent C5-C7 laminectomy and evacuation of epidural hematoma on 3/19.  Preoperatively he was given a dose of Kcentra to reverse Eliquis, and a unit of platelets (patient on Plavix).  Postoperatively-patient was noted to have AKI, hyponatremia-hospitalist service was asked to evaluate this patient for further recommendations.   Baseline-patient lives at ALF-and is able to walk short distances.  He has significant peripheral neuropathy secondary diabetes-and for longer distances-he uses his electric wheelchair.  Patient is otherwise independent in other activities of daily living. No known history of nausea, vomiting, diarrhea, abdominal pain, dysuria, hematuria.  Assessment and Plan: Cervical spine fracture with epidural hematoma-s/p C5-C7  laminectomy, C5-T1 posterior instrumented fusion 3/19 Cont to avoid Toradol due to AKI Cont Tylenol to scheduled dosing Minimize narcotics if at all possible-will place on scheduled MiraLAX/senna for bowel regimen Therapy recs for SNF noted. TOC following Primary service following   AKI on CKD stage IIIb Likely hemodynamically mediated-baseline creatinine 1.6-1.8 range Will go ahead and stop Toradol Has a Foley in place Renal ultrasound reviewed, unremarkable Avoid nephrotoxic agents Now fluid restricting per below Recheck bmet in AM   Hyponatremia Trending down Family reported patient has had severe hyponatremia in the past-due to excessive water intake. -Staff reports pt drinking ample amounts of water throughout the day, documented to have almost 1.5L in yesterday during day shift -Appears euvolemic on exam -placed on 1200cc fluid restriction with repeat Na improved to 131 from 127 -Check urine Na -Recheck bmet in AM   PAF Sinus rhythm Continue metoprolol Continue to hold Eliquis-only will be resumed once cleared by neurosurgery given epidural hematoma.   CAD s/p PCI to RCA 2020 No anginal symptoms Prior to admit-on Plavix-this obviously has been on hold due to epidural hematoma-will only be resumed once cleared by neurosurgery   History of CVA 2022 Per son-had right-sided deficits that have essentially resolved with therapy. Plavix/Eliquis on hold per above   HTN BP stable at present Continue amlodipine/metoprolol   HLD Intolerant to statin   DM-2 with uncontrolled hyperglycemia Cont Semglee-8 units Continue SSI/Tradjenta A1c 7.5      Subjective: Complaining of itching over L shoulder. Otherwise, still feeling thirsty, admitting to drinking ample amounts of water  Physical Exam: Vitals:   02/03/23 0900 02/03/23 1000 02/03/23 1100 02/03/23 1624  BP: 117/84 (!) 128/57 (!) 119/53 136/66  Pulse: 66 62 (!) 58  63  Resp: 11 11 10 12   Temp:    97.9 F (36.6  C)  TempSrc:    Oral  SpO2: 95% 92% 93% 94%  Weight:      Height:       General exam: Awake, laying in bed, in nad, 3 cups of water in front of pt Respiratory system: Normal respiratory effort, no wheezing Cardiovascular system: regular rate, s1, s2 Gastrointestinal system: Soft, nondistended, positive BS Central nervous system: CN2-12 grossly intact, strength intact Extremities: Perfused, no clubbing Skin: Normal skin turgor, no notable skin lesions seen Psychiatry: Mood normal // no visual hallucinations   Data Reviewed:  Labs reviewed: Na 127-131, K 4.0, Cr 2.18, Hgb 7.6   Family Communication: Pt in room, family not at bedside  Disposition: Status is: Inpatient Remains inpatient appropriate because: Severity of illness  Planned Discharge Destination: Skilled nursing facility    Author: Marylu Lund, MD 02/03/2023 5:50 PM  For on call review [email or web address].

## 2023-02-03 NOTE — Hospital Course (Signed)
88 y.o. male with past medical history of PAF on Eliquis, CAD s/p. 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  Patient was reportedly sent to the ED from his chiropractor's office (son is a Restaurant manager, fast food) after he arrived here from his ALF with blood around his mouth, abrasions on his bilateral forearms, and was complaining of severe pain in his upper back/shoulders.  Son suspects that patient may have fallen off his electric wheelchair while being transported from ALF to his chiropractor/son's office.  ECT imaging showed acute nondisplaced fracture involving C6 lamina with medial lateral mass with extension.  While waiting in the emergency room-patient had a significant decline in neurological exam, emergent MRI showed epidural hemorrhage from C1 to at least T2, and known posterior element fractures at C6 and C7.  Patient then underwent emergent C5-C7 laminectomy and evacuation of epidural hematoma on 3/19.  Preoperatively he was given a dose of Kcentra to reverse Eliquis, and a unit of platelets (patient on Plavix).  Postoperatively-patient was noted to have AKI, hyponatremia-hospitalist service was asked to evaluate this patient for further recommendations.   Baseline-patient lives at ALF-and is able to walk short distances.  He has significant peripheral neuropathy secondary diabetes-and for longer distances-he uses his electric wheelchair.  Patient is otherwise independent in other activities of daily living. No known history of nausea, vomiting, diarrhea, abdominal pain, dysuria, hematuria.

## 2023-02-03 NOTE — Anesthesia Postprocedure Evaluation (Signed)
Anesthesia Post Note  Patient: Duane [name]  Procedure(s) Performed: CERVICAL FIVE - THORACIC ONE LAMINECTOMY, AND INSTRUMENTED FUSION (Neck)     Patient location during evaluation: PACU Anesthesia Type: General Level of consciousness: awake and alert Pain management: pain level controlled Vital Signs Assessment: post-procedure vital signs reviewed and stable Respiratory status: spontaneous breathing, nonlabored ventilation, respiratory function stable and patient connected to nasal cannula oxygen Cardiovascular status: blood pressure returned to baseline and stable Postop Assessment: no apparent nausea or vomiting Anesthetic complications: no   No notable events documented.  Last Vitals:  Vitals:   02/03/23 0700 02/03/23 0800  BP: (!) 128/54 (!) 130/51  Pulse: 70 63  Resp: 10 10  Temp:  36.5 C  SpO2: 90% 96%    Last Pain:  Vitals:   02/03/23 0800  TempSrc: Oral  PainSc: Asleep                 Tiajuana Amass

## 2023-02-03 NOTE — TOC Initial Note (Signed)
Transition of Care [facility]) - Initial/Assessment Note    Patient Details  Name: Duane [name] MRN: [id number] Date of Birth: 03/25/1935  [facility]) CM/SW Contact:    Benard Halsted, LCSW Phone Number: 02/03/2023, 11:34 AM  Clinical Narrative:                 CSW spoke with patient's son, Mikki Santee, to discuss discharge plan. He asked if patient could go to CIR. CSW made therapy aware to see if patient can tolerate it. If not, plan will be to go to the SNF rehab side of [facility]. Admissions at [facility] aware and request insurance authorization for patient if he chooses SNF.     Expected Discharge Plan: Skilled Nursing Facility Barriers to Discharge: Continued Medical Work up, SNF Pending bed offer   Patient Goals and CMS Choice Patient states their goals for this hospitalization and ongoing recovery are:: Rehab CMS Medicare.gov Compare Post Acute Care list provided to:: Patient Represenative (must comment) Choice offered to / list presented to : Adult [place] ownership interest in [facility].provided to:: Adult Children    Expected Discharge Plan and Services In-house Referral: Clinical Social Work   Post Acute Care Choice: IP Rehab, [place] Living arrangements for the past 2 months: [place]                                      Prior Living Arrangements/Services Living arrangements for the past 2 months: [place] Lives with:: Facility Resident Patient language and need for interpreter reviewed:: Yes Do you feel safe going back to the place where you live?: Yes      Need for Family Participation in Patient Care: Yes (Comment) Care giver support system in place?: Yes (comment)   Criminal Activity/Legal Involvement Pertinent to Current Situation/Hospitalization: No - Comment as needed  Activities of Daily Living      Permission Sought/Granted Permission sought to share  information with : Facility Sport and exercise psychologist, Family Supports Permission granted to share information with : No  Share Information with NAME: Mikki Santee  Permission granted to share info w AGENCY: [facility]  Permission granted to share info w Relationship: Son  Permission granted to share info w Contact Information: [phone]  Emotional Assessment Appearance:: Appears stated age Attitude/Demeanor/Rapport: Unable to Assess Affect (typically observed): Unable to Assess Orientation: : Oriented to Self, Oriented to Place Alcohol / Substance Use: Not Applicable Psych Involvement: No (comment)  Admission diagnosis:  Epidural hematoma ([place]) [S06.4XAA] Closed nondisplaced fracture of seventh cervical vertebra, unspecified fracture morphology, initial encounter ([place]) [S12.601A] Closed nondisplaced fracture of sixth cervical vertebra, unspecified fracture morphology, initial encounter ([place]) [S12.501A] Status post surgery [Z98.890] Closed cervical spine fracture ([place]) [S12.9XXA] Patient Active Problem List   Diagnosis Date Noted   Closed cervical spine fracture ([place]) 02/02/2023   Status post surgery 02/01/2023   OSA (obstructive sleep apnea)    Overweight (BMI 25.0-29.9) 05/12/2021   History of CVA (cerebrovascular accident) 05/11/2021   CKD (chronic kidney disease), stage III ([place]) 05/11/2021   Hyperlipidemia 05/11/2021   Hyponatremia 09/05/2019   Diarrhea 09/05/2019   UTI (urinary tract infection) 09/05/2019   Encephalopathy 07/23/2019   Essential hypertension 09/08/2016   PAF (paroxysmal atrial fibrillation) ([place]) 09/08/2016   Diabetes mellitus type 2, controlled ([place]) 09/08/2016   ARF (acute renal failure) ([place]) 09/08/2016   Normochromic normocytic anemia 09/08/2016  Abdominal pain 09/08/2016   PCP:  Javier Glazier, MD Pharmacy:   [facility] [place]  [place] [address] Phone:  [phone] Fax: [phone]     Social Determinants of Health (SDOH) Social History: SDOH Screenings   Food Insecurity: No Food Insecurity (02/03/2023)  Housing: Low Risk  (02/03/2023)  Transportation Needs: No Transportation Needs (02/03/2023)  Utilities: Not At Risk (02/03/2023)  Tobacco Use: Low Risk  (02/02/2023)   SDOH Interventions:     Readmission Risk Interventions     No data to display

## 2023-02-03 NOTE — Progress Notes (Signed)
Neurosurgery Service Progress Note  Subjective: No acute events overnight, confused this morning but otherwise doing well   Objective: Vitals:   02/03/23 0300 02/03/23 0400 02/03/23 0500 02/03/23 0600  BP: (!) 123/54 (!) 124/50 (!) 134/51 (!) 130/52  Pulse: 72 63 65 66  Resp: 13 10 13 11   Temp:  98 F (36.7 C)    TempSrc:  Oral    SpO2: 96% 96% 95% 94%  Weight:      Height:        Physical Exam: Ox1, strength 4 to 4+/5 in BUE, 4+/5 in BLE, incision c/d/I, drain w/ serosang output  Assessment & Plan: 88 y.o. man s/p fracture stabilization, evac of epidural hematoma, recovering well.  -transfer out of unit -continue drain -hold plavix until POD10 -hold eliquis for now - will need to discuss risks / benefits  Judith Part  02/03/23 7:42 AM

## 2023-02-03 NOTE — NC FL2 (Signed)
[place] LEVEL OF CARE FORM     IDENTIFICATION  Patient Name: Duane [name] Birthdate: Aug 24, 1935 Sex: male Admission Date (Current Location): 02/01/2023  [facility] and [place] Number:  Herbalist and Address:  [facility], [place] [address], [place], [place] [address]      Provider Number: [id number]  Attending Physician Name and Address:  Judith Part, MD  Relative Name and Phone Number:       Current Level of Care: Hospital Recommended Level of Care: [place] Prior Approval Number:    Date Approved/Denied:   PASRR Number: [id number]  Discharge Plan: SNF    Current Diagnoses: Patient Active Problem List   Diagnosis Date Noted   Closed cervical spine fracture ([place]) 02/02/2023   Status post surgery 02/01/2023   OSA (obstructive sleep apnea)    Overweight (BMI 25.0-29.9) 05/12/2021   History of CVA (cerebrovascular accident) 05/11/2021   CKD (chronic kidney disease), stage III ([place]) 05/11/2021   Hyperlipidemia 05/11/2021   Hyponatremia 09/05/2019   Diarrhea 09/05/2019   UTI (urinary tract infection) 09/05/2019   Encephalopathy 07/23/2019   Essential hypertension 09/08/2016   PAF (paroxysmal atrial fibrillation) ([place]) 09/08/2016   Diabetes mellitus type 2, controlled ([place]) 09/08/2016   ARF (acute renal failure) ([place]) 09/08/2016   Normochromic normocytic anemia 09/08/2016   Abdominal pain 09/08/2016    Orientation RESPIRATION BLADDER Height & Weight     Self, Place  O2 (1L nasal cannula) Continent, External catheter Weight: 208 lb 5.4 oz (94.5 kg) Height:  5\' 11"  (180.3 cm)  BEHAVIORAL SYMPTOMS/MOOD NEUROLOGICAL BOWEL NUTRITION STATUS      Continent Diet (See dc summary)  AMBULATORY STATUS COMMUNICATION OF NEEDS Skin   Extensive Assist Verbally Surgical wounds (JP drain site; closed incision on neck)                       Personal Care Assistance Level of Assistance  Bathing,  Feeding, Dressing Bathing Assistance: Limited assistance Feeding assistance: Maximum assistance Dressing Assistance: Maximum assistance     Functional Limitations Info             SPECIAL CARE FACTORS FREQUENCY  PT (By licensed PT), OT (By licensed OT)     PT Frequency: 5x/week OT Frequency: 5x/week            Contractures Contractures Info: Not present    Additional Factors Info  Code Status, Allergies, Psychotropic, Insulin Sliding Scale Code Status Info: Full Allergies Info: Baclofen, Bee Venom, Contrast Media (Iodinated Contrast Media), Iodine-131, Metrizamide, Metronidazole, Atorvastatin, Lisinopril Psychotropic Info: Lexapro Insulin Sliding Scale Info: See dc summary       Current Medications (02/03/2023):  This is the current hospital active medication list Current Facility-Administered Medications  Medication Dose Route Frequency Provider Last Rate Last Admin   0.9 %  sodium chloride infusion (Manually program via Guardrails IV Fluids)   Intravenous Once Junie Bame B, CRNA       0.9 %  sodium chloride infusion  250 mL Intravenous Continuous Judith Part, MD   Stopped at 02/02/23 1806   acetaminophen (TYLENOL) tablet 1,000 mg  1,000 mg Oral Q8H Ghimire, Shanker M, MD   1,000 mg at 02/03/23 0640   amLODipine (NORVASC) tablet 10 mg  10 mg Oral Daily Judith Part, MD   10 mg at 02/03/23 [id number]   Chlorhexidine Gluconate Cloth 2 % PADS 6 each  6 each Topical q morning Ostergard,  Joyice Faster, MD   6 each at 02/03/23 [id number]   escitalopram (LEXAPRO) tablet 5 mg  5 mg Oral Daily Judith Part, MD   5 mg at 02/03/23 [id number]   fenofibrate tablet 160 mg  160 mg Oral Daily Judith Part, MD   160 mg at 02/03/23 0929   HYDROmorphone (DILAUDID) injection 1 mg  1 mg Intravenous Q3H PRN Judith Part, MD   1 mg at 02/03/23 0640   insulin aspart (novoLOG) injection 0-15 Units  0-15 Units Subcutaneous TID WC Judith Part, MD   3 Units at 02/03/23 1139    insulin glargine-yfgn [facility]) injection 8 Units  8 Units Subcutaneous Daily Jonetta Osgood, MD   8 Units at 02/03/23 [id number]   linagliptin (TRADJENTA) tablet 5 mg  5 mg Oral Daily Judith Part, MD   5 mg at 02/03/23 [id number]   memantine (NAMENDA) tablet 5 mg  5 mg Oral Daily Judith Part, MD   5 mg at 02/03/23 [id number]   menthol-cetylpyridinium (CEPACOL) lozenge 3 mg  1 lozenge Oral PRN Judith Part, MD       Or   phenol (CHLORASEPTIC) mouth spray 1 spray  1 spray Mouth/Throat PRN Judith Part, MD       metoprolol succinate (TOPROL-XL) 24 hr tablet 50 mg  50 mg Oral Daily Judith Part, MD   50 mg at 02/03/23 [id number]   nitroGLYCERIN (NITROSTAT) SL tablet 0.4 mg  0.4 mg Sublingual Q5 min PRN Judith Part, MD       ondansetron (ZOFRAN) tablet 4 mg  4 mg Oral Q6H PRN Judith Part, MD       Or   ondansetron (ZOFRAN) injection 4 mg  4 mg Intravenous Q6H PRN Judith Part, MD       Oral care mouth rinse  15 mL Mouth Rinse PRN Judith Part, MD       oxyCODONE (Oxy IR/ROXICODONE) immediate release tablet 10 mg  10 mg Oral Q4H PRN Judith Part, MD   10 mg at 02/03/23 [id number]   oxyCODONE (Oxy IR/ROXICODONE) immediate release tablet 5 mg  5 mg Oral Q4H PRN Judith Part, MD   5 mg at 02/02/23 1221   pantoprazole (PROTONIX) EC tablet 20 mg  20 mg Oral Daily Judith Part, MD   20 mg at 02/03/23 0929   polyethylene glycol (MIRALAX / GLYCOLAX) packet 17 g  17 g Oral Daily Jonetta Osgood, MD   17 g at 02/03/23 [id number]   sodium chloride flush (NS) 0.9 % injection 3 mL  3 mL Intravenous Q12H Judith Part, MD   3 mL at 02/03/23 0929   sodium chloride flush (NS) 0.9 % injection 3 mL  3 mL Intravenous PRN Judith Part, MD         Discharge Medications: Please see discharge summary for a list of discharge medications.  Relevant Imaging Results:  Relevant Lab Results:   Additional Information SS#: [id number]  Benard Halsted,  LCSW

## 2023-02-04 DIAGNOSIS — Z9889 Other specified postprocedural states: Secondary | ICD-10-CM | POA: Diagnosis not present

## 2023-02-04 DIAGNOSIS — S12601A Unspecified nondisplaced fracture of seventh cervical vertebra, initial encounter for closed fracture: Secondary | ICD-10-CM | POA: Diagnosis not present

## 2023-02-04 DIAGNOSIS — S12501A Unspecified nondisplaced fracture of sixth cervical vertebra, initial encounter for closed fracture: Secondary | ICD-10-CM | POA: Diagnosis not present

## 2023-02-04 DIAGNOSIS — S064XAA Epidural hemorrhage with loss of consciousness status unknown, initial encounter: Secondary | ICD-10-CM | POA: Diagnosis not present

## 2023-02-04 LAB — COMPREHENSIVE METABOLIC PANEL
ALT: 11 U/L (ref 0–44)
AST: 40 U/L (ref 15–41)
Albumin: 2.6 g/dL — ABNORMAL LOW (ref 3.5–5.0)
Alkaline Phosphatase: 49 U/L (ref 38–126)
Anion gap: 10 (ref 5–15)
BUN: 30 mg/dL — ABNORMAL HIGH (ref 8–23)
CO2: 18 mmol/L — ABNORMAL LOW (ref 22–32)
Calcium: 7.6 mg/dL — ABNORMAL LOW (ref 8.9–10.3)
Chloride: 101 mmol/L (ref 98–111)
Creatinine, Ser: 2.06 mg/dL — ABNORMAL HIGH (ref 0.61–1.24)
GFR, Estimated: 31 mL/min — ABNORMAL LOW (ref >60)
Glucose, Bld: 159 mg/dL — ABNORMAL HIGH (ref 70–99)
Potassium: 4.4 mmol/L (ref 3.5–5.1)
Sodium: 129 mmol/L — ABNORMAL LOW (ref 135–145)
Total Bilirubin: 0.7 mg/dL (ref 0.3–1.2)
Total Protein: 5.6 g/dL — ABNORMAL LOW (ref 6.5–8.1)

## 2023-02-04 LAB — GLUCOSE, CAPILLARY
Glucose-Capillary: 183 mg/dL — ABNORMAL HIGH (ref 70–99)
Glucose-Capillary: 205 mg/dL — ABNORMAL HIGH (ref 70–99)
Glucose-Capillary: 170 mg/dL — ABNORMAL HIGH (ref 70–99)
Glucose-Capillary: 190 mg/dL — ABNORMAL HIGH (ref 70–99)

## 2023-02-04 LAB — SODIUM, URINE, RANDOM: Sodium, Ur: <10 mmol/L

## 2023-02-04 MED ORDER — CHLORHEXIDINE GLUCONATE CLOTH 2 % EX PADS
6.0000 | MEDICATED_PAD | Freq: Every day | CUTANEOUS | Status: DC
  Administered 2023-02-04: 6 via TOPICAL

## 2023-02-04 MED ORDER — LIDOCAINE 5 % EX PTCH
1.0000 | MEDICATED_PATCH | CUTANEOUS | Status: DC
  Administered 2023-02-04 – 2023-02-10 (×7): 1 via TRANSDERMAL
  Filled 2023-02-04 (×7): qty 1

## 2023-02-04 NOTE — Progress Notes (Signed)
Physical Therapy Treatment Patient Details Name: Duane [name] MRN: [id number] DOB: 1935-10-12 Today's Date: 02/04/2023   History of Present Illness 88 y.o. male admitted 02/01/23 after suspected fall and found to have bilateral shoulder pain, and blood in his mouth.  Patient found to have epidural hematoma and cervical spinal ligamentous injury and change in alignment.   He underwent C5-C7 laminectomies for evacuation of epidural hematoma and C5-T1 posterior instrumented fusion.  PMH positive for a-fib, prior CVA, arthritis, CAD, DM, Eczema, HTN, HLD, CKD III, sleep apnea, MI, prostatic hypertrophy and MI.    PT Comments    Seen today while nursing attempting to help pt stand to urinate.  Patient continues to be limited by pain in his neck, though tolerated more today than previous sessions and with slight improved sitting balance.  Able to use soft collar for comfort as well.  Patient remains appropriate for SNF level rehab at d/c.  PT will continue to follow in acute setting.    Recommendations for follow up therapy are one component of a multi-disciplinary discharge planning process, led by the attending physician.  Recommendations may be updated based on patient status, additional functional criteria and insurance authorization.  Follow Up Recommendations  Skilled nursing-short term rehab (<3 hours/day) Can patient physically be transported by private vehicle: No   Assistance Recommended at Discharge Frequent or constant Supervision/Assistance  Patient can return home with the following Two people to help with walking and/or transfers;Assistance with cooking/housework;Assist for transportation;Two people to help with bathing/dressing/bathroom   Equipment Recommendations  None recommended by PT    Recommendations for Other Services       Precautions / Restrictions Precautions Precautions: Fall Precaution Comments: no brace needed per order, but MD approved soft collar for  comfort Required Braces or Orthoses: Cervical Brace Cervical Brace: For comfort;Soft collar     Mobility  Bed Mobility Overal bed mobility: Needs Assistance Bed Mobility: Sit to Supine       Sit to supine: Max assist, +2 for physical assistance   General bed mobility comments: assisted to support trunk to lower as NT brought up legs and +2 to scoot up to [facility]    Transfers Overall transfer level: Needs assistance Equipment used: 2 person hand held assist   Sit to Stand: Max assist, +2 physical assistance           General transfer comment: up to stand from EOB with NT helping using belt and pad under pt to lift hips; pt continued to c/o pain and unable to extend hips for full upright standing; performed x 3 as trying to see if pt able to urinate    Ambulation/Gait                   Stairs             Wheelchair Mobility    Modified Rankin (Stroke Patients Only)       Balance Overall balance assessment: Needs assistance Sitting-balance support: Feet supported Sitting balance-Leahy Scale: Poor Sitting balance - Comments: able to sit unsupported or with minguard A briefly, but not well tolerated, pt wanting to have support   Standing balance support: Bilateral upper extremity supported Standing balance-Leahy Scale: Zero Standing balance comment: +2 for standing today                            Cognition Arousal/Alertness: Awake/alert Behavior During Therapy: Anxious Overall Cognitive Status: History of cognitive  impairments - at baseline                                 General Comments: history of dementia        Exercises      General Comments General comments (skin integrity, edema, etc.): pt's daughter and girlfriend in the room as well as RN and NT.  HR up to 100's with attempts to stand and with pain; but pt unable to urinate so RN did cath after return to supine.      Pertinent Vitals/Pain Pain  Assessment Faces Pain Scale: Hurts whole lot Pain Location: neck Pain Descriptors / Indicators: Aching, Discomfort, Guarding, Grimacing, Operative site guarding, Moaning Pain Intervention(s): Monitored during session, Limited activity within patient's tolerance, Repositioned, Premedicated before session    Home Living                          Prior Function            PT Goals (current goals can now be found in the care plan section) Progress towards PT goals: Progressing toward goals    Frequency           PT Plan Current plan remains appropriate    Co-evaluation              AM-PAC PT "6 Clicks" Mobility   Outcome Measure  Help needed turning from your back to your side while in a flat bed without using bedrails?: A Little Help needed moving from lying on your back to sitting on the side of a flat bed without using bedrails?: Total Help needed moving to and from a bed to a chair (including a wheelchair)?: Total Help needed standing up from a chair using your arms (e.g., wheelchair or bedside chair)?: Total Help needed to walk in hospital room?: Total Help needed climbing 3-5 steps with a railing? : Total 6 Click Score: 8    End of Session Equipment Utilized During Treatment: Gait belt;Cervical collar Activity Tolerance: Patient limited by pain Patient left: in bed;with call bell/phone within reach;with family/visitor present;with nursing/sitter in room   PT Visit Diagnosis: Other abnormalities of gait and mobility (R26.89);Muscle weakness (generalized) (M62.81);Pain Pain - part of body:  (neck)     Time: [id number] PT Time Calculation (min) (ACUTE ONLY): 15 min  Charges:  $Therapeutic Activity: 8-22 mins                     Magda Kiel, PT Acute Rehabilitation Services Office:[phone] 02/04/2023    Reginia Naas 02/04/2023, 5:15 PM

## 2023-02-04 NOTE — Consult Note (Signed)
Urology Consult   Physician requesting consult: Marylu Lund, MD  Reason for consult: Urinary retention, inflated penile prosthesis  History of Present Illness: Duane [name] is a 88 y.o. who has a significant past medical history of PAF on Eliquis, CAD, CVA, DM, HTN, dementia who was admitted with cervical spine fracture with epidural hematoma after suffering a fall.  He underwent emergent C5-C7 laminectomy and evacuation of epidural hematoma on 02/01/2023.  Over the hospitalization, he has been found to have incomplete bladder emptying and Foley catheter was placed earlier today with return of clear yellow urine.  Nursing noticed that his penile prosthesis was inflated and thus urology was consulted to deflate the prosthesis.  He underwent Coloplast penile prosthesis in 05/2018 by Dr. Fenton Malling in [place].  Patient denies any pain or related pain.  He is unaware that his prosthesis is inflated.  He is tolerating Foley catheter without difficulty.  He is unsure the last time he uses prosthesis.  Past Medical History:  Diagnosis Date   A-fib ([place])    Anemia    Angina pectoris (HCC)    Arthritis    Carpal tunnel syndrome    Cobalamin deficiency    Coronary artery disease    Diabetes mellitus without complication (HCC)    Eczema    Hyperlipemia    Hypertension    Kidney disease, chronic, stage III (moderate, EGFR 30-59 ml/min) (HCC)    Lung infiltrate    Myocardial infarct, old    Prostatic hypertrophy    Sleep apnea, obstructive    Squamous cell cancer of lip     Past Surgical History:  Procedure Laterality Date   CORONARY ANGIOPLASTY WITH STENT PLACEMENT     POSTERIOR CERVICAL FUSION/FORAMINOTOMY N/A 02/01/2023   Procedure: CERVICAL FIVE - THORACIC ONE LAMINECTOMY, AND INSTRUMENTED FUSION;  Surgeon: Judith Part, MD;  Location: [facility];  Service: Neurosurgery;  Laterality: N/A;    Current Hospital Medications:  Home Meds:  No current  facility-administered medications on file prior to encounter.   Current Outpatient Medications on File Prior to Encounter  Medication Sig Dispense Refill   acetaminophen (TYLENOL) 325 MG tablet Take 2 tablets (650 mg total) by mouth every 6 (six) hours as needed for mild pain, moderate pain or headache.     amLODipine (NORVASC) 10 MG tablet Take 10 mg by mouth daily.     apixaban (ELIQUIS) 2.5 MG TABS tablet Take 1 tablet (2.5 mg total) by mouth 2 (two) times daily. Increase to 5 Mg 2 times daily when creatinine less than 1.5 on follow-up BMP check.     Ascorbic Acid (VITAMIN C) 1000 MG tablet Take 1,000 mg by mouth daily.     B Complex-C (SUPER B-COMPLEX + VITAMIN C PO) Take 1 tablet by mouth daily. uper b complex + c 150mg  tablet     calcium carbonate (TUMS - DOSED IN MG ELEMENTAL CALCIUM) 500 MG chewable tablet Chew 2 tablets by mouth every 6 (six) hours as needed for indigestion.     clopidogrel (PLAVIX) 75 MG tablet Take 75 mg by mouth daily.     EPINEPHrine 0.3 mg/0.3 mL IJ SOAJ injection Inject 0.3 mg into the muscle once as needed (severe allergic reaction).     escitalopram (LEXAPRO) 5 MG tablet Take 5 mg by mouth daily.     fenofibrate 160 MG tablet Take 1 tablet (160 mg total) by mouth daily.     insulin detemir (LEVEMIR) 100 UNIT/ML injection Inject 0.05 mLs (  5 Units total) into the skin at bedtime. (Patient taking differently: Inject 9 Units into the skin at bedtime.)     insulin lispro (HUMALOG) 100 UNIT/ML injection Inject 0-8 Units into the skin 3 (three) times daily before meals. 70-178= 0 units, 180-200= 1 unit, 201-250= 3 units, 251-300= 5 units, 301-350= 6 units, 351-400= 8 units     memantine (NAMENDA) 5 MG tablet Take 5 mg by mouth daily.     metFORMIN (GLUCOPHAGE) 500 MG tablet Take 500 mg by mouth 2 (two) times daily.     metoprolol succinate (TOPROL-XL) 50 MG 24 hr tablet Take 50 mg by mouth daily.     nitroGLYCERIN (NITROSTAT) 0.4 MG SL tablet Place 0.4 mg under the  tongue every 5 (five) minutes as needed for chest pain.     Omega-3 Fatty Acids (ULTRA OMEGA 3 PO) Take 2 capsules by mouth every evening.     pantoprazole (PROTONIX) 20 MG tablet Take 20 mg by mouth daily.     Probiotic Product (PROBIOTIC PO) Take 1 capsule by mouth daily. Saccharomyces boulardii     QC CALCIUM 500MG -D3 500-200 MG-UNIT TABS Take 1 tablet by mouth daily.     sitaGLIPtin (JANUVIA) 25 MG tablet Take 25 mg by mouth daily.     vitamin B-12 (CYANOCOBALAMIN) 500 MCG tablet Take 500 mcg by mouth daily.     ZINC GLYCINATE PO Take 30 mg by mouth daily.       Scheduled Meds:  sodium chloride   Intravenous Once   acetaminophen  1,000 mg Oral Q8H   amLODipine  10 mg Oral Daily   Chlorhexidine Gluconate Cloth  6 each Topical Daily   escitalopram  5 mg Oral Daily   fenofibrate  160 mg Oral Daily   insulin aspart  0-15 Units Subcutaneous TID WC   insulin glargine-yfgn  8 Units Subcutaneous Daily   lidocaine  1 patch Transdermal Q24H   linagliptin  5 mg Oral Daily   memantine  5 mg Oral Daily   metoprolol succinate  50 mg Oral Daily   pantoprazole  20 mg Oral Daily   polyethylene glycol  17 g Oral Daily   sodium chloride flush  3 mL Intravenous Q12H   Continuous Infusions:  sodium chloride Stopped (02/02/23 1806)   PRN Meds:.HYDROmorphone (DILAUDID) injection, menthol-cetylpyridinium **OR** phenol, nitroGLYCERIN, ondansetron **OR** ondansetron (ZOFRAN) IV, mouth rinse, oxyCODONE, oxyCODONE, sodium chloride flush  Allergies:  Allergies  Allergen Reactions   Baclofen Other (See Comments)    Severe AMS, comatose, intubated   Bee Venom Anaphylaxis   Contrast Media [Iodinated Contrast Media] Anaphylaxis   Iodine-131 Anaphylaxis   Metrizamide Anaphylaxis   Metronidazole Anaphylaxis   Atorvastatin Other (See Comments)    Aching in legs   Lisinopril Cough    Family History  Problem Relation Age of Onset   Hypertension Other     Social History:  reports that he has never  smoked. He has never used smokeless tobacco. He reports that he does not drink alcohol. No history on file for drug use.  ROS: A complete review of systems was performed.  All systems are negative except for pertinent findings as noted.  Physical Exam:  Vital signs in last 24 hours: Temp:  [97.7 F (36.5 C)-98.6 F (37 C)] 98 F (36.7 C) (03/22 1600) Pulse Rate:  [61-90] 61 (03/22 1600) Resp:  [10-19] 10 (03/22 1600) BP: (127-150)/(56-71) 127/59 (03/22 1600) SpO2:  [92 %-96 %] 94 % (03/22 1600) Constitutional:  Alert and  oriented, No acute distress Cardiovascular: Regular rate and rhythm Respiratory: Normal respiratory effort, Lungs clear bilaterally GI: Abdomen is soft, nontender, nondistended, no abdominal masses GU: No CVA tenderness; on exam, three-piece penile prosthesis in place with no evidence of infection with no tenderness, no erosion, no skin changes, no edema, no erythema; prosthesis was successfully deflated without difficulty. Neurologic: Grossly intact, no focal deficits Psychiatric: Normal mood and affect  Laboratory Data:  Recent Labs    02/02/23 0446 02/03/23 0241  WBC 12.9* 16.5*  HGB 8.5* 7.6*  HCT 26.6* 24.3*  PLT 212 220    Recent Labs    02/02/23 0446 02/03/23 0241 02/03/23 1432 02/04/23 0359  NA 132* 127* 131* 129*  K 4.0 4.0  --  4.4  CL 100 98  --  101  GLUCOSE 258* 193*  --  159*  BUN 25* 26*  --  30*  CALCIUM 8.3* 7.5*  --  7.6*  CREATININE 2.04* 2.18*  --  2.06*     Results for orders placed or performed during the hospital encounter of 02/01/23 (from the past 24 hour(s))  Glucose, capillary     Status: Abnormal   Collection Time: 02/03/23  9:25 PM  Result Value Ref Range   Glucose-Capillary 128 (H) 70 - 99 mg/dL  Comprehensive metabolic panel     Status: Abnormal   Collection Time: 02/04/23  3:59 AM  Result Value Ref Range   Sodium 129 (L) 135 - 145 mmol/L   Potassium 4.4 3.5 - 5.1 mmol/L   Chloride 101 98 - 111 mmol/L   CO2  18 (L) 22 - 32 mmol/L   Glucose, Bld 159 (H) 70 - 99 mg/dL   BUN 30 (H) 8 - 23 mg/dL   Creatinine, Ser 2.06 (H) 0.61 - 1.24 mg/dL   Calcium 7.6 (L) 8.9 - 10.3 mg/dL   Total Protein 5.6 (L) 6.5 - 8.1 g/dL   Albumin 2.6 (L) 3.5 - 5.0 g/dL   AST 40 15 - 41 U/L   ALT 11 0 - 44 U/L   Alkaline Phosphatase 49 38 - 126 U/L   Total Bilirubin 0.7 0.3 - 1.2 mg/dL   GFR, Estimated 31 (L) >60 mL/min   Anion gap 10 5 - 15  Glucose, capillary     Status: Abnormal   Collection Time: 02/04/23  7:37 AM  Result Value Ref Range   Glucose-Capillary 183 (H) 70 - 99 mg/dL  Glucose, capillary     Status: Abnormal   Collection Time: 02/04/23 11:28 AM  Result Value Ref Range   Glucose-Capillary 205 (H) 70 - 99 mg/dL  Glucose, capillary     Status: Abnormal   Collection Time: 02/04/23  4:18 PM  Result Value Ref Range   Glucose-Capillary 170 (H) 70 - 99 mg/dL   Recent Results (from the past 240 hour(s))  Surgical PCR screen     Status: None   Collection Time: 02/02/23  3:57 AM   Specimen: Nasal Mucosa; Nasal Swab  Result Value Ref Range Status   MRSA, PCR NEGATIVE NEGATIVE Final   Staphylococcus aureus NEGATIVE NEGATIVE Final    Comment: (NOTE) The Xpert SA Assay (FDA approved for NASAL specimens in patients 11 years of age and older), is one component of a comprehensive surveillance program. It is not intended to diagnose infection nor to guide or monitor treatment. Performed at [facility], [place] [address] [place], [place] [address]     Renal Function: Recent Labs    02/01/23  1316 02/02/23 0446 02/03/23 0241 02/04/23 0359  CREATININE 2.16* 2.04* 2.18* 2.06*   Estimated Creatinine Clearance: 30.2 mL/min (A) (by C-G formula based on SCr of 2.06 mg/dL (H)).  Radiologic Imaging: HYBRID OR IMAGING ([facility] ONLY)  Result Date: 02/03/2023 There is no interpretation for this exam.  This order is for images obtained during a surgical procedure.  Please See "Surgeries" Tab for more information  regarding the procedure.   US RENAL  Result Date: 02/02/2023 CLINICAL DATA:  Acute kidney injury. EXAM: RENAL / URINARY TRACT ULTRASOUND COMPLETE COMPARISON:  None Available. FINDINGS: Right Kidney: Renal measurements: 11.3 cm x 5.2 cm x 4.6 cm = volume: 140.01 mL. Diffusely increased echogenicity of the renal parenchyma is noted. No mass or hydronephrosis visualized. Left Kidney: Renal measurements: 10.5 cm x 5.5 cm x 4.4 cm = volume: 132.92 mL. Diffusely increased echogenicity of the renal parenchyma is noted. No mass or hydronephrosis visualized. Bladder: A Foley catheter is present within the urinary bladder. Other: None. IMPRESSION: Bilateral echogenic kidneys which may represent sequelae associated with medical renal disease. Electronically Signed   By: Virgina Norfolk M.D.   On: 02/02/2023 20:15    I independently reviewed the above imaging studies.  Impression/Recommendation Erectile dysfunction s/p penile prosthesis with inflated prosthesis Urinary retention  -With his nurse at bedside, I deflated his prosthesis however this was poorly tolerated as he was confused.  Prosthesis is now deflated and patient resting comfortably. -Foley catheter to gravity drainage.  Void trial per primary team.  Discussed with primary team that if you were to require chronic catheter, would recommend suprapubic tube to avoid risk of erosion of his penile prosthesis. -Following peripherally.  Please call with questions.  Matt R. Trevaughn Schear MD 02/04/2023, [facility]  Pager: [phone]

## 2023-02-04 NOTE — Progress Notes (Signed)
OT Cancellation Note  Patient Details Name: Duane [name] MRN: [id number] DOB: 09/10/1935   Cancelled Treatment:    Reason Eval/Treat Not Completed: Fatigue/lethargy limiting ability to participate (Pt just finished with PT and nursing provided pain medications. Pt unable to participate in OT session this afternoon. Will re-attempt another day.)  Ailene Ravel, OTR/L,CBIS  Supplemental OT - [facility] and [facility] Secure Chat Preferred   02/04/2023, 4:03 PM

## 2023-02-04 NOTE — Care Management Important Message (Signed)
Important Message  Patient Details  Name: Duane [name] MRN: [id number] Date of Birth: 13-Nov-1935   Medicare Important Message Given:  Yes     [name] 02/04/2023, 1:45 PM

## 2023-02-04 NOTE — Progress Notes (Signed)
Neurosurgery Service Progress Note  Subjective: No acute events overnight, stable confusion and neck pain today, neurologically no complaints  Objective: Vitals:   02/03/23 2306 02/03/23 2326 02/04/23 0257 02/04/23 0726  BP:  128/61  (!) 150/71  Pulse:  90  70  Resp:  19 14 13   Temp:  98.6 F (37 C) 98.4 F (36.9 C) 97.7 F (36.5 C)  TempSrc:  Oral Oral Oral  SpO2: 93% 92%  94%  Weight:      Height:        Physical Exam: Ox1, strength 4/5 in BUE, 4+/5 in BLE, incision c/d/I, drain w/ serosang output  Assessment & Plan: 88 y.o. man s/p fracture stabilization, evac of epidural hematoma, recovering well.  -d/c drain -hold plavix until POD10 -hold eliquis for now - can discuss risks / benefits at 2 week follow up -medicine recs  Judith Part  02/04/23 8:55 AM

## 2023-02-04 NOTE — Progress Notes (Signed)
Orthopedic Tech Progress Note Patient Details:  Duane [name] 07/29/1935 [id number]  Ortho Devices Type of Ortho Device: Soft collar Ortho Device/Splint Interventions: Ordered      [place] 02/04/2023, 1:43 PM

## 2023-02-04 NOTE — Progress Notes (Signed)
Progress Note   Patient: Duane [name] [id number] DOB: 07/24/1935 DOA: 02/01/2023     3 DOS: the patient was seen and examined on 02/04/2023   Brief hospital course: 88 y.o. male with past medical history of PAF on Eliquis, CAD s/p. 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  Patient was reportedly sent to the ED from his chiropractor's office (son is a Restaurant manager, fast food) after he arrived here from his ALF with blood around his mouth, abrasions on his bilateral forearms, and was complaining of severe pain in his upper back/shoulders.  Son suspects that patient may have fallen off his electric wheelchair while being transported from ALF to his chiropractor/son's office.  ECT imaging showed acute nondisplaced fracture involving C6 lamina with medial lateral mass with extension.  While waiting in the emergency room-patient had a significant decline in neurological exam, emergent MRI showed epidural hemorrhage from C1 to at least T2, and known posterior element fractures at C6 and C7.  Patient then underwent emergent C5-C7 laminectomy and evacuation of epidural hematoma on 3/19.  Preoperatively he was given a dose of Kcentra to reverse Eliquis, and a unit of platelets (patient on Plavix).  Postoperatively-patient was noted to have AKI, hyponatremia-hospitalist service was asked to evaluate this patient for further recommendations.   Baseline-patient lives at ALF-and is able to walk short distances.  He has significant peripheral neuropathy secondary diabetes-and for longer distances-he uses his electric wheelchair.  Patient is otherwise independent in other activities of daily living. No known history of nausea, vomiting, diarrhea, abdominal pain, dysuria, hematuria.  Assessment and Plan: Cervical spine fracture with epidural hematoma-s/p C5-C7  laminectomy, C5-T1 posterior instrumented fusion 3/19 Cont to avoid Toradol due to AKI Cont Tylenol to scheduled dosing Minimize narcotics if at all possible-will place on scheduled MiraLAX/senna for bowel regimen Therapy recs for SNF noted. TOC following Primary service following   AKI on CKD stage IIIb Likely hemodynamically mediated-baseline creatinine 1.6-1.8 range Stopped Toradol Avoid nephrotoxic agents Recently noted to be retaining urine, with over 800cc out yesterday afternoon Staff reports pt still needing I/o cath If still needing I/O cath >24hrs total, would consider indwelling foley and f/u with Urology Renal function improving since doing I/o caths   Hyponatremia Family reported patient has had severe hyponatremia in the past-due to excessive water intake. -Staff reports pt drinking ample amounts of water while here -Appears euvolemic on exam -sodium improved with 1200cc fluid restriction -Recheck bmet in AM   PAF Sinus rhythm Continue metoprolol Continue to hold Eliquis-only will be resumed once cleared by neurosurgery given epidural hematoma.   CAD s/p PCI to RCA 2020 No anginal symptoms Prior to admit-on Plavix-this obviously has been on hold due to epidural hematoma-will only be resumed once cleared by neurosurgery   History of CVA 2022 Per son-had right-sided deficits that have essentially resolved with therapy. Plavix/Eliquis on hold per above -Per Neurosurgery, can discuss risk/benefits to anticoag/antiplatelet at 2 weeks f/u   HTN BP stable at present Continue amlodipine/metoprolol   HLD Intolerant to statin   DM-2 with uncontrolled hyperglycemia Cont Semglee-8 units Continue SSI/Tradjenta A1c 7.5      Subjective: Complaining of neck pain this AM. Seen with Neurosurg at bedside  Physical Exam: Vitals:   02/03/23 2326 02/04/23 0257 02/04/23 0726 02/04/23 1130  BP: 128/61  (!) 150/71 (!) 133/56  Pulse: 90  70  70  Resp: 19 14 13 10   Temp:  98.6 F (37 C) 98.4 F (36.9 C) 97.7 F (36.5 C) 98.4 F (36.9 C)  TempSrc: Oral Oral Oral Oral  SpO2: 92%  94% 96%  Weight:      Height:       General exam: Conversant, in no acute distress Respiratory system: normal chest rise, clear, no audible wheezing Cardiovascular system: regular rhythm, s1-s2 Gastrointestinal system: Nondistended, nontender, pos BS Central nervous system: No seizures, no tremors Extremities: No cyanosis, no joint deformities Skin: No rashes, no pallor Psychiatry: Affect normal // no auditory hallucinations   Data Reviewed:  Labs reviewed: Na 129, K 4.4, Cr 2.06   Family Communication: Pt in room, family not at bedside  Disposition: Status is: Inpatient Remains inpatient appropriate because: Severity of illness  Planned Discharge Destination: Skilled nursing facility    Author: Marylu Lund, MD 02/04/2023 2:42 PM  For on call review [email or web address].

## 2023-02-04 NOTE — Progress Notes (Signed)
Per MD, plan for possible dc to SNF over weekend. CMA to start [place]. Notified Dru in [facility] admissions of possible weekend dc. Will provide updates as available.   Wandra Feinstein, MSW, LCSW [phone] (coverage)

## 2023-02-05 DIAGNOSIS — S12601A Unspecified nondisplaced fracture of seventh cervical vertebra, initial encounter for closed fracture: Secondary | ICD-10-CM | POA: Diagnosis not present

## 2023-02-05 DIAGNOSIS — Z9889 Other specified postprocedural states: Secondary | ICD-10-CM | POA: Diagnosis not present

## 2023-02-05 DIAGNOSIS — S12501A Unspecified nondisplaced fracture of sixth cervical vertebra, initial encounter for closed fracture: Secondary | ICD-10-CM | POA: Diagnosis not present

## 2023-02-05 LAB — COMPREHENSIVE METABOLIC PANEL
ALT: 10 U/L (ref 0–44)
AST: 35 U/L (ref 15–41)
Albumin: 2.8 g/dL — ABNORMAL LOW (ref 3.5–5.0)
Alkaline Phosphatase: 71 U/L (ref 38–126)
Anion gap: 14 (ref 5–15)
BUN: 28 mg/dL — ABNORMAL HIGH (ref 8–23)
CO2: 21 mmol/L — ABNORMAL LOW (ref 22–32)
Calcium: 8.6 mg/dL — ABNORMAL LOW (ref 8.9–10.3)
Chloride: 95 mmol/L — ABNORMAL LOW (ref 98–111)
Creatinine, Ser: 1.78 mg/dL — ABNORMAL HIGH (ref 0.61–1.24)
GFR, Estimated: 37 mL/min — ABNORMAL LOW (ref >60)
Glucose, Bld: 174 mg/dL — ABNORMAL HIGH (ref 70–99)
Potassium: 4.1 mmol/L (ref 3.5–5.1)
Sodium: 130 mmol/L — ABNORMAL LOW (ref 135–145)
Total Bilirubin: 0.8 mg/dL (ref 0.3–1.2)
Total Protein: 6.6 g/dL (ref 6.5–8.1)

## 2023-02-05 LAB — CBC
HCT: 27 % — ABNORMAL LOW (ref 39.0–52.0)
Hemoglobin: 8.4 g/dL — ABNORMAL LOW (ref 13.0–17.0)
MCH: 25 pg — ABNORMAL LOW (ref 26.0–34.0)
MCHC: 31.1 g/dL (ref 30.0–36.0)
MCV: 80.4 fL (ref 80.0–100.0)
Platelets: 238 10*3/uL (ref 150–400)
RBC: 3.36 MIL/uL — ABNORMAL LOW (ref 4.22–5.81)
RDW: 15.8 % — ABNORMAL HIGH (ref 11.5–15.5)
WBC: 11.2 10*3/uL — ABNORMAL HIGH (ref 4.0–10.5)
nRBC: 0 % (ref 0.0–0.2)

## 2023-02-05 LAB — GLUCOSE, CAPILLARY
Glucose-Capillary: 172 mg/dL — ABNORMAL HIGH (ref 70–99)
Glucose-Capillary: 135 mg/dL — ABNORMAL HIGH (ref 70–99)
Glucose-Capillary: 159 mg/dL — ABNORMAL HIGH (ref 70–99)
Glucose-Capillary: 145 mg/dL — ABNORMAL HIGH (ref 70–99)

## 2023-02-05 NOTE — Progress Notes (Signed)
Physical Therapy Treatment Patient Details Name: JIAAN GAEDE MRN: [id number] DOB: 30-Jul-1935 Today's Date: 02/05/2023   History of Present Illness 88 y.o. male admitted 02/01/23 after suspected fall and found to have bilateral shoulder pain, and blood in his mouth.  Patient found to have epidural hematoma and cervical spinal ligamentous injury and change in alignment.   He underwent C5-C7 laminectomies for evacuation of epidural hematoma and C5-T1 posterior instrumented fusion.  PMH positive for a-fib, prior CVA, arthritis, CAD, DM, Eczema, HTN, HLD, CKD III, sleep apnea, MI, prostatic hypertrophy and MI.    PT Comments    Pt received in supine with son present. Pt verbalizes wanting to mobilize but is fearful of pain. Stedy used to work on sit>stand as well as standing tolerance. Max A +2 needed for bed mobility and sit>stand. Pt transferred to [facility] and attempted to have BM but was unsuccessful. Pt with increased pain in sitting, tolerated better when head supported by therapist than with use of soft collar which he felt made him more uncomfortable. VSS during session. PT will continue to follow.    Recommendations for follow up therapy are one component of a multi-disciplinary discharge planning process, led by the attending physician.  Recommendations may be updated based on patient status, additional functional criteria and insurance authorization.  Follow Up Recommendations  Skilled nursing-short term rehab (<3 hours/day) Can patient physically be transported by private vehicle: No   Assistance Recommended at Discharge Frequent or constant Supervision/Assistance  Patient can return home with the following Two people to help with walking and/or transfers;Assistance with cooking/housework;Assist for transportation;Two people to help with bathing/dressing/bathroom   Equipment Recommendations  None recommended by PT    Recommendations for Other Services       Precautions / Restrictions  Precautions Precautions: Fall Precaution Comments: no brace needed per order, but MD approved soft collar for comfort Required Braces or Orthoses: Cervical Brace Cervical Brace: For comfort;Soft collar Restrictions Weight Bearing Restrictions: No     Mobility  Bed Mobility Overal bed mobility: Needs Assistance Bed Mobility: Sit to Supine, Supine to Sit Rolling: Mod assist Sidelying to sit: Max assist   Sit to supine: Max assist, +2 for physical assistance   General bed mobility comments: vc's for keeping precautions, max A at lower body and upper body. Pt assisted minimally with R hand on L rail    Transfers Overall transfer level: Needs assistance Equipment used: Ambulation equipment used Transfers: Sit to/from Stand, Bed to chair/wheelchair/BSC Sit to Stand: Max assist, +2 physical assistance, From elevated surface           General transfer comment: stedy used for transfer from bed to [facility] and BSC back to bed. Max A +2 for power up and fwd translation of pelvis. Pt had more difficulty with stand from [facility] when fatigued. Transfer via Lift Equipment: Stedy  Ambulation/Gait             Pre-gait activities: worked on standing with erect posture. General Gait Details: unable to step feet in standing   Stairs             Wheelchair Mobility    Modified Rankin (Stroke Patients Only)       Balance Overall balance assessment: Needs assistance Sitting-balance support: Feet supported Sitting balance-Leahy Scale: Poor Sitting balance - Comments: pt with posterior lean. Increased pain in sitting but was relieved for short bouts when therapist supported pt's head from forehead Postural control: Posterior lean Standing balance support: Bilateral upper extremity  supported Standing balance-Leahy Scale: Zero Standing balance comment: needed max A +2 to fully stand upright                            Cognition Arousal/Alertness: Awake/alert Behavior  During Therapy: Anxious Overall Cognitive Status: History of cognitive impairments - at baseline                                 General Comments: history of dementia        Exercises General Exercises - Lower Extremity Ankle Circles/Pumps: Both, 10 reps, AROM    General Comments General comments (skin integrity, edema, etc.): SPO2 upper 90's on RA, HR stable low 100s      Pertinent Vitals/Pain Pain Assessment Pain Assessment: 0-10 Pain Score: 10-Worst pain ever Pain Location: neck, shoulders Pain Descriptors / Indicators: Aching, Discomfort, Guarding, Grimacing, Operative site guarding, Moaning Pain Intervention(s): Limited activity within patient's tolerance, Monitored during session, Premedicated before session    Home Living                          Prior Function            PT Goals (current goals can now be found in the care plan section) Acute Rehab PT Goals Patient Stated Goal: improve pain PT Goal Formulation: With patient/family Time For Goal Achievement: 02/16/23 Potential to Achieve Goals: Fair Progress towards PT goals: Progressing toward goals    Frequency    Min 3X/week      PT Plan Current plan remains appropriate    Co-evaluation              AM-PAC PT "6 Clicks" Mobility   Outcome Measure  Help needed turning from your back to your side while in a flat bed without using bedrails?: A Little Help needed moving from lying on your back to sitting on the side of a flat bed without using bedrails?: Total Help needed moving to and from a bed to a chair (including a wheelchair)?: Total Help needed standing up from a chair using your arms (e.g., wheelchair or bedside chair)?: Total Help needed to walk in hospital room?: Total Help needed climbing 3-5 steps with a railing? : Total 6 Click Score: 8    End of Session Equipment Utilized During Treatment: Gait belt;Cervical collar (began with c collar but was causing  more pain so was removed) Activity Tolerance: Patient limited by pain Patient left: in bed;with call bell/phone within reach;with family/visitor present;with bed alarm set Nurse Communication: Mobility status PT Visit Diagnosis: Other abnormalities of gait and mobility (R26.89);Muscle weakness (generalized) (M62.81);Pain Pain - Right/Left:  (both) Pain - part of body:  (neck)     Time: [id number] PT Time Calculation (min) (ACUTE ONLY): 36 min  Charges:  $Gait Training: 8-22 mins $Therapeutic Activity: 8-22 mins                     Leighton Roach, PT  Acute Rehab Services Secure chat preferred Office [place] 02/05/2023, 4:35 PM

## 2023-02-05 NOTE — Progress Notes (Signed)
Neurosurgery Service Progress Note   Subjective: No acute events overnight, stable confusion and neck pain today, neurologically no complaints.    Objective: BP 139/70 (BP Location: Left Arm)   Pulse 96   Temp (!) 97.4 F (36.3 C) (Oral)   Resp 16   Ht 5\' 11"  (1.803 m)   Wt 94.5 kg   SpO2 95%   BMI 29.06 kg/m    Physical Exam: Ox1 which is patient's baseline per son Strength 4/5 in BUE 4+/5 in BLE  incision c/d/I   Assessment & Plan: 88 y.o. man s/p fracture stabilization, evac of epidural hematoma on 02/01/23, recovering well.   -hold plavix until POD10 -hold eliquis for now - can discuss risks / benefits at 2 week follow up -medicine recs  -continue PT/OT as tolerated  Caroline More, [facility]

## 2023-02-05 NOTE — Progress Notes (Signed)
Progress Note   Patient: Duane [name] [id number] DOB: 1935-04-18 DOA: 02/01/2023     4 DOS: the patient was seen and examined on 02/05/2023   Brief hospital course: 88 y.o. male with past medical history of PAF on Eliquis, CAD s/p. 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  Patient was reportedly sent to the ED from his chiropractor's office (son is a Restaurant manager, fast food) after he arrived here from his ALF with blood around his mouth, abrasions on his bilateral forearms, and was complaining of severe pain in his upper back/shoulders.  Son suspects that patient may have fallen off his electric wheelchair while being transported from ALF to his chiropractor/son's office.  ECT imaging showed acute nondisplaced fracture involving C6 lamina with medial lateral mass with extension.  While waiting in the emergency room-patient had a significant decline in neurological exam, emergent MRI showed epidural hemorrhage from C1 to at least T2, and known posterior element fractures at C6 and C7.  Patient then underwent emergent C5-C7 laminectomy and evacuation of epidural hematoma on 3/19.  Preoperatively he was given a dose of Kcentra to reverse Eliquis, and a unit of platelets (patient on Plavix).  Postoperatively-patient was noted to have AKI, hyponatremia-hospitalist service was asked to evaluate this patient for further recommendations.   Baseline-patient lives at ALF-and is able to walk short distances.  He has significant peripheral neuropathy secondary diabetes-and for longer distances-he uses his electric wheelchair.  Patient is otherwise independent in other activities of daily living. No known history of nausea, vomiting, diarrhea, abdominal pain, dysuria, hematuria.  Assessment and Plan: Cervical spine fracture with epidural hematoma-s/p C5-C7  laminectomy, C5-T1 posterior instrumented fusion 3/19 Cont to avoid Toradol due to AKI Cont Tylenol to scheduled dosing Minimize narcotics if at all possible-will place on scheduled MiraLAX/senna for bowel regimen Therapy recs for SNF noted. TOC following Primary service following   AKI on CKD stage IIIb Likely hemodynamically mediated-baseline creatinine 1.6-1.8 range Stopped Toradol Avoid nephrotoxic agents Now with indwelling cath secondary to bladder outlet obstruction Per Urology, if needing long-term cath, would do suprapubic cath secondary to penile implant Will attempt voiding trial   Hyponatremia Family reported patient has had severe hyponatremia in the past-due to excessive water intake. -Staff reports pt drinking ample amounts of water while here -Appears euvolemic on exam -sodium improving with 1200cc fluid restriction -Recheck bmet in AM   PAF Sinus rhythm Continue metoprolol Continue to hold Eliquis-only will be resumed once cleared by neurosurgery given epidural hematoma.   CAD s/p PCI to RCA 2020 No anginal symptoms Prior to admit-on Plavix-this obviously has been on hold due to epidural hematoma-will only be resumed once cleared by neurosurgery   History of CVA 2022 Per son-had right-sided deficits that have essentially resolved with therapy. Plavix/Eliquis on hold per above -Per Neurosurgery, can discuss risk/benefits to anticoag/antiplatelet at 2 weeks f/u   HTN BP stable at present Continue amlodipine/metoprolol   HLD Intolerant to statin   DM-2 with uncontrolled hyperglycemia Cont Semglee-8 units Continue SSI/Tradjenta A1c 7.5     Subjective: Hoping to have foley cath removed soon  Physical Exam: Vitals:   02/04/23 2331 02/05/23 0328 02/05/23 0844 02/05/23 1155  BP: (!) 141/52 (!) 135/59 139/70 (!) 147/62  Pulse: 89 97 96 80  Resp: (!) 9 11 16 18   Temp: 97.7 F (36.5 C) 97.6 F (36.4 C) (!)  97.4 F (36.3 C) 97.7 F (36.5 C)   TempSrc: Oral Oral Oral Oral  SpO2: 96% 96% 95% 98%  Weight:      Height:       General exam: Awake, laying in bed, in nad Respiratory system: Normal respiratory effort, no wheezing Cardiovascular system: regular rate, s1, s2 Gastrointestinal system: Soft, nondistended, positive BS Central nervous system: CN2-12 grossly intact, strength intact Extremities: Perfused, no clubbing Skin: Normal skin turgor, no notable skin lesions seen Psychiatry: Mood normal // no visual hallucinations   Data Reviewed:  Labs reviewed: Na 130, K 4.1, Cr 1.78, Hgb 8.4   Family Communication: Pt in room, family not at bedside  Disposition: Status is: Inpatient Remains inpatient appropriate because: Severity of illness  Planned Discharge Destination: Skilled nursing facility    Author: Marylu Lund, MD 02/05/2023 4:39 PM  For on call review [email or web address].

## 2023-02-05 NOTE — Progress Notes (Signed)
[place] auth for [facility] remains pending at this time, additional clinicals sent at their request. SW will provide updates as available.   Wandra Feinstein, MSW, LCSW [phone] (coverage)

## 2023-02-06 DIAGNOSIS — S12501A Unspecified nondisplaced fracture of sixth cervical vertebra, initial encounter for closed fracture: Secondary | ICD-10-CM | POA: Diagnosis not present

## 2023-02-06 DIAGNOSIS — S064XAA Epidural hemorrhage with loss of consciousness status unknown, initial encounter: Secondary | ICD-10-CM | POA: Diagnosis not present

## 2023-02-06 DIAGNOSIS — S12601A Unspecified nondisplaced fracture of seventh cervical vertebra, initial encounter for closed fracture: Secondary | ICD-10-CM | POA: Diagnosis not present

## 2023-02-06 DIAGNOSIS — Z9889 Other specified postprocedural states: Secondary | ICD-10-CM | POA: Diagnosis not present

## 2023-02-06 LAB — COMPREHENSIVE METABOLIC PANEL
ALT: 12 U/L (ref 0–44)
AST: 27 U/L (ref 15–41)
Albumin: 2.6 g/dL — ABNORMAL LOW (ref 3.5–5.0)
Alkaline Phosphatase: 75 U/L (ref 38–126)
Anion gap: 11 (ref 5–15)
BUN: 23 mg/dL (ref 8–23)
CO2: 21 mmol/L — ABNORMAL LOW (ref 22–32)
Calcium: 8.5 mg/dL — ABNORMAL LOW (ref 8.9–10.3)
Chloride: 96 mmol/L — ABNORMAL LOW (ref 98–111)
Creatinine, Ser: 1.6 mg/dL — ABNORMAL HIGH (ref 0.61–1.24)
GFR, Estimated: 42 mL/min — ABNORMAL LOW (ref >60)
Glucose, Bld: 181 mg/dL — ABNORMAL HIGH (ref 70–99)
Potassium: 4 mmol/L (ref 3.5–5.1)
Sodium: 128 mmol/L — ABNORMAL LOW (ref 135–145)
Total Bilirubin: 0.7 mg/dL (ref 0.3–1.2)
Total Protein: 6.3 g/dL — ABNORMAL LOW (ref 6.5–8.1)

## 2023-02-06 LAB — CBC
HCT: 28.5 % — ABNORMAL LOW (ref 39.0–52.0)
Hemoglobin: 8.7 g/dL — ABNORMAL LOW (ref 13.0–17.0)
MCH: 25.4 pg — ABNORMAL LOW (ref 26.0–34.0)
MCHC: 30.5 g/dL (ref 30.0–36.0)
MCV: 83.3 fL (ref 80.0–100.0)
Platelets: 262 10*3/uL (ref 150–400)
RBC: 3.42 MIL/uL — ABNORMAL LOW (ref 4.22–5.81)
RDW: 15.9 % — ABNORMAL HIGH (ref 11.5–15.5)
WBC: 8 10*3/uL (ref 4.0–10.5)
nRBC: 0 % (ref 0.0–0.2)

## 2023-02-06 LAB — GLUCOSE, CAPILLARY
Glucose-Capillary: 162 mg/dL — ABNORMAL HIGH (ref 70–99)
Glucose-Capillary: 172 mg/dL — ABNORMAL HIGH (ref 70–99)
Glucose-Capillary: 153 mg/dL — ABNORMAL HIGH (ref 70–99)
Glucose-Capillary: 130 mg/dL — ABNORMAL HIGH (ref 70–99)
Glucose-Capillary: 135 mg/dL — ABNORMAL HIGH (ref 70–99)

## 2023-02-06 MED ORDER — CHLORHEXIDINE GLUCONATE CLOTH 2 % EX PADS
6.0000 | MEDICATED_PAD | Freq: Every day | CUTANEOUS | Status: DC
  Administered 2023-02-06 – 2023-02-10 (×5): 6 via TOPICAL

## 2023-02-06 NOTE — Progress Notes (Signed)
Daughter [name] [phone]) informed this nurse that she was concerned this patient potentially being d/c'ed tomorrow to [facility] is too early. She is unsure if he is medically stable due pain tolerance still being managed with IV medications, and also has concerns about leaving indwelling foley in place after he discharges. She feels like [place] transportation services is the reason why he got injured and needed to go to the hospital. So if this patient were to go to [facility] it would be the same transportation services that injured him taking him to his follow up appointment with urology. physician notified Donne Hazel, MD. Albina Billet.   Shelbie Proctor, RN

## 2023-02-06 NOTE — Progress Notes (Signed)
Neurosurgery Service Progress Note   Subjective: No acute events overnight, stable confusion and neck pain today, neurologically no complaints.    Objective: BP (!) 154/71 (BP Location: Left Arm)   Pulse 92   Temp 97.6 F (36.4 C) (Oral)   Resp 12   Ht 5\' 11"  (1.803 m)   Wt 94.5 kg   SpO2 96%   BMI 29.06 kg/m   Physical Exam: Ox1 which is patient's baseline per son Strength 4/5 in BUE 4+/5 in BLE  incision c/d/I   Assessment & Plan: 88 y.o. man s/p fracture stabilization, evac of epidural hematoma on 02/01/23, recovering well.   -hold plavix until POD10 -hold eliquis for now - can discuss risks / benefits at 2 week follow up -medicine recs  -continue PT/OT as tolerated   Caroline More, [facility]

## 2023-02-06 NOTE — Progress Notes (Signed)
Progress Note   Patient: Duane [name] [id number] DOB: Mar 21, 1935 DOA: 02/01/2023     5 DOS: the patient was seen and examined on 02/06/2023   Brief hospital course: 88 y.o. male with past medical history of PAF on Eliquis, CAD s/p. 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  Patient was reportedly sent to the ED from his chiropractor's office (son is a Restaurant manager, fast food) after he arrived here from his ALF with blood around his mouth, abrasions on his bilateral forearms, and was complaining of severe pain in his upper back/shoulders.  Son suspects that patient may have fallen off his electric wheelchair while being transported from ALF to his chiropractor/son's office.  ECT imaging showed acute nondisplaced fracture involving C6 lamina with medial lateral mass with extension.  While waiting in the emergency room-patient had a significant decline in neurological exam, emergent MRI showed epidural hemorrhage from C1 to at least T2, and known posterior element fractures at C6 and C7.  Patient then underwent emergent C5-C7 laminectomy and evacuation of epidural hematoma on 3/19.  Preoperatively he was given a dose of Kcentra to reverse Eliquis, and a unit of platelets (patient on Plavix).  Postoperatively-patient was noted to have AKI, hyponatremia-hospitalist service was asked to evaluate this patient for further recommendations.   Baseline-patient lives at ALF-and is able to walk short distances.  He has significant peripheral neuropathy secondary diabetes-and for longer distances-he uses his electric wheelchair.  Patient is otherwise independent in other activities of daily living. No known history of nausea, vomiting, diarrhea, abdominal pain, dysuria, hematuria.  Assessment and Plan: Cervical spine fracture with epidural hematoma-s/p C5-C7  laminectomy, C5-T1 posterior instrumented fusion 3/19 Cont to avoid Toradol due to AKI Cont Tylenol to scheduled dosing Minimize narcotics if at all possible-will place on scheduled MiraLAX/senna for bowel regimen Therapy recs for SNF noted. TOC following Primary service following   AKI on CKD stage IIIb Likely hemodynamically mediated-baseline creatinine 1.6-1.8 range Stopped Toradol Avoid nephrotoxic agents Now with indwelling cath secondary to bladder outlet obstruction -Patient failed voiding trial. Discussed with Urology. Recommendation to keep with temporary indwelling cath as pt is planned to discharge to SNF as early as tomorrow -Recommend pt to f/u with his primary Urologist after discharge   Hyponatremia Family reported patient has had severe hyponatremia in the past-due to excessive water intake. -Staff reports pt drinking ample amounts of water while here -Na remaining low. Will remove fluid restriction for now -Recheck bmet in AM   PAF Sinus rhythm Continue metoprolol Continue to hold Eliquis-only will be resumed once cleared by neurosurgery given epidural hematoma.   CAD s/p PCI to RCA 2020 No anginal symptoms Prior to admit-on Plavix-this obviously has been on hold due to epidural hematoma-will only be resumed once cleared by neurosurgery   History of CVA 2022 Per son-had right-sided deficits that have essentially resolved with therapy. Plavix/Eliquis on hold per above -Per Neurosurgery, can discuss risk/benefits to anticoag/antiplatelet at 2 weeks f/u   HTN BP stable at present Continue amlodipine/metoprolol   HLD Intolerant to statin   DM-2 with uncontrolled hyperglycemia Cont Semglee-8 units Continue SSI/Tradjenta A1c 7.5     Subjective: tired this AM, pt seen shortly after receiving narcotic  Physical Exam: Vitals:   02/05/23 1931 02/05/23 2347 02/06/23 0316 02/06/23 0947  BP:  (!) 148/70 (!) 154/71   Pulse:  87 92  Resp: 10 12 12    Temp:  97.6 F (36.4 C)  97.6 F (36.4 C) 97.6 F (36.4 C)  TempSrc: Oral  Oral Oral  SpO2:  95% 96%   Weight:      Height:       General exam: Conversant, in no acute distress Respiratory system: normal chest rise, clear, no audible wheezing Cardiovascular system: regular rhythm, s1-s2 Gastrointestinal system: Nondistended, nontender, pos BS Central nervous system: No seizures, no tremors Extremities: No cyanosis, no joint deformities Skin: No rashes, no pallor Psychiatry: Affect normal // no auditory hallucinations   Data Reviewed:  Labs reviewed: Na 128, K 4.0, Cr 1.60, Hgb 8.7   Family Communication: Pt in room, family not at bedside  Disposition: Status is: Inpatient Remains inpatient appropriate because: Severity of illness  Planned Discharge Destination: Skilled nursing facility    Author: Marylu Lund, MD 02/06/2023 3:56 PM  For on call review [email or web address].

## 2023-02-06 NOTE — Progress Notes (Addendum)
[place] auth for [facility] received: [id number], [id number], valid 3/23-3/27. Dru at [facility] updated and confirmed they are prepared to admit pt pending medical clearance, including today. MD and APP updated, plan for dc Monday.  Wandra Feinstein, MSW, LCSW [phone] (coverage)

## 2023-02-07 DIAGNOSIS — S064XAA Epidural hemorrhage with loss of consciousness status unknown, initial encounter: Secondary | ICD-10-CM | POA: Diagnosis not present

## 2023-02-07 DIAGNOSIS — Z9889 Other specified postprocedural states: Secondary | ICD-10-CM | POA: Diagnosis not present

## 2023-02-07 DIAGNOSIS — S12601A Unspecified nondisplaced fracture of seventh cervical vertebra, initial encounter for closed fracture: Secondary | ICD-10-CM | POA: Diagnosis not present

## 2023-02-07 DIAGNOSIS — S12501A Unspecified nondisplaced fracture of sixth cervical vertebra, initial encounter for closed fracture: Secondary | ICD-10-CM | POA: Diagnosis not present

## 2023-02-07 LAB — CBC
HCT: 28.1 % — ABNORMAL LOW (ref 39.0–52.0)
Hemoglobin: 9 g/dL — ABNORMAL LOW (ref 13.0–17.0)
MCH: 25.4 pg — ABNORMAL LOW (ref 26.0–34.0)
MCHC: 32 g/dL (ref 30.0–36.0)
MCV: 79.2 fL — ABNORMAL LOW (ref 80.0–100.0)
Platelets: 302 10*3/uL (ref 150–400)
RBC: 3.55 MIL/uL — ABNORMAL LOW (ref 4.22–5.81)
RDW: 15.6 % — ABNORMAL HIGH (ref 11.5–15.5)
WBC: 8.6 10*3/uL (ref 4.0–10.5)
nRBC: 0 % (ref 0.0–0.2)

## 2023-02-07 LAB — COMPREHENSIVE METABOLIC PANEL
ALT: 13 U/L (ref 0–44)
AST: 25 U/L (ref 15–41)
Albumin: 2.7 g/dL — ABNORMAL LOW (ref 3.5–5.0)
Alkaline Phosphatase: 84 U/L (ref 38–126)
Anion gap: 13 (ref 5–15)
BUN: 20 mg/dL (ref 8–23)
CO2: 21 mmol/L — ABNORMAL LOW (ref 22–32)
Calcium: 8.7 mg/dL — ABNORMAL LOW (ref 8.9–10.3)
Chloride: 95 mmol/L — ABNORMAL LOW (ref 98–111)
Creatinine, Ser: 1.5 mg/dL — ABNORMAL HIGH (ref 0.61–1.24)
GFR, Estimated: 45 mL/min — ABNORMAL LOW (ref >60)
Glucose, Bld: 174 mg/dL — ABNORMAL HIGH (ref 70–99)
Potassium: 4.3 mmol/L (ref 3.5–5.1)
Sodium: 129 mmol/L — ABNORMAL LOW (ref 135–145)
Total Bilirubin: 1 mg/dL (ref 0.3–1.2)
Total Protein: 6.4 g/dL — ABNORMAL LOW (ref 6.5–8.1)

## 2023-02-07 LAB — SODIUM, URINE, RANDOM: Sodium, Ur: 29 mmol/L

## 2023-02-07 LAB — GLUCOSE, CAPILLARY
Glucose-Capillary: 162 mg/dL — ABNORMAL HIGH (ref 70–99)
Glucose-Capillary: 183 mg/dL — ABNORMAL HIGH (ref 70–99)
Glucose-Capillary: 166 mg/dL — ABNORMAL HIGH (ref 70–99)
Glucose-Capillary: 223 mg/dL — ABNORMAL HIGH (ref 70–99)

## 2023-02-07 LAB — OSMOLALITY, URINE: Osmolality, Ur: 266 mOsm/kg — ABNORMAL LOW (ref 300–900)

## 2023-02-07 LAB — OSMOLALITY: Osmolality: 282 mOsm/kg (ref 275–295)

## 2023-02-07 MED ORDER — TAMSULOSIN HCL 0.4 MG PO CAPS
0.4000 mg | ORAL_CAPSULE | Freq: Every day | ORAL | Status: DC
  Administered 2023-02-07 – 2023-02-10 (×4): 0.4 mg via ORAL
  Filled 2023-02-07 (×4): qty 1

## 2023-02-07 MED ORDER — SODIUM CHLORIDE 1 G PO TABS
1.0000 g | ORAL_TABLET | Freq: Two times a day (BID) | ORAL | Status: DC
  Administered 2023-02-07 – 2023-02-10 (×6): 1 g via ORAL
  Filled 2023-02-07 (×6): qty 1

## 2023-02-07 MED ORDER — SODIUM CHLORIDE 0.9 % IV SOLN: INTRAVENOUS | Status: DC

## 2023-02-07 MED ORDER — MAGNESIUM CITRATE PO SOLN
1.0000 | Freq: Once | ORAL | Status: DC
  Filled 2023-02-07: qty 296

## 2023-02-07 MED ORDER — POLYETHYLENE GLYCOL 3350 17 G PO PACK
17.0000 g | PACK | Freq: Two times a day (BID) | ORAL | Status: DC
  Administered 2023-02-07 – 2023-02-09 (×5): 17 g via ORAL
  Filled 2023-02-07 (×6): qty 1

## 2023-02-07 MED ORDER — HYDROXYZINE HCL 10 MG PO TABS
10.0000 mg | ORAL_TABLET | Freq: Three times a day (TID) | ORAL | Status: DC | PRN
  Administered 2023-02-07 – 2023-02-09 (×3): 10 mg via ORAL
  Filled 2023-02-07 (×4): qty 1

## 2023-02-07 MED ORDER — BISACODYL 10 MG RE SUPP
10.0000 mg | Freq: Once | RECTAL | Status: AC
  Administered 2023-02-07: 10 mg via RECTAL
  Filled 2023-02-07: qty 1

## 2023-02-07 NOTE — Progress Notes (Signed)
Occupational Therapy Treatment Patient Details Name: Duane [name] MRN: [id number] DOB: 1935-11-12 Today's Date: 02/07/2023   History of present illness 88 y.o. male admitted 02/01/23 after suspected fall and found to have bilateral shoulder pain, and blood in his mouth.  Patient found to have epidural hematoma and cervical spinal ligamentous injury and change in alignment.   He underwent C5-C7 laminectomies for evacuation of epidural hematoma and C5-T1 posterior instrumented fusion.  PMH positive for a-fib, prior CVA, arthritis, CAD, DM, Eczema, HTN, HLD, CKD III, sleep apnea, MI, prostatic hypertrophy and MI.   OT comments  Pt still needing total +2 for bed mobility and partial sit to stand in the [place].  He was only able to achieve 75% of standing with multiple attempts secondary to increased neck pain and internal distraction.  Pt with some hallucinations as well seeing his son in the room who was not there during therapy and then also seeing speakers on the ceiling and asking if he needed to move them before we got up.  Not oriented to place, time, or situation.  Recommend continued acute care OT at this time to help progress sit to stand, standing, functional transfers and ADLs following cervical precautions.  Noted slight skin abrasion and bleeding from the right elbow when therapist went into the room.  Applied foam dressing over site and informed nursing at end of session.    Recommendations for follow up therapy are one component of a multi-disciplinary discharge planning process, led by the attending physician.  Recommendations may be updated based on patient status, additional functional criteria and insurance authorization.    Assistance Recommended at Discharge Frequent or constant Supervision/Assistance  Patient can return home with the following  Two people to help with walking and/or transfers;Two people to help with bathing/dressing/bathroom;Assist for transportation;Help with stairs  or ramp for entrance;Assistance with cooking/housework   Equipment Recommendations  Other (comment) (TBD next venue of care)       Precautions / Restrictions Precautions Precautions: Fall Precaution Comments: no brace needed per order, but MD approved soft collar for comfort Required Braces or Orthoses: Cervical Brace Cervical Brace: For comfort;Soft collar Restrictions Weight Bearing Restrictions: No       Mobility Bed Mobility Overal bed mobility: Needs Assistance Bed Mobility: Rolling, Sidelying to Sit, Sit to Sidelying Rolling: Mod assist Sidelying to sit: +2 for physical assistance, Total assist, +2 for safety/equipment   Sit to supine: Total assist, +2 for physical assistance, +2 for safety/equipment Sit to sidelying: Total assist, +2 for physical assistance General bed mobility comments: Pt needed assist for all aspects of bed mobility    Transfers Overall transfer level: Needs assistance Equipment used: Ambulation equipment used Transfers: Sit to/from Stand Sit to Stand: Max assist, +2 physical assistance, From elevated surface           General transfer comment: Pt completed multiple sit to stand transtions with use of the Steady.  Only able to achieve partial stand however and maintain less than 15 seconds. Transfer via Lift Equipment: Stedy   Balance Overall balance assessment: Needs assistance Sitting-balance support: Feet supported Sitting balance-Leahy Scale: Poor Sitting balance - Comments: Posteriorl lean and LOB in sitting   Standing balance support: Bilateral upper extremity supported Standing balance-Leahy Scale: Zero Standing balance comment: total +2 for partial standing in the [place]                           ADL either performed or  assessed with clinical judgement   ADL Overall ADL's : Needs assistance/impaired                         Toilet Transfer: Total assistance;+2 for physical assistance Toilet Transfer Details  (indicate cue type and reason): simulated with use of the [employer]- Clothing Manipulation and Hygiene: Total assistance;+2 for physical assistance;+2 for safety/equipment;Sit to/from stand Toileting - Clothing Manipulation Details (indicate cue type and reason): simulated with use of the Stedy     Functional mobility during ADLs: Total assistance;+2 for physical assistance (partial stand with Stedy) General ADL Comments: Pt currently at total +2 level for supine to sit and sit to stand several attempts from elevated EOB.  Pt only able to achieve partial standing with decreased knee, hip, and cervical extension.  Only tolerated partial stand for less than 15 seconds before having to sit.  Increased internal distraction secondary neck pain.  Utilized soft neck collar but it didn't seem to make a difference with pain.  Pt with some hallucinations during session seeing speakers on the ceiling and his son in the room.      Cognition Arousal/Alertness: Awake/alert Behavior During Therapy: Anxious (secondary to pain) Overall Cognitive Status: No family/caregiver present to determine baseline cognitive functioning                                 General Comments: history of dementia; hallucinating that son is in his room and speakers on the ceiling                   Pertinent Vitals/ Pain       Pain Assessment Pain Assessment: Faces Faces Pain Scale: Hurts whole lot Pain Location: neck, shoulders Pain Descriptors / Indicators: Aching, Discomfort, Guarding, Grimacing, Operative site guarding, Moaning, Sharp, Stabbing Pain Intervention(s): Premedicated before session, Repositioned, Monitored during session, Limited activity within patient's tolerance         Frequency  Min 2X/week        Progress Toward Goals  OT Goals(current goals can now be found in the care plan section)  Progress towards OT goals: Progressing toward goals  Acute Rehab OT Goals Patient  Stated Goal: Pt agreed to work on standing with therapy. OT Goal Formulation: With patient Time For Goal Achievement: 02/16/23 Potential to Achieve Goals: [place] Discharge plan remains appropriate    Co-evaluation    PT/OT/SLP Co-Evaluation/Treatment: Yes Reason for Co-Treatment: To address functional/ADL transfers;For patient/therapist safety PT goals addressed during session: Mobility/safety with mobility;Balance OT goals addressed during session: Strengthening/ROM;ADL's and self-care      AM-PAC OT "6 Clicks" Daily Activity     Outcome Measure   Help from another person eating meals?: Total Help from another person taking care of personal grooming?: Total Help from another person toileting, which includes using toliet, bedpan, or urinal?: Total Help from another person bathing (including washing, rinsing, drying)?: Total Help from another person to put on and taking off regular upper body clothing?: Total Help from another person to put on and taking off regular lower body clothing?: Total 6 Click Score: 6    End of Session Equipment Utilized During Treatment: Gait belt;Other (comment);Cervical collar Charlaine Dalton)  OT Visit Diagnosis: Unsteadiness on feet (R26.81);Muscle weakness (generalized) (M62.81);Pain;Other symptoms and signs involving cognitive function;Repeated falls (R29.6);History of falling (Z91.81) Pain - part of body:  (neck)   Activity Tolerance Patient limited  by pain   Patient Left in bed;with call bell/phone within reach;with bed alarm set   Nurse Communication Mobility status;Other (comment) (wound seen on right elbow, dressing applied prior to bed mobility.)        Time: 1436-1510 OT Time Calculation (min): 34 min  Charges: OT General Charges $OT Visit: 1 Visit OT Treatments $Self Care/Home Management : 8-22 mins  Clyda Greener, OTR/L [place]  Office [phone] 02/07/2023

## 2023-02-07 NOTE — Progress Notes (Signed)
Progress Note   Patient: Duane [name] [id number] DOB: [birth year] DOA: 02/01/2023     6 DOS: the patient was seen and examined on 02/07/2023   Brief hospital course: 88 y.o. male with past medical history of PAF on Eliquis, CAD s/p. 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  Patient was reportedly sent to the ED from his chiropractor's office (son is a Restaurant manager, fast food) after he arrived here from his ALF with blood around his mouth, abrasions on his bilateral forearms, and was complaining of severe pain in his upper back/shoulders.  Son suspects that patient may have fallen off his electric wheelchair while being transported from ALF to his chiropractor/son's office.  ECT imaging showed acute nondisplaced fracture involving C6 lamina with medial lateral mass with extension.  While waiting in the emergency room-patient had a significant decline in neurological exam, emergent MRI showed epidural hemorrhage from C1 to at least T2, and known posterior element fractures at C6 and C7.  Patient then underwent emergent C5-C7 laminectomy and evacuation of epidural hematoma on 3/19.  Preoperatively he was given a dose of Kcentra to reverse Eliquis, and a unit of platelets (patient on Plavix).  Postoperatively-patient was noted to have AKI, hyponatremia-hospitalist service was asked to evaluate this patient for further recommendations.   Baseline-patient lives at ALF-and is able to walk short distances.  He has significant peripheral neuropathy secondary diabetes-and for longer distances-he uses his electric wheelchair.  Patient is otherwise independent in other activities of daily living. No known history of nausea, vomiting, diarrhea, abdominal pain, dysuria, hematuria.  Assessment and Plan: Cervical spine fracture with epidural hematoma-s/p C5-C7  laminectomy, C5-T1 posterior instrumented fusion 3/19 Cont to avoid Toradol due to AKI Cont Tylenol to scheduled dosing Minimize narcotics if at all possible-will place on scheduled MiraLAX/senna for bowel regimen Therapy recs for SNF noted. TOC following Primary service following   AKI on CKD stage IIIb Likely hemodynamically mediated-baseline creatinine 1.6-1.8 range Stopped Toradol Avoid nephrotoxic agents Now with indwelling cath secondary to bladder outlet obstruction -Patient failed voiding trial. Discussed with Urology. Recommendation to keep with temporary indwelling cath as pt is planned to discharge to SNF as early as tomorrow -Recommend pt to f/u with his primary Urologist after discharge -flomax started   Hyponatremia Family reported patient has had severe hyponatremia in the past-due to excessive water intake. -Initially, staff had reported pt drinking ample amounts of water while here -Na remaining low. Repeating urine Na and urine/seum osm -very good urine output -Gentle IVfF for now as pt is not eating very well today, however may stop pending results of sodium studies   PAF Sinus rhythm Continue metoprolol Continue to hold Eliquis-only will be resumed once cleared by neurosurgery given epidural hematoma.   CAD s/p PCI to RCA 2020 No anginal symptoms Prior to admit-on Plavix-this obviously has been on hold due to epidural hematoma-will only be resumed once cleared by neurosurgery   History of CVA 2022 Per son-had right-sided deficits that have essentially resolved with therapy. Plavix/Eliquis on hold per above -Per Neurosurgery, can discuss risk/benefits to anticoag/antiplatelet at 2 weeks f/u   HTN BP stable at present Continue amlodipine/metoprolol   HLD Intolerant to statin   DM-2 with uncontrolled hyperglycemia Cont Semglee-8 units Continue SSI/Tradjenta A1c 7.5     Subjective: Complaining of some shoulder pain today  Physical Exam: Vitals:  02/06/23 2335 02/07/23 0348 02/07/23 0844 02/07/23 1114  BP: (!) 163/63 (!) 154/64 (!) 152/65 (!) 133/54  Pulse:  66 66 64  Resp: (!) 188 20 18 18   Temp:  98.6 F (37 C) 97.6 F (36.4 C) 97.7 F (36.5 C)  TempSrc:  Oral Oral Oral  SpO2:  98% 98% 99%  Weight:      Height:       General exam: Awake, laying in bed, in nad Respiratory system: Normal respiratory effort, no wheezing Cardiovascular system: regular rate, s1, s2 Gastrointestinal system: Soft, nondistended, positive BS Central nervous system: CN2-12 grossly intact, strength intact Extremities: Perfused, no clubbing Skin: Normal skin turgor, no notable skin lesions seen Psychiatry: Mood normal // no visual hallucinations   Data Reviewed:  Labs reviewed: Na 129, K 4.3, Cr 1.50, Hgb 9.0   Family Communication: Pt in room, family not at bedside  Disposition: Status is: Inpatient Remains inpatient appropriate because: Severity of illness  Planned Discharge Destination: Skilled nursing facility    Author: Marylu Lund, MD 02/07/2023 2:59 PM  For on call review [email or web address].

## 2023-02-07 NOTE — Discharge Summary (Signed)
Discharge Summary  Date of Admission: 02/01/2023  Date of Discharge: 02/10/23  Attending Physician: Emelda Brothers, MD  Hospital Course: Patient presented to the ED with unknown trauma with neck / shoulder pain and some oropharyngeal blood. Workup showed a fracture at C6-7, he had progressive neurologic decline in the setting of apixiban and clopidogrel, so a stat MRI was performed with showed a large epidural hematoma as well as suggestion of a 3 column injury. He was taken to the OR on 3/19 for laminectomies, evacuation of the epidural hematoma, and instrumentation of the fracture. Post-op, he was watched in the unit due to the combined anticoagulant / antiplatelets, but thankfully did not have recurrence and his drain was removed. Medicine was consulted to aide with AKI in the setting of CKD, hyponatremia that is suspected to be polydypsia. His hospital stay was otherwise notable for some delirium. His strength improved after evacuation of his epidural hematoma, PT/OT recommended SNF and he was discharged to SNF on 02/10/23.   Neurologic exam at discharge:  Strength 5/5 x4 and SILTx4   Discharge diagnosis: Cervical spine fracture with epidural hematoma  Judith Part, MD 02/10/23  Allergies as of 02/10/2023       Reactions   Baclofen Other (See Comments)   Severe AMS, comatose, intubated   Bee Venom Anaphylaxis   Contrast Media [iodinated Contrast Media] Anaphylaxis   Iodine-131 Anaphylaxis   Metrizamide Anaphylaxis   Metronidazole Anaphylaxis   Atorvastatin Other (See Comments)   Aching in legs   Lisinopril Cough        Medication List     STOP taking these medications    apixaban 2.5 MG Tabs tablet Commonly known as: ELIQUIS       TAKE these medications    acetaminophen 325 MG tablet Commonly known as: TYLENOL Take 2 tablets (650 mg total) by mouth every 6 (six) hours as needed for mild pain, moderate pain or headache.   amLODipine 10 MG tablet Commonly  known as: NORVASC Take 10 mg by mouth daily.   calcium carbonate 500 MG chewable tablet Commonly known as: TUMS - dosed in mg elemental calcium Chew 2 tablets by mouth every 6 (six) hours as needed for indigestion.   clopidogrel 75 MG tablet Commonly known as: PLAVIX Take 1 tablet (75 mg total) by mouth daily. Start taking on: February 11, 2023   EPINEPHrine 0.3 mg/0.3 mL Soaj injection Commonly known as: EPI-PEN Inject 0.3 mg into the muscle once as needed (severe allergic reaction).   escitalopram 5 MG tablet Commonly known as: LEXAPRO Take 5 mg by mouth daily.   fenofibrate 160 MG tablet Take 1 tablet (160 mg total) by mouth daily.   furosemide 20 MG tablet Commonly known as: LASIX Take 1 tablet (20 mg total) by mouth daily. Start taking on: February 11, 2023   insulin detemir 100 UNIT/ML injection Commonly known as: Levemir Inject 0.05 mLs (5 Units total) into the skin at bedtime. What changed: how much to take   insulin lispro 100 UNIT/ML injection Commonly known as: HUMALOG Inject 0-8 Units into the skin 3 (three) times daily before meals. 70-178= 0 units, 180-200= 1 unit, 201-250= 3 units, 251-300= 5 units, 301-350= 6 units, 351-400= 8 units   lidocaine 5 % Commonly known as: LIDODERM Place 1 patch onto the skin daily. Remove & Discard patch within 12 hours or as directed by MD Start taking on: February 11, 2023   memantine 5 MG tablet Commonly known as: NAMENDA Take 5  mg by mouth daily.   metFORMIN 500 MG tablet Commonly known as: GLUCOPHAGE Take 500 mg by mouth 2 (two) times daily.   metoprolol succinate 50 MG 24 hr tablet Commonly known as: TOPROL-XL Take 50 mg by mouth daily.   nitroGLYCERIN 0.4 MG SL tablet Commonly known as: NITROSTAT Place 0.4 mg under the tongue every 5 (five) minutes as needed for chest pain.   pantoprazole 20 MG tablet Commonly known as: PROTONIX Take 20 mg by mouth daily.   PROBIOTIC PO Take 1 capsule by mouth daily.  Saccharomyces boulardii   QC Calcium 500mg -D3 500-5 MG-MCG Tabs Generic drug: Calcium-Cholecalciferol Take 1 tablet by mouth daily.   sitaGLIPtin 25 MG tablet Commonly known as: JANUVIA Take 25 mg by mouth daily.   sodium chloride 1 g tablet Take 1 tablet (1 g total) by mouth 2 (two) times daily with a meal.   SUPER B-COMPLEX + VITAMIN C PO Take 1 tablet by mouth daily. uper b complex + c 150mg  tablet   tamsulosin 0.4 MG Caps capsule Commonly known as: FLOMAX Take 1 capsule (0.4 mg total) by mouth daily.   ULTRA OMEGA 3 PO Take 2 capsules by mouth every evening.   vitamin B-12 500 MCG tablet Commonly known as: CYANOCOBALAMIN Take 500 mcg by mouth daily.   vitamin C 1000 MG tablet Take 1,000 mg by mouth daily.   ZINC GLYCINATE PO Take 30 mg by mouth daily.               Discharge Care Instructions  (From admission, onward)           Start     Ordered   02/10/23 0000  Discharge wound care:       Comments: Discharge Instructions  No restriction in activities, slowly increase your activity back to normal.   Your incision is closed with dermabond (purple glue). This will naturally fall off over the next 1-2 weeks.   Okay to shower on the day of discharge. Use regular soap and water and try to be gentle when cleaning your incision.   Follow up with Dr. Zada Finders in 2 weeks after discharge. If you do not already have a discharge appointment, please call his office at [phone] to schedule a follow up appointment. If you have any concerns or questions, please call the office and let us know.   02/10/23 1030

## 2023-02-07 NOTE — TOC Progression Note (Addendum)
Transition of Care [facility]) - Progression Note    Patient Details  Name: Duane [name] MRN: [id number] Date of Birth: Sep 07, 1935  Transition of Care [facility]) CM/SW [place], [place] Phone Number: 02/07/2023, 2:34 PM  Clinical Narrative:     Contacted [facility]- informed of anticipation of d/c tomorrow.   Insurance authorization good from 03/23-03/27  Thurmond Butts, MSW, LCSW Clinical Social Worker    Expected Discharge Plan: Skilled Nursing Facility Barriers to Discharge: Continued Medical Work up, SNF Pending bed offer  Expected Discharge Plan and Services In-house Referral: Clinical Social Work   Post Acute Care Choice: IP Rehab, [place] Living arrangements for the past 2 months: [place]                                       Social Determinants of Health (SDOH) Interventions SDOH Screenings   Food Insecurity: No Food Insecurity (02/03/2023)  Housing: Low Risk  (02/03/2023)  Transportation Needs: No Transportation Needs (02/03/2023)  Utilities: Not At Risk (02/03/2023)  Tobacco Use: Low Risk  (02/02/2023)    Readmission Risk Interventions     No data to display

## 2023-02-07 NOTE — Progress Notes (Signed)
Neurosurgery Service Progress Note  Subjective: No acute events overnight, neck pain minimal today  Objective: Vitals:   02/06/23 2335 02/07/23 0348 02/07/23 0844 02/07/23 1114  BP: (!) 163/63 (!) 154/64 (!) 152/65 (!) 133/54  Pulse:  66 66 64  Resp: (!) 188 20 18 18   Temp:  98.6 F (37 C) 97.6 F (36.4 C) 97.7 F (36.5 C)  TempSrc:  Oral Oral Oral  SpO2:  98% 98% 99%  Weight:      Height:        Physical Exam: Ox1, strength 4 to 4+/5 in BUE, 4+/5 in BLE, incision c/d/I  Assessment & Plan: 88 y.o. man s/p fracture stabilization, evac of epidural hematoma, recovering well.  -hold plavix until POD10, hold eliquis for now - can discuss risks / benefits at 2 week follow up -medicine recs -discussed with family, they're okay with discharge back to SNF portion of [facility], will plan on discharge tomorrow if stable overnight   Judith Part  02/07/23 2:16 PM

## 2023-02-07 NOTE — Progress Notes (Signed)
Physical Therapy Treatment Patient Details Name: Duane [name] MRN: [id number] DOB: 1935/03/13 Today's Date: 02/07/2023   History of Present Illness 88 y.o. male admitted 02/01/23 after suspected fall and found to have bilateral shoulder pain, and blood in his mouth.  Patient found to have epidural hematoma and cervical spinal ligamentous injury and change in alignment.   He underwent C5-C7 laminectomies for evacuation of epidural hematoma and C5-T1 posterior instrumented fusion.  PMH positive for a-fib, prior CVA, arthritis, CAD, DM, Eczema, HTN, HLD, CKD III, sleep apnea, MI, prostatic hypertrophy and MI.    PT Comments    Patient continues to be limited by pain (despite pre-medication), fear of falling, and distracted by hallucinations. (RN made aware). Patient attempted standing with stedy x6 reps with +2 max assist. Pt only able to achieve ~3/4 standing with posterior lean (afraid of falling forwards). Patient returned to bed as unable to get flaps under his bottom to use stedy to transfer OOB.     Recommendations for follow up therapy are one component of a multi-disciplinary discharge planning process, led by the attending physician.  Recommendations may be updated based on patient status, additional functional criteria and insurance authorization.  Follow Up Recommendations  Can patient physically be transported by private vehicle: No    Assistance Recommended at Discharge Frequent or constant Supervision/Assistance  Patient can return home with the following Two people to help with walking and/or transfers;Assistance with cooking/housework;Assist for transportation;Two people to help with bathing/dressing/bathroom   Equipment Recommendations  None recommended by PT    Recommendations for Other Services       Precautions / Restrictions Precautions Precautions: Fall Precaution Comments: no brace needed per order, but MD approved soft collar for comfort Required Braces or  Orthoses: Cervical Brace Cervical Brace: For comfort;Soft collar Restrictions Weight Bearing Restrictions: No     Mobility  Bed Mobility Overal bed mobility: Needs Assistance Bed Mobility: Rolling, Sidelying to Sit, Sit to Sidelying Rolling: Mod assist Sidelying to sit: Max assist, +2 for physical assistance     Sit to sidelying: Max assist, +2 for physical assistance General bed mobility comments: vc's for keeping precautions, max A at lower body and upper body. Pt assisted minimally with R hand on L rail    Transfers Overall transfer level: Needs assistance Equipment used: Ambulation equipment used Transfers: Sit to/from Stand Sit to Stand: Max assist, +2 physical assistance, From elevated surface           General transfer comment: Attempted standing x 6 reps with rests between. Patient only comes up to 3/4 standing, holds at most 5-10 seconds and returns to sitting on EOB. Unable to get flaps down for transfer. Transfer via Lift Equipment: Stedy  Ambulation/Gait                   Stairs             Wheelchair Mobility    Modified Rankin (Stroke Patients Only)       Balance Overall balance assessment: Needs assistance Sitting-balance support: Feet supported Sitting balance-Leahy Scale: Poor Sitting balance - Comments: pt with posterior lean that improved over course of session--progressed to minguard. Increased pain in sitting but was relieved for short bouts when therapist supported pt's head from forehead Postural control: Posterior lean Standing balance support: Bilateral upper extremity supported Standing balance-Leahy Scale: Zero Standing balance comment: needed max A +2  Cognition Arousal/Alertness: Awake/alert Behavior During Therapy: Anxious Overall Cognitive Status: History of cognitive impairments - at baseline                                 General Comments: history of dementia;  hallucinating that son is in his room and speakers on the ceiling        Exercises      General Comments        Pertinent Vitals/Pain Pain Assessment Pain Assessment: Faces Faces Pain Scale: Hurts whole lot Pain Location: neck, shoulders Pain Descriptors / Indicators: Aching, Discomfort, Guarding, Grimacing, Operative site guarding, Moaning Pain Intervention(s): Limited activity within patient's tolerance, Monitored during session, Premedicated before session, Repositioned, Other (comment) (applied soft collar)    Home Living                          Prior Function            PT Goals (current goals can now be found in the care plan section) Acute Rehab PT Goals Patient Stated Goal: improve pain Time For Goal Achievement: 02/16/23 Potential to Achieve Goals: Fair Progress towards PT goals: Progressing toward goals    Frequency    Min 3X/week      PT Plan Current plan remains appropriate    Co-evaluation PT/OT/SLP Co-Evaluation/Treatment: Yes Reason for Co-Treatment: To address functional/ADL transfers;For patient/therapist safety PT goals addressed during session: Mobility/safety with mobility;Balance        AM-PAC PT "6 Clicks" Mobility   Outcome Measure  Help needed turning from your back to your side while in a flat bed without using bedrails?: A Lot Help needed moving from lying on your back to sitting on the side of a flat bed without using bedrails?: Total Help needed moving to and from a bed to a chair (including a wheelchair)?: Total Help needed standing up from a chair using your arms (e.g., wheelchair or bedside chair)?: Total Help needed to walk in hospital room?: Total Help needed climbing 3-5 steps with a railing? : Total 6 Click Score: 7    End of Session Equipment Utilized During Treatment: Cervical collar Activity Tolerance: Patient limited by pain Patient left: in bed;with call bell/phone within reach;with bed alarm  set Nurse Communication: Mobility status;Other (comment) (skin tear to rt elbow noted and dressed with foam) PT Visit Diagnosis: Other abnormalities of gait and mobility (R26.89);Muscle weakness (generalized) (M62.81);Pain Pain - Right/Left:  (both) Pain - part of body:  (neck)     Time: [id number] PT Time Calculation (min) (ACUTE ONLY): 31 min  Charges:  $Therapeutic Activity: 8-22 mins                      [place]  Office [phone]    Rexanne Mano 02/07/2023, 3:27 PM

## 2023-02-08 DIAGNOSIS — Z9889 Other specified postprocedural states: Secondary | ICD-10-CM | POA: Diagnosis not present

## 2023-02-08 DIAGNOSIS — S12501A Unspecified nondisplaced fracture of sixth cervical vertebra, initial encounter for closed fracture: Secondary | ICD-10-CM | POA: Diagnosis not present

## 2023-02-08 DIAGNOSIS — S12601A Unspecified nondisplaced fracture of seventh cervical vertebra, initial encounter for closed fracture: Secondary | ICD-10-CM | POA: Diagnosis not present

## 2023-02-08 LAB — GLUCOSE, CAPILLARY
Glucose-Capillary: 201 mg/dL — ABNORMAL HIGH (ref 70–99)
Glucose-Capillary: 140 mg/dL — ABNORMAL HIGH (ref 70–99)
Glucose-Capillary: 160 mg/dL — ABNORMAL HIGH (ref 70–99)
Glucose-Capillary: 174 mg/dL — ABNORMAL HIGH (ref 70–99)

## 2023-02-08 LAB — CBC
HCT: 26.9 % — ABNORMAL LOW (ref 39.0–52.0)
Hemoglobin: 8.6 g/dL — ABNORMAL LOW (ref 13.0–17.0)
MCH: 25.4 pg — ABNORMAL LOW (ref 26.0–34.0)
MCHC: 32 g/dL (ref 30.0–36.0)
MCV: 79.4 fL — ABNORMAL LOW (ref 80.0–100.0)
Platelets: 296 10*3/uL (ref 150–400)
RBC: 3.39 MIL/uL — ABNORMAL LOW (ref 4.22–5.81)
RDW: 15.7 % — ABNORMAL HIGH (ref 11.5–15.5)
WBC: 7.9 10*3/uL (ref 4.0–10.5)
nRBC: 0 % (ref 0.0–0.2)

## 2023-02-08 LAB — COMPREHENSIVE METABOLIC PANEL
ALT: 13 U/L (ref 0–44)
AST: 25 U/L (ref 15–41)
Albumin: 2.6 g/dL — ABNORMAL LOW (ref 3.5–5.0)
Alkaline Phosphatase: 88 U/L (ref 38–126)
Anion gap: 10 (ref 5–15)
BUN: 19 mg/dL (ref 8–23)
CO2: 22 mmol/L (ref 22–32)
Calcium: 8.5 mg/dL — ABNORMAL LOW (ref 8.9–10.3)
Chloride: 93 mmol/L — ABNORMAL LOW (ref 98–111)
Creatinine, Ser: 1.61 mg/dL — ABNORMAL HIGH (ref 0.61–1.24)
GFR, Estimated: 41 mL/min — ABNORMAL LOW (ref >60)
Glucose, Bld: 192 mg/dL — ABNORMAL HIGH (ref 70–99)
Potassium: 3.6 mmol/L (ref 3.5–5.1)
Sodium: 125 mmol/L — ABNORMAL LOW (ref 135–145)
Total Bilirubin: 0.8 mg/dL (ref 0.3–1.2)
Total Protein: 6.3 g/dL — ABNORMAL LOW (ref 6.5–8.1)

## 2023-02-08 LAB — CORTISOL: Cortisol, Plasma: 12 ug/dL

## 2023-02-08 LAB — BASIC METABOLIC PANEL
Anion gap: 12 (ref 5–15)
BUN: 17 mg/dL (ref 8–23)
CO2: 23 mmol/L (ref 22–32)
Calcium: 8.5 mg/dL — ABNORMAL LOW (ref 8.9–10.3)
Chloride: 94 mmol/L — ABNORMAL LOW (ref 98–111)
Creatinine, Ser: 1.49 mg/dL — ABNORMAL HIGH (ref 0.61–1.24)
GFR, Estimated: 45 mL/min — ABNORMAL LOW (ref >60)
Glucose, Bld: 144 mg/dL — ABNORMAL HIGH (ref 70–99)
Potassium: 3.9 mmol/L (ref 3.5–5.1)
Sodium: 129 mmol/L — ABNORMAL LOW (ref 135–145)

## 2023-02-08 LAB — TSH: TSH: 1.594 u[IU]/mL (ref 0.350–4.500)

## 2023-02-08 MED ORDER — FUROSEMIDE 20 MG PO TABS
20.0000 mg | ORAL_TABLET | Freq: Every day | ORAL | Status: DC
  Administered 2023-02-08 – 2023-02-10 (×3): 20 mg via ORAL
  Filled 2023-02-08 (×3): qty 1

## 2023-02-08 NOTE — Progress Notes (Signed)
Progress Note   Patient: Duane [name] [id number] DOB: [birth year] DOA: 02/01/2023     7 DOS: the patient was seen and examined on 02/08/2023   Brief hospital course: 88 y.o. male with past medical history of PAF on Eliquis, CAD s/p. 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  Patient was reportedly sent to the ED from his chiropractor's office (son is a Restaurant manager, fast food) after he arrived here from his ALF with blood around his mouth, abrasions on his bilateral forearms, and was complaining of severe pain in his upper back/shoulders.  Son suspects that patient may have fallen off his electric wheelchair while being transported from ALF to his chiropractor/son's office.  ECT imaging showed acute nondisplaced fracture involving C6 lamina with medial lateral mass with extension.  While waiting in the emergency room-patient had a significant decline in neurological exam, emergent MRI showed epidural hemorrhage from C1 to at least T2, and known posterior element fractures at C6 and C7.  Patient then underwent emergent C5-C7 laminectomy and evacuation of epidural hematoma on 3/19.  Preoperatively he was given a dose of Kcentra to reverse Eliquis, and a unit of platelets (patient on Plavix).  Postoperatively-patient was noted to have AKI, hyponatremia-hospitalist service was asked to evaluate this patient for further recommendations.   Baseline-patient lives at ALF-and is able to walk short distances.  He has significant peripheral neuropathy secondary diabetes-and for longer distances-he uses his electric wheelchair.  Patient is otherwise independent in other activities of daily living. No known history of nausea, vomiting, diarrhea, abdominal pain, dysuria, hematuria.  Assessment and Plan: Cervical spine fracture with epidural hematoma-s/p C5-C7  laminectomy, C5-T1 posterior instrumented fusion 3/19 Cont to avoid Toradol due to AKI Cont Tylenol to scheduled dosing Minimize narcotics if at all possible-will place on scheduled MiraLAX/senna for bowel regimen Therapy recs for SNF noted. TOC following Primary service following   AKI on CKD stage IIIb Likely hemodynamically mediated-baseline creatinine 1.6-1.8 range Stopped Toradol Avoid nephrotoxic agents Now with indwelling cath secondary to bladder outlet obstruction -Patient failed voiding trial. Discussed with Urology. Recommendation to keep with temporary indwelling cath as pt is planned to discharge to SNF as early as tomorrow -Recommend pt to f/u with his primary Urologist after discharge -continue on flomax   Hyponatremia Family reported patient has had severe hyponatremia in the past-due to excessive water intake. -Initially, staff had reported pt drinking ample amounts of water while here -very good urine output -Urine Na 29 with low urine osm 266, serum osm 282 -Despite 1200cc fluid restriction and addition of salt tabs on 3/25, sodium trended down further -Appreciate assistance by Nephrology who has added loop diuretic -recheck bmet in AM   PAF Sinus rhythm Continue metoprolol Continue to hold Eliquis-only will be resumed once cleared by neurosurgery given epidural hematoma.   CAD s/p PCI to RCA 2020 No anginal symptoms Prior to admit-on Plavix-this obviously has been on hold due to epidural hematoma-will only be resumed once cleared by neurosurgery   History of CVA 2022 Per son-had right-sided deficits that have essentially resolved with therapy. Plavix/Eliquis on hold per above -Per Neurosurgery, can discuss risk/benefits to anticoag/antiplatelet at 2 weeks f/u   HTN BP stable at present Continue amlodipine/metoprolol   HLD Intolerant to statin   DM-2 with uncontrolled hyperglycemia Cont Semglee-8 units Continue SSI/Tradjenta A1c 7.5      Subjective:  Reports feeling much better this AM  Physical Exam: Vitals:   02/08/23 0300 02/08/23 0741 02/08/23 1214 02/08/23 1546  BP: 123/67 (!) 151/60 (!) 132/57 (!) 134/53  Pulse: 72 73 70 73  Resp: 20 20 15 20   Temp: 99 F (37.2 C) 98.6 F (37 C) 98.9 F (37.2 C) 97.6 F (36.4 C)  TempSrc: Oral Oral Oral Oral  SpO2:  99% 99% 98%  Weight:      Height:       General exam: Conversant, in no acute distress Respiratory system: normal chest rise, clear, no audible wheezing Cardiovascular system: regular rhythm, s1-s2 Gastrointestinal system: Nondistended, nontender, pos BS Central nervous system: No seizures, no tremors Extremities: No cyanosis, no joint deformities Skin: No rashes, no pallor Psychiatry: Affect normal // no auditory hallucinations   Data Reviewed:  Labs reviewed: Na 125, K 3.6, Cr 1.61, Hgb 8.6   Family Communication: Pt in room, family not at bedside  Disposition: Status is: Inpatient Remains inpatient appropriate because: Severity of illness  Planned Discharge Destination: Skilled nursing facility    Author: Marylu Lund, MD 02/08/2023 6:16 PM  For on call review [email or web address].

## 2023-02-08 NOTE — Progress Notes (Signed)
Physical Therapy Treatment Patient Details Name: Duane [name] MRN: [id number] DOB: 09/30/1935 Today's Date: 02/08/2023   History of Present Illness 88 y.o. male admitted 02/01/23 after suspected fall and found to have bilateral shoulder pain, and blood in his mouth.  Patient found to have epidural hematoma and cervical spinal ligamentous injury and change in alignment.   He underwent C5-C7 laminectomies for evacuation of epidural hematoma and C5-T1 posterior instrumented fusion.  PMH positive for a-fib, prior CVA, arthritis, CAD, DM, Eczema, HTN, HLD, CKD III, sleep apnea, MI, prostatic hypertrophy and MI.    PT Comments    Patient seen for bed-level exercises to regain strength in UEs and LEs (no resistance provided to UEs due to recent neck surgery). Patient falling asleep between exercise sets, however able to attend to the current exercise and complete the stated number of reps. RN reports he did not sleep well last night and has been sleepy all day--denies effects of medication. Will attempt to see with +2 assist next visit to progress mobility.     Recommendations for follow up therapy are one component of a multi-disciplinary discharge planning process, led by the attending physician.  Recommendations may be updated based on patient status, additional functional criteria and insurance authorization.  Follow Up Recommendations  Can patient physically be transported by private vehicle: No    Assistance Recommended at Discharge Frequent or constant Supervision/Assistance  Patient can return home with the following Two people to help with walking and/or transfers;Assistance with cooking/housework;Assist for transportation;Two people to help with bathing/dressing/bathroom   Equipment Recommendations  None recommended by PT    Recommendations for Other Services       Precautions / Restrictions Precautions Precautions: Fall Precaution Comments: no brace needed per order, but MD  approved soft collar for comfort Required Braces or Orthoses: Cervical Brace Cervical Brace: For comfort;Soft collar     Mobility  Bed Mobility                    Transfers                        Ambulation/Gait                   Stairs             Wheelchair Mobility    Modified Rankin (Stroke Patients Only)       Balance                                            Cognition Arousal/Alertness: Lethargic (RN denied med effects; just been sleepy all day) Behavior During Therapy: WFL for tasks assessed/performed Overall Cognitive Status: No family/caregiver present to determine baseline cognitive functioning                                 General Comments: history of dementia; easily falling asleep between exercises        Exercises General Exercises - Upper Extremity Shoulder Flexion: AAROM, Both, 10 reps (only reaching up to 80 degrees flexion to limit neck pain) Elbow Flexion: AROM, Both, 10 reps Digit Composite Flexion: AROM, Both, 10 reps Composite Extension: AROM, Both, 10 reps General Exercises - Lower Extremity Ankle Circles/Pumps: AROM, Left, AAROM, Right, 10 reps Short Arc Quad: AROM,  Both, 10 reps (x 2 sets) Heel Slides: AROM, Both, 10 reps (x 2 sets) Hip ABduction/ADduction: AAROM, Both, 10 reps    General Comments        Pertinent Vitals/Pain Pain Assessment Pain Assessment: Faces Faces Pain Scale: Hurts a little bit    Home Living                          Prior Function            PT Goals (current goals can now be found in the care plan section) Acute Rehab PT Goals Patient Stated Goal: improve pain Time For Goal Achievement: 02/16/23 Potential to Achieve Goals: Fair Progress towards PT goals: Progressing toward goals    Frequency    Min 3X/week      PT Plan Current plan remains appropriate    Co-evaluation              AM-PAC PT "6 Clicks"  Mobility   Outcome Measure  Help needed turning from your back to your side while in a flat bed without using bedrails?: A Lot Help needed moving from lying on your back to sitting on the side of a flat bed without using bedrails?: Total Help needed moving to and from a bed to a chair (including a wheelchair)?: Total Help needed standing up from a chair using your arms (e.g., wheelchair or bedside chair)?: Total Help needed to walk in hospital room?: Total Help needed climbing 3-5 steps with a railing? : Total 6 Click Score: 7    End of Session   Activity Tolerance: Patient tolerated treatment well Patient left: in bed;with call bell/phone within reach;with bed alarm set   PT Visit Diagnosis: Other abnormalities of gait and mobility (R26.89);Muscle weakness (generalized) (M62.81);Pain Pain - part of body:  (neck)     Time: [id number] PT Time Calculation (min) (ACUTE ONLY): 14 min  Charges:  $Therapeutic Exercise: 8-22 mins                      Arby Barrette, PT Acute Rehabilitation Services  Office [phone]    Rexanne Mano 02/08/2023, 4:18 PM

## 2023-02-08 NOTE — Consult Note (Signed)
Reason for Consult: Hyponatremia Referring Physician: Marylu Lund MD [facility])  HPI:  88 year old man with past medical history significant for paroxysmal atrial fibrillation on Eliquis, coronary artery disease status post PTCA, history of CVA with transient right-sided neurological deficits, hypertension, dementia and baseline chronic kidney disease stage IIIb (creatinine 1.4-1.6).  He also has a history of intermittent hyponatremia with acute illness noted in the past (daughter at bedside states it was primarily from excessive water intake).  He was admitted through the emergency room with severe upper back/shoulder pain and blood around his mouth with evaluation showing an acute nondisplaced fracture involving C6 lamina; additional evaluation showed an epidural hematoma extending C1-T2 (while on Eliquis for which she was given Kcentra and a unit of platelets) for which he underwent emergent C5-C7 laminectomy and evacuation of the epidural hematoma on 3/19.  Postoperatively, found to have acute kidney injury and hyponatremia of which both appeared to be improving with conservative management until 2 days ago when sodium level was noted to start dropping again (down from 130-125).  On 3/22 he had a Foley catheter placement for urinary retention/bladder outlet obstruction along with deflation of his penile prosthesis.  He was started on fluid restriction yesterday morning and sodium tablets yesterday afternoon.  He is somnolent when seen and remains confused when awoken but able to respond to closed ended questions appropriately. He denies any nausea, vomiting, cough, chest pain or problems with shortness of breath/cough. Serum osmolality on 02/07/2023 was normal at 282 (calculated serum osmolality 275 and not reflective of a significant osmolar gap/pseudohyponatremia) with urine osmolality inappropriately elevated at 266.  Past Medical History:  Diagnosis Date   A-fib ([place])    Anemia    Angina pectoris  (HCC)    Arthritis    Carpal tunnel syndrome    Cobalamin deficiency    Coronary artery disease    Diabetes mellitus without complication (HCC)    Eczema    Hyperlipemia    Hypertension    Kidney disease, chronic, stage III (moderate, EGFR 30-59 ml/min) (HCC)    Lung infiltrate    Myocardial infarct, old    Prostatic hypertrophy    Sleep apnea, obstructive    Squamous cell cancer of lip     Past Surgical History:  Procedure Laterality Date   CORONARY ANGIOPLASTY WITH STENT PLACEMENT     POSTERIOR CERVICAL FUSION/FORAMINOTOMY N/A 02/01/2023   Procedure: CERVICAL FIVE - THORACIC ONE LAMINECTOMY, AND INSTRUMENTED FUSION;  Surgeon: Judith Part, MD;  Location: [facility];  Service: Neurosurgery;  Laterality: N/A;    Family History  Problem Relation Age of Onset   Hypertension Other     Social History:  reports that he has never smoked. He has never used smokeless tobacco. He reports that he does not drink alcohol. No history on file for drug use.  Allergies:  Allergies  Allergen Reactions   Baclofen Other (See Comments)    Severe AMS, comatose, intubated   Bee Venom Anaphylaxis   Contrast Media [Iodinated Contrast Media] Anaphylaxis   Iodine-131 Anaphylaxis   Metrizamide Anaphylaxis   Metronidazole Anaphylaxis   Atorvastatin Other (See Comments)    Aching in legs   Lisinopril Cough    Medications: I have reviewed the patient's current medications. Scheduled:  sodium chloride   Intravenous Once   acetaminophen  1,000 mg Oral Q8H   amLODipine  10 mg Oral Daily   Chlorhexidine Gluconate Cloth  6 each Topical Daily   escitalopram  5 mg Oral Daily  fenofibrate  160 mg Oral Daily   furosemide  20 mg Oral Daily   insulin aspart  0-15 Units Subcutaneous TID WC   insulin glargine-yfgn  8 Units Subcutaneous Daily   lidocaine  1 patch Transdermal Q24H   linagliptin  5 mg Oral Daily   memantine  5 mg Oral Daily   metoprolol succinate  50 mg Oral Daily   pantoprazole  20  mg Oral Daily   polyethylene glycol  17 g Oral BID   sodium chloride flush  3 mL Intravenous Q12H   sodium chloride  1 g Oral BID WC   tamsulosin  0.4 mg Oral Daily   Continuous:  sodium chloride Stopped (02/02/23 1806)       Latest Ref Rng & Units 02/08/2023    3:11 AM 02/07/2023   10:08 AM 02/06/2023    3:34 AM  BMP  Glucose 70 - 99 mg/dL 192  174  181   BUN 8 - 23 mg/dL 19  20  23    Creatinine 0.61 - 1.24 mg/dL 1.61  1.50  1.60   Sodium 135 - 145 mmol/L 125  129  128   Potassium 3.5 - 5.1 mmol/L 3.6  4.3  4.0   Chloride 98 - 111 mmol/L 93  95  96   CO2 22 - 32 mmol/L 22  21  21    Calcium 8.9 - 10.3 mg/dL 8.5  8.7  8.5       Latest Ref Rng & Units 02/08/2023    3:11 AM 02/07/2023   10:08 AM 02/06/2023    3:34 AM  CBC  WBC 4.0 - 10.5 K/uL 7.9  8.6  8.0   Hemoglobin 13.0 - 17.0 g/dL 8.6  9.0  8.7   Hematocrit 39.0 - 52.0 % 26.9  28.1  28.5   Platelets 150 - 400 K/uL 296  302  262    Urinalysis    Component Value Date/Time   COLORURINE YELLOW 02/02/2023 1809   APPEARANCEUR HAZY (A) 02/02/2023 1809   LABSPEC 1.018 02/02/2023 1809   PHURINE 5.0 02/02/2023 1809   GLUCOSEU >=500 (A) 02/02/2023 1809   HGBUR NEGATIVE 02/02/2023 1809   BILIRUBINUR NEGATIVE 02/02/2023 1809   KETONESUR NEGATIVE 02/02/2023 1809   PROTEINUR NEGATIVE 02/02/2023 1809   NITRITE NEGATIVE 02/02/2023 1809   LEUKOCYTESUR MODERATE (A) 02/02/2023 1809      No results found.  Review of Systems  Unable to perform ROS: Other (Somnolent/intermittently confused, underlying dementia)   Blood pressure 123/67, pulse 72, temperature 99 F (37.2 C), temperature source Oral, resp. rate 20, height 5\' 11"  (1.803 m), weight 94.5 kg, SpO2 98 %. Physical Exam Vitals and nursing note reviewed.  Constitutional:      General: He is not in acute distress.    Appearance: He is normal weight. He is not ill-appearing.  HENT:     Head: Normocephalic and atraumatic.     Right Ear: External ear normal.     Left Ear:  External ear normal.     Nose: Nose normal.     Mouth/Throat:     Mouth: Mucous membranes are moist.     Pharynx: Oropharynx is clear. No oropharyngeal exudate.  Eyes:     General: No scleral icterus.    Extraocular Movements: Extraocular movements intact.     Conjunctiva/sclera: Conjunctivae normal.  Cardiovascular:     Rate and Rhythm: Normal rate and regular rhythm.     Pulses: Normal pulses.     Heart sounds: Normal  heart sounds.  Pulmonary:     Effort: Pulmonary effort is normal.     Breath sounds: Normal breath sounds. No wheezing or rales.  Abdominal:     General: Bowel sounds are normal. There is distension.     Palpations: Abdomen is soft.     Tenderness: There is no abdominal tenderness. There is no guarding.     Comments: Obese/mildly distended  Musculoskeletal:     Right lower leg: No edema.     Left lower leg: No edema.  Skin:    General: Skin is warm and dry.     Coloration: Skin is not pale.     Assessment/Plan: 1.  Hyponatremia: Appears to be true hypoosmolar hyponatremia in a euvolemic patient.  Data suggests inappropriate ADH activation and I will send off for a TSH and cortisol level to complete this workup.  He has been started on sodium tablets for solute loading given preceding history of poor intake and is currently on fluid restriction; both of which are appropriate at this time.  I will begin him on low-dose loop diuretic furosemide 20 mg daily and continue to follow sodium level.  From a renal standpoint, would be hesitant to have him discharged with a decreasing sodium for fear that this will continue to worsen and contribute to imbalance/additional injury and obstacles towards rehabilitation. 2.  Acute kidney injury on chronic kidney disease stage III: Appears to have been primarily hemodynamically mediated in the setting of his C-spine fracture with possible exacerbation by urinary retention and Toradol use.  Status post Foley catheter placement and with  renal function that appears to be back to baseline. 3.  C-spine fracture complicated by epidural abscess with ongoing Eliquis use: Status post C5-C7 laminectomy along with C5-T1 posterior instrumented fusion on 3/19.  Plans noted for discharge to skilled nursing facility when medically stable. 4.  Paroxysmal atrial fibrillation: Currently in sinus rhythm and rate controlled.  Eliquis on hold.  Yamil Oelke K. 02/08/2023, 11:24 AM

## 2023-02-08 NOTE — Plan of Care (Signed)
  Problem: Education: Goal: Knowledge of General Education information will improve Description: Including pain rating scale, medication(s)/side effects and non-pharmacologic comfort measures Outcome: Progressing   Problem: Health Behavior/Discharge Planning: Goal: Ability to manage health-related needs will improve Outcome: Progressing   Problem: Clinical Measurements: Goal: Ability to maintain clinical measurements within normal limits will improve Outcome: Progressing Goal: Will remain free from infection Outcome: Progressing Goal: Diagnostic test results will improve Outcome: Progressing Goal: Respiratory complications will improve Outcome: Progressing Goal: Cardiovascular complication will be avoided Outcome: Progressing   Problem: Activity: Goal: Risk for activity intolerance will decrease Outcome: Progressing   Problem: Nutrition: Goal: Adequate nutrition will be maintained Outcome: Progressing   Problem: Coping: Goal: Level of anxiety will decrease Outcome: Progressing   Problem: Elimination: Goal: Will not experience complications related to bowel motility Outcome: Progressing Goal: Will not experience complications related to urinary retention Outcome: Progressing   Problem: Pain Managment: Goal: General experience of comfort will improve Outcome: Progressing   Problem: Safety: Goal: Ability to remain free from injury will improve Outcome: Progressing   Problem: Skin Integrity: Goal: Risk for impaired skin integrity will decrease Outcome: Progressing   Problem: Education: Goal: Ability to describe self-care measures that may prevent or decrease complications (Diabetes Survival Skills Education) will improve Outcome: Progressing Goal: Individualized Educational Video(s) Outcome: Progressing   Problem: Coping: Goal: Ability to adjust to condition or change in health will improve Outcome: Progressing   Problem: Fluid Volume: Goal: Ability to  maintain a balanced intake and output will improve Outcome: Progressing   Problem: Health Behavior/Discharge Planning: Goal: Ability to identify and utilize available resources and services will improve Outcome: Progressing Goal: Ability to manage health-related needs will improve Outcome: Progressing   Problem: Metabolic: Goal: Ability to maintain appropriate glucose levels will improve Outcome: Progressing   Problem: Nutritional: Goal: Maintenance of adequate nutrition will improve Outcome: Progressing Goal: Progress toward achieving an optimal weight will improve Outcome: Progressing   Problem: Skin Integrity: Goal: Risk for impaired skin integrity will decrease Outcome: Progressing   Problem: Tissue Perfusion: Goal: Adequacy of tissue perfusion will improve Outcome: Progressing   Problem: Education: Goal: Ability to verbalize activity precautions or restrictions will improve Outcome: Progressing Goal: Knowledge of the prescribed therapeutic regimen will improve Outcome: Progressing Goal: Understanding of discharge needs will improve Outcome: Progressing   Problem: Activity: Goal: Ability to avoid complications of mobility impairment will improve Outcome: Progressing Goal: Ability to tolerate increased activity will improve Outcome: Progressing Goal: Will remain free from falls Outcome: Progressing   Problem: Bowel/Gastric: Goal: Gastrointestinal status for postoperative course will improve Outcome: Progressing   Problem: Clinical Measurements: Goal: Ability to maintain clinical measurements within normal limits will improve Outcome: Progressing Goal: Postoperative complications will be avoided or minimized Outcome: Progressing Goal: Diagnostic test results will improve Outcome: Progressing   Problem: Pain Management: Goal: Pain level will decrease Outcome: Progressing   Problem: Skin Integrity: Goal: Will show signs of wound healing Outcome:  Progressing   Problem: Health Behavior/Discharge Planning: Goal: Identification of resources available to assist in meeting health care needs will improve Outcome: Progressing   Problem: Bladder/Genitourinary: Goal: Urinary functional status for postoperative course will improve Outcome: Progressing   

## 2023-02-08 NOTE — Progress Notes (Signed)
Neurosurgery Service Progress Note  Subjective: No acute events overnight, he was comfortable today, no complaints, neck improved again  Objective: Vitals:   02/07/23 1114 02/07/23 1941 02/07/23 2230 02/08/23 0300  BP: (!) 133/54 133/65 (!) 145/56 123/67  Pulse: 64   72  Resp: 18   20  Temp: 97.7 F (36.5 C) 97.6 F (36.4 C) 98.2 F (36.8 C) 99 F (37.2 C)  TempSrc: Oral Oral Oral Oral  SpO2: 99% 98% 98%   Weight:      Height:        Physical Exam: Ox1, strength 4 to 4+/5 in BUE, 4+/5 in BLE, incision c/d/I  Assessment & Plan: 88 y.o. man s/p fracture stabilization, evac of epidural hematoma, recovering well.  -hold plavix until POD10 (3/29), hold eliquis for now - can discuss risks / benefits at follow up -medicine recs -discharge pending SIADH workup / nephrology recs / Na trend  Judith Part  02/08/23 7:51 AM

## 2023-02-09 DIAGNOSIS — E1159 Type 2 diabetes mellitus with other circulatory complications: Secondary | ICD-10-CM

## 2023-02-09 DIAGNOSIS — I480 Paroxysmal atrial fibrillation: Secondary | ICD-10-CM

## 2023-02-09 DIAGNOSIS — Z8673 Personal history of transient ischemic attack (TIA), and cerebral infarction without residual deficits: Secondary | ICD-10-CM

## 2023-02-09 DIAGNOSIS — E785 Hyperlipidemia, unspecified: Secondary | ICD-10-CM

## 2023-02-09 DIAGNOSIS — I10 Essential (primary) hypertension: Secondary | ICD-10-CM

## 2023-02-09 DIAGNOSIS — N1832 Chronic kidney disease, stage 3b: Secondary | ICD-10-CM | POA: Diagnosis not present

## 2023-02-09 LAB — RENAL FUNCTION PANEL
Albumin: 2.4 g/dL — ABNORMAL LOW (ref 3.5–5.0)
Anion gap: 10 (ref 5–15)
BUN: 15 mg/dL (ref 8–23)
CO2: 25 mmol/L (ref 22–32)
Calcium: 8.4 mg/dL — ABNORMAL LOW (ref 8.9–10.3)
Chloride: 94 mmol/L — ABNORMAL LOW (ref 98–111)
Creatinine, Ser: 1.51 mg/dL — ABNORMAL HIGH (ref 0.61–1.24)
GFR, Estimated: 45 mL/min — ABNORMAL LOW (ref >60)
Glucose, Bld: 142 mg/dL — ABNORMAL HIGH (ref 70–99)
Phosphorus: 3.3 mg/dL (ref 2.5–4.6)
Potassium: 3.7 mmol/L (ref 3.5–5.1)
Sodium: 129 mmol/L — ABNORMAL LOW (ref 135–145)

## 2023-02-09 LAB — GLUCOSE, CAPILLARY
Glucose-Capillary: 151 mg/dL — ABNORMAL HIGH (ref 70–99)
Glucose-Capillary: 148 mg/dL — ABNORMAL HIGH (ref 70–99)
Glucose-Capillary: 141 mg/dL — ABNORMAL HIGH (ref 70–99)
Glucose-Capillary: 188 mg/dL — ABNORMAL HIGH (ref 70–99)

## 2023-02-09 MED ORDER — BISACODYL 10 MG RE SUPP
10.0000 mg | Freq: Every day | RECTAL | Status: DC | PRN
  Administered 2023-02-09: 10 mg via RECTAL
  Filled 2023-02-09: qty 1

## 2023-02-09 MED ORDER — TAMSULOSIN HCL 0.4 MG PO CAPS: 0.4000 mg | ORAL_CAPSULE | Freq: Every day | ORAL | Status: DC

## 2023-02-09 NOTE — Progress Notes (Signed)
PROGRESS NOTE    Duane [name]  [id number] DOB: Jul 20, 1935 DOA: 02/01/2023 PCP: Javier Glazier, MD   Assessment and Plan:  AKI on CKD stage IIIb Baseline creatinine of about 1.6-1.8. Peak creatinine this admission of 2.16. Toradol discontinued. Complicated by evidence of bladder outlet obstruction requiring the placement of a temporary indwelling catheter. Creatinine improved to baseline.  Hyponatremia Euvolemic with true hypotonic hyponatremia noted by reduced osmolality. Patient managed with fluid restriction and salt tablets with continued worsening sodium to a low of 125. Nephrology was consulted and started low-dose Lasix with improvement of sodium. -Management per nephrology  Bladder outlet obstruction Patient required foley catheter placement on 02/06/23. Urology recommending if chronic catheter is required, to consider suprapubic tube to avoid risk of erosion to patient's penile prosthesis.  Paroxysmal atrial fibrillation Currently in atrial fibrillation, managed on metoprolol and Eliquis. Rate controlled. Eliquis held secondary to surgery. -Continue metoprolol -Resume Eliquis per primary  CAD Hyperlipidemia -Continue fenofibrate  History of CVA Patient is on Plavix as an outpatient which is held secondary to surgery. -Resume Plavix per priamry  Primary hypertension -Continue amlodipine and metoprolol  Diabetes mellitus type 2 Controlled for age, as most recent hemoglobin A1C is 7.5%. patient is managed on metformin, Januvia, Levemir and Humalog as an outpatient.  GERD -Continue Protonix  Cervical spine fracture with epidural hematoma -Per primary   Discharge recommendations: Primary hypertension Continue outpatient regimen (amlodipine, metoprolol) Diabetes mellitus type 2 Continue outpatient regimen (Metformin, Januvia, Levemir 5 units qHS, Humalog sliding scale) CAD/CVA/Hyperlipidemia Eliquis/Plavix per primary, otherwise, continue outpatient  regimen (fenofibrate, omega 3 fatty acids) Bladder outlet obstruction Continue Flomax and foley catheter on discharge Urology recommending consideration of suprapubic catheter to avoid erosion to penile prosthesis   Hyponatremia Per nephrology recommendations CKD stage IIIb Per nephrology recommendations  DVT prophylaxis: Per Primary Code Status:   Code Status: Full Code Family Communication: None at bedside Disposition Plan: Per Primary. Awaiting Nephrology recommendations for discharge readiness   Subjective: Patient reports no concerns this morning. Not aware of his recent surgery  Objective: BP (!) 141/57 (BP Location: Left Arm)   Pulse 77   Temp 97.8 F (36.6 C) (Oral)   Resp 18   Ht 5\' 11"  (1.803 m)   Wt 94.5 kg   SpO2 97%   BMI 29.06 kg/m   Examination:  General exam: Appears calm and comfortable Respiratory system: Clear to auscultation. Respiratory effort normal. Cardiovascular system: S1 & S2 heard, irregular rhythm, normal rate. Gastrointestinal system: Abdomen is nondistended, soft and nontender. No organomegaly or masses felt. Normal bowel sounds heard. Central nervous system: Alert and oriented x2. No focal neurological deficits. Musculoskeletal: No edema. No calf tenderness Skin: No cyanosis. No rashes Psychiatry: Judgement and insight appear normal. Mood & affect appropriate.    Data Reviewed: I have personally reviewed following labs and imaging studies  CBC Lab Results  Component Value Date   WBC 7.9 02/08/2023   RBC 3.39 (L) 02/08/2023   HGB 8.6 (L) 02/08/2023   HCT 26.9 (L) 02/08/2023   MCV 79.4 (L) 02/08/2023   MCH 25.4 (L) 02/08/2023   PLT 296 02/08/2023   MCHC 32.0 02/08/2023   RDW 15.7 (H) 02/08/2023   LYMPHSABS 3.7 02/01/2023   MONOABS 1.1 (H) 02/01/2023   EOSABS 0.3 02/01/2023   BASOSABS 0.1 [id number]     Last metabolic panel Lab Results  Component Value Date   NA 129 (L) 02/09/2023   K 3.7 02/09/2023   CL 94 (L)  02/09/2023   CO2 25 02/09/2023   BUN 15 02/09/2023   CREATININE 1.51 (H) 02/09/2023   GLUCOSE 142 (H) 02/09/2023   GFRNONAA 45 (L) 02/09/2023   GFRAA >60 11/04/2019   CALCIUM 8.4 (L) 02/09/2023   PHOS 3.3 02/09/2023   PROT 6.3 (L) 02/08/2023   ALBUMIN 2.4 (L) 02/09/2023   BILITOT 0.8 02/08/2023   ALKPHOS 88 02/08/2023   AST 25 02/08/2023   ALT 13 02/08/2023   ANIONGAP 10 02/09/2023    GFR: Estimated Creatinine Clearance: 41.2 mL/min (A) (by C-G formula based on SCr of 1.51 mg/dL (H)).  Recent Results (from the past 240 hour(s))  Surgical PCR screen     Status: None   Collection Time: 02/02/23  3:57 AM   Specimen: Nasal Mucosa; Nasal Swab  Result Value Ref Range Status   MRSA, PCR NEGATIVE NEGATIVE Final   Staphylococcus aureus NEGATIVE NEGATIVE Final    Comment: (NOTE) The Xpert SA Assay (FDA approved for NASAL specimens in patients 27 years of age and older), is one component of a comprehensive surveillance program. It is not intended to diagnose infection nor to guide or monitor treatment. Performed at [facility], [place] [address] [place],  [address]       Radiology Studies: No results found.    LOS: 8 days    Cordelia Poche, MD [facility] 02/09/2023, 8:35 AM   If 7PM-7AM, please contact night-coverage [email or web address]

## 2023-02-09 NOTE — Progress Notes (Signed)
Orthopedic Tech Progress Note Patient Details:  Duane [name] [birth year] [id number] [facility] J cervical collar was delivered to patient's room Ortho Devices Type of Ortho Device: Soft collar Ortho Device/Splint Interventions: Duane [name] 02/09/2023, 5:21 PM

## 2023-02-09 NOTE — TOC Progression Note (Signed)
Transition of Care [facility]) - Progression Note    Patient Details  Name: Duane [name] MRN: [id number] Date of Birth: 08-Dec-1934  [facility]) CM/SW [place],  Phone Number: 02/09/2023, 1:31 PM  Clinical Narrative:     Left voice message w/ [facility]- provided update- patient will need reauthorization if he does not tomorrow.   TOC will continue to follow and assist with discharge planning.   Expected Discharge Plan: Skilled Nursing Facility Barriers to Discharge: Continued Medical Work up, SNF Pending bed offer  Expected Discharge Plan and Services In-house Referral: Clinical Social Work   Post Acute Care Choice: IP Rehab, [place] Living arrangements for the past 2 months: [place]                                       Social Determinants of Health (SDOH) Interventions SDOH Screenings   Food Insecurity: No Food Insecurity (02/03/2023)  Housing: Low Risk  (02/03/2023)  Transportation Needs: No Transportation Needs (02/03/2023)  Utilities: Not At Risk (02/03/2023)  Tobacco Use: Low Risk  (02/02/2023)    Readmission Risk Interventions     No data to display

## 2023-02-09 NOTE — Progress Notes (Signed)
Patient ID: Duane [name], male   DOB: 05-30-1935, 88 y.o.   MRN: [id number] [facility] Progress Note   Assessment/ Plan:   1.  Hyponatremia: True hypoosmolar hyponatremia in a euvolemic patient.  Data points to inappropriate ADH activation and TSH and cortisol levels normal.  Sodium level improving on sodium tablets for solute loading and on fluid restriction- would continue sodium chloride tablets for ONLY 5 days upon discharge (STOP on 02/15/2023).  Continue low-dose loop diuretic furosemide 20 mg daily and recommend SNF to check his BMET once a week to trend sodium levels/renal function. 2.  Acute kidney injury on chronic kidney disease stage III: Appears to have been primarily hemodynamically mediated in the setting of his C-spine fracture with possible exacerbation by urinary retention and Toradol use.  Status post Foley catheter placement and with renal function that appears to be back to baseline. 3.  C-spine fracture complicated by epidural abscess with ongoing Eliquis use: Status post C5-C7 laminectomy along with C5-T1 posterior instrumented fusion on 3/19.  Plans noted for discharge to skilled nursing facility when medically stable. 4.  Paroxysmal atrial fibrillation: Currently in sinus rhythm and rate controlled.  Eliquis on hold.  Subjective:   Reports to be feeling better today- denies chest pain or shortness of breath   Objective:   BP (!) 141/57 (BP Location: Left Arm)   Pulse 77   Temp 97.8 F (36.6 C) (Oral)   Resp 18   Ht 5\' 11"  (1.803 m)   Wt 94.5 kg   SpO2 97%   BMI 29.06 kg/m   Intake/Output Summary (Last 24 hours) at 02/09/2023 1016 Last data filed at 02/09/2023 0807 Gross per 24 hour  Intake 460 ml  Output 2900 ml  Net -2440 ml   Weight change:   Physical Exam: [id number] sitting up in bed brushing teeth [id number] regular rhythm and normal rate, normal S1 and S2 Resp:Clear to auscultation bilaterally, no rales [id number], obese,  non-tender Ext:No pedal edema  Imaging: No results found.  Labs: BMET Recent Labs  Lab 02/04/23 0359 02/05/23 0532 02/06/23 0334 02/07/23 1008 02/08/23 0311 02/08/23 1535 02/09/23 0352  NA 129* 130* 128* 129* 125* 129* 129*  K 4.4 4.1 4.0 4.3 3.6 3.9 3.7  CL 101 95* 96* 95* 93* 94* 94*  CO2 18* 21* 21* 21* 22 23 25   GLUCOSE 159* 174* 181* 174* 192* 144* 142*  BUN 30* 28* 23 20 19 17 15   CREATININE 2.06* 1.78* 1.60* 1.50* 1.61* 1.49* 1.51*  CALCIUM 7.6* 8.6* 8.5* 8.7* 8.5* 8.5* 8.4*  PHOS  --   --   --   --   --   --  3.3   CBC Recent Labs  Lab 02/05/23 0532 02/06/23 0334 02/07/23 1008 02/08/23 0311  WBC 11.2* 8.0 8.6 7.9  HGB 8.4* 8.7* 9.0* 8.6*  HCT 27.0* 28.5* 28.1* 26.9*  MCV 80.4 83.3 79.2* 79.4*  PLT 238 262 302 296    Medications:     sodium chloride   Intravenous Once   acetaminophen  1,000 mg Oral Q8H   amLODipine  10 mg Oral Daily   Chlorhexidine Gluconate Cloth  6 each Topical Daily   escitalopram  5 mg Oral Daily   fenofibrate  160 mg Oral Daily   furosemide  20 mg Oral Daily   insulin aspart  0-15 Units Subcutaneous TID WC   insulin glargine-yfgn  8 Units Subcutaneous Daily   lidocaine  1 patch Transdermal Q24H   linagliptin  5 mg Oral Daily   memantine  5 mg Oral Daily   metoprolol succinate  50 mg Oral Daily   pantoprazole  20 mg Oral Daily   polyethylene glycol  17 g Oral BID   sodium chloride flush  3 mL Intravenous Q12H   sodium chloride  1 g Oral BID WC   tamsulosin  0.4 mg Oral Daily    Elmarie Shiley, MD 02/09/2023, 10:16 AM

## 2023-02-09 NOTE — Progress Notes (Signed)
Neurosurgery Service Progress Note  Subjective: No acute events overnight, no new complaints today  Objective: Vitals:   02/08/23 1941 02/08/23 2307 02/09/23 0315 02/09/23 0735  BP: 138/65 120/63 (!) 148/66 (!) 141/57  Pulse: 76 69 76 77  Resp: 20 17 19 18   Temp: 98.8 F (37.1 C) 97.9 F (36.6 C) 98.4 F (36.9 C) 97.8 F (36.6 C)  TempSrc: Oral Axillary Oral Oral  SpO2: 95% 96% 95% 97%  Weight:      Height:        Physical Exam: Ox1, strength 4 to 4+/5 in BUE, 4+/5 in BLE, incision c/d/I  Assessment & Plan: 88 y.o. man s/p fracture stabilization, evac of epidural hematoma, recovering well.  -hold plavix until POD10 (3/29), hold eliquis for now - can discuss risks / benefits at follow up -medicine recs -discharge pending SIADH workup / nephrology recs / Na trend, will await guidance  Judith Part  02/09/23 8:53 AM

## 2023-02-09 NOTE — Progress Notes (Signed)
Occupational Therapy Treatment Patient Details Name: Duane [name] MRN: [id number] DOB: 12-Dec-1934 Today's Date: 02/09/2023   History of present illness 88 y.o. male admitted 02/01/23 after suspected fall and found to have bilateral shoulder pain, and blood in his mouth.  Patient found to have epidural hematoma and cervical spinal ligamentous injury and change in alignment.   He underwent C5-C7 laminectomies for evacuation of epidural hematoma and C5-T1 posterior instrumented fusion.  PMH positive for a-fib, prior CVA, arthritis, CAD, DM, Eczema, HTN, HLD, CKD III, sleep apnea, MI, prostatic hypertrophy and MI.   OT comments  Pt still with severe pain in posterior neck when sitting up or standing.  Mod +2 for sit to stand in the [place] during simulated LB selfcare and toileting tasks.  Pt with increased cervical flexion with inability to hold head to neutral extension.  Soft cervical collar does not help and provide enough support into extension.  May benefit from hard collar trial as pain is his biggest limiting factor.  Feel he continues to need extensive acute care OT at this time to help increase OOB tolerance and active participation in selfcare tasks sit to stand.     Recommendations for follow up therapy are one component of a multi-disciplinary discharge planning process, led by the attending physician.  Recommendations may be updated based on patient status, additional functional criteria and insurance authorization.    Assistance Recommended at Discharge Frequent or constant Supervision/Assistance  Patient can return home with the following  Two people to help with walking and/or transfers;Two people to help with bathing/dressing/bathroom;Assist for transportation;Help with stairs or ramp for entrance;Assistance with cooking/housework   Equipment Recommendations  Other (comment) (TBD next venue of care)       Precautions / Restrictions Precautions Precautions: Fall Precaution  Comments: no brace needed per order, but MD approved soft collar for comfort Required Braces or Orthoses: Cervical Brace Cervical Brace: For comfort;Soft collar Restrictions Weight Bearing Restrictions: No       Mobility Bed Mobility Overal bed mobility: Needs Assistance Bed Mobility: Rolling, Sidelying to Sit Rolling: Mod assist Sidelying to sit: +2 for physical assistance, Mod assist, HOB elevated       General bed mobility comments: Pt continues to need mod assist for all aspects of mobility.    Transfers Overall transfer level: Needs assistance Equipment used: Ambulation equipment used Transfers: Sit to/from Stand Sit to Stand: +2 physical assistance, From elevated surface, Mod assist           General transfer comment: Pt able to achieve more upright posture in standing but still with decreased ability to achieve cervical extension and maintain. Transfer via Lift Equipment: Stedy   Balance Overall balance assessment: Needs assistance Sitting-balance support: Feet supported Sitting balance-Leahy Scale: Poor Sitting balance - Comments: Posterior lean with occasional assist needed to correct Postural control: Posterior lean Standing balance support: Bilateral upper extremity supported Standing balance-Leahy Scale: Poor Standing balance comment: Pt needs therapist assist and BUE support to achieve standing and maintain in the [place].                           ADL either performed or assessed with clinical judgement   ADL Overall ADL's : Needs assistance/impaired Eating/Feeding: Minimal assistance;Sitting Eating/Feeding Details (indicate cue type and reason): based on poor positioning in the bed and increased pain.  Transitioned to chair for better positioning but pt with increased internal distraction from pain and would not eat.  Toilet Transfer: +2 for physical assistance;Total assistance Toilet Transfer Details (indicate cue  type and reason): simulated with use of the [employer]- Water quality scientist and Hygiene: Total assistance;+2 for safety/equipment;Sit to/from stand Toileting - Clothing Manipulation Details (indicate cue type and reason): simulated with use of the [employer] Transfer: +2 for safety/equipment Tub/Shower Transfer Details (indicate cue type and reason): simulated with use Functional mobility during ADLs: Total assistance;+2 for physical assistance General ADL Comments: Pt with better standing using the [place] this session compared to previous session, however still limited by increased posterior cervical pain.  Pt frequently saying he can't stay in this position and has to move and feels that we are trying to "kill him" because the pain is so great.  Soft cervical collar is utilized but does not provide enough support to help with pain control.  Therapist at times helped provide greater cervical extension to help with pain, which it did, but not to a great extent.  Pt was able to stand up taller this session in the [place] and could maintain it for longer period of time up to approximately one minute.      Cognition Arousal/Alertness: Awake/alert Behavior During Therapy: Anxious (secondary to increased pain) Overall Cognitive Status: No family/caregiver present to determine baseline cognitive functioning                                 General Comments: history of dementia; not oriented to place, month, situation              General Comments Son present throughout session and very supportive    Pertinent Vitals/ Pain       Pain Assessment Pain Assessment: Faces Faces Pain Scale: Hurts worst Pain Descriptors / Indicators: Aching, Discomfort, Moaning, Sore, Throbbing Pain Intervention(s): Limited activity within patient's tolerance, Repositioned, Premedicated before session, Monitored during session, Relaxation, Patient requesting pain meds-RN notified          Frequency  Min 2X/week        Progress Toward Goals  OT Goals(current goals can now be found in the care plan section)  Progress towards OT goals: Progressing toward goals  Acute Rehab OT Goals Patient Stated Goal: Pt wants his pain to improve OT Goal Formulation: With patient Time For Goal Achievement: 02/16/23 Potential to Achieve Goals: [place] Discharge plan remains appropriate    Co-evaluation    PT/OT/SLP Co-Evaluation/Treatment: Yes Reason for Co-Treatment: To address functional/ADL transfers;For patient/therapist safety PT goals addressed during session: Mobility/safety with mobility;Balance OT goals addressed during session: ADL's and self-care      AM-PAC OT "6 Clicks" Daily Activity     Outcome Measure   Help from another person eating meals?: A Little Help from another person taking care of personal grooming?: A Little Help from another person toileting, which includes using toliet, bedpan, or urinal?: Total Help from another person bathing (including washing, rinsing, drying)?: Total Help from another person to put on and taking off regular upper body clothing?: A Lot Help from another person to put on and taking off regular lower body clothing?: Total 6 Click Score: 11    End of Session Equipment Utilized During Treatment: Cervical collar (cervical collar for comfort)  OT Visit Diagnosis: Unsteadiness on feet (R26.81);Muscle weakness (generalized) (M62.81);Pain;Other symptoms and signs involving cognitive function;Repeated falls (R29.6);History of falling (Z91.81) Pain - part of body:  (posterior neck pain)   Activity Tolerance Patient limited  by pain   Patient Left in chair;with call bell/phone within reach;with chair alarm set   Nurse Communication Mobility status;Need for lift equipment        Time: [id number] OT Time Calculation (min): 27 min  Charges: OT General Charges $OT Visit: 1 Visit OT Treatments $Self Care/Home Management :  8-22 mins  Clyda Greener, OTR/L [place]  Office [phone] 02/09/2023

## 2023-02-09 NOTE — Progress Notes (Signed)
Pt CBG 118. Notified A. Zebedee Iba, NP. No additional coverage ordered for tonight.

## 2023-02-09 NOTE — Progress Notes (Signed)
Physical Therapy Treatment Patient Details Name: Duane [name] MRN: [id number] DOB: 02/06/1935 Today's Date: 02/09/2023   History of Present Illness 88 y.o. male admitted 02/01/23 after suspected fall and found to have bilateral shoulder pain, and blood in his mouth.  Patient found to have epidural hematoma and cervical spinal ligamentous injury and change in alignment.   He underwent C5-C7 laminectomies for evacuation of epidural hematoma and C5-T1 posterior instrumented fusion.  PMH positive for a-fib, prior CVA, arthritis, CAD, DM, Eczema, HTN, HLD, CKD III, sleep apnea, MI, prostatic hypertrophy and MI.    PT Comments    Patient continues with severe cervical pain with all mobility. Gains relief when head supported at his forehead and brought into extension (although never reaches neutral). Soft collar used for support, but it does not provide enough extension moment to relieve his pain. Despite pain, he was able to stand with less physical assist and with better upright posture. Able to use stedy and +2 mod assist to transfer bed to chair (which pt desperately wanted to get into the chair). Son present throughout and very supportive, encouraging.    Recommendations for follow up therapy are one component of a multi-disciplinary discharge planning process, led by the attending physician.  Recommendations may be updated based on patient status, additional functional criteria and insurance authorization.  Follow Up Recommendations  Can patient physically be transported by private vehicle: No    Assistance Recommended at Discharge Frequent or constant Supervision/Assistance  Patient can return home with the following Two people to help with walking and/or transfers;Assistance with cooking/housework;Assist for transportation;Two people to help with bathing/dressing/bathroom   Equipment Recommendations  None recommended by PT    Recommendations for Other Services       Precautions /  Restrictions Precautions Precautions: Fall Precaution Comments: no brace needed per order, but MD approved soft collar for comfort Required Braces or Orthoses: Cervical Brace Cervical Brace: For comfort;Soft collar Restrictions Weight Bearing Restrictions: No     Mobility  Bed Mobility Overal bed mobility: Needs Assistance Bed Mobility: Rolling, Sidelying to Sit Rolling: Mod assist Sidelying to sit: +2 for physical assistance, Mod assist, HOB elevated       General bed mobility comments: Pt needed assist for all aspects of bed mobility    Transfers Overall transfer level: Needs assistance Equipment used: Ambulation equipment used Transfers: Sit to/from Stand Sit to Stand: +2 physical assistance, From elevated surface, Mod assist           General transfer comment: Able to achieve upright standing with head/neck flexed (required assist to hold head in near neutral with reports of decr pain in this position--collar does not quite provide enough extension) Transfer via Lift Equipment: Stedy  Ambulation/Gait               General Gait Details: unable to step feet in standing   Stairs             Wheelchair Mobility    Modified Rankin (Stroke Patients Only)       Balance Overall balance assessment: Needs assistance Sitting-balance support: Feet supported Sitting balance-Leahy Scale: Poor Sitting balance - Comments: Posteriorl lean Postural control: Posterior lean Standing balance support: Bilateral upper extremity supported Standing balance-Leahy Scale: Poor Standing balance comment: +2 mod to come to stand and maintain upright                            Cognition Arousal/Alertness: Awake/alert Investment banker, corporate  denied med effects; just been sleepy all day) Behavior During Therapy: WFL for tasks assessed/performed                                   General Comments: history of dementia; not oriented to place, month, situation         Exercises      General Comments General comments (skin integrity, edema, etc.): Son present throughout session and very supportive      Pertinent Vitals/Pain Pain Assessment Pain Assessment: Faces Faces Pain Scale: Hurts worst Pain Location: neck, shoulders Pain Descriptors / Indicators: Aching, Discomfort, Guarding, Grimacing, Operative site guarding, Moaning, Sharp, Stabbing Pain Intervention(s): Limited activity within patient's tolerance, Monitored during session, Repositioned, Patient requesting pain meds-RN notified, Other (comment) (applied collar)    Home Living                          Prior Function            PT Goals (current goals can now be found in the care plan section) Acute Rehab PT Goals Patient Stated Goal: get out of this bed Time For Goal Achievement: 02/16/23 Potential to Achieve Goals: Fair Progress towards PT goals: Progressing toward goals    Frequency    Min 3X/week      PT Plan Current plan remains appropriate    Co-evaluation PT/OT/SLP Co-Evaluation/Treatment: Yes Reason for Co-Treatment: To address functional/ADL transfers;For patient/therapist safety PT goals addressed during session: Mobility/safety with mobility;Balance        AM-PAC PT "6 Clicks" Mobility   Outcome Measure  Help needed turning from your back to your side while in a flat bed without using bedrails?: A Lot Help needed moving from lying on your back to sitting on the side of a flat bed without using bedrails?: Total Help needed moving to and from a bed to a chair (including a wheelchair)?: Total Help needed standing up from a chair using your arms (e.g., wheelchair or bedside chair)?: Total Help needed to walk in hospital room?: Total Help needed climbing 3-5 steps with a railing? : Total 6 Click Score: 7    End of Session Equipment Utilized During Treatment: Cervical collar Activity Tolerance: Patient limited by pain Patient left: with call  bell/phone within reach;in chair;with chair alarm set;with family/visitor present Nurse Communication: Mobility status;Need for lift equipment PT Visit Diagnosis: Other abnormalities of gait and mobility (R26.89);Muscle weakness (generalized) (M62.81);Pain Pain - Right/Left:  (both) Pain - part of body:  (neck)     Time: [id number] PT Time Calculation (min) (ACUTE ONLY): 27 min  Charges:  $Therapeutic Activity: 8-22 mins                      Arby Barrette, PT Acute Rehabilitation Services  Office [phone]    Rexanne Mano 02/09/2023, 11:30 AM

## 2023-02-10 DIAGNOSIS — N1832 Chronic kidney disease, stage 3b: Secondary | ICD-10-CM | POA: Diagnosis not present

## 2023-02-10 DIAGNOSIS — I10 Essential (primary) hypertension: Secondary | ICD-10-CM | POA: Diagnosis not present

## 2023-02-10 DIAGNOSIS — E1159 Type 2 diabetes mellitus with other circulatory complications: Secondary | ICD-10-CM | POA: Diagnosis not present

## 2023-02-10 DIAGNOSIS — Z8673 Personal history of transient ischemic attack (TIA), and cerebral infarction without residual deficits: Secondary | ICD-10-CM | POA: Diagnosis not present

## 2023-02-10 LAB — RENAL FUNCTION PANEL
Albumin: 2.5 g/dL — ABNORMAL LOW (ref 3.5–5.0)
Anion gap: 11 (ref 5–15)
BUN: 19 mg/dL (ref 8–23)
CO2: 22 mmol/L (ref 22–32)
Calcium: 8.6 mg/dL — ABNORMAL LOW (ref 8.9–10.3)
Chloride: 97 mmol/L — ABNORMAL LOW (ref 98–111)
Creatinine, Ser: 1.71 mg/dL — ABNORMAL HIGH (ref 0.61–1.24)
GFR, Estimated: 39 mL/min — ABNORMAL LOW (ref >60)
Glucose, Bld: 128 mg/dL — ABNORMAL HIGH (ref 70–99)
Phosphorus: 4 mg/dL (ref 2.5–4.6)
Potassium: 3.8 mmol/L (ref 3.5–5.1)
Sodium: 130 mmol/L — ABNORMAL LOW (ref 135–145)

## 2023-02-10 LAB — GLUCOSE, CAPILLARY
Glucose-Capillary: 132 mg/dL — ABNORMAL HIGH (ref 70–99)
Glucose-Capillary: 148 mg/dL — ABNORMAL HIGH (ref 70–99)

## 2023-02-10 MED ORDER — SODIUM CHLORIDE 1 G PO TABS: 1.0000 g | ORAL_TABLET | Freq: Two times a day (BID) | ORAL | Status: AC

## 2023-02-10 MED ORDER — LIDOCAINE 5 % EX PTCH: 1.0000 | MEDICATED_PATCH | CUTANEOUS | 0 refills | Status: DC

## 2023-02-10 MED ORDER — FUROSEMIDE 20 MG PO TABS: 20.0000 mg | ORAL_TABLET | Freq: Every day | ORAL | Status: DC

## 2023-02-10 MED ORDER — SODIUM CHLORIDE 1 G PO TABS: 1.0000 g | ORAL_TABLET | Freq: Two times a day (BID) | ORAL | Status: DC

## 2023-02-10 MED ORDER — CLOPIDOGREL BISULFATE 75 MG PO TABS: 75.0000 mg | ORAL_TABLET | Freq: Every day | ORAL | Status: DC

## 2023-02-10 NOTE — TOC Progression Note (Signed)
Transition of Care [facility]) - Progression Note    Patient Details  Name: Duane [name] MRN: [id number] Date of Birth: [birth year]  Transition of Care [facility] CM/SW [place], [place] Phone Number: 02/10/2023, 12:21 PM  Clinical Narrative:     Patient authorization is good thru today if patient arrives before midnight, however, SNF requested CSW  submit another authorization request since tomorrow is a holiday for some agencies. Josem Kaufmann was submitted and is pending # [id number]  Expected Discharge Plan: Skilled Nursing Facility Barriers to Discharge: Barriers Resolved  Expected Discharge Plan and Services In-house Referral: Clinical Social Work   Post Acute Care Choice: IP Rehab, [place] Living arrangements for the past 2 months: [place] Expected Discharge Date: 02/10/23                                     Social Determinants of Health (SDOH) Interventions SDOH Screenings   Food Insecurity: No Food Insecurity (02/03/2023)  Housing: Low Risk  (02/03/2023)  Transportation Needs: No Transportation Needs (02/03/2023)  Utilities: Not At Risk (02/03/2023)  Tobacco Use: Low Risk  (02/02/2023)    Readmission Risk Interventions     No data to display

## 2023-02-10 NOTE — Progress Notes (Signed)
Neurosurgery Service Progress Note  Subjective: No acute events overnight, no new complaints today  Objective: Vitals:   02/09/23 1915 02/09/23 2300 02/10/23 0337 02/10/23 0732  BP: (!) 135/56 (!) 141/56 (!) 145/65 (!) 146/59  Pulse: 71 80 86 64  Resp: 18 16 18 18   Temp: 97.6 F (36.4 C) 97.9 F (36.6 C) 98 F (36.7 C) 97.6 F (36.4 C)  TempSrc: Oral Oral Oral Oral  SpO2: 95% 98% 98% 98%  Weight:      Height:        Physical Exam: Ox1, strength 4 to 4+/5 in BUE, 4+/5 in BLE, incision c/d/I  Assessment & Plan: 88 y.o. man s/p fracture stabilization, evac of epidural hematoma, recovering well.  -hold plavix until POD10 (3/29), hold eliquis for now - can discuss risks / benefits at follow up -medicine recs -discharge to SNF today  Judith Part  02/10/23 10:18 AM

## 2023-02-10 NOTE — Progress Notes (Signed)
Patient ordered for discharge back to [facility]. All discharge instructions sent via [facility] transport. Report called and given to [employer]. All belongings returned to patient/patient's family. PIV removed. Patient left unit at this time in NAD.

## 2023-02-10 NOTE — Progress Notes (Signed)
PROGRESS NOTE    Duane [name]  [id number] DOB: 12-May-1935 DOA: 02/01/2023 PCP: Javier Glazier, MD   Assessment and Plan:  AKI on CKD stage IIIb Baseline creatinine of about 1.6-1.8. Peak creatinine this admission of 2.16. Toradol discontinued. Complicated by evidence of bladder outlet obstruction requiring the placement of a temporary indwelling catheter. Creatinine improved to baseline.  Hyponatremia Euvolemic with true hypotonic hyponatremia noted by reduced osmolality. Patient managed with fluid restriction and salt tablets with continued worsening sodium to a low of 125. Nephrology was consulted and started low-dose Lasix with improvement of sodium. -Management per nephrology  Bladder outlet obstruction Patient required foley catheter placement on 02/06/23. Urology recommending if chronic catheter is required, to consider suprapubic tube to avoid risk of erosion to patient's penile prosthesis.  Paroxysmal atrial fibrillation Currently in atrial fibrillation, managed on metoprolol and Eliquis. Rate controlled. Eliquis held secondary to surgery. -Continue metoprolol -Resume Eliquis per primary  CAD Hyperlipidemia -Continue fenofibrate  History of CVA Patient is on Plavix as an outpatient which is held secondary to surgery. -Resume Plavix per priamry  Primary hypertension -Continue amlodipine and metoprolol  Diabetes mellitus type 2 Controlled for age, as most recent hemoglobin A1C is 7.5%. patient is managed on metformin, Januvia, Levemir and Humalog as an outpatient.  GERD -Continue Protonix  Cervical spine fracture with epidural hematoma -Per primary   Discharge recommendations: Primary hypertension Continue outpatient regimen (amlodipine, metoprolol) Diabetes mellitus type 2 Continue outpatient regimen (Metformin, Januvia, Levemir 5 units qHS, Humalog sliding scale) CAD/CVA/Hyperlipidemia Eliquis/Plavix per primary, otherwise, continue outpatient  regimen (fenofibrate, omega 3 fatty acids) Bladder outlet obstruction Continue Flomax and foley catheter on discharge Urology recommending consideration of suprapubic catheter to avoid erosion to penile prosthesis   Hyponatremia Per nephrology recommendations CKD stage IIIb Per nephrology recommendations  DVT prophylaxis: Per Primary Code Status:   Code Status: Full Code Family Communication: None at bedside Disposition Plan: Per Primary.  Subjective: No issues noted from overnight events.  Objective: BP (!) 145/55 (BP Location: Right Arm)   Pulse 67   Temp 97.6 F (36.4 C) (Oral)   Resp 16   Ht 5\' 11"  (1.803 m)   Wt 94.5 kg   SpO2 98%   BMI 29.06 kg/m   Examination:  General: Well appearing, no distress   Data Reviewed: I have personally reviewed following labs and imaging studies  CBC Lab Results  Component Value Date   WBC 7.9 02/08/2023   RBC 3.39 (L) 02/08/2023   HGB 8.6 (L) 02/08/2023   HCT 26.9 (L) 02/08/2023   MCV 79.4 (L) 02/08/2023   MCH 25.4 (L) 02/08/2023   PLT 296 02/08/2023   MCHC 32.0 02/08/2023   RDW 15.7 (H) 02/08/2023   LYMPHSABS 3.7 02/01/2023   MONOABS 1.1 (H) 02/01/2023   EOSABS 0.3 02/01/2023   BASOSABS 0.1 99991111     Last metabolic panel Lab Results  Component Value Date   NA 130 (L) 02/10/2023   K 3.8 02/10/2023   CL 97 (L) 02/10/2023   CO2 22 02/10/2023   BUN 19 02/10/2023   CREATININE 1.71 (H) 02/10/2023   GLUCOSE 128 (H) 02/10/2023   GFRNONAA 39 (L) 02/10/2023   GFRAA >60 11/04/2019   CALCIUM 8.6 (L) 02/10/2023   PHOS 4.0 02/10/2023   PROT 6.3 (L) 02/08/2023   ALBUMIN 2.5 (L) 02/10/2023   BILITOT 0.8 02/08/2023   ALKPHOS 88 02/08/2023   AST 25 02/08/2023   ALT 13 02/08/2023   ANIONGAP 11 02/10/2023  GFR: Estimated Creatinine Clearance: 36.4 mL/min (A) (by C-G formula based on SCr of 1.71 mg/dL (H)).  Recent Results (from the past 240 hour(s))  Surgical PCR screen     Status: None   Collection Time:  02/02/23  3:57 AM   Specimen: Nasal Mucosa; Nasal Swab  Result Value Ref Range Status   MRSA, PCR NEGATIVE NEGATIVE Final   Staphylococcus aureus NEGATIVE NEGATIVE Final    Comment: (NOTE) The Xpert SA Assay (FDA approved for NASAL specimens in patients 86 years of age and older), is one component of a comprehensive surveillance program. It is not intended to diagnose infection nor to guide or monitor treatment. Performed at [facility], [place] [address], [place], [place] [address]       Radiology Studies: No results found.    LOS: 9 days    Cordelia Poche, MD [facility] 02/10/2023, 11:38 AM   If 7PM-7AM, please contact night-coverage [email or web address]

## 2023-02-10 NOTE — TOC Transition Note (Signed)
Transition of Care [facility]) - CM/SW Discharge Note   Patient Details  Name: Duane [name] MRN: [id number] Date of Birth: February 13, 1935  [facility] CM/SW Contact:  Vinie Sill, LCSW Phone Number: 02/10/2023, 11:03 AM   Clinical Narrative:     Patient will Discharge to: [place] Discharge Date: 02/10/2023 Family Notified: son Transport [id number]  Per MD patient is ready for discharge. RN, patient, and facility notified of discharge. Discharge Summary sent to facility. RN given number for [phone]. Ambulance transport requested for patient.   Clinical Social Worker signing off.  Thurmond Butts, MSW, LCSW Clinical Social Worker     Final next level of care: Skilled Nursing Facility Barriers to Discharge: Barriers Resolved   Patient Goals and CMS Choice CMS Medicare.gov Compare Post Acute Care list provided to:: Patient Represenative (must comment) Choice offered to / list presented to : Adult Children  Discharge Placement                Patient chooses bed at:  [facility]) Patient to be transferred to facility by: [place] Name of family member notified: son Patient and family notified of of transfer: 02/10/23  Discharge Plan and Services Additional resources added to the After Visit Summary for   In-house Referral: Clinical Social Work   Post Acute Care Choice: IP Rehab, [place]                               Social Determinants of Health (SDOH) Interventions SDOH Screenings   Food Insecurity: No Food Insecurity (02/03/2023)  Housing: Low Risk  (02/03/2023)  Transportation Needs: No Transportation Needs (02/03/2023)  Utilities: Not At Risk (02/03/2023)  Tobacco Use: Low Risk  (02/02/2023)     Readmission Risk Interventions     No data to display

## 2023-02-10 NOTE — Care Management Important Message (Signed)
Important Message  Patient Details  Name: Duane [name] MRN: [id number] Date of Birth: [birth year]   Medicare Important Message Given:  Yes     [name] 02/10/2023, 3:45 PM

## 2023-09-06 ENCOUNTER — Emergency Department (HOSPITAL_COMMUNITY): Payer: Medicare Other

## 2023-09-06 ENCOUNTER — Inpatient Hospital Stay (HOSPITAL_COMMUNITY): Payer: Medicare Other

## 2023-09-06 ENCOUNTER — Inpatient Hospital Stay (HOSPITAL_COMMUNITY)
Admission: EM | Admit: 2023-09-06 | Discharge: 2023-09-10 | DRG: 064 | Disposition: A | Discharge status: Skilled Nursing Facility | Payer: Medicare Other | Attending: Student in an Organized Health Care Education/Training Program | Admitting: Student in an Organized Health Care Education/Training Program

## 2023-09-06 ENCOUNTER — Other Ambulatory Visit: Payer: Self-pay

## 2023-09-06 ENCOUNTER — Encounter (HOSPITAL_COMMUNITY): Payer: Self-pay

## 2023-09-06 DIAGNOSIS — Z888 Allergy status to other drugs, medicaments and biological substances status: Secondary | ICD-10-CM

## 2023-09-06 DIAGNOSIS — I129 Hypertensive chronic kidney disease with stage 1 through stage 4 chronic kidney disease, or unspecified chronic kidney disease: Secondary | ICD-10-CM | POA: Diagnosis present

## 2023-09-06 DIAGNOSIS — I639 Cerebral infarction, unspecified: Secondary | ICD-10-CM | POA: Diagnosis not present

## 2023-09-06 DIAGNOSIS — Z955 Presence of coronary angioplasty implant and graft: Secondary | ICD-10-CM

## 2023-09-06 DIAGNOSIS — N400 Enlarged prostate without lower urinary tract symptoms: Secondary | ICD-10-CM | POA: Diagnosis present

## 2023-09-06 DIAGNOSIS — Z91041 Radiographic dye allergy status: Secondary | ICD-10-CM

## 2023-09-06 DIAGNOSIS — N3000 Acute cystitis without hematuria: Secondary | ICD-10-CM | POA: Diagnosis present

## 2023-09-06 DIAGNOSIS — G4733 Obstructive sleep apnea (adult) (pediatric): Secondary | ICD-10-CM | POA: Diagnosis present

## 2023-09-06 DIAGNOSIS — Z85819 Personal history of malignant neoplasm of unspecified site of lip, oral cavity, and pharynx: Secondary | ICD-10-CM

## 2023-09-06 DIAGNOSIS — I6522 Occlusion and stenosis of left carotid artery: Secondary | ICD-10-CM | POA: Diagnosis present

## 2023-09-06 DIAGNOSIS — I63232 Cerebral infarction due to unspecified occlusion or stenosis of left carotid arteries: Principal | ICD-10-CM | POA: Diagnosis present

## 2023-09-06 DIAGNOSIS — E119 Type 2 diabetes mellitus without complications: Secondary | ICD-10-CM

## 2023-09-06 DIAGNOSIS — I69391 Dysphagia following cerebral infarction: Secondary | ICD-10-CM

## 2023-09-06 DIAGNOSIS — F01A3 Vascular dementia, mild, with mood disturbance: Secondary | ICD-10-CM | POA: Diagnosis present

## 2023-09-06 DIAGNOSIS — Z7984 Long term (current) use of oral hypoglycemic drugs: Secondary | ICD-10-CM

## 2023-09-06 DIAGNOSIS — N390 Urinary tract infection, site not specified: Secondary | ICD-10-CM | POA: Diagnosis present

## 2023-09-06 DIAGNOSIS — R471 Dysarthria and anarthria: Secondary | ICD-10-CM | POA: Diagnosis present

## 2023-09-06 DIAGNOSIS — G9341 Metabolic encephalopathy: Secondary | ICD-10-CM | POA: Diagnosis present

## 2023-09-06 DIAGNOSIS — Z91048 Other nonmedicinal substance allergy status: Secondary | ICD-10-CM

## 2023-09-06 DIAGNOSIS — Z7902 Long term (current) use of antithrombotics/antiplatelets: Secondary | ICD-10-CM

## 2023-09-06 DIAGNOSIS — E1165 Type 2 diabetes mellitus with hyperglycemia: Secondary | ICD-10-CM | POA: Diagnosis present

## 2023-09-06 DIAGNOSIS — R296 Repeated falls: Secondary | ICD-10-CM | POA: Diagnosis present

## 2023-09-06 DIAGNOSIS — E1122 Type 2 diabetes mellitus with diabetic chronic kidney disease: Secondary | ICD-10-CM | POA: Diagnosis present

## 2023-09-06 DIAGNOSIS — N183 Chronic kidney disease, stage 3 unspecified: Secondary | ICD-10-CM | POA: Diagnosis present

## 2023-09-06 DIAGNOSIS — N1832 Chronic kidney disease, stage 3b: Secondary | ICD-10-CM | POA: Diagnosis present

## 2023-09-06 DIAGNOSIS — D649 Anemia, unspecified: Secondary | ICD-10-CM | POA: Diagnosis present

## 2023-09-06 DIAGNOSIS — Z7901 Long term (current) use of anticoagulants: Secondary | ICD-10-CM

## 2023-09-06 DIAGNOSIS — D72829 Elevated white blood cell count, unspecified: Secondary | ICD-10-CM | POA: Diagnosis present

## 2023-09-06 DIAGNOSIS — Z881 Allergy status to other antibiotic agents status: Secondary | ICD-10-CM

## 2023-09-06 DIAGNOSIS — Z781 Physical restraint status: Secondary | ICD-10-CM

## 2023-09-06 DIAGNOSIS — I252 Old myocardial infarction: Secondary | ICD-10-CM

## 2023-09-06 DIAGNOSIS — E1140 Type 2 diabetes mellitus with diabetic neuropathy, unspecified: Secondary | ICD-10-CM | POA: Diagnosis present

## 2023-09-06 DIAGNOSIS — I63422 Cerebral infarction due to embolism of left anterior cerebral artery: Secondary | ICD-10-CM | POA: Diagnosis present

## 2023-09-06 DIAGNOSIS — Z8249 Family history of ischemic heart disease and other diseases of the circulatory system: Secondary | ICD-10-CM

## 2023-09-06 DIAGNOSIS — I2510 Atherosclerotic heart disease of native coronary artery without angina pectoris: Secondary | ICD-10-CM | POA: Diagnosis present

## 2023-09-06 DIAGNOSIS — G8929 Other chronic pain: Secondary | ICD-10-CM | POA: Diagnosis present

## 2023-09-06 DIAGNOSIS — F01A4 Vascular dementia, mild, with anxiety: Secondary | ICD-10-CM | POA: Diagnosis present

## 2023-09-06 DIAGNOSIS — I480 Paroxysmal atrial fibrillation: Secondary | ICD-10-CM | POA: Diagnosis present

## 2023-09-06 DIAGNOSIS — R2681 Unsteadiness on feet: Secondary | ICD-10-CM | POA: Diagnosis present

## 2023-09-06 DIAGNOSIS — Z794 Long term (current) use of insulin: Secondary | ICD-10-CM

## 2023-09-06 DIAGNOSIS — G4710 Hypersomnia, unspecified: Principal | ICD-10-CM

## 2023-09-06 DIAGNOSIS — Z79899 Other long term (current) drug therapy: Secondary | ICD-10-CM

## 2023-09-06 DIAGNOSIS — R410 Disorientation, unspecified: Secondary | ICD-10-CM

## 2023-09-06 DIAGNOSIS — E876 Hypokalemia: Secondary | ICD-10-CM | POA: Diagnosis not present

## 2023-09-06 DIAGNOSIS — R29703 NIHSS score 3: Secondary | ICD-10-CM | POA: Diagnosis present

## 2023-09-06 DIAGNOSIS — E785 Hyperlipidemia, unspecified: Secondary | ICD-10-CM | POA: Diagnosis present

## 2023-09-06 DIAGNOSIS — M542 Cervicalgia: Secondary | ICD-10-CM | POA: Diagnosis present

## 2023-09-06 LAB — URINALYSIS, W/ REFLEX TO CULTURE (INFECTION SUSPECTED)
Bilirubin Urine: NEGATIVE
Glucose, UA: NEGATIVE mg/dL
Hgb urine dipstick: NEGATIVE
Ketones, ur: NEGATIVE mg/dL
Nitrite: POSITIVE — AB
Protein, ur: NEGATIVE mg/dL
Specific Gravity, Urine: 1.01 (ref 1.005–1.030)
pH: 6 (ref 5.0–8.0)

## 2023-09-06 LAB — CBC
HCT: 34 % — ABNORMAL LOW (ref 39.0–52.0)
Hemoglobin: 10.8 g/dL — ABNORMAL LOW (ref 13.0–17.0)
MCH: 27.4 pg (ref 26.0–34.0)
MCHC: 31.8 g/dL (ref 30.0–36.0)
MCV: 86.3 fL (ref 80.0–100.0)
Platelets: 222 10*3/uL (ref 150–400)
RBC: 3.94 MIL/uL — ABNORMAL LOW (ref 4.22–5.81)
RDW: 15.2 % (ref 11.5–15.5)
WBC: 10.7 10*3/uL — ABNORMAL HIGH (ref 4.0–10.5)
nRBC: 0 % (ref 0.0–0.2)

## 2023-09-06 LAB — COMPREHENSIVE METABOLIC PANEL
ALT: 9 U/L (ref 0–44)
AST: 20 U/L (ref 15–41)
Albumin: 3.5 g/dL (ref 3.5–5.0)
Alkaline Phosphatase: 67 U/L (ref 38–126)
Anion gap: 13 (ref 5–15)
BUN: 22 mg/dL (ref 8–23)
CO2: 21 mmol/L — ABNORMAL LOW (ref 22–32)
Calcium: 9.4 mg/dL (ref 8.9–10.3)
Chloride: 103 mmol/L (ref 98–111)
Creatinine, Ser: 1.83 mg/dL — ABNORMAL HIGH (ref 0.61–1.24)
GFR, Estimated: 35 mL/min — ABNORMAL LOW (ref >60)
Glucose, Bld: 167 mg/dL — ABNORMAL HIGH (ref 70–99)
Potassium: 3.8 mmol/L (ref 3.5–5.1)
Sodium: 137 mmol/L (ref 135–145)
Total Bilirubin: 0.7 mg/dL (ref 0.3–1.2)
Total Protein: 7.8 g/dL (ref 6.5–8.1)

## 2023-09-06 LAB — CBG MONITORING, ED: Glucose-Capillary: 167 mg/dL — ABNORMAL HIGH (ref 70–99)

## 2023-09-06 LAB — ETHANOL: Alcohol, Ethyl (B): <10 mg/dL (ref <10)

## 2023-09-06 LAB — AMMONIA: Ammonia: 17 umol/L (ref 9–35)

## 2023-09-06 LAB — TSH: TSH: 1.766 u[IU]/mL (ref 0.350–4.500)

## 2023-09-06 MED ORDER — LORAZEPAM 1 MG PO TABS
1.0000 mg | ORAL_TABLET | Freq: Three times a day (TID) | ORAL | Status: DC | PRN
  Administered 2023-09-06: 1 mg via ORAL
  Filled 2023-09-06: qty 1

## 2023-09-06 MED ORDER — CEPHALEXIN 500 MG PO CAPS: 500.0000 mg | ORAL_CAPSULE | Freq: Two times a day (BID) | ORAL | 0 refills | Status: DC

## 2023-09-06 MED ORDER — ACETAMINOPHEN 325 MG PO TABS
650.0000 mg | ORAL_TABLET | Freq: Four times a day (QID) | ORAL | Status: DC | PRN
  Administered 2023-09-07 – 2023-09-10 (×3): 650 mg via ORAL
  Filled 2023-09-06 (×3): qty 2

## 2023-09-06 MED ORDER — ESCITALOPRAM OXALATE 10 MG PO TABS
10.0000 mg | ORAL_TABLET | Freq: Every day | ORAL | Status: DC
  Administered 2023-09-07 – 2023-09-10 (×4): 10 mg via ORAL
  Filled 2023-09-06 (×4): qty 1

## 2023-09-06 MED ORDER — DOCUSATE SODIUM 100 MG PO CAPS
100.0000 mg | ORAL_CAPSULE | Freq: Two times a day (BID) | ORAL | Status: DC
  Administered 2023-09-06 – 2023-09-10 (×8): 100 mg via ORAL
  Filled 2023-09-06 (×8): qty 1

## 2023-09-06 MED ORDER — TAMSULOSIN HCL 0.4 MG PO CAPS
0.4000 mg | ORAL_CAPSULE | Freq: Every day | ORAL | Status: DC
  Administered 2023-09-07 – 2023-09-09 (×3): 0.4 mg via ORAL
  Filled 2023-09-06 (×3): qty 1

## 2023-09-06 MED ORDER — POLYETHYLENE GLYCOL 3350 17 G PO PACK: 17.0000 g | PACK | Freq: Every day | ORAL | Status: DC | PRN

## 2023-09-06 MED ORDER — METOPROLOL SUCCINATE ER 100 MG PO TB24
100.0000 mg | ORAL_TABLET | Freq: Every day | ORAL | Status: DC
  Administered 2023-09-06 – 2023-09-10 (×5): 100 mg via ORAL
  Filled 2023-09-06 (×5): qty 1

## 2023-09-06 MED ORDER — CLOPIDOGREL BISULFATE 75 MG PO TABS
75.0000 mg | ORAL_TABLET | Freq: Every day | ORAL | Status: DC
  Administered 2023-09-07 – 2023-09-10 (×4): 75 mg via ORAL
  Filled 2023-09-06 (×4): qty 1

## 2023-09-06 MED ORDER — AMLODIPINE BESYLATE 5 MG PO TABS
5.0000 mg | ORAL_TABLET | Freq: Every day | ORAL | Status: DC
  Administered 2023-09-07 – 2023-09-10 (×4): 5 mg via ORAL
  Filled 2023-09-06 (×4): qty 1

## 2023-09-06 MED ORDER — MEMANTINE HCL 10 MG PO TABS
5.0000 mg | ORAL_TABLET | Freq: Every day | ORAL | Status: DC
  Administered 2023-09-06 – 2023-09-09 (×4): 5 mg via ORAL
  Filled 2023-09-06 (×4): qty 1

## 2023-09-06 MED ORDER — ENOXAPARIN SODIUM 40 MG/0.4ML IJ SOSY: 40.0000 mg | PREFILLED_SYRINGE | INTRAMUSCULAR | Status: DC

## 2023-09-06 MED ORDER — CEPHALEXIN 500 MG PO CAPS
500.0000 mg | ORAL_CAPSULE | Freq: Two times a day (BID) | ORAL | Status: DC
  Administered 2023-09-06 – 2023-09-10 (×8): 500 mg via ORAL
  Filled 2023-09-06 (×8): qty 1

## 2023-09-06 MED ORDER — CEPHALEXIN 250 MG PO CAPS
500.0000 mg | ORAL_CAPSULE | Freq: Once | ORAL | Status: AC
  Administered 2023-09-06: 500 mg via ORAL
  Filled 2023-09-06: qty 2

## 2023-09-06 MED ORDER — APIXABAN 2.5 MG PO TABS
2.5000 mg | ORAL_TABLET | Freq: Two times a day (BID) | ORAL | Status: DC
  Administered 2023-09-06 – 2023-09-10 (×8): 2.5 mg via ORAL
  Filled 2023-09-06 (×8): qty 1

## 2023-09-06 NOTE — ED Notes (Signed)
Patient transported to MRI 

## 2023-09-06 NOTE — ED Notes (Signed)
Transport requested for patient.  

## 2023-09-06 NOTE — ED Notes (Signed)
Pt incontinent of urine into brief. Pericare provided and placed in new brief with fresh gown and linens. Pt easily agitated with care.

## 2023-09-06 NOTE — Consult Note (Signed)
Neurology Consultation  Reason for Consult: Stroke on MRI Referring Physician: Dr. Rubin Payor  CC: Slurred speech  History is obtained from: Patient's family at bedside, chart review, unable to obtain from patient as patient is a poor historian  HPI: Duane PLESE is a 88 y.o. 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  Patient does not recall any falls overnight.  Family reports that they noticed slurred speech last Tuesday when having lunch with the patient but felt this was due to his increased Seroquel dosing from 25 mg to 50 mg that week.  Family also notes that patient has had generalized weakness for 1 week.  It was unclear if patient was taking Eliquis at his facility for clopidogrel for both.  At baseline, patient is not oriented to time, lives in a nursing facility, and uses a wheelchair for mobilization mostly.  Patient is able to transfer himself and walk from his bed to his bathroom with a walker typically but he has had increased trouble with ambulation this week.   ROS: A complete ROS was performed and is negative except as noted in the HPI.   Past Medical History:  Diagnosis Date   A-fib (HCC)    Anemia    Angina pectoris (HCC)    Arthritis    Carpal tunnel syndrome    Cobalamin deficiency    Coronary artery disease    Diabetes mellitus without complication (HCC)    Eczema    Hyperlipemia    Hypertension    Kidney disease, chronic, stage III (moderate, EGFR 30-59 ml/min) (HCC)    Lung infiltrate    Myocardial infarct, old    Prostatic hypertrophy    Sleep  apnea, obstructive    Squamous cell cancer of lip    Past Surgical History:  Procedure Laterality Date   CORONARY ANGIOPLASTY WITH STENT PLACEMENT     POSTERIOR CERVICAL FUSION/FORAMINOTOMY N/A 02/01/2023   Procedure: CERVICAL FIVE - THORACIC ONE LAMINECTOMY, AND INSTRUMENTED FUSION;  Surgeon: Jadene Pierini, MD;  Location: [facility] OR;  Service: Neurosurgery;  Laterality: N/A;   Family History  Problem Relation Age of Onset   Hypertension Other    Social History:   reports that he has never smoked. He has never used smokeless tobacco. He reports that he does not drink alcohol. No history on file for drug use.  Medications No current facility-administered medications for this encounter.  Current Outpatient Medications:    Acetaminophen (TYLENOL 8 HOUR PO), Take 1 tablet by mouth in the morning, at noon, and at bedtime., Disp: , Rfl:    amLODipine (NORVASC) 5 MG tablet, Take 5 mg by mouth in the morning and at bedtime., Disp: , Rfl:    Calcium Carb-Cholecalciferol (CALCIUM + VITAMIN D3) 500-5 MG-MCG TABS, Take 1 tablet by mouth daily., Disp: , Rfl:    cephALEXin (KEFLEX) 500 MG capsule, Take 1 capsule (500 mg total) by mouth 2 (two) times daily for 5 days., Disp: 10 capsule, Rfl: 0   clopidogrel (PLAVIX) 75 MG tablet, Take 1 tablet (75 mg total) by mouth daily., Disp: , Rfl:    cyclobenzaprine (FLEXERIL) 5 MG tablet, Take  5 mg by mouth daily as needed for muscle spasms., Disp: , Rfl:    EPINEPHrine 0.3 mg/0.3 mL IJ SOAJ injection, Inject 0.3 mg into the muscle once as needed (severe allergic reaction)., Disp: , Rfl:    escitalopram (LEXAPRO) 5 MG tablet, Take 10 mg by mouth daily., Disp: , Rfl:    hydrochlorothiazide (HYDRODIURIL) 12.5 MG tablet, Take 12.5 mg by mouth daily., Disp: , Rfl:    insulin detemir (LEVEMIR) 100 UNIT/ML injection, Inject 0.05 mLs (5 Units total) into the skin at bedtime. (Patient taking differently: Inject 6 Units into the skin daily in the afternoon.), Disp: ,  Rfl:    LORazepam (ATIVAN) 1 MG tablet, Take 1 mg by mouth every 8 (eight) hours as needed (agitation/behaviors/aggresion)., Disp: , Rfl:    memantine (NAMENDA) 10 MG tablet, Take 10 mg by mouth in the morning., Disp: , Rfl:    memantine (NAMENDA) 5 MG tablet, Take 5 mg by mouth at bedtime., Disp: , Rfl:    Menthol, Topical Analgesic, (BIOFREEZE COOL THE PAIN) 4 % GEL, Apply 1 Application topically in the morning, at noon, and at bedtime., Disp: , Rfl:    Menthol, Topical Analgesic, (BIOFREEZE COOL THE PAIN) 4 % GEL, Apply 1 Application topically every 4 (four) hours as needed (neck and shoulder pain)., Disp: , Rfl:    Menthol, Topical Analgesic, (EUCERIN ITCH RELIEF) 0.1 % LOTN, Apply 1 Application topically in the morning and at bedtime. Apply to lower legs, Disp: , Rfl:    metoprolol succinate (TOPROL-XL) 50 MG 24 hr tablet, Take 100 mg by mouth daily., Disp: , Rfl:    mirtazapine (REMERON) 15 MG tablet, Take 7.5 mg by mouth at bedtime., Disp: , Rfl:    nitroGLYCERIN (NITROSTAT) 0.4 MG SL tablet, Place 0.4 mg under the tongue every 5 (five) minutes as needed for chest pain., Disp: , Rfl:    Omega-3 1400 MG CAPS, Take 1,400 mg by mouth in the morning and at bedtime., Disp: , Rfl:    pantoprazole (PROTONIX) 20 MG tablet, Take 20 mg by mouth daily., Disp: , Rfl:    QUEtiapine (SEROQUEL) 25 MG tablet, Take 50 mg by mouth at bedtime., Disp: , Rfl:    sitaGLIPtin-metformin (JANUMET) 50-1000 MG tablet, Take 1 tablet by mouth daily., Disp: , Rfl:    sodium chloride 1 g tablet, Take 1 g by mouth daily., Disp: , Rfl:    tamsulosin (FLOMAX) 0.4 MG CAPS capsule, Take 1 capsule (0.4 mg total) by mouth daily. (Patient taking differently: Take 0.4 mg by mouth at bedtime.), Disp: 30 capsule, Rfl:   Exam: Current vital signs: BP (!) 152/64 (BP Location: Left Arm)   Pulse 61   Temp 98.1 F (36.7 C) (Oral)   Resp 14   SpO2 99%  Vital signs in last 24 hours: Temp:  [97.6 F (36.4 C)-98.1 F (36.7 C)] 98.1  F (36.7 C) (10/22 1613) Pulse Rate:  [61-67] 61 (10/22 1613) Resp:  [12-18] 14 (10/22 1613) BP: (145-152)/(64-67) 152/64 (10/22 1613) SpO2:  [99 %-100 %] 99 % (10/22 1613)  GENERAL: Awake, alert, in no acute distress.  Psych: Affect appropriate for situation, patient is calm and cooperative with examination Head: Normocephalic and atraumatic, without obvious abnormality EENT: Normal conjunctivae, dry mucous membranes, no OP obstruction LUNGS: Normal respiratory effort. Non-labored breathing on room air CV: Regular rate and rhythm on telemetry ABDOMEN: Soft, non-tender, non-distended Extremities: Warm, well perfused, without obvious deformity  NEURO:  Mental Status: Awake, alert, and oriented to  self and place. Not oriented to time or situation. He is trying to ambulate and insisting that he has to use the restroom on examiner evaluation with incontinence of urine.  He is unable to provide a clear and coherent history of present illness.  Patient states that he does not feel confused more than baseline.  Family at bedside states that on evaluation, patient is at his neurologic baseline with mild slurred speech.  Speech/Language: speech is mildly dysarthric without aphasia.  Patient follows commands without difficulty. Cranial Nerves:  II: PERRL. VFF III, IV, VI: EOMI without gaze preference V: Sensation is intact to light touch and symmetrical to face.  VII: Face is symmetric resting and with movement VIII: Hearing is intact to voice IX, X: Palate elevation is symmetric. Phonation normal.  XI: Normal sternocleidomastoid and trapezius muscle strength XII: Tongue protrudes midline without fasciculations.   Motor: 5/5 strength is all muscle groups without unilateral weakness Tone is normal. Bulk is normal.  Sensation: Intact to light touch bilaterally in all four extremities. No extinction to DSS present.  Coordination: FTN intact bilaterally. HKS intact bilaterally.  Gait:  Deferred  Labs I have reviewed labs in epic and the results pertinent to this consultation are: CBC    Component Value Date/Time   WBC 10.7 (H) 09/06/2023 0943   RBC 3.94 (L) 09/06/2023 0943   HGB 10.8 (L) 09/06/2023 0943   HCT 34.0 (L) 09/06/2023 0943   PLT 222 09/06/2023 0943   MCV 86.3 09/06/2023 0943   MCH 27.4 09/06/2023 0943   MCHC 31.8 09/06/2023 0943   RDW 15.2 09/06/2023 0943   LYMPHSABS 3.7 02/01/2023 1316   MONOABS 1.1 (H) 02/01/2023 1316   EOSABS 0.3 02/01/2023 1316   BASOSABS 0.1 02/01/2023 1316   CMP     Component Value Date/Time   NA 137 09/06/2023 0943   K 3.8 09/06/2023 0943   CL 103 09/06/2023 0943   CO2 21 (L) 09/06/2023 0943   GLUCOSE 167 (H) 09/06/2023 0943   BUN 22 09/06/2023 0943   CREATININE 1.83 (H) 09/06/2023 0943   CALCIUM 9.4 09/06/2023 0943   PROT 7.8 09/06/2023 0943   ALBUMIN 3.5 09/06/2023 0943   AST 20 09/06/2023 0943   ALT 9 09/06/2023 0943   ALKPHOS 67 09/06/2023 0943   BILITOT 0.7 09/06/2023 0943   GFRNONAA 35 (L) 09/06/2023 0943   GFRAA >60 11/04/2019 1614   Lipid Panel     Component Value Date/Time   CHOL 192 05/13/2021 0223   TRIG 619 (H) 05/13/2021 0223   HDL 26 (L) 05/13/2021 0223   CHOLHDL 7.4 05/13/2021 0223   VLDL UNABLE TO CALCULATE IF TRIGLYCERIDE OVER 400 mg/dL 16/08/9603 5409   LDLCALC UNABLE TO CALCULATE IF TRIGLYCERIDE OVER 400 mg/dL 81/19/1478 2956   LDLDIRECT 60.9 05/13/2021 0223   Lab Results  Component Value Date   HGBA1C 7.5 (H) 02/02/2023   Imaging I have reviewed the images obtained:  CT-scan of the brain 10/22: 1. No acute intracranial abnormality. Unchanged moderate to severe chronic small-vessel disease. 2. No acute cervical spine fracture or traumatic listhesis. 3. Interval postoperative changes of C5-C7 laminectomy and posterior spinal fusion. No evidence of hardware complication. 4. Multilevel cervical spondylosis, worst at C4-C5, where there is mild spinal canal stenosis and severe right  and moderate left neural foraminal narrowing.  MRI examination of the brain 10/22: 1. Multiple small acute infarcts in the left corona radiata. 2. Moderate chronic microvascular ischemic disease.  Assessment: 88 year old male with PMHx of  atrial fibrillation, DM2, COPD, HTN, HLD, CKD stage III, remote MI, OSA, C6/7 fracture s/p laminectomy with epidural hematoma s/p evacuation 01/2023 who presented to the ED with increased confusion and slurred speech after sustaining 3 falls overnight per nursing facility.  MRI imaging revealed multiple small acute infarcts in the left corona radiata and neurology was consulted for further evaluation.  Family reports slurred speech since 08/30/2023.  No facility notes available for review, family is unsure patient is taking Eliquis or clopidogrel at facility.   Impression: Small acute left corona radiata infarcts  Recommendations: - HgbA1c, fasting lipid panel -  MRA head and neck - Frequent neuro checks - Echocardiogram - Prophylactic therapy- continue Eliquis as appropriate (unclear if patient taking at SNF per family) - Risk factor modification - Telemetry monitoring - PT consult, OT consult, Speech consult - Stroke team to follow  Pt seen by NP/Neuro and later by MD. Note/plan to be edited by MD as needed.  Lanae Boast, AGAC-NP [facility] Pager: [phone] NEUROHOSPITALIST ADDENDUM Performed a face to face diagnostic evaluation.   I have reviewed the contents of history and physical exam as documented by PA/ARNP/Resident and agree with above documentation.  I have discussed and formulated the above plan as documented. Edits to the note have been made as needed.  Impression/Key exam findings/Plan: will get US carotid duplex to clarify the noted concern for severe L ICA stenosis which may be the potential etiology of his noted scattered L caudate strokes and require intervention. However, will need to carefully weigh in risks  and benefits specially with his advanced age and comorbidities.  Erick Blinks, MD [facility] [phone]   If 7pm to 7am, [name] call on call as listed on AMION.

## 2023-09-06 NOTE — ED Provider Notes (Signed)
[place] EMERGENCY DEPARTMENT AT [facility] Provider Note   CSN: [id number] Arrival date & time: 09/06/23  9562     History  No chief complaint on file.   Duane [name] is a 88 y.o. male.  HPI Patient presents with his son who assists with the history. Patient has dementia level 5 caveat. Patient himself complains of neck pain which she typically has, otherwise he cannot provide details of his illness, but can seemingly answer questions about his current condition appropriately.  He denies any pain, denies focal weakness, denies nausea.  He does describe difficulty with trying to ambulate, unclear onset.  He also describes some difficulty with speech, also unclear onset. According to notes nursing of staff noticed the patient was more sleepy than usual.  Son also notes the patient has some slurring of speech which may be new. Per nursing home notes patient recently had adjustment of his Seroquel dosing, unclear time.    Home Medications Prior to Admission medications   Medication Sig Start Date End Date Taking? Authorizing Provider  Acetaminophen (TYLENOL 8 HOUR PO) Take 1 tablet by mouth in the morning, at noon, and at bedtime.   Yes [provider]  amLODipine (NORVASC) 5 MG tablet Take 5 mg by mouth in the morning and at bedtime.   Yes [provider]  Calcium Carb-Cholecalciferol (CALCIUM + VITAMIN D3) 500-5 MG-MCG TABS Take 1 tablet by mouth daily.   Yes [provider]  cephALEXin (KEFLEX) 500 MG capsule Take 1 capsule (500 mg total) by mouth 2 (two) times daily for 5 days. 09/06/23 09/11/23 Yes Gerhard Munch, MD  clopidogrel (PLAVIX) 75 MG tablet Take 1 tablet (75 mg total) by mouth daily. 02/11/23  Yes Jadene Pierini, MD  cyclobenzaprine (FLEXERIL) 5 MG tablet Take 5 mg by mouth daily as needed for muscle spasms.   Yes [provider]  EPINEPHrine 0.3 mg/0.3 mL IJ SOAJ injection Inject 0.3 mg into the muscle once as  needed (severe allergic reaction).   Yes [provider]  escitalopram (LEXAPRO) 5 MG tablet Take 10 mg by mouth daily.   Yes [provider]  hydrochlorothiazide (HYDRODIURIL) 12.5 MG tablet Take 12.5 mg by mouth daily.   Yes [provider]  insulin detemir (LEVEMIR) 100 UNIT/ML injection Inject 0.05 mLs (5 Units total) into the skin at bedtime. Patient taking differently: Inject 6 Units into the skin daily in the afternoon. 05/15/21  Yes Hongalgi, Maximino Greenland, MD  LORazepam (ATIVAN) 1 MG tablet Take 1 mg by mouth every 8 (eight) hours as needed (agitation/behaviors/aggresion).   Yes [provider]  memantine (NAMENDA) 10 MG tablet Take 10 mg by mouth in the morning.   Yes [provider]  memantine (NAMENDA) 5 MG tablet Take 5 mg by mouth at bedtime.   Yes [provider]  Menthol, Topical Analgesic, (BIOFREEZE COOL THE PAIN) 4 % GEL Apply 1 Application topically in the morning, at noon, and at bedtime.   Yes [provider]  Menthol, Topical Analgesic, (BIOFREEZE COOL THE PAIN) 4 % GEL Apply 1 Application topically every 4 (four) hours as needed (neck and shoulder pain).   Yes [provider]  Menthol, Topical Analgesic, (EUCERIN ITCH RELIEF) 0.1 % LOTN Apply 1 Application topically in the morning and at bedtime. Apply to lower legs   Yes [provider]  metoprolol succinate (TOPROL-XL) 50 MG 24 hr tablet Take 100 mg by mouth daily. 10/26/22  Yes [provider]  mirtazapine (REMERON) 15 MG tablet Take 7.5 mg by mouth at bedtime.   Yes [provider]  nitroGLYCERIN (NITROSTAT) 0.4 MG SL tablet Place 0.4 mg under the tongue every 5 (five) minutes as needed for chest pain.   Yes [provider]  Omega-3 1400 MG CAPS Take 1,400 mg by mouth in the morning and at bedtime.   Yes [provider]  pantoprazole (PROTONIX) 20 MG tablet Take 20 mg by mouth daily.   Yes [provider]   QUEtiapine (SEROQUEL) 25 MG tablet Take 50 mg by mouth at bedtime.   Yes [provider]  sitaGLIPtin-metformin (JANUMET) 50-1000 MG tablet Take 1 tablet by mouth daily.   Yes [provider]  sodium chloride 1 g tablet Take 1 g by mouth daily.   Yes [provider]  tamsulosin (FLOMAX) 0.4 MG CAPS capsule Take 1 capsule (0.4 mg total) by mouth daily. Patient taking differently: Take 0.4 mg by mouth at bedtime. 02/09/23  Yes Narda Bonds, MD      Allergies    Baclofen, Bee venom, Contrast media [iodinated contrast media], Iodine-131, Metrizamide, Metronidazole, Atorvastatin, and Lisinopril    Review of Systems   Review of Systems  Physical Exam Updated Vital Signs BP (!) 146/66   Pulse 62   Temp 97.6 F (36.4 C)   Resp 12   SpO2 100%  Physical Exam Vitals and nursing note reviewed.  Constitutional:      General: He is not in acute distress.    Appearance: He is well-developed.  HENT:     Head: Normocephalic and atraumatic.  Eyes:     Conjunctiva/sclera: Conjunctivae normal.  Cardiovascular:     Rate and Rhythm: Normal rate and regular rhythm.  Pulmonary:     Effort: Pulmonary effort is normal. No respiratory distress.     Breath sounds: No stridor.  Abdominal:     General: There is no distension.  Skin:    General: Skin is warm and dry.  Neurological:     Mental Status: He is alert.     Cranial Nerves: Dysarthria present. No facial asymmetry.     Motor: Atrophy present. No tremor or abnormal muscle tone.  Psychiatric:        Cognition and Memory: Cognition is impaired. Memory is impaired.     ED Results / Procedures / Treatments   Labs (all labs ordered are listed, but only abnormal results are displayed) Labs Reviewed  COMPREHENSIVE METABOLIC PANEL - Abnormal; Notable for the following components:      Result Value   CO2 21 (*)    Glucose, Bld 167 (*)    Creatinine, Ser 1.83 (*)    GFR, Estimated 35 (*)    All other components  within normal limits  CBC - Abnormal; Notable for the following components:   WBC 10.7 (*)    RBC 3.94 (*)    Hemoglobin 10.8 (*)    HCT 34.0 (*)    All other components within normal limits  URINALYSIS, W/ REFLEX TO CULTURE (INFECTION SUSPECTED) - Abnormal; Notable for the following components:   APPearance HAZY (*)    Nitrite POSITIVE (*)    Leukocytes,Ua MODERATE (*)    Bacteria, UA MANY (*)    All other components within normal limits  CBG MONITORING, ED - Abnormal; Notable for the following components:   Glucose-Capillary 167 (*)    All other components within normal limits  URINE CULTURE  AMMONIA  ETHANOL  TSH  EKG EKG Interpretation Date/Time:  Tuesday September 06 2023 09:32:54 EDT Ventricular Rate:  66 PR Interval:  210 QRS Duration:  76 QT Interval:  454 QTC Calculation: 475 R Axis:   -7  Text Interpretation: Sinus rhythm with 1st degree A-V block Inferior infarct , age undetermined Abnormal ECG Confirmed by Gerhard Munch [id number]) on 09/06/2023 9:47:31 AM  Radiology DG Chest Portable 1 View  Result Date: 09/06/2023 CLINICAL DATA:  Weakness. EXAM: PORTABLE CHEST 1 VIEW COMPARISON:  Chest radiograph 02/01/2023. FINDINGS: Low lung volumes accentuate the pulmonary vasculature and cardiomediastinal silhouette. No consolidation or pulmonary edema. No pleural effusion or pneumothorax. IMPRESSION: Low lung volumes without evidence of acute cardiopulmonary disease. Electronically Signed   By: Orvan Falconer M.D.   On: 09/06/2023 11:46   CT Head Wo Contrast  Result Date: 09/06/2023 CLINICAL DATA:  Mental status change, unknown cause; Neck pain, acute, no red flags. EXAM: CT HEAD WITHOUT CONTRAST CT CERVICAL SPINE WITHOUT CONTRAST TECHNIQUE: Multidetector CT imaging of the head and cervical spine was performed following the standard protocol without intravenous contrast. Multiplanar CT image reconstructions of the cervical spine were also generated. RADIATION DOSE  REDUCTION: This exam was performed according to the departmental dose-optimization program which includes automated exposure control, adjustment of the mA and/or kV according to patient size and/or use of iterative reconstruction technique. COMPARISON:  CT head and cervical spine 02/01/2023. FINDINGS: CT HEAD FINDINGS Brain: No acute hemorrhage. Unchanged moderate to severe chronic small-vessel disease. Cortical gray-white differentiation is otherwise preserved. Prominence of the ventricles and sulci within expected range for age. No hydrocephalus or extra-axial collection. No mass effect or midline shift. Vascular: No hyperdense vessel or unexpected calcification. Skull: No calvarial fracture or suspicious bone lesion. Skull base is unremarkable. Sinuses/Orbits: No acute finding. Other: None. CT CERVICAL SPINE FINDINGS Alignment: 3 mm degenerative anterolisthesis of C4 on C5. Skull base and vertebrae: Interval postoperative changes of C5-C7 laminectomy and posterior spinal fusion. Hardware is intact. No associated lucency or fracture. Soft tissues and spinal canal: No prevertebral fluid or swelling. No visible canal hematoma. Disc levels: C2-C3: Acquired fusion. No spinal canal stenosis or neural foraminal narrowing. C3-C4: Left eccentric disc bulge results in mild spinal canal stenosis. Facet arthropathy uncovertebral joint spurring results in moderate bilateral neural foraminal narrowing. C4-C5: Anterolisthesis with uncovered disc results in mild spinal canal stenosis. Right-greater-than-left facet arthropathy and uncovertebral joint spurring results in severe right and moderate left neural foraminal narrowing. C5-C6: Interval laminectomy. No spinal canal stenosis or significant neural foraminal narrowing. C6-C7: Interval laminectomy. No spinal canal stenosis or significant neural foraminal narrowing. C7-T1: Interval laminectomy. No spinal canal stenosis. Left-greater-than-right facet arthropathy and endplate  osteophytes results in moderate left neural foraminal narrowing. Upper chest: No acute findings. Other: Atherosclerotic calcifications of the carotid bulbs. IMPRESSION: 1. No acute intracranial abnormality. Unchanged moderate to severe chronic small-vessel disease. 2. No acute cervical spine fracture or traumatic listhesis. 3. Interval postoperative changes of C5-C7 laminectomy and posterior spinal fusion. No evidence of hardware complication. 4. Multilevel cervical spondylosis, worst at C4-C5, where there is mild spinal canal stenosis and severe right and moderate left neural foraminal narrowing. Electronically Signed   By: Orvan Falconer M.D.   On: 09/06/2023 11:45   CT Cervical Spine Wo Contrast  Result Date: 09/06/2023 CLINICAL DATA:  Mental status change, unknown cause; Neck pain, acute, no red flags. EXAM: CT HEAD WITHOUT CONTRAST CT CERVICAL SPINE WITHOUT CONTRAST TECHNIQUE: Multidetector CT imaging of the head and cervical spine was performed following the  standard protocol without intravenous contrast. Multiplanar CT image reconstructions of the cervical spine were also generated. RADIATION DOSE REDUCTION: This exam was performed according to the departmental dose-optimization program which includes automated exposure control, adjustment of the mA and/or kV according to patient size and/or use of iterative reconstruction technique. COMPARISON:  CT head and cervical spine 02/01/2023. FINDINGS: CT HEAD FINDINGS Brain: No acute hemorrhage. Unchanged moderate to severe chronic small-vessel disease. Cortical gray-white differentiation is otherwise preserved. Prominence of the ventricles and sulci within expected range for age. No hydrocephalus or extra-axial collection. No mass effect or midline shift. Vascular: No hyperdense vessel or unexpected calcification. Skull: No calvarial fracture or suspicious bone lesion. Skull base is unremarkable. Sinuses/Orbits: No acute finding. Other: None. CT CERVICAL  SPINE FINDINGS Alignment: 3 mm degenerative anterolisthesis of C4 on C5. Skull base and vertebrae: Interval postoperative changes of C5-C7 laminectomy and posterior spinal fusion. Hardware is intact. No associated lucency or fracture. Soft tissues and spinal canal: No prevertebral fluid or swelling. No visible canal hematoma. Disc levels: C2-C3: Acquired fusion. No spinal canal stenosis or neural foraminal narrowing. C3-C4: Left eccentric disc bulge results in mild spinal canal stenosis. Facet arthropathy uncovertebral joint spurring results in moderate bilateral neural foraminal narrowing. C4-C5: Anterolisthesis with uncovered disc results in mild spinal canal stenosis. Right-greater-than-left facet arthropathy and uncovertebral joint spurring results in severe right and moderate left neural foraminal narrowing. C5-C6: Interval laminectomy. No spinal canal stenosis or significant neural foraminal narrowing. C6-C7: Interval laminectomy. No spinal canal stenosis or significant neural foraminal narrowing. C7-T1: Interval laminectomy. No spinal canal stenosis. Left-greater-than-right facet arthropathy and endplate osteophytes results in moderate left neural foraminal narrowing. Upper chest: No acute findings. Other: Atherosclerotic calcifications of the carotid bulbs. IMPRESSION: 1. No acute intracranial abnormality. Unchanged moderate to severe chronic small-vessel disease. 2. No acute cervical spine fracture or traumatic listhesis. 3. Interval postoperative changes of C5-C7 laminectomy and posterior spinal fusion. No evidence of hardware complication. 4. Multilevel cervical spondylosis, worst at C4-C5, where there is mild spinal canal stenosis and severe right and moderate left neural foraminal narrowing. Electronically Signed   By: Orvan Falconer M.D.   On: 09/06/2023 11:45    Procedures Procedures    Medications Ordered in ED Medications  cephALEXin (KEFLEX) capsule 500 mg (has no administration in time  range)    ED Course/ Medical Decision Making/ A&P                                 Medical Decision Making Elderly male with dementia presents from nursing facility with staff concerns of hypersomnolence.  On exam the patient is awake and alert, complaining of neck pain which is seemingly chronic.  Patient also has some difficulty with ambulation, as well as possible new speech difficulty, or unclear time of onset. Broad differential including medication effects, electrolyte abnormalities, infection, intracranial mass, stroke considered. Cardiac 70 sinus normal Pulse ox 100% room air normal   Amount and/or Complexity of Data Reviewed Labs: ordered. Radiology: ordered.  Risk Prescription drug management.   3:44 PM Patient accompanied by his daughter as well.  Labs reviewed, thus far mostly unremarkable aside from slight trending in decreased renal function.  No evidence for decompensation.  Patient is found to have urinary tract infection and has started Keflex here.  Patient's head CT reviewed no intracranial abnormality, no cervical spine acute change.  MRI is pending.  Pending MRI results, patient may be appropriate  to return to his nursing facility with findings consistent with urinary tract infection likely contributing to his hypersomnolence. No evidence for bacteremia, sepsis.  MRI pending on signout.         Final Clinical Impression(s) / ED Diagnoses Final diagnoses:  Hypersomnolence  Acute cystitis without hematuria    Rx / DC Orders ED Discharge Orders          Ordered    cephALEXin (KEFLEX) 500 MG capsule  2 times daily        09/06/23 1544              Gerhard Munch, MD 09/06/23 1544
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
## 2023-09-06 NOTE — ED Notes (Signed)
Shift report received, assumed care of patient at this time 

## 2023-09-06 NOTE — H&P (Cosign Needed)
Date: 09/07/2023               Patient Name:  Duane [name] MRN: [id number]  DOB: September 11, 1935 Age / Sex: 88 y.o., male   PCP: Nadara Eaton, MD         Medical Service: Internal Medicine Teaching Service         Attending Physician: Dr. Inez Catalina, MD      First Contact: Dr. Katheran James, DO Pager [phone]    Second Contact: Dr. Marrianne Mood, MD Pager [phone]         After Hours (After 5p/  First Contact Pager: [phone]  weekends / holidays): Second Contact Pager: [phone]   SUBJECTIVE   Chief Complaint: Confusion, falls  History of Present Illness:  [name] is an 88 yo M with PMH of Afib not anticoagulated, CAD with multiple stents, previous CVA, Dementia, CKD3b, T2DM with neuropathy, C6/7 fracture s/p laminectomy with epidural hematoma s/p evacuation 01/2023 presenting to [facility] ED on 10/22 with concern of altered mental status and recent falls.   He lives at [facility] memory care facility and this morning was found to be somnolent as well as recent reports of aggression to staff. Staff noted that he was staring at his breakfast for prolonged time but not interacting or eating. He was also observed to fall three times last night as well as twice over the past week. His family is at bedside and do not feel that he remains confused beyond his baseline (which includes great trouble with short-term memory) but they did note dysarthria, slurred speech, that started last Tuesday. They also noted that he had a urinary catheter in place until about one month ago, secondary to a traumatic neck injury in March of this year.  Despite this, the patient has no concerns, stating he has felt well recently. His only complaint is some unsteadiness on his feet that he attributes to "slowing down over the last two years" and also L neck pain that is chronic. He denies new headaches, vision changes, chest pain, palpitations, abdominal pain, suprapubic pain, dysuria, hematuria,  or frequency. He admits that he has more trouble with speech recently, but cannot explain it. He does not recall any of his falls.  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 He received skilled care at [facility] and was recently transitioned to memory care where he works with PT/OT to regain strength.   ED Course: He arrived from his care facility [facility] around 930 am complaining of confusion. He was in no distress and vitals were stable, only with mild hypertension around 145/67. Exam revealed dysarthria and impaired memory/cognition. Early on it was noted that the patient started Seroquel about one month ago and so there was suspicion that his confusion was medication-related. CBC revealed leukocytosis of 10.7 and anemia with Hg 10.8 improved from 8.6 earlier this year. CMP revealed Cr 1.83 around a baseline of 1.6. UA significant for bacteria, leukocytes, nitrites and he was started on Keflex for UTI, cultures pending. Per recent falls, CT was negative but MRI revealed multiple small acute infarcts in the L corona radiata. He was not felt to be a TNK candidate with an unclear time of onset of his symptoms.  Normal studies: TSH, Ammonia, ethanol.  Past Medical  History Past Medical History:  Diagnosis Date   A-fib (HCC)    Anemia    Angina pectoris (HCC)    Arthritis    Carpal tunnel syndrome    Cobalamin deficiency    Coronary artery disease    Diabetes mellitus without complication (HCC)    Eczema    Hyperlipemia    Hypertension    Kidney disease, chronic, stage III (moderate, EGFR 30-59 ml/min) (HCC)    Lung infiltrate    Myocardial infarct, old    Prostatic hypertrophy    Sleep apnea, obstructive    Squamous cell cancer of lip      Meds:  Clopidogrel 75mg  Escitalopram 10mg  Hydrochlorothiazide 12.5mg   every day Insulin Detemir 6u in afternoon Janumet 50-1000mg  0.5 tablets BID Memantine 10mg  in am and 5mg  in pm Metoprolol sucinate 50mg  BID Pantoprazole 10mg  Remeron 15mg  at bedtime Seroquel 25mg -50mg  at bedtime Sodium chloride 1g every day Flomax 0.4mg  at bedtime  amlodipine 5mg  BID Nitrolgycerin 0.4mg  prn Cyclobenzaprine 5mg  prn Ativan 1mg  TID prn for agitation/aggresion Tylenol 1g TID Biofreeze gel to shoulders/neck Omega-3 fish oil 1400mg  Eucerin itch relief lotion for lower legs Benefiber Calcium 500 + Vit D 500-5  Past Surgical History Past Surgical History:  Procedure Laterality Date   CORONARY ANGIOPLASTY WITH STENT PLACEMENT     POSTERIOR CERVICAL FUSION/FORAMINOTOMY N/A 02/01/2023   Procedure: CERVICAL FIVE - THORACIC ONE LAMINECTOMY, AND INSTRUMENTED FUSION;  Surgeon: Jadene Pierini, MD;  Location: [facility] OR;  Service: Neurosurgery;  Laterality: N/A;   Social:  Lives at [employer] nursing facility Occupation: Retired Support: nursing home, family close by Level of Function: Memory impaired, assistance at facility, uses wheelchair PCP: Nadara Eaton, MD Substances: No smoking, alcohol, drugs  Family History:  HTN  Allergies: Allergies as of 09/06/2023 - Review Complete 09/06/2023  Allergen Reaction Noted   Baclofen Other (See Comments) 07/25/2019   Bee venom Anaphylaxis 08/16/2012   Contrast media [iodinated contrast media] Anaphylaxis 09/08/2016   Iodine-131 Anaphylaxis 08/16/2012   Metrizamide Anaphylaxis 09/08/2016   Metronidazole Anaphylaxis 07/23/2019   Atorvastatin Other (See Comments) 08/16/2012   Lisinopril Cough 08/16/2012    Review of Systems: A complete ROS was negative except as per HPI.   OBJECTIVE:   Physical Exam: Blood pressure (!) 152/64, pulse 61, temperature 98.1 F (36.7 C), temperature source Oral, resp. rate 14, SpO2 99%.  Constitutional:Well appearing man. In no acute distress. HENT: Normocephalic, atraumatic,   Eyes: Sclera non-icteric, PERRL, EOM intact Neck:normal atraumatic, no neck masses, normal thyroid, no jvd Cardio:Regular rate and rhythm. No murmurs, rubs, or gallops. 2+ bilateral radial pulses. Carotid bruits bilaterally. Pulm:Clear to auscultation bilaterally. Normal work of breathing on room air. Abdomen: Soft, non-tender, non-distended, positive bowel sounds. No suprapubic tenderness. MSK:1+ edema in bilateral LEs, slightly tender. Skin:Warm and dry. Neuro:Alert and oriented x3. No focal deficit noted. Psych:Pleasant mood and affect.  Labs: CBC    Component Value Date/Time   WBC 10.7 (H) 09/06/2023 0943   RBC 3.94 (L) 09/06/2023 0943   HGB 10.8 (L) 09/06/2023 0943   HCT 34.0 (L) 09/06/2023 0943   PLT 222 09/06/2023 0943   MCV 86.3 09/06/2023 0943   MCH 27.4 09/06/2023 0943   MCHC 31.8 09/06/2023 0943   RDW 15.2 09/06/2023 0943   LYMPHSABS 3.7 02/01/2023 1316   MONOABS 1.1 (H) 02/01/2023 1316   EOSABS 0.3 02/01/2023 1316   BASOSABS 0.1 02/01/2023 1316     CMP     Component Value Date/Time   NA 137  09/06/2023 0943   K 3.8 09/06/2023 0943   CL 103 09/06/2023 0943   CO2 21 (L) 09/06/2023 0943   GLUCOSE 167 (H) 09/06/2023 0943   BUN 22 09/06/2023 0943   CREATININE 1.83 (H) 09/06/2023 0943   CALCIUM 9.4 09/06/2023 0943   PROT 7.8 09/06/2023 0943   ALBUMIN 3.5 09/06/2023 0943   AST 20 09/06/2023 0943   ALT 9 09/06/2023 0943   ALKPHOS 67 09/06/2023 0943   BILITOT 0.7 09/06/2023 0943   GFRNONAA 35 (L) 09/06/2023 0943   GFRAA >60 11/04/2019 1614    Imaging: MR ANGIO HEAD WO CONTRAST  Result Date: 09/06/2023 CLINICAL DATA:  Stroke follow-up EXAM: MRA NECK WITHOUT CONTRAST MRA HEAD WITHOUT CONTRAST TECHNIQUE: Angiographic images of the Circle of Willis were acquired using MRA technique without intravenous contrast. COMPARISON:  05/12/2021 FINDINGS: Motion degraded examination. MRA NECK FINDINGS Aortic arch: Normal 3 vessel branching pattern. Proximal subclavian  arteries are normal. Right carotid system: Imaging of the common carotid arteries obscured by motion. The internal carotid artery is normal. Left carotid system: Motion obscures visualization of the common carotid artery, including bifurcation. However, there is likely severe stenosis of the proximal ICA. The ICA is patent with antegrade flow to the skull base. Vertebral arteries: Left dominant configuration. Limited visualization of both V1 segments, possibly due to surrounding artifacts. Otherwise, the vertebral arteries are patent with antegrade flow. Other: None. MRA HEAD FINDINGS POSTERIOR CIRCULATION: --Vertebral arteries: Normal --Inferior cerebellar arteries: Normal. --Basilar artery: Normal. --Superior cerebellar arteries: Normal. --Posterior cerebral arteries: Severe stenosis at the right P1 segment. Otherwise widely patent. ANTERIOR CIRCULATION: --Intracranial internal carotid arteries: Normal. --Anterior cerebral arteries (ACA): Normal. --Middle cerebral arteries (MCA): Right MCA is patent without high-grade stenosis. There is moderate narrowing of the distal M1 segment of the left MCA, which is otherwise normal. Anatomic variants: None IMPRESSION: 1. Motion degraded examination. 2. Likely severe stenosis of the proximal left ICA. 3. Moderate narrowing of the distal M1 segment of the left MCA. 4. Severe right PCA P1 segment stenosis. Electronically Signed   By: Deatra Robinson M.D.   On: 09/06/2023 22:31   MR ANGIO NECK WO CONTRAST  Result Date: 09/06/2023 CLINICAL DATA:  Stroke follow-up EXAM: MRA NECK WITHOUT CONTRAST MRA HEAD WITHOUT CONTRAST TECHNIQUE: Angiographic images of the Circle of Willis were acquired using MRA technique without intravenous contrast. COMPARISON:  05/12/2021 FINDINGS: Motion degraded examination. MRA NECK FINDINGS Aortic arch: Normal 3 vessel branching pattern. Proximal subclavian arteries are normal. Right carotid system: Imaging of the common carotid arteries obscured by  motion. The internal carotid artery is normal. Left carotid system: Motion obscures visualization of the common carotid artery, including bifurcation. However, there is likely severe stenosis of the proximal ICA. The ICA is patent with antegrade flow to the skull base. Vertebral arteries: Left dominant configuration. Limited visualization of both V1 segments, possibly due to surrounding artifacts. Otherwise, the vertebral arteries are patent with antegrade flow. Other: None. MRA HEAD FINDINGS POSTERIOR CIRCULATION: --Vertebral arteries: Normal --Inferior cerebellar arteries: Normal. --Basilar artery: Normal. --Superior cerebellar arteries: Normal. --Posterior cerebral arteries: Severe stenosis at the right P1 segment. Otherwise widely patent. ANTERIOR CIRCULATION: --Intracranial internal carotid arteries: Normal. --Anterior cerebral arteries (ACA): Normal. --Middle cerebral arteries (MCA): Right MCA is patent without high-grade stenosis. There is moderate narrowing of the distal M1 segment of the left MCA, which is otherwise normal. Anatomic variants: None IMPRESSION: 1. Motion degraded examination. 2. Likely severe stenosis of the proximal left ICA. 3. Moderate narrowing of the  distal M1 segment of the left MCA. 4. Severe right PCA P1 segment stenosis. Electronically Signed   By: Deatra Robinson M.D.   On: 09/06/2023 22:31   MR BRAIN WO CONTRAST  Result Date: 09/06/2023 CLINICAL DATA:  Neuro deficit, acute, stroke suspected new dysarthria EXAM: MRI HEAD WITHOUT CONTRAST TECHNIQUE: Multiplanar, multiecho pulse sequences of the brain and surrounding structures were obtained without intravenous contrast. COMPARISON:  CT head October 22 24. FINDINGS: Brain: Multiple small acute infarcts in the left corona radiata. No substantial mass effect. Patchy T2/FLAIR hyperintensities in the white matter are nonspecific but compatible with chronic microvascular disease. Generalized cerebral atrophy. No evidence of acute  hemorrhage, mass lesion, midline shift or hydrocephalus. Vascular: Major arterial flow voids are maintained skull base. Skull and upper cervical spine: Normal marrow signal. Sinuses/Orbits: Mild paranasal sinus mucosal thickening. No acute orbital findings. Other: No mastoid effusions. IMPRESSION: 1. Multiple small acute infarcts in the left corona radiata. 2. Moderate chronic microvascular ischemic disease. Electronically Signed   By: Feliberto Harts M.D.   On: 09/06/2023 16:09   DG Chest Portable 1 View  Result Date: 09/06/2023 CLINICAL DATA:  Weakness. EXAM: PORTABLE CHEST 1 VIEW COMPARISON:  Chest radiograph 02/01/2023. FINDINGS: Low lung volumes accentuate the pulmonary vasculature and cardiomediastinal silhouette. No consolidation or pulmonary edema. No pleural effusion or pneumothorax. IMPRESSION: Low lung volumes without evidence of acute cardiopulmonary disease. Electronically Signed   By: Orvan Falconer M.D.   On: 09/06/2023 11:46   CT Head Wo Contrast  Result Date: 09/06/2023 CLINICAL DATA:  Mental status change, unknown cause; Neck pain, acute, no red flags. EXAM: CT HEAD WITHOUT CONTRAST CT CERVICAL SPINE WITHOUT CONTRAST TECHNIQUE: Multidetector CT imaging of the head and cervical spine was performed following the standard protocol without intravenous contrast. Multiplanar CT image reconstructions of the cervical spine were also generated. RADIATION DOSE REDUCTION: This exam was performed according to the departmental dose-optimization program which includes automated exposure control, adjustment of the mA and/or kV according to patient size and/or use of iterative reconstruction technique. COMPARISON:  CT head and cervical spine 02/01/2023. FINDINGS: CT HEAD FINDINGS Brain: No acute hemorrhage. Unchanged moderate to severe chronic small-vessel disease. Cortical gray-white differentiation is otherwise preserved. Prominence of the ventricles and sulci within expected range for age. No  hydrocephalus or extra-axial collection. No mass effect or midline shift. Vascular: No hyperdense vessel or unexpected calcification. Skull: No calvarial fracture or suspicious bone lesion. Skull base is unremarkable. Sinuses/Orbits: No acute finding. Other: None. CT CERVICAL SPINE FINDINGS Alignment: 3 mm degenerative anterolisthesis of C4 on C5. Skull base and vertebrae: Interval postoperative changes of C5-C7 laminectomy and posterior spinal fusion. Hardware is intact. No associated lucency or fracture. Soft tissues and spinal canal: No prevertebral fluid or swelling. No visible canal hematoma. Disc levels: C2-C3: Acquired fusion. No spinal canal stenosis or neural foraminal narrowing. C3-C4: Left eccentric disc bulge results in mild spinal canal stenosis. Facet arthropathy uncovertebral joint spurring results in moderate bilateral neural foraminal narrowing. C4-C5: Anterolisthesis with uncovered disc results in mild spinal canal stenosis. Right-greater-than-left facet arthropathy and uncovertebral joint spurring results in severe right and moderate left neural foraminal narrowing. C5-C6: Interval laminectomy. No spinal canal stenosis or significant neural foraminal narrowing. C6-C7: Interval laminectomy. No spinal canal stenosis or significant neural foraminal narrowing. C7-T1: Interval laminectomy. No spinal canal stenosis. Left-greater-than-right facet arthropathy and endplate osteophytes results in moderate left neural foraminal narrowing. Upper chest: No acute findings. Other: Atherosclerotic calcifications of the carotid bulbs. IMPRESSION: 1. No  acute intracranial abnormality. Unchanged moderate to severe chronic small-vessel disease. 2. No acute cervical spine fracture or traumatic listhesis. 3. Interval postoperative changes of C5-C7 laminectomy and posterior spinal fusion. No evidence of hardware complication. 4. Multilevel cervical spondylosis, worst at C4-C5, where there is mild spinal canal stenosis  and severe right and moderate left neural foraminal narrowing. Electronically Signed   By: Orvan Falconer M.D.   On: 09/06/2023 11:45   CT Cervical Spine Wo Contrast  Result Date: 09/06/2023 CLINICAL DATA:  Mental status change, unknown cause; Neck pain, acute, no red flags. EXAM: CT HEAD WITHOUT CONTRAST CT CERVICAL SPINE WITHOUT CONTRAST TECHNIQUE: Multidetector CT imaging of the head and cervical spine was performed following the standard protocol without intravenous contrast. Multiplanar CT image reconstructions of the cervical spine were also generated. RADIATION DOSE REDUCTION: This exam was performed according to the departmental dose-optimization program which includes automated exposure control, adjustment of the mA and/or kV according to patient size and/or use of iterative reconstruction technique. COMPARISON:  CT head and cervical spine 02/01/2023. FINDINGS: CT HEAD FINDINGS Brain: No acute hemorrhage. Unchanged moderate to severe chronic small-vessel disease. Cortical gray-white differentiation is otherwise preserved. Prominence of the ventricles and sulci within expected range for age. No hydrocephalus or extra-axial collection. No mass effect or midline shift. Vascular: No hyperdense vessel or unexpected calcification. Skull: No calvarial fracture or suspicious bone lesion. Skull base is unremarkable. Sinuses/Orbits: No acute finding. Other: None. CT CERVICAL SPINE FINDINGS Alignment: 3 mm degenerative anterolisthesis of C4 on C5. Skull base and vertebrae: Interval postoperative changes of C5-C7 laminectomy and posterior spinal fusion. Hardware is intact. No associated lucency or fracture. Soft tissues and spinal canal: No prevertebral fluid or swelling. No visible canal hematoma. Disc levels: C2-C3: Acquired fusion. No spinal canal stenosis or neural foraminal narrowing. C3-C4: Left eccentric disc bulge results in mild spinal canal stenosis. Facet arthropathy uncovertebral joint spurring  results in moderate bilateral neural foraminal narrowing. C4-C5: Anterolisthesis with uncovered disc results in mild spinal canal stenosis. Right-greater-than-left facet arthropathy and uncovertebral joint spurring results in severe right and moderate left neural foraminal narrowing. C5-C6: Interval laminectomy. No spinal canal stenosis or significant neural foraminal narrowing. C6-C7: Interval laminectomy. No spinal canal stenosis or significant neural foraminal narrowing. C7-T1: Interval laminectomy. No spinal canal stenosis. Left-greater-than-right facet arthropathy and endplate osteophytes results in moderate left neural foraminal narrowing. Upper chest: No acute findings. Other: Atherosclerotic calcifications of the carotid bulbs. IMPRESSION: 1. No acute intracranial abnormality. Unchanged moderate to severe chronic small-vessel disease. 2. No acute cervical spine fracture or traumatic listhesis. 3. Interval postoperative changes of C5-C7 laminectomy and posterior spinal fusion. No evidence of hardware complication. 4. Multilevel cervical spondylosis, worst at C4-C5, where there is mild spinal canal stenosis and severe right and moderate left neural foraminal narrowing. Electronically Signed   By: Orvan Falconer M.D.   On: 09/06/2023 11:45     EKG: personally reviewed my interpretation is normal sinus rhythm with 1st degree AV block. Consistent with prior EKG.  ASSESSMENT & PLAN:   Assessment & Plan by Problem: Principal Problem:   Acute cerebral infarction (HCC) Active Problems:   PAF (paroxysmal atrial fibrillation) (HCC)   Diabetes mellitus type 2, controlled (HCC)   UTI (urinary tract infection)   CKD (chronic kidney disease), stage III (HCC)   DAYMEIN GELFAND is a 88 y.o. male with pertinent PMH of Afib not anticoagulated, CAD with multiple stents, previous CVA, Dementia, CKD3b, T2DM with neuropathy, C6/7 fracture s/p laminectomy with epidural  hematoma s/p evacuation 01/2023 , who presented  with altered mental status and is admitted for encephalopathy in setting of acute cerebral infarction and UTI.  Acute Cerebral Infarction This is a patient presenting with dysarthria x 1 week and somnolence and recent falls that started today. ED MRI revealed multiple acute infarcts of the L corona radiata. At admission, he is at his baseline mental status per family, which entails significant trouble with short term memory and no orientation to time (but he is oriented to person and place). There are no appreciable deficits on exam and he appears neurologically intact. Most obvious risk factor is history of Afib and currently not anticoagulated. Likely not candidate for thrombolytics with symptoms that extend to last week. - MRI revealed: multiple small acute infarcts in the L corona radiata - Neurology is on board, appreciate it. MR angio head/neck and echo with bubble pending - PT/OT/SLP eval - Lipid panel, Hg A1c  Cystitis Pt with recent change in mental status now shown to have infectious UA. He is not complaining of symptoms including dysuria, frequency, hematuria, suprapubic pain. Creatinine shows a mild elevation but he is grossly near his baseline with CKD3b. Up until one month ago, he had a urinary catheter in place due to his recent neck trauma in March, but do not feel that is contributing to his current infection. For now, will continue treatment with keflex and await cultures. - Continue Keflex 500mg  - Cultures pending - Bladder scan  Paroxysmal Afib Pt's rhythm is regular at time of admission and there is no tachycardia or hemodynamic instability. It appears the patient was anticoagulated in the past but stopped in March likely in context of his neck injury that required cervical fusion. He remains on clopidogrel, with facility records suggesting he is on the medicine is for his Afib. At that point, he was anticoagulated and progressed to an epidural hematoma. Today, will restart  Eliquis at reduced dose as he will be in hospital but will initiate discussion about risks/benefits of long-term anticoagulation given his many recent falls. - Start Eliquis 2.5mg  BID - continue home metoprolol succinate 100 BID  Dementia At baseline at time of admission. Aox2 (not oriented to time). Significant short term memory deficits. Pt is a poor historian. - continue home memantine  Chronic Stable Issues  Chronic Anemia Stable. 10.6 today, labs from her perioperative period had Hg 8-9. Not complaining of dyspnea, fatigue. Will monitor.  CKD3b Mild elevation in creatinine to 1.83, although his baseline appears to be around 1.6-1.7. Will monitor in pt with UTI.  HTN - continue home amlodipine 5mg   CAD - continue home clopidogrel 75mg   Depression/anxiety - continue home lexapro 10mg   BPH - continue home flomax  T2DM with neuropathy - Takes insulin detemir 6u daily. Will check A1c, most recent appears 7.5.   Diet: NPO pending SLP eval VTE: DOAC IVF: None,None Code: Full  Dispo: Admit patient to Inpatient with expected length of stay greater than 2 midnights.  Signed: Katheran James, DO Internal Medicine Resident PGY-1  09/07/2023, 6:45 AM   Dr. Katheran James, DO Pager [phone]

## 2023-09-06 NOTE — ED Notes (Signed)
Admitting providers at bedside.

## 2023-09-06 NOTE — ED Provider Notes (Signed)
  Physical Exam  BP (!) 152/64 (BP Location: Left Arm)   Pulse 61   Temp 98.1 F (36.7 C) (Oral)   Resp 14   SpO2 99%   Physical Exam  Procedures  Procedures  ED Course / MDM    Medical Decision Making Amount and/or Complexity of Data Reviewed Labs: ordered. Radiology: ordered.  Risk Prescription drug management.   Received in signout.  Decreased mental status.  Reported some difficulty speaking.  Urinalysis showed infection has had recent change in medicines, creatinine mildly increased.  However MRI showed some acute strokes.  Will discuss with neurology admit to unassigned medicine.  Not a TNK candidate due to deficits at time of onset.   Benjiman Core, MD 09/06/23 [phone]

## 2023-09-06 NOTE — ED Notes (Signed)
ED TO INPATIENT HANDOFF REPORT  ED Nurse Name and Phone #: Dahlia Client [phone]  S Name/Age/Gender Duane [name] 88 y.o. male Room/Bed: 039C/039C  Code Status   Code Status: Prior  Home/SNF/Other Nursing Home Patient oriented to: self Is this baseline? Yes   Triage Complete: Triage complete  Chief Complaint Lethargy Medicince Change  Triage Note Patient arrived from [facility] for evaluation of increased somnolence. Patient has reported dementia and has been having some aggression to staff. Seroquel started and with no change dose doubled now sleepy. No complains and answering questions appropriately   Allergies Allergies  Allergen Reactions   Baclofen Other (See Comments)    Severe AMS, comatose, intubated   Bee Venom Anaphylaxis   Contrast Media [Iodinated Contrast Media] Anaphylaxis   Iodine-131 Anaphylaxis   Metrizamide Anaphylaxis   Metronidazole Anaphylaxis   Atorvastatin Other (See Comments)    Aching in legs   Lisinopril Cough    Level of Care/Admitting Diagnosis ED Disposition     ED Disposition  Admit   Condition  --   Comment  The patient appears reasonably stabilized for admission considering the current resources, flow, and capabilities available in the ED at this time, and I doubt any other [facility] requiring further screening and/or treatment in the ED prior to admission is  present.          B Medical/Surgery History Past Medical History:  Diagnosis Date   A-fib (HCC)    Anemia    Angina pectoris (HCC)    Arthritis    Carpal tunnel syndrome    Cobalamin deficiency    Coronary artery disease    Diabetes mellitus without complication (HCC)    Eczema    Hyperlipemia    Hypertension    Kidney disease, chronic, stage III (moderate, EGFR 30-59 ml/min) (HCC)    Lung infiltrate    Myocardial infarct, old    Prostatic hypertrophy    Sleep apnea, obstructive    Squamous cell cancer of lip    Past Surgical History:  Procedure Laterality Date    CORONARY ANGIOPLASTY WITH STENT PLACEMENT     POSTERIOR CERVICAL FUSION/FORAMINOTOMY N/A 02/01/2023   Procedure: CERVICAL FIVE - THORACIC ONE LAMINECTOMY, AND INSTRUMENTED FUSION;  Surgeon: Jadene Pierini, MD;  Location: [facility] OR;  Service: Neurosurgery;  Laterality: N/A;     A IV Location/Drains/Wounds Patient Lines/Drains/Airways Status     Active Line/Drains/Airways     Name Placement date Placement time Site Days   Peripheral IV 09/06/23 20 G Right Arm 09/06/23  --  Arm  less than 1   Urethral Catheter Blanchard Mane RN Double-lumen 16 Fr. 02/06/23  1251  Double-lumen  212            Intake/Output Last 24 hours No intake or output data in the 24 hours ending 09/06/23 1833  Labs/Imaging Results for orders placed or performed during the hospital encounter of 09/06/23 (from the past 48 hour(s))  Comprehensive metabolic panel     Status: Abnormal   Collection Time: 09/06/23  9:43 AM  Result Value Ref Range   Sodium 137 135 - 145 mmol/L   Potassium 3.8 3.5 - 5.1 mmol/L   Chloride 103 98 - 111 mmol/L   CO2 21 (L) 22 - 32 mmol/L   Glucose, Bld 167 (H) 70 - 99 mg/dL    Comment: Glucose reference range applies only to samples taken after fasting for at least 8 hours.   BUN 22 8 - 23 mg/dL  Creatinine, Ser 1.83 (H) 0.61 - 1.24 mg/dL   Calcium 9.4 8.9 - 62.1 mg/dL   Total Protein 7.8 6.5 - 8.1 g/dL   Albumin 3.5 3.5 - 5.0 g/dL   AST 20 15 - 41 U/L   ALT 9 0 - 44 U/L   Alkaline Phosphatase 67 38 - 126 U/L   Total Bilirubin 0.7 0.3 - 1.2 mg/dL   GFR, Estimated 35 (L) >60 mL/min    Comment: (NOTE) Calculated using the CKD-EPI Creatinine Equation (2021)    Anion gap 13 5 - 15    Comment: Performed at [facility], [address], [place], [place] [address]  CBC     Status: Abnormal   Collection Time: 09/06/23  9:43 AM  Result Value Ref Range   WBC 10.7 (H) 4.0 - 10.5 K/uL   RBC 3.94 (L) 4.22 - 5.81 MIL/uL   Hemoglobin 10.8 (L) 13.0 - 17.0 g/dL   HCT 78.4 (L)  69.6 - 52.0 %   MCV 86.3 80.0 - 100.0 fL   MCH 27.4 26.0 - 34.0 pg   MCHC 31.8 30.0 - 36.0 g/dL   RDW 29.5 28.4 - 13.2 %   Platelets 222 150 - 400 K/uL   nRBC 0.0 0.0 - 0.2 %    Comment: Performed at [facility], [address] [place], [place] [address]  Urinalysis, w/ Reflex to Culture (Infection Suspected) -Urine, Clean Catch     Status: Abnormal   Collection Time: 09/06/23  9:43 AM  Result Value Ref Range   Specimen Source URINE, CLEAN CATCH    Color, Urine YELLOW YELLOW   APPearance HAZY (A) CLEAR   Specific Gravity, Urine 1.010 1.005 - 1.030   pH 6.0 5.0 - 8.0   Glucose, UA NEGATIVE NEGATIVE mg/dL   Hgb urine dipstick NEGATIVE NEGATIVE   Bilirubin Urine NEGATIVE NEGATIVE   Ketones, ur NEGATIVE NEGATIVE mg/dL   Protein, ur NEGATIVE NEGATIVE mg/dL   Nitrite POSITIVE (A) NEGATIVE   Leukocytes,Ua MODERATE (A) NEGATIVE   RBC / HPF 0-5 0 - 5 RBC/hpf   WBC, UA 21-50 0 - 5 WBC/hpf    Comment:        Reflex urine culture not performed if WBC <=10, OR if Squamous epithelial cells >5. If Squamous epithelial cells >5 suggest recollection.    Bacteria, UA MANY (A) NONE SEEN   Squamous Epithelial / HPF 0-5 0 - 5 /HPF   Hyaline Casts, UA PRESENT     Comment: Performed at [facility], [address], [place], [place] [address]  TSH     Status: None   Collection Time: 09/06/23  9:43 AM  Result Value Ref Range   TSH 1.766 0.350 - 4.500 uIU/mL    Comment: Performed by a 3rd Generation assay with a functional sensitivity of <=0.01 uIU/mL. Performed at [facility], [address] [place], [place] [address]   CBG monitoring, ED     Status: Abnormal   Collection Time: 09/06/23  9:45 AM  Result Value Ref Range   Glucose-Capillary 167 (H) 70 - 99 mg/dL    Comment: Glucose reference range applies only to samples taken after fasting for at least 8 hours.  Ammonia     Status: None   Collection Time: 09/06/23 10:40 AM  Result Value Ref Range   Ammonia 17 9 - 35  umol/L    Comment: Performed at [facility], [address] [place], [place] [address]  Ethanol     Status: None   Collection Time: 09/06/23 10:40 AM  Result Value Ref Range   Alcohol, Ethyl (B) <10 <10 mg/dL    Comment: (NOTE) Lowest detectable limit for serum alcohol is 10 mg/dL.  For medical purposes only. Performed at [facility], [address], [place], [place] [address]    MR BRAIN WO CONTRAST  Result Date: 09/06/2023 CLINICAL DATA:  Neuro deficit, acute, stroke suspected new dysarthria EXAM: MRI HEAD WITHOUT CONTRAST TECHNIQUE: Multiplanar, multiecho pulse sequences of the brain and surrounding structures were obtained without intravenous contrast. COMPARISON:  CT head October 22 24. FINDINGS: Brain: Multiple small acute infarcts in the left corona radiata. No substantial mass effect. Patchy T2/FLAIR hyperintensities in the white matter are nonspecific but compatible with chronic microvascular disease. Generalized cerebral atrophy. No evidence of acute hemorrhage, mass lesion, midline shift or hydrocephalus. Vascular: Major arterial flow voids are maintained skull base. Skull and upper cervical spine: Normal marrow signal. Sinuses/Orbits: Mild paranasal sinus mucosal thickening. No acute orbital findings. Other: No mastoid effusions. IMPRESSION: 1. Multiple small acute infarcts in the left corona radiata. 2. Moderate chronic microvascular ischemic disease. Electronically Signed   By: Feliberto Harts M.D.   On: 09/06/2023 16:09   DG Chest Portable 1 View  Result Date: 09/06/2023 CLINICAL DATA:  Weakness. EXAM: PORTABLE CHEST 1 VIEW COMPARISON:  Chest radiograph 02/01/2023. FINDINGS: Low lung volumes accentuate the pulmonary vasculature and cardiomediastinal silhouette. No consolidation or pulmonary edema. No pleural effusion or pneumothorax. IMPRESSION: Low lung volumes without evidence of acute cardiopulmonary disease. Electronically Signed   By: Orvan Falconer M.D.    On: 09/06/2023 11:46   CT Head Wo Contrast  Result Date: 09/06/2023 CLINICAL DATA:  Mental status change, unknown cause; Neck pain, acute, no red flags. EXAM: CT HEAD WITHOUT CONTRAST CT CERVICAL SPINE WITHOUT CONTRAST TECHNIQUE: Multidetector CT imaging of the head and cervical spine was performed following the standard protocol without intravenous contrast. Multiplanar CT image reconstructions of the cervical spine were also generated. RADIATION DOSE REDUCTION: This exam was performed according to the departmental dose-optimization program which includes automated exposure control, adjustment of the mA and/or kV according to patient size and/or use of iterative reconstruction technique. COMPARISON:  CT head and cervical spine 02/01/2023. FINDINGS: CT HEAD FINDINGS Brain: No acute hemorrhage. Unchanged moderate to severe chronic small-vessel disease. Cortical gray-white differentiation is otherwise preserved. Prominence of the ventricles and sulci within expected range for age. No hydrocephalus or extra-axial collection. No mass effect or midline shift. Vascular: No hyperdense vessel or unexpected calcification. Skull: No calvarial fracture or suspicious bone lesion. Skull base is unremarkable. Sinuses/Orbits: No acute finding. Other: None. CT CERVICAL SPINE FINDINGS Alignment: 3 mm degenerative anterolisthesis of C4 on C5. Skull base and vertebrae: Interval postoperative changes of C5-C7 laminectomy and posterior spinal fusion. Hardware is intact. No associated lucency or fracture. Soft tissues and spinal canal: No prevertebral fluid or swelling. No visible canal hematoma. Disc levels: C2-C3: Acquired fusion. No spinal canal stenosis or neural foraminal narrowing. C3-C4: Left eccentric disc bulge results in mild spinal canal stenosis. Facet arthropathy uncovertebral joint spurring results in moderate bilateral neural foraminal narrowing. C4-C5: Anterolisthesis with uncovered disc results in mild spinal canal  stenosis. Right-greater-than-left facet arthropathy and uncovertebral joint spurring results in severe right and moderate left neural foraminal narrowing. C5-C6: Interval laminectomy. No spinal canal stenosis or significant neural foraminal narrowing. C6-C7: Interval laminectomy. No spinal canal stenosis or significant neural foraminal narrowing. C7-T1: Interval laminectomy.  No spinal canal stenosis. Left-greater-than-right facet arthropathy and endplate osteophytes results in moderate left neural foraminal narrowing. Upper chest: No acute findings. Other: Atherosclerotic calcifications of the carotid bulbs. IMPRESSION: 1. No acute intracranial abnormality. Unchanged moderate to severe chronic small-vessel disease. 2. No acute cervical spine fracture or traumatic listhesis. 3. Interval postoperative changes of C5-C7 laminectomy and posterior spinal fusion. No evidence of hardware complication. 4. Multilevel cervical spondylosis, worst at C4-C5, where there is mild spinal canal stenosis and severe right and moderate left neural foraminal narrowing. Electronically Signed   By: Orvan Falconer M.D.   On: 09/06/2023 11:45   CT Cervical Spine Wo Contrast  Result Date: 09/06/2023 CLINICAL DATA:  Mental status change, unknown cause; Neck pain, acute, no red flags. EXAM: CT HEAD WITHOUT CONTRAST CT CERVICAL SPINE WITHOUT CONTRAST TECHNIQUE: Multidetector CT imaging of the head and cervical spine was performed following the standard protocol without intravenous contrast. Multiplanar CT image reconstructions of the cervical spine were also generated. RADIATION DOSE REDUCTION: This exam was performed according to the departmental dose-optimization program which includes automated exposure control, adjustment of the mA and/or kV according to patient size and/or use of iterative reconstruction technique. COMPARISON:  CT head and cervical spine 02/01/2023. FINDINGS: CT HEAD FINDINGS Brain: No acute hemorrhage. Unchanged  moderate to severe chronic small-vessel disease. Cortical gray-white differentiation is otherwise preserved. Prominence of the ventricles and sulci within expected range for age. No hydrocephalus or extra-axial collection. No mass effect or midline shift. Vascular: No hyperdense vessel or unexpected calcification. Skull: No calvarial fracture or suspicious bone lesion. Skull base is unremarkable. Sinuses/Orbits: No acute finding. Other: None. CT CERVICAL SPINE FINDINGS Alignment: 3 mm degenerative anterolisthesis of C4 on C5. Skull base and vertebrae: Interval postoperative changes of C5-C7 laminectomy and posterior spinal fusion. Hardware is intact. No associated lucency or fracture. Soft tissues and spinal canal: No prevertebral fluid or swelling. No visible canal hematoma. Disc levels: C2-C3: Acquired fusion. No spinal canal stenosis or neural foraminal narrowing. C3-C4: Left eccentric disc bulge results in mild spinal canal stenosis. Facet arthropathy uncovertebral joint spurring results in moderate bilateral neural foraminal narrowing. C4-C5: Anterolisthesis with uncovered disc results in mild spinal canal stenosis. Right-greater-than-left facet arthropathy and uncovertebral joint spurring results in severe right and moderate left neural foraminal narrowing. C5-C6: Interval laminectomy. No spinal canal stenosis or significant neural foraminal narrowing. C6-C7: Interval laminectomy. No spinal canal stenosis or significant neural foraminal narrowing. C7-T1: Interval laminectomy. No spinal canal stenosis. Left-greater-than-right facet arthropathy and endplate osteophytes results in moderate left neural foraminal narrowing. Upper chest: No acute findings. Other: Atherosclerotic calcifications of the carotid bulbs. IMPRESSION: 1. No acute intracranial abnormality. Unchanged moderate to severe chronic small-vessel disease. 2. No acute cervical spine fracture or traumatic listhesis. 3. Interval postoperative changes  of C5-C7 laminectomy and posterior spinal fusion. No evidence of hardware complication. 4. Multilevel cervical spondylosis, worst at C4-C5, where there is mild spinal canal stenosis and severe right and moderate left neural foraminal narrowing. Electronically Signed   By: Orvan Falconer M.D.   On: 09/06/2023 11:45    Pending Labs Unresulted Labs (From admission, onward)     Start     Ordered   09/06/23 0943  Urine Culture  Once,   R        09/06/23 0943            Vitals/Pain Today's Vitals   09/06/23 1245 09/06/23 1515 09/06/23 1613 09/06/23 1642  BP: (!) 146/66  (!) 152/64   Pulse:  62  61   Resp: 12  14   Temp:   98.1 F (36.7 C)   TempSrc:   Oral   SpO2: 100%  99%   PainSc:  0-No pain 0-No pain 0-No pain    Isolation Precautions No active isolations  Medications Medications  cephALEXin (KEFLEX) capsule 500 mg (500 mg Oral Given 09/06/23 1610)    Mobility walks with device     Focused Assessments Neuro Assessment Handoff:  Swallow screen pass? Yes  Cardiac Rhythm: Normal sinus rhythm NIH Stroke Scale  Dizziness Present: No Headache Present: No Interval: Shift assessment Level of Consciousness (1a.)   : Alert, keenly responsive LOC Questions (1b. )   : Answers neither question correctly LOC Commands (1c. )   : Performs both tasks correctly Best Gaze (2. )  : Normal Visual (3. )  : No visual loss Facial Palsy (4. )    : Normal symmetrical movements Motor Arm, Left (5a. )   : No drift Motor Arm, Right (5b. ) : Drift Motor Leg, Left (6a. )  : No drift Motor Leg, Right (6b. ) : No drift Limb Ataxia (7. ): Absent Sensory (8. )  : Normal, no sensory loss Best Language (9. )  : No aphasia Dysarthria (10. ): Normal Extinction/Inattention (11.)   : No Abnormality Complete NIHSS TOTAL: 3     Neuro Assessment: Exceptions to WDL Neuro Checks:   Initial (09/06/23 1515)  Has TPA been given? No If patient is a Neuro Trauma and patient is going to OR before  floor call report to 4N Charge nurse: [phone] or [phone]   R Recommendations: See Admitting Provider Note  Report given to:   Additional Notes:

## 2023-09-06 NOTE — ED Provider Triage Note (Signed)
Emergency Medicine Provider Triage Evaluation Note  Duane [name] , a 88 y.o. male  was evaluated in triage.  Pt complains of confusion.  Review of Systems  Positive: Neck pain Negative: Chest pain, headache, fevers, abdominal pain  Physical Exam  BP (!) 145/67 (BP Location: Right Arm)   Pulse 67   Temp 97.6 F (36.4 C)   Resp 18   SpO2 100%  Gen:   Awake, no distress   Resp:  Normal effort  MSK:   Moves extremities without difficulty Other:  Neck supple, no c-spine tenderness  Medical Decision Making  Medically screening exam initiated at 9:44 AM.  Appropriate orders placed.  Duane [name] was informed that the remainder of the evaluation will be completed by another provider, this initial triage assessment does not replace that evaluation, and the importance of remaining in the ED until their evaluation is complete.  Patient arriving via EMS for confusion in setting of apparent medication change with Seroquel added. Patient slow to respond and reports neck pain but no falls. No tenderness. Will obtain some workup for altered mental status/confusion but seems likely medication related      Duane Grandchild, MD 09/06/23 [phone]

## 2023-09-06 NOTE — ED Notes (Signed)
ED TO INPATIENT HANDOFF REPORT  ED Nurse Name and Phone #: Pearletha Forge RN # [phone]  S Name/Age/Gender Duane [name] 88 y.o. male Room/Bed: 039C/039C  Code Status   Code Status: Full Code  Home/SNF/Other Nursing Home Patient oriented to: self and place Is this baseline? Yes   Triage Complete: Triage complete  Chief Complaint Cerebral infarction [facility]) [I63.9]  Triage Note Patient arrived from [facility] for evaluation of increased somnolence. Patient has reported dementia and has been having some aggression to staff. Seroquel started and with no change dose doubled now sleepy. No complains and answering questions appropriately   Allergies Allergies  Allergen Reactions   Baclofen Other (See Comments)    Severe AMS, comatose, intubated   Bee Venom Anaphylaxis   Contrast Media [Iodinated Contrast Media] Anaphylaxis   Iodine-131 Anaphylaxis   Metrizamide Anaphylaxis   Metronidazole Anaphylaxis   Atorvastatin Other (See Comments)    Aching in legs   Lisinopril Cough    Level of Care/Admitting Diagnosis ED Disposition     ED Disposition  Admit   Condition  --   Comment  Hospital Area: [facility] [[id number]]  Level of Care: Telemetry Medical [104]  May admit patient to [facility] or [facility] if equivalent level of care is available:: No  Covid Evaluation: Asymptomatic - no recent exposure (last 10 days) testing not required  Diagnosis: Cerebral infarction [facility]) [[id number]]  Admitting Physician: Inez Catalina [phone]  Attending Physician: Inez Catalina [phone]  Certification:: I certify this patient will need inpatient services for at least 2 midnights  Expected Medical Readiness: 09/09/2023          B Medical/Surgery History Past Medical History:  Diagnosis Date   A-fib (HCC)    Anemia    Angina pectoris (HCC)    Arthritis    Carpal tunnel syndrome    Cobalamin deficiency    Coronary artery disease    Diabetes mellitus without  complication (HCC)    Eczema    Hyperlipemia    Hypertension    Kidney disease, chronic, stage III (moderate, EGFR 30-59 ml/min) (HCC)    Lung infiltrate    Myocardial infarct, old    Prostatic hypertrophy    Sleep apnea, obstructive    Squamous cell cancer of lip    Past Surgical History:  Procedure Laterality Date   CORONARY ANGIOPLASTY WITH STENT PLACEMENT     POSTERIOR CERVICAL FUSION/FORAMINOTOMY N/A 02/01/2023   Procedure: CERVICAL FIVE - THORACIC ONE LAMINECTOMY, AND INSTRUMENTED FUSION;  Surgeon: Jadene Pierini, MD;  Location: [facility] OR;  Service: Neurosurgery;  Laterality: N/A;     A IV Location/Drains/Wounds Patient Lines/Drains/Airways Status     Active Line/Drains/Airways     Name Placement date Placement time Site Days   Peripheral IV 09/06/23 20 G Right Arm 09/06/23  --  Arm  less than 1   External Urinary Catheter 09/06/23  2017  --  less than 1            Intake/Output Last 24 hours No intake or output data in the 24 hours ending 09/06/23 2034  Labs/Imaging Results for orders placed or performed during the hospital encounter of 09/06/23 (from the past 48 hour(s))  Comprehensive metabolic panel     Status: Abnormal   Collection Time: 09/06/23  9:43 AM  Result Value Ref Range   Sodium 137 135 - 145 mmol/L   Potassium 3.8 3.5 - 5.1 mmol/L   Chloride 103 98 - 111 mmol/L  CO2 21 (L) 22 - 32 mmol/L   Glucose, Bld 167 (H) 70 - 99 mg/dL    Comment: Glucose reference range applies only to samples taken after fasting for at least 8 hours.   BUN 22 8 - 23 mg/dL   Creatinine, Ser 1.61 (H) 0.61 - 1.24 mg/dL   Calcium 9.4 8.9 - 09.6 mg/dL   Total Protein 7.8 6.5 - 8.1 g/dL   Albumin 3.5 3.5 - 5.0 g/dL   AST 20 15 - 41 U/L   ALT 9 0 - 44 U/L   Alkaline Phosphatase 67 38 - 126 U/L   Total Bilirubin 0.7 0.3 - 1.2 mg/dL   GFR, Estimated 35 (L) >60 mL/min    Comment: (NOTE) Calculated using the CKD-EPI Creatinine Equation (2021)    Anion gap 13 5 - 15     Comment: Performed at [facility], [address] [place], [place] [address]  CBC     Status: Abnormal   Collection Time: 09/06/23  9:43 AM  Result Value Ref Range   WBC 10.7 (H) 4.0 - 10.5 K/uL   RBC 3.94 (L) 4.22 - 5.81 MIL/uL   Hemoglobin 10.8 (L) 13.0 - 17.0 g/dL   HCT 98.1 (L) 19.1 - 47.8 %   MCV 86.3 80.0 - 100.0 fL   MCH 27.4 26.0 - 34.0 pg   MCHC 31.8 30.0 - 36.0 g/dL   RDW 29.5 62.1 - 30.8 %   Platelets 222 150 - 400 K/uL   nRBC 0.0 0.0 - 0.2 %    Comment: Performed at [facility], [address] [place], [place] [address]  Urinalysis, w/ Reflex to Culture (Infection Suspected) -Urine, Clean Catch     Status: Abnormal   Collection Time: 09/06/23  9:43 AM  Result Value Ref Range   Specimen Source URINE, CLEAN CATCH    Color, Urine YELLOW YELLOW   APPearance HAZY (A) CLEAR   Specific Gravity, Urine 1.010 1.005 - 1.030   pH 6.0 5.0 - 8.0   Glucose, UA NEGATIVE NEGATIVE mg/dL   Hgb urine dipstick NEGATIVE NEGATIVE   Bilirubin Urine NEGATIVE NEGATIVE   Ketones, ur NEGATIVE NEGATIVE mg/dL   Protein, ur NEGATIVE NEGATIVE mg/dL   Nitrite POSITIVE (A) NEGATIVE   Leukocytes,Ua MODERATE (A) NEGATIVE   RBC / HPF 0-5 0 - 5 RBC/hpf   WBC, UA 21-50 0 - 5 WBC/hpf    Comment:        Reflex urine culture not performed if WBC <=10, OR if Squamous epithelial cells >5. If Squamous epithelial cells >5 suggest recollection.    Bacteria, UA MANY (A) NONE SEEN   Squamous Epithelial / HPF 0-5 0 - 5 /HPF   Hyaline Casts, UA PRESENT     Comment: Performed at [facility], [address] [place], [place] [address]  TSH     Status: None   Collection Time: 09/06/23  9:43 AM  Result Value Ref Range   TSH 1.766 0.350 - 4.500 uIU/mL    Comment: Performed by a 3rd Generation assay with a functional sensitivity of <=0.01 uIU/mL. Performed at [facility], [address] [place], [place] [address]   CBG monitoring, ED     Status: Abnormal   Collection Time:  09/06/23  9:45 AM  Result Value Ref Range   Glucose-Capillary 167 (H) 70 - 99 mg/dL    Comment: Glucose reference range applies only to samples taken after fasting for at least 8  hours.  Ammonia     Status: None   Collection Time: 09/06/23 10:40 AM  Result Value Ref Range   Ammonia 17 9 - 35 umol/L    Comment: Performed at [facility], [address], [place], [place] [address]  Ethanol     Status: None   Collection Time: 09/06/23 10:40 AM  Result Value Ref Range   Alcohol, Ethyl (B) <10 <10 mg/dL    Comment: (NOTE) Lowest detectable limit for serum alcohol is 10 mg/dL.  For medical purposes only. Performed at [facility], [address] [place], [place] [address]    MR BRAIN WO CONTRAST  Result Date: 09/06/2023 CLINICAL DATA:  Neuro deficit, acute, stroke suspected new dysarthria EXAM: MRI HEAD WITHOUT CONTRAST TECHNIQUE: Multiplanar, multiecho pulse sequences of the brain and surrounding structures were obtained without intravenous contrast. COMPARISON:  CT head October 22 24. FINDINGS: Brain: Multiple small acute infarcts in the left corona radiata. No substantial mass effect. Patchy T2/FLAIR hyperintensities in the white matter are nonspecific but compatible with chronic microvascular disease. Generalized cerebral atrophy. No evidence of acute hemorrhage, mass lesion, midline shift or hydrocephalus. Vascular: Major arterial flow voids are maintained skull base. Skull and upper cervical spine: Normal marrow signal. Sinuses/Orbits: Mild paranasal sinus mucosal thickening. No acute orbital findings. Other: No mastoid effusions. IMPRESSION: 1. Multiple small acute infarcts in the left corona radiata. 2. Moderate chronic microvascular ischemic disease. Electronically Signed   By: Feliberto Harts M.D.   On: 09/06/2023 16:09   DG Chest Portable 1 View  Result Date: 09/06/2023 CLINICAL DATA:  Weakness. EXAM: PORTABLE CHEST 1 VIEW COMPARISON:  Chest radiograph 02/01/2023.  FINDINGS: Low lung volumes accentuate the pulmonary vasculature and cardiomediastinal silhouette. No consolidation or pulmonary edema. No pleural effusion or pneumothorax. IMPRESSION: Low lung volumes without evidence of acute cardiopulmonary disease. Electronically Signed   By: Orvan Falconer M.D.   On: 09/06/2023 11:46   CT Head Wo Contrast  Result Date: 09/06/2023 CLINICAL DATA:  Mental status change, unknown cause; Neck pain, acute, no red flags. EXAM: CT HEAD WITHOUT CONTRAST CT CERVICAL SPINE WITHOUT CONTRAST TECHNIQUE: Multidetector CT imaging of the head and cervical spine was performed following the standard protocol without intravenous contrast. Multiplanar CT image reconstructions of the cervical spine were also generated. RADIATION DOSE REDUCTION: This exam was performed according to the departmental dose-optimization program which includes automated exposure control, adjustment of the mA and/or kV according to patient size and/or use of iterative reconstruction technique. COMPARISON:  CT head and cervical spine 02/01/2023. FINDINGS: CT HEAD FINDINGS Brain: No acute hemorrhage. Unchanged moderate to severe chronic small-vessel disease. Cortical gray-white differentiation is otherwise preserved. Prominence of the ventricles and sulci within expected range for age. No hydrocephalus or extra-axial collection. No mass effect or midline shift. Vascular: No hyperdense vessel or unexpected calcification. Skull: No calvarial fracture or suspicious bone lesion. Skull base is unremarkable. Sinuses/Orbits: No acute finding. Other: None. CT CERVICAL SPINE FINDINGS Alignment: 3 mm degenerative anterolisthesis of C4 on C5. Skull base and vertebrae: Interval postoperative changes of C5-C7 laminectomy and posterior spinal fusion. Hardware is intact. No associated lucency or fracture. Soft tissues and spinal canal: No prevertebral fluid or swelling. No visible canal hematoma. Disc levels: C2-C3: Acquired fusion. No  spinal canal stenosis or neural foraminal narrowing. C3-C4: Left eccentric disc bulge results in mild spinal canal stenosis. Facet arthropathy uncovertebral joint spurring results in moderate bilateral neural foraminal narrowing. C4-C5: Anterolisthesis with uncovered disc results in mild  spinal canal stenosis. Right-greater-than-left facet arthropathy and uncovertebral joint spurring results in severe right and moderate left neural foraminal narrowing. C5-C6: Interval laminectomy. No spinal canal stenosis or significant neural foraminal narrowing. C6-C7: Interval laminectomy. No spinal canal stenosis or significant neural foraminal narrowing. C7-T1: Interval laminectomy. No spinal canal stenosis. Left-greater-than-right facet arthropathy and endplate osteophytes results in moderate left neural foraminal narrowing. Upper chest: No acute findings. Other: Atherosclerotic calcifications of the carotid bulbs. IMPRESSION: 1. No acute intracranial abnormality. Unchanged moderate to severe chronic small-vessel disease. 2. No acute cervical spine fracture or traumatic listhesis. 3. Interval postoperative changes of C5-C7 laminectomy and posterior spinal fusion. No evidence of hardware complication. 4. Multilevel cervical spondylosis, worst at C4-C5, where there is mild spinal canal stenosis and severe right and moderate left neural foraminal narrowing. Electronically Signed   By: Orvan Falconer M.D.   On: 09/06/2023 11:45   CT Cervical Spine Wo Contrast  Result Date: 09/06/2023 CLINICAL DATA:  Mental status change, unknown cause; Neck pain, acute, no red flags. EXAM: CT HEAD WITHOUT CONTRAST CT CERVICAL SPINE WITHOUT CONTRAST TECHNIQUE: Multidetector CT imaging of the head and cervical spine was performed following the standard protocol without intravenous contrast. Multiplanar CT image reconstructions of the cervical spine were also generated. RADIATION DOSE REDUCTION: This exam was performed according to the  departmental dose-optimization program which includes automated exposure control, adjustment of the mA and/or kV according to patient size and/or use of iterative reconstruction technique. COMPARISON:  CT head and cervical spine 02/01/2023. FINDINGS: CT HEAD FINDINGS Brain: No acute hemorrhage. Unchanged moderate to severe chronic small-vessel disease. Cortical gray-white differentiation is otherwise preserved. Prominence of the ventricles and sulci within expected range for age. No hydrocephalus or extra-axial collection. No mass effect or midline shift. Vascular: No hyperdense vessel or unexpected calcification. Skull: No calvarial fracture or suspicious bone lesion. Skull base is unremarkable. Sinuses/Orbits: No acute finding. Other: None. CT CERVICAL SPINE FINDINGS Alignment: 3 mm degenerative anterolisthesis of C4 on C5. Skull base and vertebrae: Interval postoperative changes of C5-C7 laminectomy and posterior spinal fusion. Hardware is intact. No associated lucency or fracture. Soft tissues and spinal canal: No prevertebral fluid or swelling. No visible canal hematoma. Disc levels: C2-C3: Acquired fusion. No spinal canal stenosis or neural foraminal narrowing. C3-C4: Left eccentric disc bulge results in mild spinal canal stenosis. Facet arthropathy uncovertebral joint spurring results in moderate bilateral neural foraminal narrowing. C4-C5: Anterolisthesis with uncovered disc results in mild spinal canal stenosis. Right-greater-than-left facet arthropathy and uncovertebral joint spurring results in severe right and moderate left neural foraminal narrowing. C5-C6: Interval laminectomy. No spinal canal stenosis or significant neural foraminal narrowing. C6-C7: Interval laminectomy. No spinal canal stenosis or significant neural foraminal narrowing. C7-T1: Interval laminectomy. No spinal canal stenosis. Left-greater-than-right facet arthropathy and endplate osteophytes results in moderate left neural foraminal  narrowing. Upper chest: No acute findings. Other: Atherosclerotic calcifications of the carotid bulbs. IMPRESSION: 1. No acute intracranial abnormality. Unchanged moderate to severe chronic small-vessel disease. 2. No acute cervical spine fracture or traumatic listhesis. 3. Interval postoperative changes of C5-C7 laminectomy and posterior spinal fusion. No evidence of hardware complication. 4. Multilevel cervical spondylosis, worst at C4-C5, where there is mild spinal canal stenosis and severe right and moderate left neural foraminal narrowing. Electronically Signed   By: Orvan Falconer M.D.   On: 09/06/2023 11:45    Pending Labs Unresulted Labs (From admission, onward)     Start     Ordered   09/07/23 0500  Hemoglobin A1c  Tomorrow morning,   R        09/06/23 1838   09/07/23 0500  Basic metabolic panel  Tomorrow morning,   R        09/06/23 1838   09/07/23 0500  CBC  Tomorrow morning,   R        09/06/23 1838   09/07/23 0500  Lipid panel  Tomorrow morning,   R        09/06/23 1838   09/06/23 0943  Urine Culture  Once,   R        09/06/23 0943            Vitals/Pain Today's Vitals   09/06/23 1800 09/06/23 1900 09/06/23 2016 09/06/23 2024  BP: (!) 163/70 (!) 152/61  (!) 143/64  Pulse: 75 77  73  Resp: 15 15  11   Temp:   98.9 F (37.2 C) 98.2 F (36.8 C)  TempSrc:   Oral Oral  SpO2: 98% 96%  96%  PainSc:    0-No pain    Isolation Precautions No active isolations  Medications Medications  acetaminophen (TYLENOL) tablet 650 mg (has no administration in time range)  docusate sodium (COLACE) capsule 100 mg (has no administration in time range)  polyethylene glycol (MIRALAX / GLYCOLAX) packet 17 g (has no administration in time range)  cephALEXin (KEFLEX) capsule 500 mg (has no administration in time range)  apixaban (ELIQUIS) tablet 2.5 mg (has no administration in time range)  amLODipine (NORVASC) tablet 5 mg (has no administration in time range)  clopidogrel (PLAVIX)  tablet 75 mg (has no administration in time range)  escitalopram (LEXAPRO) tablet 10 mg (has no administration in time range)  LORazepam (ATIVAN) tablet 1 mg (has no administration in time range)  memantine (NAMENDA) tablet 5 mg (has no administration in time range)  metoprolol succinate (TOPROL-XL) 24 hr tablet 100 mg (100 mg Oral Given 09/06/23 2032)  tamsulosin (FLOMAX) capsule 0.4 mg (has no administration in time range)  cephALEXin (KEFLEX) capsule 500 mg (500 mg Oral Given 09/06/23 1610)    Mobility walks     Focused Assessments Neuro Assessment Handoff:  Swallow screen pass? Yes  Cardiac Rhythm: Normal sinus rhythm NIH Stroke Scale  Dizziness Present: No Headache Present: No Interval: Shift assessment Level of Consciousness (1a.)   : Alert, keenly responsive LOC Questions (1b. )   : Answers neither question correctly LOC Commands (1c. )   : Performs both tasks correctly Best Gaze (2. )  : Normal Visual (3. )  : No visual loss Facial Palsy (4. )    : Normal symmetrical movements Motor Arm, Left (5a. )   : No drift Motor Arm, Right (5b. ) : Drift Motor Leg, Left (6a. )  : No drift Motor Leg, Right (6b. ) : No drift Limb Ataxia (7. ): Absent Sensory (8. )  : Normal, no sensory loss Best Language (9. )  : No aphasia Dysarthria (10. ): Normal Extinction/Inattention (11.)   : No Abnormality Complete NIHSS TOTAL: 3     Neuro Assessment: Exceptions to WDL Neuro Checks:   Initial (09/06/23 1515)  Has TPA been given? No If patient is a Neuro Trauma and patient is going to OR before floor call report to 4N Charge nurse: [phone] or [phone]   R Recommendations: See Admitting Provider Note  Report given to:   Additional Notes:

## 2023-09-07 ENCOUNTER — Other Ambulatory Visit (HOSPITAL_COMMUNITY): Payer: Self-pay

## 2023-09-07 ENCOUNTER — Inpatient Hospital Stay (HOSPITAL_BASED_OUTPATIENT_CLINIC_OR_DEPARTMENT_OTHER): Payer: Medicare Other

## 2023-09-07 DIAGNOSIS — I639 Cerebral infarction, unspecified: Secondary | ICD-10-CM | POA: Diagnosis not present

## 2023-09-07 DIAGNOSIS — I6389 Other cerebral infarction: Secondary | ICD-10-CM

## 2023-09-07 DIAGNOSIS — I6523 Occlusion and stenosis of bilateral carotid arteries: Secondary | ICD-10-CM | POA: Diagnosis not present

## 2023-09-07 LAB — LIPID PANEL
Cholesterol: 154 mg/dL (ref 0–200)
HDL: 24 mg/dL — ABNORMAL LOW (ref >40)
LDL Cholesterol: 88 mg/dL (ref 0–99)
Total CHOL/HDL Ratio: 6.4 {ratio}
Triglycerides: 209 mg/dL — ABNORMAL HIGH (ref <150)
VLDL: 42 mg/dL — ABNORMAL HIGH (ref 0–40)

## 2023-09-07 LAB — BASIC METABOLIC PANEL
Anion gap: 11 (ref 5–15)
BUN: 18 mg/dL (ref 8–23)
CO2: 23 mmol/L (ref 22–32)
Calcium: 8.9 mg/dL (ref 8.9–10.3)
Chloride: 103 mmol/L (ref 98–111)
Creatinine, Ser: 1.63 mg/dL — ABNORMAL HIGH (ref 0.61–1.24)
GFR, Estimated: 41 mL/min — ABNORMAL LOW (ref >60)
Glucose, Bld: 188 mg/dL — ABNORMAL HIGH (ref 70–99)
Potassium: 3.3 mmol/L — ABNORMAL LOW (ref 3.5–5.1)
Sodium: 137 mmol/L (ref 135–145)

## 2023-09-07 LAB — CBC
HCT: 32.8 % — ABNORMAL LOW (ref 39.0–52.0)
Hemoglobin: 10.4 g/dL — ABNORMAL LOW (ref 13.0–17.0)
MCH: 26.9 pg (ref 26.0–34.0)
MCHC: 31.7 g/dL (ref 30.0–36.0)
MCV: 85 fL (ref 80.0–100.0)
Platelets: 213 10*3/uL (ref 150–400)
RBC: 3.86 MIL/uL — ABNORMAL LOW (ref 4.22–5.81)
RDW: 15.6 % — ABNORMAL HIGH (ref 11.5–15.5)
WBC: 8.9 10*3/uL (ref 4.0–10.5)
nRBC: 0 % (ref 0.0–0.2)

## 2023-09-07 LAB — URINE CULTURE

## 2023-09-07 LAB — HEMOGLOBIN A1C
Hgb A1c MFr Bld: 6.4 % — ABNORMAL HIGH (ref 4.8–5.6)
Mean Plasma Glucose: 136.98 mg/dL

## 2023-09-07 LAB — ECHOCARDIOGRAM COMPLETE
AR max vel: 1.82 cm2
AV Area VTI: 1.78 cm2
AV Area mean vel: 1.7 cm2
AV Mean grad: 6 mm[Hg]
AV Peak grad: 9.1 mm[Hg]
Ao pk vel: 1.51 m/s
Area-P 1/2: 3.54 cm2
Height: 71 in
S' Lateral: 4.1 cm

## 2023-09-07 MED ORDER — STERILE WATER FOR INJECTION IJ SOLN
INTRAMUSCULAR | Status: AC
  Filled 2023-09-07: qty 10

## 2023-09-07 MED ORDER — LORAZEPAM 1 MG PO TABS
1.0000 mg | ORAL_TABLET | Freq: Once | ORAL | Status: AC | PRN
  Administered 2023-09-07: 1 mg via ORAL
  Filled 2023-09-07: qty 1

## 2023-09-07 MED ORDER — QUETIAPINE FUMARATE 25 MG PO TABS: 25.0000 mg | ORAL_TABLET | Freq: Every day | ORAL | Status: DC

## 2023-09-07 MED ORDER — STROKE: EARLY STAGES OF RECOVERY BOOK
Freq: Once | Status: AC
  Filled 2023-09-07: qty 1

## 2023-09-07 MED ORDER — INSULIN DETEMIR 100 UNIT/ML ~~LOC~~ SOLN
6.0000 [IU] | Freq: Every day | SUBCUTANEOUS | Status: DC
  Administered 2023-09-07 – 2023-09-09 (×3): 6 [IU] via SUBCUTANEOUS
  Filled 2023-09-07 (×4): qty 0.06

## 2023-09-07 MED ORDER — POTASSIUM CHLORIDE CRYS ER 20 MEQ PO TBCR
40.0000 meq | EXTENDED_RELEASE_TABLET | Freq: Once | ORAL | Status: AC
  Administered 2023-09-07: 40 meq via ORAL
  Filled 2023-09-07: qty 2

## 2023-09-07 MED ORDER — OLANZAPINE 10 MG IM SOLR
2.5000 mg | Freq: Once | INTRAMUSCULAR | Status: AC | PRN
  Administered 2023-09-07: 2.5 mg via INTRAMUSCULAR
  Filled 2023-09-07: qty 10

## 2023-09-07 MED ORDER — MEMANTINE HCL 10 MG PO TABS
10.0000 mg | ORAL_TABLET | Freq: Every day | ORAL | Status: DC
  Administered 2023-09-07 – 2023-09-10 (×4): 10 mg via ORAL
  Filled 2023-09-07 (×4): qty 1

## 2023-09-07 NOTE — Evaluation (Signed)
Clinical/Bedside Swallow Evaluation Patient Details  Name: Duane [name] MRN: [id number] Date of Birth: Dec 18, 1934  Today's Date: 09/07/2023 Time: SLP Start Time (ACUTE ONLY): 1110 SLP Stop Time (ACUTE ONLY): 1120 SLP Time Calculation (min) (ACUTE ONLY): 10 min  Past Medical History:  Past Medical History:  Diagnosis Date   A-fib (HCC)    Anemia    Angina pectoris (HCC)    Arthritis    Carpal tunnel syndrome    Cobalamin deficiency    Coronary artery disease    Diabetes mellitus without complication (HCC)    Eczema    Hyperlipemia    Hypertension    Kidney disease, chronic, stage III (moderate, EGFR 30-59 ml/min) (HCC)    Lung infiltrate    Myocardial infarct, old    Prostatic hypertrophy    Sleep apnea, obstructive    Squamous cell cancer of lip    Past Surgical History:  Past Surgical History:  Procedure Laterality Date   CORONARY ANGIOPLASTY WITH STENT PLACEMENT     POSTERIOR CERVICAL FUSION/FORAMINOTOMY N/A 02/01/2023   Procedure: CERVICAL FIVE - THORACIC ONE LAMINECTOMY, AND INSTRUMENTED FUSION;  Surgeon: Jadene Pierini, MD;  Location: [facility] OR;  Service: Neurosurgery;  Laterality: N/A;   HPI:  Pt is 88 yo presenting from [facility] nursing facility after 3 falls, slurred speech and increased confusion from baseline. MRI shows small acute left corona radiata infarcts. PMH positive for a-fib, arthritis, CAD, DM, Eczema, HTN, dementia, HLD, CKD III, sleep apnea, MI, prostatic hypertrophy, MI, and  C5-C7 Laminectomy and C7-T1 fusion 01/2023.    Assessment / Plan / Recommendation  Clinical Impression  Pt seen for clinical swallowing evaluation. Pt with R lingual weakness and reduced ROM; otherwise, oral motor examination was unremarkable. Pt demonstrated an intact oral swallow. Pharyngeal swallow appeared [facility]. Recommend continuation of a regular diet with thin liquids. Standard aspiration precautions. No further swallowing needs identified at this time.  SLP Visit  Diagnosis: Dysphagia, unspecified (R13.10)    Aspiration Risk  Mild aspiration risk    Diet Recommendation Regular;Thin liquid    Liquid Administration via: Spoon;Cup;Straw Medication Administration: Whole meds with liquid (as tolerated;) Supervision: Patient able to self feed Compensations: Slow rate;Small sips/bites Postural Changes: Seated upright at 90 degrees;Remain upright for at least 30 minutes after po intake    Other  Recommendations Oral Care Recommendations: Oral care BID    Recommendations for follow up therapy are one component of a multi-disciplinary discharge planning process, led by the attending physician.  Recommendations may be updated based on patient status, additional functional criteria and insurance authorization.  Follow up Recommendations None for swallowing        Functional Status Assessment Patient has not had a recent decline in their functional status (for swallowing)         Prognosis Prognosis for improved oropharyngeal function: Good      Swallow Study   General Date of Onset: 09/06/23 HPI: Pt is 88 yo presenting from [facility] nursing facility after 3 falls, slurred speech and increased confusion from baseline. MRI shows small acute left corona radiata infarcts. PMH positive for a-fib, arthritis, CAD, DM, Eczema, HTN, dementia, HLD, CKD III, sleep apnea, MI, prostatic hypertrophy, MI, and  C5-C7 Laminectomy and C7-T1 fusion 01/2023. Type of Study: Bedside Swallow Evaluation Previous Swallow Assessment: clinical swallowing evaluation 04/2021 recommended regular and thin; Passed Swallow Screening this admission Diet Prior to this Study: Regular;Thin liquids (Level 0) Temperature Spikes Noted: No Respiratory Status: Room air History of Recent  Intubation: No Behavior/Cognition: Alert;Cooperative;Pleasant mood;Confused Oral Cavity Assessment: Within Functional Limits Oral Care Completed by SLP: Yes Oral Cavity - Dentition: Adequate natural  dentition Vision: Functional for self-feeding Self-Feeding Abilities: Able to feed self;Needs set up Patient Positioning: Upright in chair Baseline Vocal Quality: Normal Volitional Cough: Strong Volitional Swallow: Able to elicit    Oral/Motor/Sensory Function Overall Oral Motor/Sensory Function: Mild impairment Lingual ROM: Reduced right Lingual Strength: Reduced   Ice Chips Ice chips: Not tested   Thin Liquid Thin Liquid: Within functional limits Presentation: Self Fed;Straw    Nectar Thick Nectar Thick Liquid: Not tested   Honey Thick Honey Thick Liquid: Not tested   Puree Puree: Within functional limits Presentation: Self Fed   Solid     Solid: Within functional limits Presentation: Self Fed     Duane [name], M.S., CCC-SLP Speech-Language Pathologist Secure Chat Preferred  O: [phone]  Alessandra Bevels Tillie Viverette 09/07/2023,11:58 AM

## 2023-09-07 NOTE — TOC Benefit Eligibility Note (Signed)
Patient Product/process development scientist completed.    The patient is insured through [facility]. Patient has Medicare and is not eligible for a copay card, but may be able to apply for patient assistance, if available.    Ran test claim for Nexletol 180 mg and Requires Prior Authorization   This test claim was processed through [employer]- copay amounts may vary at other pharmacies due to [employer], or as the patient moves through the different stages of their insurance plan.     Duane [name], CPHT Pharmacy Technician III Certified Patient Advocate [facility] Patient Advocate Team Direct Number: [phone]  Fax: [phone]

## 2023-09-07 NOTE — Evaluation (Signed)
Occupational Therapy Evaluation Patient Details Name: Duane [name] MRN: [id number] DOB: 02-03-1935 Today's Date: 09/07/2023   History of Present Illness Pt is 88 yo presenting from [facility] nursing facility after 3 falls, slurred speech and increased confusion from baseline. MRI shows small acute left corona radiata infarcts. PMH positive for a-fib, arthritis, CAD, DM, Eczema, HTN, dementia, HLD, CKD III, sleep apnea, MI, prostatic hypertrophy, MI, and  C5-C7 Laminectomy and C7-T1 fusion 01/2023.   Clinical Impression   Pt admitted with the above diagnosis and has the deficits outlined below. Pt would benefit from cont OT to increase independence and safety with basic adls and adl transfers to increase safety at [facility]. Pt is on memory unit at [facility] and overall has assist he needs for adls. Per son, pt has not walked in a couple of weeks. Pt did walk 50 feet to and from bathroom with CG assist.  Pt is safe to return to [employer] but is a fall risk due to his decreased cognitive status and unawareness of deficits. Pt has dementia at baseline so this is not new.  Feel there is still room to increase this pt's independence overall. Will follow acutely.       If plan is discharge home, recommend the following: A little help with walking and/or transfers;A little help with bathing/dressing/bathroom;Assistance with cooking/housework;Direct supervision/assist for medications management;Direct supervision/assist for financial management;Assist for transportation;Help with stairs or ramp for entrance;Supervision due to cognitive status    Functional Status Assessment  Patient has had a recent decline in their functional status and demonstrates the ability to make significant improvements in function in a reasonable and predictable amount of time.  Equipment Recommendations  None recommended by OT    Recommendations for Other Services       Precautions / Restrictions  Precautions Precautions: Fall Restrictions Weight Bearing Restrictions: No      Mobility Bed Mobility               General bed mobility comments: Pt in chair on arrival.    Transfers Overall transfer level: Needs assistance Equipment used: Rolling walker (2 wheels) Transfers: Sit to/from Stand, Bed to chair/wheelchair/BSC Sit to Stand: Contact guard assist     Step pivot transfers: Contact guard assist     General transfer comment: CGA for stability and safety due to pt is unaware of deficits and has slight posterior lean      Balance Overall balance assessment: Needs assistance Sitting-balance support: No upper extremity supported, Feet supported Sitting balance-Leahy Scale: Good     Standing balance support: Bilateral upper extremity supported, During functional activity Standing balance-Leahy Scale: Poor Standing balance comment: During dynamic activities pt requires RW in order to maintain balance in order to decrease risk for falls.                           ADL either performed or assessed with clinical judgement   ADL Overall ADL's : Needs assistance/impaired Eating/Feeding: Independent;Sitting   Grooming: Wash/dry hands;Wash/dry face;Oral care;Set up;Standing Grooming Details (indicate cue type and reason): pt can groom at sink. Staff at [employer] grooms pt but pt able to do for himself. Upper Body Bathing: Set up;Sitting;Cueing for compensatory techniques   Lower Body Bathing: Moderate assistance;Sit to/from stand;Cueing for compensatory techniques   Upper Body Dressing : Set up;Sitting;Cueing for compensatory techniques   Lower Body Dressing: Moderate assistance;Sit to/from stand;Cueing for compensatory techniques   Toilet Transfer:  Minimal assistance;Ambulation;Comfort height toilet;Rolling walker (2 wheels);Grab bars Toilet Transfer Details (indicate cue type and reason): Pt walked to bathroom with CG assist and toileted with CG  assist. Toileting- Clothing Manipulation and Hygiene: Contact guard assist;Sit to/from stand Toileting - Clothing Manipulation Details (indicate cue type and reason): Pt cleaned self in standing with min assist.     Functional mobility during ADLs: Minimal assistance;Rolling walker (2 wheels) General ADL Comments: Pt limited due to decreased safety awareness and decreased balance. Pt requires cues to wait to become balanced prior to walking.     Vision Baseline Vision/History: 1 Wears glasses Ability to See in Adequate Light: 0 Adequate Patient Visual Report: No change from baseline Vision Assessment?: No apparent visual deficits     Perception Perception: Within Functional Limits       Praxis Praxis: WFL       Pertinent Vitals/Pain Pain Assessment Pain Assessment: No/denies pain     Extremity/Trunk Assessment Upper Extremity Assessment Upper Extremity Assessment: Overall WFL for tasks assessed   Lower Extremity Assessment Lower Extremity Assessment: Defer to PT evaluation   Cervical / Trunk Assessment Cervical / Trunk Assessment: Normal   Communication Communication Communication: No apparent difficulties Cueing Techniques: Verbal cues;Gestural cues;Tactile cues;Visual cues   Cognition Arousal: Alert Behavior During Therapy: WFL for tasks assessed/performed   Area of Impairment: Orientation                 Orientation Level: Disoriented to, Place, Situation             General Comments: Pt wanting to walk to bathroom. Son stated pt has not walked since he was moved to new until at [facility].  PT stated pt did walk 100 ft this am so did walk pt to bathroom with min assist.     General Comments  Pt with very little awareness of deficits and has dementia. Pt is not safe to be alone    Exercises     Shoulder Instructions      Home Living Family/patient expects to be discharged to:: Assisted living     Type of Home:  (ALF/memory care)                        Home Equipment: Wheelchair - power;Shower seat;Grab bars - tub/shower;Grab bars - toilet;Rolling Walker (2 wheels)   Additional Comments: ALF: [facility]. Live-in girlfriend. Information gathered from prior notes and neurologist note.      Prior Functioning/Environment Prior Level of Function : Needs assist             Mobility Comments: uses powered w/c, lift recliner ADLs Comments: staff available to assist pt. Pt resistant to bathing and dressing per son but staff helps as needed        OT Problem List: Impaired balance (sitting and/or standing);Decreased safety awareness;Decreased knowledge of precautions;Decreased knowledge of use of DME or AE      OT Treatment/Interventions: Self-care/ADL training;Therapeutic activities;Balance training    OT Goals(Current goals can be found in the care plan section) Acute Rehab OT Goals Patient Stated Goal: to go home OT Goal Formulation: With patient/family Time For Goal Achievement: 09/21/23 Potential to Achieve Goals: Good ADL Goals Pt Will Perform Grooming: with supervision;standing Pt Will Transfer to Toilet: with supervision;ambulating;regular height toilet;grab bars Pt Will Perform Toileting - Clothing Manipulation and hygiene: with supervision;sit to/from stand  OT Frequency: Min 1X/week    Co-evaluation  AM-PAC OT "6 Clicks" Daily Activity     Outcome Measure Help from another person eating meals?: None Help from another person taking care of personal grooming?: A Little Help from another person toileting, which includes using toliet, bedpan, or urinal?: A Little Help from another person bathing (including washing, rinsing, drying)?: A Lot Help from another person to put on and taking off regular upper body clothing?: A Little Help from another person to put on and taking off regular lower body clothing?: A Lot 6 Click Score: 17   End of Session Equipment Utilized During  Treatment: Rolling walker (2 wheels) Nurse Communication: Mobility status  Activity Tolerance: Patient tolerated treatment well Patient left: in chair;with call bell/phone within reach;with family/visitor present;with chair alarm set  OT Visit Diagnosis: Unsteadiness on feet (R26.81)                Time: 6578-4696 OT Time Calculation (min): 20 min Charges:  OT General Charges $OT Visit: 1 Visit OT Evaluation $OT Eval Moderate Complexity: 1 Mod  Hope Budds 09/07/2023, 12:28 PM

## 2023-09-07 NOTE — Progress Notes (Signed)
HD#1 SUBJECTIVE:  Patient Summary: [name] is an 88 yo M with PMH of Afib not anticoagulated, CAD with multiple stents, previous CVA, Dementia, CKD3b, T2DM with neuropathy, C6/7 fracture s/p laminectomy with epidural hematoma s/p evacuation 01/2023 who presented to [facility] on 10/22 with concern of encephalopathy and is admitted for acute stroke and cystitis.   Overnight Events: No overnight events  Interim History: Pt is very pleasant this morning. He has no concerns regarding his health and has no complaints of pain or discomfort. He is preoccupied by his missing wallet and credit card, although as he lives in a memory care facility it is likely that he did not arrive to the hospital with these items.  OBJECTIVE:  Vital Signs: Vitals:   09/06/23 2016 09/06/23 2024 09/07/23 0335 09/07/23 0758  BP:  (!) 143/64 135/89 129/76  Pulse:  73 88 76  Resp:  11 15 17   Temp: 98.9 F (37.2 C) 98.2 F (36.8 C) 97.6 F (36.4 C) 98 F (36.7 C)  TempSrc: Oral Oral Oral Oral  SpO2:  96% 92% 97%   Supplemental O2: Room Air SpO2: 97 %  There were no vitals filed for this visit.   Intake/Output Summary (Last 24 hours) at 09/07/2023 1101 Last data filed at 09/07/2023 0920 Gross per 24 hour  Intake 325 ml  Output 500 ml  Net -175 ml   Net IO Since Admission: -175 mL [09/07/23 1101]  Physical Exam: Physical Exam Constitutional:      General: He is not in acute distress.    Appearance: Normal appearance. He is not ill-appearing.  HENT:     Head: Normocephalic and atraumatic.     Mouth/Throat:     Mouth: Mucous membranes are moist.     Pharynx: Oropharynx is clear.  Eyes:     Extraocular Movements: Extraocular movements intact.     Pupils: Pupils are equal, round, and reactive to light.  Cardiovascular:     Rate and Rhythm: Normal rate and regular rhythm.     Pulses: Normal pulses.  Pulmonary:     Effort: Pulmonary effort is normal.     Breath sounds: Normal breath  sounds.  Abdominal:     General: Abdomen is flat.     Palpations: Abdomen is soft.     Tenderness: There is no abdominal tenderness.  Musculoskeletal:     Right lower leg: Edema present.     Left lower leg: Edema present.     Comments: Trace LE edema bilaterally  Skin:    General: Skin is warm and dry.  Neurological:     General: No focal deficit present.     Mental Status: He is alert. Mental status is at baseline. He is disoriented.     Cranial Nerves: No cranial nerve deficit.     Sensory: No sensory deficit.     Motor: No weakness.     Coordination: Coordination normal.     Comments: Aox2. Disoriented to time which is his baseline.  Psychiatric:        Mood and Affect: Mood normal.        Behavior: Behavior normal.     Patient Lines/Drains/Airways Status     Active Line/Drains/Airways     Name Placement date Placement time Site Days   Peripheral IV 09/06/23 20 G Right Arm 09/06/23  --  Arm  1   External Urinary Catheter 09/06/23  2017  --  1  ASSESSMENT/PLAN:  Assessment: Principal Problem:   Acute cerebral infarction (HCC) Active Problems:   PAF (paroxysmal atrial fibrillation) (HCC)   Diabetes mellitus type 2, controlled (HCC)   UTI (urinary tract infection)   CKD (chronic kidney disease), stage III (HCC)   Vascular dementia, mild, with mood disturbance (HCC)   Duane [name] is a 88 y.o. male with pertinent PMH of Afib not anticoagulated, CAD with multiple stents, previous CVA, and dementia who presented with altered mental status and is admitted for encephalopathy in setting of acute cerebral infarction and cystitis.   Acute Cerebral Infarction This is a who has dysarthria x 1 week and progressed to somnolence and recent falls at his memory care facility. Per MRI: there are multiple acute infarcts of the L corona radiata. He has been at his approximate cognitive baseline since admission, which entails significant trouble with short term  memory and no orientation to time (but he is oriented to person and place). There continue to be no appreciable deficits on exam. At this point, he his hospitalized for stroke workup and risk factor modification. - Neurology is on board, appreciate it.  - Echo/carotid duplexes pending - MR angio head/neck with multiple areas of stenosis - SLP eval passed, he has a diet - PT/OT eval pending - Lipid panel not bad = normal cholesterol, low HDL, high TGs. He is intolerant to statins. - Hg A1c = 6.4   Cystitis Continuing to treat infection per enephalopathy. He is not complaining of other symptoms including dysuria, frequency, hematuria, suprapubic pain. Creatinine shows a mild elevation but he is grossly near his baseline with CKD3b. Up until one month ago, he had a urinary catheter in place due to his recent neck trauma in March, but do not feel that is contributing to his current infection. - Continue Keflex 500mg  - Cultures pending   Paroxysmal Afib Pt's rhythm is regular and there is no tachycardia or hemodynamic instability. Anticoagulation was stopped in March likely in context of his neck injury that required cervical fusion as well as an epidural hematoma. He remains on clopidogrel, with facility records suggesting he is on the medicine is for his Afib. We restarted Eliquis at reduced dose as he will be in hospital but will initiate discussion about risks/benefits of long-term anticoagulation given his many recent falls. - continue Eliquis 2.5mg  BID, will discuss out-of-hospital anticoagulation with his full care team  - continue home metoprolol succinate 100 BID   Dementia At baseline, currently very pleasant. There was some report of agitation overnight. Aox2 (not oriented to time). Significant short term memory deficits. Pt is a poor historian. - continue home memantine 10 in am and 5 pm.   Hypokalemia 3.3 this morning, will replete.  Chronic Stable Issues   Chronic Anemia Stable.  10.4 today, labs from her perioperative period had Hg 8-9. Not complaining of dyspnea, fatigue. Will monitor.   CKD3b At baseline 1.63. Will monitor in pt with UTI.   HTN - continue home amlodipine 5mg    CAD - continue home clopidogrel 75mg    Depression/anxiety - continue home lexapro 10mg    BPH - continue home flomax   T2DM with neuropathy - Takes insulin detemir 6u daily. Will check A1c, most recent appears 7.5.  Best Practice: Diet: Regular diet IVF: None VTE: apixaban (ELIQUIS) tablet 2.5 mg Start: 09/06/23 2200 Code: Full AB: Keflex for UTI Therapy Recs: pending Family Contact: Angie Curlle [phone] DISPO: Anticipated discharge in 1-3 days pending medical management.  Signature: Cristal Deer  Adaijah Endres, D.O.  Internal Medicine Resident, PGY-1 [facility]  Pager: [phone] 11:01 AM, 09/07/2023   Please contact the on call pager after 5 pm and on weekends at [phone].

## 2023-09-07 NOTE — Evaluation (Signed)
Physical Therapy Evaluation Patient Details Name: Duane [name] MRN: [id number] DOB: 1935/06/03 Today's Date: 09/07/2023  History of Present Illness  Pt is 88 yo presenting from [facility] nursing facility after 3 falls, slurred speech and increased confusion from baseline. MRI shows small acute left corona radiata infarcts. PMH positive for a-fib, arthritis, CAD, DM, Eczema, HTN, dementia, HLD, CKD III, sleep apnea, MI, prostatic hypertrophy, MI, and  C5-C7 Laminectomy and C7-T1 fusion 01/2023.  Clinical Impression  Pt is presenting below baseline level of functioning. Prior to hospitalization pt was Mod I for short distance gait, sit to stand and transfers. Currently pt is CGA due to instability and impaired safety awareness. Pt tried to stand quickly 2x prior to standing at [facility] with difficulty rising secondary to weakness in bil LE. Ultimately pt did not require physical assist to stand. On discharge pt will require 24/7 supervision for safety in order to prevent falls. Due to pt current functional status, home set up and available assistance recommending skilled physical therapy services 3x/weekly on discharge from acute care hospital setting in order to decrease risk for falls, injury and re-hospitalization. Pt tolerated treatment session well.         If plan is discharge home, recommend the following: Supervision due to cognitive status;Assistance with cooking/housework;Assist for transportation;A little help with walking and/or transfers     Equipment Recommendations None recommended by PT;Other (comment) (pt has equipment)     Functional Status Assessment Patient has had a recent decline in their functional status and demonstrates the ability to make significant improvements in function in a reasonable and predictable amount of time.     Precautions / Restrictions Precautions Precautions: Fall Restrictions Weight Bearing Restrictions: No      Mobility  Bed Mobility Overal bed  mobility: Needs Assistance Bed Mobility: Supine to Sit     Supine to sit: Contact guard, HOB elevated     General bed mobility comments: minimal use of bed rails and intermittent verbal cues to scoot to Edge of bed.    Transfers Overall transfer level: Needs assistance Equipment used: Rolling walker (2 wheels) Transfers: Sit to/from Stand, Bed to chair/wheelchair/BSC Sit to Stand: Contact guard assist   Step pivot transfers: Contact guard assist       General transfer comment: CGA for stability and safety due to pt is unaware of deficits.    Ambulation/Gait Ambulation/Gait assistance: Contact guard assist Gait Distance (Feet): 75 Feet Assistive device: Rolling walker (2 wheels) Gait Pattern/deviations: Step-through pattern, Decreased stride length Gait velocity: decreased Gait velocity interpretation: <1.8 ft/sec, indicate of risk for recurrent falls   General Gait Details: Pt requires intermittent verbal and physical cues to maintain body habitus close to RW and to stay within limits of RW for support to decrease risk for falls especially during turns and pt requires intermittent verbal cues to stop running into objects with RW. No preference or neglect noted.  Stairs Stairs:  (not applicable)           Modified Rankin (Stroke Patients Only) Modified Rankin (Stroke Patients Only) Pre-Morbid Rankin Score: Slight disability Modified Rankin: Moderate disability     Balance Overall balance assessment: Needs assistance Sitting-balance support: No upper extremity supported, Feet supported Sitting balance-Leahy Scale: Good     Standing balance support: Bilateral upper extremity supported, During functional activity Standing balance-Leahy Scale: Poor Standing balance comment: During dynamic activities pt requires RW in order to maintain balance in order to decrease risk for falls.  Pertinent Vitals/Pain Pain Assessment Pain Assessment: No/denies pain     Home Living Family/patient expects to be discharged to:: Assisted living     Home Equipment: Wheelchair - power;Shower seat;Grab bars - tub/shower;Grab bars - toilet;Rolling Walker (2 wheels) Additional Comments: ALF: [facility]. Live-in girlfriend. Information gathered from prior notes and neurologist note.    Prior Function Prior Level of Function : Needs assist             Mobility Comments: uses powered w/c, lift recliner ADLs Comments: supervision with bathing     Extremity/Trunk Assessment   Upper Extremity Assessment Upper Extremity Assessment: Defer to OT evaluation    Lower Extremity Assessment Lower Extremity Assessment: Generalized weakness    Cervical / Trunk Assessment Cervical / Trunk Assessment: Normal  Communication   Communication Communication: No apparent difficulties Cueing Techniques: Verbal cues;Gestural cues;Tactile cues;Visual cues  Cognition Arousal: Alert Behavior During Therapy: WFL for tasks assessed/performed Overall Cognitive Status: Impaired/Different from baseline Area of Impairment: Orientation       Orientation Level: Disoriented to, Place, Situation             General Comments: Pt was perseverating on the office and returning back to work. Responded okay to re-direction        General Comments General comments (skin integrity, edema, etc.): No noted skin issues.        Assessment/Plan    PT Assessment Patient needs continued PT services  PT Problem List Decreased mobility;Decreased balance;Decreased safety awareness       PT Treatment Interventions DME instruction;Therapeutic exercise;Gait training;Balance training;Neuromuscular re-education;Functional mobility training;Therapeutic activities;Patient/family education    PT Goals (Current goals can be found in the Care Plan section)  Acute Rehab PT Goals Patient Stated Goal: to return home and to get back to work PT Goal Formulation: With patient Time  For Goal Achievement: 09/21/23 Potential to Achieve Goals: Fair    Frequency Min 1X/week        AM-PAC PT "6 Clicks" Mobility  Outcome Measure Help needed turning from your back to your side while in a flat bed without using bedrails?: A Little Help needed moving from lying on your back to sitting on the side of a flat bed without using bedrails?: A Little Help needed moving to and from a bed to a chair (including a wheelchair)?: A Little Help needed standing up from a chair using your arms (e.g., wheelchair or bedside chair)?: A Little Help needed to walk in hospital room?: A Little Help needed climbing 3-5 steps with a railing? : A Little 6 Click Score: 18    End of Session Equipment Utilized During Treatment: Gait belt Activity Tolerance: Patient tolerated treatment well Patient left: in chair;with call bell/phone within reach;with chair alarm set Nurse Communication: Mobility status PT Visit Diagnosis: Other abnormalities of gait and mobility (R26.89);Unsteadiness on feet (R26.81)    Time: 1030-1059 PT Time Calculation (min) (ACUTE ONLY): 29 min   Charges:   PT Evaluation $PT Eval Low Complexity: 1 Low PT Treatments $Therapeutic Activity: 8-22 mins PT General Charges $$ ACUTE PT VISIT: 1 Visit        Harrel Carina, DPT, CLT  Acute Rehabilitation Services Office: [phone] (Secure chat preferred)   Claudia Desanctis 09/07/2023, 11:37 AM

## 2023-09-07 NOTE — Care Management CC44 (Signed)
Condition Code 44 Documentation Completed  Patient Details  Name: Duane [name] MRN: [id number] Date of Birth: 1935/10/13   Condition Code 44 given:  Yes Patient signature on Condition Code 44 notice:  Yes Documentation of 2 MD's agreement:  Yes Code 44 added to claim:  Yes    Michel Bickers, RN 09/07/2023, 7:00 PM

## 2023-09-07 NOTE — Plan of Care (Signed)
  Problem: Education: Goal: Knowledge of General Education information will improve Description: Including pain rating scale, medication(s)/side effects and non-pharmacologic comfort measures Outcome: Not Progressing   Problem: Health Behavior/Discharge Planning: Goal: Ability to manage health-related needs will improve Outcome: Not Progressing   Problem: Clinical Measurements: Goal: Ability to maintain clinical measurements within normal limits will improve Outcome: Progressing Goal: Will remain free from infection Outcome: Progressing Goal: Diagnostic test results will improve Outcome: Progressing Goal: Respiratory complications will improve Outcome: Progressing Goal: Cardiovascular complication will be avoided Outcome: Progressing   Problem: Activity: Goal: Risk for activity intolerance will decrease Outcome: Progressing   Problem: Nutrition: Goal: Adequate nutrition will be maintained Outcome: Progressing   Problem: Coping: Goal: Level of anxiety will decrease Outcome: Not Progressing   Problem: Elimination: Goal: Will not experience complications related to bowel motility Outcome: Progressing Goal: Will not experience complications related to urinary retention Outcome: Progressing   Problem: Pain Management: Goal: General experience of comfort will improve Outcome: Progressing   Problem: Safety: Goal: Ability to remain free from injury will improve Outcome: Progressing   Problem: Skin Integrity: Goal: Risk for impaired skin integrity will decrease Outcome: Progressing

## 2023-09-07 NOTE — Care Management Obs Status (Signed)
MEDICARE OBSERVATION STATUS NOTIFICATION   Patient Details  Name: Duane [name] MRN: [id number] Date of Birth: May 24, 1935   Medicare Observation Status Notification Given:   yes    Michel Bickers, RN 09/07/2023, 6:59 PM
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
## 2023-09-07 NOTE — Progress Notes (Addendum)
AORTIC VALVE AV Area (Vmax):    1.82 cm AV Area (Vmean):   1.70 cm AV Area (VTI):     1.78 cm AV Vmax:           151.00 cm/s AV Vmean:          121.000 cm/s AV VTI:            0.388 m AV Peak Grad:      9.1 mmHg AV Mean Grad:      6.0 mmHg LVOT Vmax:         60.90 cm/s LVOT Vmean:        45.600 cm/s LVOT VTI:          0.153 m LVOT/AV VTI ratio: 0.39 MITRAL VALVE MV Area (PHT): 3.54 cm     SHUNTS MV Decel Time: 214 msec     Systemic VTI:  0.15 m MV E velocity: 96.40 cm/s   Systemic Diam: 2.40 cm MV A velocity: 114.00 cm/s MV E/A ratio:  0.85 Duane [name] Electronically signed by Dorthula Nettles Signature Date/Time: 09/07/2023/3:56:20 PM    Final    VAS US CAROTID  Result Date: 09/07/2023 Carotid Arterial Duplex Study Patient Name:  Duane [name]  Date of Exam:   09/07/2023 Medical Rec #: [id number]        Accession #:    [id number] Date of Birth: 01-11-1935        Patient Gender: M Patient Age:   76 years Exam Location:  [facility] Procedure:      VAS US CAROTID Referring Phys: Terrilee Files [facility] --------------------------------------------------------------------------------  Indications:       Multiple small acute infarcts in the left corona radiata.                    Slurred speech, weakness'. Risk Factors:      Hypertension, hyperlipidemia, Diabetes. Comparison Study:  none Performing Technologist: Jeb Levering RDMS, RVT  Examination Guidelines: A complete evaluation includes B-mode imaging, spectral Doppler, color Doppler, and power Doppler as needed of all accessible portions of each vessel. Bilateral testing is considered an integral part of a complete examination. Limited examinations for reoccurring indications may be performed as noted.  Right Carotid Findings: +----------+--------+--------+--------+------------------+--------+           PSV cm/sEDV cm/sStenosisPlaque DescriptionComments  +----------+--------+--------+--------+------------------+--------+ CCA Prox  60      13                                         +----------+--------+--------+--------+------------------+--------+ CCA Distal74      10                                         +----------+--------+--------+--------+------------------+--------+ ICA Prox  42      15      1-39%                              +----------+--------+--------+--------+------------------+--------+ ICA Distal88      20                                         +----------+--------+--------+--------+------------------+--------+ ECA  AORTIC VALVE AV Area (Vmax):    1.82 cm AV Area (Vmean):   1.70 cm AV Area (VTI):     1.78 cm AV Vmax:           151.00 cm/s AV Vmean:          121.000 cm/s AV VTI:            0.388 m AV Peak Grad:      9.1 mmHg AV Mean Grad:      6.0 mmHg LVOT Vmax:         60.90 cm/s LVOT Vmean:        45.600 cm/s LVOT VTI:          0.153 m LVOT/AV VTI ratio: 0.39 MITRAL VALVE MV Area (PHT): 3.54 cm     SHUNTS MV Decel Time: 214 msec     Systemic VTI:  0.15 m MV E velocity: 96.40 cm/s   Systemic Diam: 2.40 cm MV A velocity: 114.00 cm/s MV E/A ratio:  0.85 Duane [name] Electronically signed by Dorthula Nettles Signature Date/Time: 09/07/2023/3:56:20 PM    Final    VAS US CAROTID  Result Date: 09/07/2023 Carotid Arterial Duplex Study Patient Name:  Duane [name]  Date of Exam:   09/07/2023 Medical Rec #: [id number]        Accession #:    [id number] Date of Birth: 01-11-1935        Patient Gender: M Patient Age:   76 years Exam Location:  [facility] Procedure:      VAS US CAROTID Referring Phys: Terrilee Files [facility] --------------------------------------------------------------------------------  Indications:       Multiple small acute infarcts in the left corona radiata.                    Slurred speech, weakness'. Risk Factors:      Hypertension, hyperlipidemia, Diabetes. Comparison Study:  none Performing Technologist: Jeb Levering RDMS, RVT  Examination Guidelines: A complete evaluation includes B-mode imaging, spectral Doppler, color Doppler, and power Doppler as needed of all accessible portions of each vessel. Bilateral testing is considered an integral part of a complete examination. Limited examinations for reoccurring indications may be performed as noted.  Right Carotid Findings: +----------+--------+--------+--------+------------------+--------+           PSV cm/sEDV cm/sStenosisPlaque DescriptionComments  +----------+--------+--------+--------+------------------+--------+ CCA Prox  60      13                                         +----------+--------+--------+--------+------------------+--------+ CCA Distal74      10                                         +----------+--------+--------+--------+------------------+--------+ ICA Prox  42      15      1-39%                              +----------+--------+--------+--------+------------------+--------+ ICA Distal88      20                                         +----------+--------+--------+--------+------------------+--------+ ECA  STROKE TEAM PROGRESS NOTE   BRIEF HPI Duane [name] is a 88 y.o. male with history of atrial fibrillation, remote CVA without residual deficit, type 2 diabetes mellitus, CAD, HTN, HLD, CKD stage III, remote MI, OSA, recent presentation in March of 2024 for a C6-7 fracture requiring stabilization and epidural hematoma requiring evacuation who presented to the ED from his nursing facility after sustaining 3 falls overnight with slurred speech and increased confusion from baseline after close his morning.   INTERIM HISTORY/SUBJECTIVE Patient is sitting up in the chair, son is at the bedside.  Patient is having some right hand weakness with slowed RAM.  He is able to tell me that he lives in a nursing facility and identify family members in the room.  Additionally he is able to tell me that he feels confused and that he knows he is in the hospital.  He is disoriented to month and day, but is able to tell me it is 2024.  He is currently on Eliquis and Plavix.     OBJECTIVE  CBC    Component Value Date/Time   WBC 8.9 09/07/2023 0838   RBC 3.86 (L) 09/07/2023 0838   HGB 10.4 (L) 09/07/2023 0838   HCT 32.8 (L) 09/07/2023 0838   PLT 213 09/07/2023 0838   MCV 85.0 09/07/2023 0838   MCH 26.9 09/07/2023 0838   MCHC 31.7 09/07/2023 0838   RDW 15.6 (H) 09/07/2023 0838   LYMPHSABS 3.7 02/01/2023 1316   MONOABS 1.1 (H) 02/01/2023 1316   EOSABS 0.3 02/01/2023 1316   BASOSABS 0.1 02/01/2023 1316    BMET    Component Value Date/Time   NA 137 09/07/2023 0838   K 3.3 (L) 09/07/2023 0838   CL 103 09/07/2023 0838   CO2 23 09/07/2023 0838   GLUCOSE 188 (H) 09/07/2023 0838   BUN 18 09/07/2023 0838   CREATININE 1.63 (H) 09/07/2023 0838   CALCIUM 8.9 09/07/2023 0838   GFRNONAA 41 (L) 09/07/2023 0838    IMAGING past 24 hours ECHOCARDIOGRAM COMPLETE  Result Date: 09/07/2023    ECHOCARDIOGRAM REPORT   Patient Name:   TALIQ CHILCOAT Date of Exam: 09/07/2023 Medical Rec #:  [id number]        Height:       71.0 in Accession #:    [id number]      Weight:       208.3 lb Date of Birth:  01-29-1935       BSA:          2.145 m Patient Age:    88 years        BP:           130/86 mmHg Patient Gender: M               HR:           70 bpm. Exam Location:  Inpatient Procedure: 2D Echo, Cardiac Doppler and Color Doppler Indications:    Stroke  History:        Patient has prior history of Echocardiogram examinations, most                 recent 05/12/2021. Stroke, Arrythmias:Atrial Fibrillation; Risk                 Factors:Hypertension, Diabetes, Dyslipidemia and Sleep Apnea.  Sonographer:    Melton Krebs RDCS, FE, PE Referring Phys: [id number] [facility] IMPRESSIONS  1. Left ventricular ejection fraction, by estimation, is 50 to 55%. The  AORTIC VALVE AV Area (Vmax):    1.82 cm AV Area (Vmean):   1.70 cm AV Area (VTI):     1.78 cm AV Vmax:           151.00 cm/s AV Vmean:          121.000 cm/s AV VTI:            0.388 m AV Peak Grad:      9.1 mmHg AV Mean Grad:      6.0 mmHg LVOT Vmax:         60.90 cm/s LVOT Vmean:        45.600 cm/s LVOT VTI:          0.153 m LVOT/AV VTI ratio: 0.39 MITRAL VALVE MV Area (PHT): 3.54 cm     SHUNTS MV Decel Time: 214 msec     Systemic VTI:  0.15 m MV E velocity: 96.40 cm/s   Systemic Diam: 2.40 cm MV A velocity: 114.00 cm/s MV E/A ratio:  0.85 Duane [name] Electronically signed by Dorthula Nettles Signature Date/Time: 09/07/2023/3:56:20 PM    Final    VAS US CAROTID  Result Date: 09/07/2023 Carotid Arterial Duplex Study Patient Name:  Duane [name]  Date of Exam:   09/07/2023 Medical Rec #: [id number]        Accession #:    [id number] Date of Birth: 01-11-1935        Patient Gender: M Patient Age:   76 years Exam Location:  [facility] Procedure:      VAS US CAROTID Referring Phys: Terrilee Files [facility] --------------------------------------------------------------------------------  Indications:       Multiple small acute infarcts in the left corona radiata.                    Slurred speech, weakness'. Risk Factors:      Hypertension, hyperlipidemia, Diabetes. Comparison Study:  none Performing Technologist: Jeb Levering RDMS, RVT  Examination Guidelines: A complete evaluation includes B-mode imaging, spectral Doppler, color Doppler, and power Doppler as needed of all accessible portions of each vessel. Bilateral testing is considered an integral part of a complete examination. Limited examinations for reoccurring indications may be performed as noted.  Right Carotid Findings: +----------+--------+--------+--------+------------------+--------+           PSV cm/sEDV cm/sStenosisPlaque DescriptionComments  +----------+--------+--------+--------+------------------+--------+ CCA Prox  60      13                                         +----------+--------+--------+--------+------------------+--------+ CCA Distal74      10                                         +----------+--------+--------+--------+------------------+--------+ ICA Prox  42      15      1-39%                              +----------+--------+--------+--------+------------------+--------+ ICA Distal88      20                                         +----------+--------+--------+--------+------------------+--------+ ECA  STROKE TEAM PROGRESS NOTE   BRIEF HPI Duane [name] is a 88 y.o. male with history of atrial fibrillation, remote CVA without residual deficit, type 2 diabetes mellitus, CAD, HTN, HLD, CKD stage III, remote MI, OSA, recent presentation in March of 2024 for a C6-7 fracture requiring stabilization and epidural hematoma requiring evacuation who presented to the ED from his nursing facility after sustaining 3 falls overnight with slurred speech and increased confusion from baseline after close his morning.   INTERIM HISTORY/SUBJECTIVE Patient is sitting up in the chair, son is at the bedside.  Patient is having some right hand weakness with slowed RAM.  He is able to tell me that he lives in a nursing facility and identify family members in the room.  Additionally he is able to tell me that he feels confused and that he knows he is in the hospital.  He is disoriented to month and day, but is able to tell me it is 2024.  He is currently on Eliquis and Plavix.     OBJECTIVE  CBC    Component Value Date/Time   WBC 8.9 09/07/2023 0838   RBC 3.86 (L) 09/07/2023 0838   HGB 10.4 (L) 09/07/2023 0838   HCT 32.8 (L) 09/07/2023 0838   PLT 213 09/07/2023 0838   MCV 85.0 09/07/2023 0838   MCH 26.9 09/07/2023 0838   MCHC 31.7 09/07/2023 0838   RDW 15.6 (H) 09/07/2023 0838   LYMPHSABS 3.7 02/01/2023 1316   MONOABS 1.1 (H) 02/01/2023 1316   EOSABS 0.3 02/01/2023 1316   BASOSABS 0.1 02/01/2023 1316    BMET    Component Value Date/Time   NA 137 09/07/2023 0838   K 3.3 (L) 09/07/2023 0838   CL 103 09/07/2023 0838   CO2 23 09/07/2023 0838   GLUCOSE 188 (H) 09/07/2023 0838   BUN 18 09/07/2023 0838   CREATININE 1.63 (H) 09/07/2023 0838   CALCIUM 8.9 09/07/2023 0838   GFRNONAA 41 (L) 09/07/2023 0838    IMAGING past 24 hours ECHOCARDIOGRAM COMPLETE  Result Date: 09/07/2023    ECHOCARDIOGRAM REPORT   Patient Name:   TALIQ CHILCOAT Date of Exam: 09/07/2023 Medical Rec #:  [id number]        Height:       71.0 in Accession #:    [id number]      Weight:       208.3 lb Date of Birth:  01-29-1935       BSA:          2.145 m Patient Age:    88 years        BP:           130/86 mmHg Patient Gender: M               HR:           70 bpm. Exam Location:  Inpatient Procedure: 2D Echo, Cardiac Doppler and Color Doppler Indications:    Stroke  History:        Patient has prior history of Echocardiogram examinations, most                 recent 05/12/2021. Stroke, Arrythmias:Atrial Fibrillation; Risk                 Factors:Hypertension, Diabetes, Dyslipidemia and Sleep Apnea.  Sonographer:    Melton Krebs RDCS, FE, PE Referring Phys: [id number] [facility] IMPRESSIONS  1. Left ventricular ejection fraction, by estimation, is 50 to 55%. The  AORTIC VALVE AV Area (Vmax):    1.82 cm AV Area (Vmean):   1.70 cm AV Area (VTI):     1.78 cm AV Vmax:           151.00 cm/s AV Vmean:          121.000 cm/s AV VTI:            0.388 m AV Peak Grad:      9.1 mmHg AV Mean Grad:      6.0 mmHg LVOT Vmax:         60.90 cm/s LVOT Vmean:        45.600 cm/s LVOT VTI:          0.153 m LVOT/AV VTI ratio: 0.39 MITRAL VALVE MV Area (PHT): 3.54 cm     SHUNTS MV Decel Time: 214 msec     Systemic VTI:  0.15 m MV E velocity: 96.40 cm/s   Systemic Diam: 2.40 cm MV A velocity: 114.00 cm/s MV E/A ratio:  0.85 Duane [name] Electronically signed by Dorthula Nettles Signature Date/Time: 09/07/2023/3:56:20 PM    Final    VAS US CAROTID  Result Date: 09/07/2023 Carotid Arterial Duplex Study Patient Name:  Duane [name]  Date of Exam:   09/07/2023 Medical Rec #: [id number]        Accession #:    [id number] Date of Birth: 01-11-1935        Patient Gender: M Patient Age:   76 years Exam Location:  [facility] Procedure:      VAS US CAROTID Referring Phys: Terrilee Files [facility] --------------------------------------------------------------------------------  Indications:       Multiple small acute infarcts in the left corona radiata.                    Slurred speech, weakness'. Risk Factors:      Hypertension, hyperlipidemia, Diabetes. Comparison Study:  none Performing Technologist: Jeb Levering RDMS, RVT  Examination Guidelines: A complete evaluation includes B-mode imaging, spectral Doppler, color Doppler, and power Doppler as needed of all accessible portions of each vessel. Bilateral testing is considered an integral part of a complete examination. Limited examinations for reoccurring indications may be performed as noted.  Right Carotid Findings: +----------+--------+--------+--------+------------------+--------+           PSV cm/sEDV cm/sStenosisPlaque DescriptionComments  +----------+--------+--------+--------+------------------+--------+ CCA Prox  60      13                                         +----------+--------+--------+--------+------------------+--------+ CCA Distal74      10                                         +----------+--------+--------+--------+------------------+--------+ ICA Prox  42      15      1-39%                              +----------+--------+--------+--------+------------------+--------+ ICA Distal88      20                                         +----------+--------+--------+--------+------------------+--------+ ECA  AORTIC VALVE AV Area (Vmax):    1.82 cm AV Area (Vmean):   1.70 cm AV Area (VTI):     1.78 cm AV Vmax:           151.00 cm/s AV Vmean:          121.000 cm/s AV VTI:            0.388 m AV Peak Grad:      9.1 mmHg AV Mean Grad:      6.0 mmHg LVOT Vmax:         60.90 cm/s LVOT Vmean:        45.600 cm/s LVOT VTI:          0.153 m LVOT/AV VTI ratio: 0.39 MITRAL VALVE MV Area (PHT): 3.54 cm     SHUNTS MV Decel Time: 214 msec     Systemic VTI:  0.15 m MV E velocity: 96.40 cm/s   Systemic Diam: 2.40 cm MV A velocity: 114.00 cm/s MV E/A ratio:  0.85 Duane [name] Electronically signed by Dorthula Nettles Signature Date/Time: 09/07/2023/3:56:20 PM    Final    VAS US CAROTID  Result Date: 09/07/2023 Carotid Arterial Duplex Study Patient Name:  Duane [name]  Date of Exam:   09/07/2023 Medical Rec #: [id number]        Accession #:    [id number] Date of Birth: 01-11-1935        Patient Gender: M Patient Age:   76 years Exam Location:  [facility] Procedure:      VAS US CAROTID Referring Phys: Terrilee Files [facility] --------------------------------------------------------------------------------  Indications:       Multiple small acute infarcts in the left corona radiata.                    Slurred speech, weakness'. Risk Factors:      Hypertension, hyperlipidemia, Diabetes. Comparison Study:  none Performing Technologist: Jeb Levering RDMS, RVT  Examination Guidelines: A complete evaluation includes B-mode imaging, spectral Doppler, color Doppler, and power Doppler as needed of all accessible portions of each vessel. Bilateral testing is considered an integral part of a complete examination. Limited examinations for reoccurring indications may be performed as noted.  Right Carotid Findings: +----------+--------+--------+--------+------------------+--------+           PSV cm/sEDV cm/sStenosisPlaque DescriptionComments  +----------+--------+--------+--------+------------------+--------+ CCA Prox  60      13                                         +----------+--------+--------+--------+------------------+--------+ CCA Distal74      10                                         +----------+--------+--------+--------+------------------+--------+ ICA Prox  42      15      1-39%                              +----------+--------+--------+--------+------------------+--------+ ICA Distal88      20                                         +----------+--------+--------+--------+------------------+--------+ ECA

## 2023-09-07 NOTE — Plan of Care (Signed)
  Problem: Health Behavior/Discharge Planning: Goal: Ability to manage health-related needs will improve Outcome: Progressing   Problem: Clinical Measurements: Goal: Will remain free from infection Outcome: Progressing   Problem: Activity: Goal: Risk for activity intolerance will decrease Outcome: Progressing   Problem: Nutrition: Goal: Adequate nutrition will be maintained Outcome: Progressing   Problem: Coping: Goal: Level of anxiety will decrease Outcome: Progressing   Problem: Elimination: Goal: Will not experience complications related to bowel motility Outcome: Progressing   Problem: Safety: Goal: Ability to remain free from injury will improve Outcome: Progressing
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
## 2023-09-07 NOTE — Evaluation (Signed)
Speech Language Pathology Evaluation Patient Details Name: Duane [name] MRN: [id number] DOB: [birth year] Today's Date: 09/07/2023 Time: 2952-8413 SLP Time Calculation (min) (ACUTE ONLY): 13 min  Problem List:  Patient Active Problem List   Diagnosis Date Noted   Acute cerebral infarction (HCC) 09/06/2023   Closed cervical spine fracture (HCC) 02/02/2023   Status post surgery 02/01/2023   OSA (obstructive sleep apnea)    Vascular dementia, mild, with mood disturbance (HCC) 08/15/2021   Overweight (BMI 25.0-29.9) 05/12/2021   History of CVA (cerebrovascular accident) 05/11/2021   CKD (chronic kidney disease), stage III (HCC) 05/11/2021   Hyperlipidemia 05/11/2021   Hyponatremia 09/05/2019   Diarrhea 09/05/2019   UTI (urinary tract infection) 09/05/2019   Encephalopathy 07/23/2019   Essential hypertension 09/08/2016   PAF (paroxysmal atrial fibrillation) (HCC) 09/08/2016   Diabetes mellitus type 2, controlled (HCC) 09/08/2016   ARF (acute renal failure) (HCC) 09/08/2016   Normochromic normocytic anemia 09/08/2016   Abdominal pain 09/08/2016   Past Medical History:  Past Medical History:  Diagnosis Date   A-fib (HCC)    Anemia    Angina pectoris (HCC)    Arthritis    Carpal tunnel syndrome    Cobalamin deficiency    Coronary artery disease    Diabetes mellitus without complication (HCC)    Eczema    Hyperlipemia    Hypertension    Kidney disease, chronic, stage III (moderate, EGFR 30-59 ml/min) (HCC)    Lung infiltrate    Myocardial infarct, old    Prostatic hypertrophy    Sleep apnea, obstructive    Squamous cell cancer of lip    Past Surgical History:  Past Surgical History:  Procedure Laterality Date   CORONARY ANGIOPLASTY WITH STENT PLACEMENT     POSTERIOR CERVICAL FUSION/FORAMINOTOMY N/A 02/01/2023   Procedure: CERVICAL FIVE - THORACIC ONE LAMINECTOMY, AND INSTRUMENTED FUSION;  Surgeon: Jadene Pierini, MD;  Location: [facility] OR;  Service: Neurosurgery;   Laterality: N/A;   HPI:  Pt is 88 yo presenting from [facility] nursing facility after 3 falls, slurred speech and increased confusion from baseline. MRI shows small acute left corona radiata infarcts. PMH positive for a-fib, arthritis, CAD, DM, Eczema, HTN, dementia, HLD, CKD III, sleep apnea, MI, prostatic hypertrophy, MI, and  C5-C7 Laminectomy and C7-T1 fusion 01/2023.   Assessment / Plan / Recommendation Clinical Impression  Pt seen for cognitive-communication evaluation. Pt received upright in recliner. Pt alert, pleasantly confused. Perseverative verbal output noted, "I got up and drove my car." Assessment completed via informal means. Pt presents with cognitive-linguistic deficits affecting attention, memory, problem solving, and insight. Pt's speech is c/b articulatory imprecision across all levels with mild impact on speech intelligibility. Pt stimulable to cueing for speech intelligibility strategies, ?ability to carry-over given memory deficits. Pt with occasional anomia appreciated during informal conversational exchanges with inconsistent ability to repair. Pt with seemingly intact basic auditory comprehension. Noted pt with hx of dementia. Per H&P, "His family is at bedside and do not feel that he remains confused beyond his baseline (which includes great trouble with short-term memory)." Family not present to provide additional details of PLOF. Pt resides in ALF/memory care unit at [facility]. Recommend SLP services at next level of care if pt is not at baseline level of functioning.    SLP Assessment  SLP Recommendation/Assessment: All further Speech Lanaguage Pathology  needs can be addressed in the next venue of care SLP Visit Diagnosis: Cognitive communication deficit (R41.841);Dysarthria and anarthria (R47.1)    Recommendations  for follow up therapy are one component of a multi-disciplinary discharge planning process, led by the attending physician.  Recommendations may be  updated based on patient status, additional functional criteria and insurance authorization.    Follow Up Recommendations  Follow physician's recommendations for discharge plan and follow up therapies (post-acute SLP services in alignment with OT/PT recommendations)    Assistance Recommended at Discharge  Frequent or constant Supervision/Assistance  Functional Status Assessment Patient has had a recent decline in their functional status and demonstrates the ability to make significant improvements in function in a reasonable and predictable amount of time.        SLP Evaluation Cognition  Overall Cognitive Status: Impaired/Different from baseline Arousal/Alertness: Awake/alert Orientation Level: Oriented to person;Disoriented to time;Disoriented to place;Disoriented to situation Attention: Sustained Memory: Impaired Memory Impairment: Storage deficit;Retrieval deficit;Decreased recall of new information;Decreased short term memory;Prospective memory Awareness: Impaired Awareness Impairment: Intellectual impairment;Emergent impairment (fluctuating level of awareness) Behaviors: Confabulation Safety/Judgment: Impaired       Comprehension  Auditory Comprehension Overall Auditory Comprehension: Appears within functional limits for tasks assessed Yes/No Questions: Within Functional Limits (basic) Commands: Within Functional Limits (1-step) Conversation: Simple Visual Recognition/Discrimination Discrimination: Not tested Reading Comprehension Reading Status: Not tested    Expression Expression Primary Mode of Expression: Verbal Verbal Expression Overall Verbal Expression: Impaired Initiation: No impairment Automatic Speech: Name;Social Response Level of Generative/Spontaneous Verbalization: Sentence;Conversation Repetition: No impairment Naming:  (anomia noted during informal conversational exchanges) Interfering Components: Premorbid deficit Written Expression Dominant Hand:  Right Written Expression: Not tested   Oral / Motor  Oral Motor/Sensory Function Overall Oral Motor/Sensory Function: Mild impairment Lingual ROM: Reduced right Lingual Strength: Reduced Motor Speech Overall Motor Speech: Impaired Respiration: Within functional limits Phonation: Normal Resonance: Within functional limits Articulation: Impaired Intelligibility: Intelligibility reduced Sentence: 75-100% accurate Conversation: 75-100% accurate Effective Techniques: Slow rate;Over-articulate           [name], M.S., CCC-SLP Speech-Language Pathologist Secure Chat Preferred  O: [phone]  Woodroe Chen 09/07/2023, 12:13 PM

## 2023-09-08 DIAGNOSIS — G8929 Other chronic pain: Secondary | ICD-10-CM | POA: Diagnosis present

## 2023-09-08 DIAGNOSIS — F01A4 Vascular dementia, mild, with anxiety: Secondary | ICD-10-CM | POA: Diagnosis present

## 2023-09-08 DIAGNOSIS — G4710 Hypersomnia, unspecified: Secondary | ICD-10-CM | POA: Diagnosis present

## 2023-09-08 DIAGNOSIS — N400 Enlarged prostate without lower urinary tract symptoms: Secondary | ICD-10-CM | POA: Diagnosis present

## 2023-09-08 DIAGNOSIS — E1165 Type 2 diabetes mellitus with hyperglycemia: Secondary | ICD-10-CM | POA: Diagnosis present

## 2023-09-08 DIAGNOSIS — D72829 Elevated white blood cell count, unspecified: Secondary | ICD-10-CM | POA: Diagnosis present

## 2023-09-08 DIAGNOSIS — F01A3 Vascular dementia, mild, with mood disturbance: Secondary | ICD-10-CM | POA: Diagnosis present

## 2023-09-08 DIAGNOSIS — R410 Disorientation, unspecified: Secondary | ICD-10-CM

## 2023-09-08 DIAGNOSIS — I63232 Cerebral infarction due to unspecified occlusion or stenosis of left carotid arteries: Secondary | ICD-10-CM | POA: Diagnosis present

## 2023-09-08 DIAGNOSIS — E1122 Type 2 diabetes mellitus with diabetic chronic kidney disease: Secondary | ICD-10-CM | POA: Diagnosis present

## 2023-09-08 DIAGNOSIS — D649 Anemia, unspecified: Secondary | ICD-10-CM | POA: Diagnosis present

## 2023-09-08 DIAGNOSIS — E876 Hypokalemia: Secondary | ICD-10-CM | POA: Diagnosis not present

## 2023-09-08 DIAGNOSIS — I129 Hypertensive chronic kidney disease with stage 1 through stage 4 chronic kidney disease, or unspecified chronic kidney disease: Secondary | ICD-10-CM | POA: Diagnosis present

## 2023-09-08 DIAGNOSIS — I2510 Atherosclerotic heart disease of native coronary artery without angina pectoris: Secondary | ICD-10-CM | POA: Diagnosis present

## 2023-09-08 DIAGNOSIS — E785 Hyperlipidemia, unspecified: Secondary | ICD-10-CM | POA: Diagnosis present

## 2023-09-08 DIAGNOSIS — Z794 Long term (current) use of insulin: Secondary | ICD-10-CM | POA: Diagnosis not present

## 2023-09-08 DIAGNOSIS — I639 Cerebral infarction, unspecified: Secondary | ICD-10-CM | POA: Diagnosis not present

## 2023-09-08 DIAGNOSIS — G9341 Metabolic encephalopathy: Secondary | ICD-10-CM | POA: Diagnosis present

## 2023-09-08 DIAGNOSIS — I480 Paroxysmal atrial fibrillation: Secondary | ICD-10-CM | POA: Diagnosis present

## 2023-09-08 DIAGNOSIS — I69391 Dysphagia following cerebral infarction: Secondary | ICD-10-CM | POA: Diagnosis not present

## 2023-09-08 DIAGNOSIS — I63422 Cerebral infarction due to embolism of left anterior cerebral artery: Secondary | ICD-10-CM | POA: Diagnosis present

## 2023-09-08 DIAGNOSIS — Z79899 Other long term (current) drug therapy: Secondary | ICD-10-CM | POA: Diagnosis not present

## 2023-09-08 DIAGNOSIS — N1832 Chronic kidney disease, stage 3b: Secondary | ICD-10-CM | POA: Diagnosis present

## 2023-09-08 DIAGNOSIS — N3000 Acute cystitis without hematuria: Secondary | ICD-10-CM | POA: Diagnosis present

## 2023-09-08 DIAGNOSIS — E1140 Type 2 diabetes mellitus with diabetic neuropathy, unspecified: Secondary | ICD-10-CM | POA: Diagnosis present

## 2023-09-08 DIAGNOSIS — M542 Cervicalgia: Secondary | ICD-10-CM | POA: Diagnosis present

## 2023-09-08 DIAGNOSIS — Z781 Physical restraint status: Secondary | ICD-10-CM | POA: Diagnosis not present

## 2023-09-08 LAB — CBC
HCT: 31.9 % — ABNORMAL LOW (ref 39.0–52.0)
Hemoglobin: 10.1 g/dL — ABNORMAL LOW (ref 13.0–17.0)
MCH: 26.9 pg (ref 26.0–34.0)
MCHC: 31.7 g/dL (ref 30.0–36.0)
MCV: 84.8 fL (ref 80.0–100.0)
Platelets: 214 10*3/uL (ref 150–400)
RBC: 3.76 MIL/uL — ABNORMAL LOW (ref 4.22–5.81)
RDW: 15.7 % — ABNORMAL HIGH (ref 11.5–15.5)
WBC: 9.5 10*3/uL (ref 4.0–10.5)
nRBC: 0 % (ref 0.0–0.2)

## 2023-09-08 LAB — BASIC METABOLIC PANEL
Anion gap: 11 (ref 5–15)
BUN: 20 mg/dL (ref 8–23)
CO2: 20 mmol/L — ABNORMAL LOW (ref 22–32)
Calcium: 8.6 mg/dL — ABNORMAL LOW (ref 8.9–10.3)
Chloride: 106 mmol/L (ref 98–111)
Creatinine, Ser: 1.66 mg/dL — ABNORMAL HIGH (ref 0.61–1.24)
GFR, Estimated: 40 mL/min — ABNORMAL LOW (ref >60)
Glucose, Bld: 133 mg/dL — ABNORMAL HIGH (ref 70–99)
Potassium: 3.8 mmol/L (ref 3.5–5.1)
Sodium: 137 mmol/L (ref 135–145)

## 2023-09-08 MED ORDER — MIRTAZAPINE 15 MG PO TABS
7.5000 mg | ORAL_TABLET | Freq: Every day | ORAL | Status: DC
  Administered 2023-09-08 – 2023-09-09 (×2): 7.5 mg via ORAL
  Filled 2023-09-08 (×2): qty 1

## 2023-09-08 MED ORDER — QUETIAPINE FUMARATE 50 MG PO TABS
50.0000 mg | ORAL_TABLET | Freq: Every day | ORAL | Status: DC
  Administered 2023-09-08 – 2023-09-09 (×2): 50 mg via ORAL
  Filled 2023-09-08 (×2): qty 1

## 2023-09-08 NOTE — Progress Notes (Signed)
Pt would not keep cardiac monitor electrodes in place. The risks and benefits of cardiac monitoring explained to pt, but he refused to follow commands.

## 2023-09-08 NOTE — Progress Notes (Signed)
OT Cancellation Note  Patient Details Name: CAILLOU SNEDEKER MRN: [id number] DOB: 08/04/1935   Cancelled Treatment:    Reason Eval/Treat Not Completed: Fatigue/lethargy limiting ability to participateSon requesting the pt rest at this time. Will follow up as soon as possible.  Presley Raddle OTR/L  Acute Rehab Services  [phone] office number  Alphia Moh 09/08/2023, 1:12 PM

## 2023-09-08 NOTE — Plan of Care (Signed)
  Problem: Education: Goal: Knowledge of General Education information will improve Description: Including pain rating scale, medication(s)/side effects and non-pharmacologic comfort measures Outcome: Progressing   Problem: Health Behavior/Discharge Planning: Goal: Ability to manage health-related needs will improve Outcome: Progressing   Problem: Clinical Measurements: Goal: Ability to maintain clinical measurements within normal limits will improve Outcome: Progressing Goal: Will remain free from infection Outcome: Progressing Goal: Diagnostic test results will improve Outcome: Progressing Goal: Respiratory complications will improve Outcome: Progressing Goal: Cardiovascular complication will be avoided Outcome: Progressing   Problem: Activity: Goal: Risk for activity intolerance will decrease Outcome: Progressing   Problem: Nutrition: Goal: Adequate nutrition will be maintained Outcome: Progressing   Problem: Coping: Goal: Level of anxiety will decrease Outcome: Progressing   Problem: Elimination: Goal: Will not experience complications related to bowel motility Outcome: Progressing Goal: Will not experience complications related to urinary retention Outcome: Progressing   Problem: Pain Management: Goal: General experience of comfort will improve Outcome: Progressing   Problem: Safety: Goal: Ability to remain free from injury will improve Outcome: Progressing   Problem: Skin Integrity: Goal: Risk for impaired skin integrity will decrease Outcome: Progressing   Problem: Education: Goal: Ability to describe self-care measures that may prevent or decrease complications (Diabetes Survival Skills Education) will improve Outcome: Progressing Goal: Individualized Educational Video(s) Outcome: Progressing   Problem: Coping: Goal: Ability to adjust to condition or change in health will improve Outcome: Progressing   Problem: Fluid Volume: Goal: Ability to  maintain a balanced intake and output will improve Outcome: Progressing   Problem: Health Behavior/Discharge Planning: Goal: Ability to identify and utilize available resources and services will improve Outcome: Progressing Goal: Ability to manage health-related needs will improve Outcome: Progressing   Problem: Metabolic: Goal: Ability to maintain appropriate glucose levels will improve Outcome: Progressing   Problem: Nutritional: Goal: Maintenance of adequate nutrition will improve Outcome: Progressing Goal: Progress toward achieving an optimal weight will improve Outcome: Progressing   Problem: Skin Integrity: Goal: Risk for impaired skin integrity will decrease Outcome: Progressing   Problem: Tissue Perfusion: Goal: Adequacy of tissue perfusion will improve Outcome: Progressing   Problem: Safety: Goal: Non-violent Restraint(s) Outcome: Progressing
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
## 2023-09-08 NOTE — Plan of Care (Signed)
  Problem: Education: Goal: Knowledge of General Education information will improve Description: Including pain rating scale, medication(s)/side effects and non-pharmacologic comfort measures Outcome: Not Progressing   Problem: Health Behavior/Discharge Planning: Goal: Ability to manage health-related needs will improve Outcome: Not Progressing   Problem: Clinical Measurements: Goal: Ability to maintain clinical measurements within normal limits will improve Outcome: Progressing Goal: Will remain free from infection Outcome: Progressing Goal: Diagnostic test results will improve Outcome: Progressing Goal: Respiratory complications will improve Outcome: Progressing Goal: Cardiovascular complication will be avoided Outcome: Progressing   Problem: Activity: Goal: Risk for activity intolerance will decrease Outcome: Progressing   Problem: Nutrition: Goal: Adequate nutrition will be maintained Outcome: Progressing   Problem: Coping: Goal: Level of anxiety will decrease Outcome: Not Progressing   Problem: Elimination: Goal: Will not experience complications related to bowel motility Outcome: Progressing Goal: Will not experience complications related to urinary retention Outcome: Progressing   Problem: Pain Management: Goal: General experience of comfort will improve Outcome: Progressing   Problem: Safety: Goal: Ability to remain free from injury will improve Outcome: Progressing   Problem: Skin Integrity: Goal: Risk for impaired skin integrity will decrease Outcome: Progressing   Problem: Education: Goal: Ability to describe self-care measures that may prevent or decrease complications (Diabetes Survival Skills Education) will improve Outcome: Not Progressing   Problem: Coping: Goal: Ability to adjust to condition or change in health will improve Outcome: Progressing   Problem: Fluid Volume: Goal: Ability to maintain a balanced intake and output will  improve Outcome: Progressing   Problem: Health Behavior/Discharge Planning: Goal: Ability to identify and utilize available resources and services will improve Outcome: Not Progressing Goal: Ability to manage health-related needs will improve Outcome: Not Progressing   Problem: Metabolic: Goal: Ability to maintain appropriate glucose levels will improve Outcome: Progressing   Problem: Nutritional: Goal: Maintenance of adequate nutrition will improve Outcome: Progressing Goal: Progress toward achieving an optimal weight will improve Outcome: Progressing   Problem: Skin Integrity: Goal: Risk for impaired skin integrity will decrease Outcome: Progressing   Problem: Tissue Perfusion: Goal: Adequacy of tissue perfusion will improve Outcome: Progressing   Problem: Safety: Goal: Non-violent Restraint(s) Outcome: Progressing
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
## 2023-09-08 NOTE — Progress Notes (Signed)
Internal Medicine Attending:  I was physically present during the critical or key portions of the resident provided service and participated in formulating the plan of care and medical decision making for the patient as documented in the resident's note.  Saw Patient today with Dr. Ninfa Meeker.  Please note correction from his note, K is 3.8 today, hypokalemia is resolved.   Inez Catalina, MD
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
## 2023-09-09 DIAGNOSIS — I639 Cerebral infarction, unspecified: Secondary | ICD-10-CM | POA: Diagnosis not present

## 2023-09-09 LAB — CBC
HCT: 33.9 % — ABNORMAL LOW (ref 39.0–52.0)
Hemoglobin: 10.8 g/dL — ABNORMAL LOW (ref 13.0–17.0)
MCH: 27.4 pg (ref 26.0–34.0)
MCHC: 31.9 g/dL (ref 30.0–36.0)
MCV: 86 fL (ref 80.0–100.0)
Platelets: 221 10*3/uL (ref 150–400)
RBC: 3.94 MIL/uL — ABNORMAL LOW (ref 4.22–5.81)
RDW: 15.8 % — ABNORMAL HIGH (ref 11.5–15.5)
WBC: 9.6 10*3/uL (ref 4.0–10.5)
nRBC: 0 % (ref 0.0–0.2)

## 2023-09-09 LAB — BASIC METABOLIC PANEL
Anion gap: 11 (ref 5–15)
BUN: 18 mg/dL (ref 8–23)
CO2: 21 mmol/L — ABNORMAL LOW (ref 22–32)
Calcium: 8.9 mg/dL (ref 8.9–10.3)
Chloride: 105 mmol/L (ref 98–111)
Creatinine, Ser: 1.59 mg/dL — ABNORMAL HIGH (ref 0.61–1.24)
GFR, Estimated: 42 mL/min — ABNORMAL LOW (ref >60)
Glucose, Bld: 177 mg/dL — ABNORMAL HIGH (ref 70–99)
Potassium: 3.9 mmol/L (ref 3.5–5.1)
Sodium: 137 mmol/L (ref 135–145)

## 2023-09-09 MED ORDER — EZETIMIBE 10 MG PO TABS
10.0000 mg | ORAL_TABLET | Freq: Every day | ORAL | Status: DC
  Administered 2023-09-09 – 2023-09-10 (×2): 10 mg via ORAL
  Filled 2023-09-09 (×2): qty 1

## 2023-09-09 NOTE — Progress Notes (Signed)
10:40H Patient still sleeping. Tried to wake him up for blood extraction but patient becomes aggressive when being disturb. Inform lab tech we will call them once patient is awake and cooperative.

## 2023-09-09 NOTE — Progress Notes (Signed)
Physical Therapy Treatment Patient Details Name: Duane [name] MRN: [id number] DOB: 02/23/1935 Today's Date: 09/09/2023   History of Present Illness Pt is 88 yo presenting from [facility] nursing facility after 3 falls, slurred speech and increased confusion from baseline. MRI shows small acute left corona radiata infarcts. PMH positive for a-fib, arthritis, CAD, DM, Eczema, HTN, dementia, HLD, CKD III, sleep apnea, MI, prostatic hypertrophy, MI, and  C5-C7 Laminectomy and C7-T1 fusion 01/2023.    PT Comments  Pt asleep on arrival, easily aroused and agreeable to get up for lunch. Pt needing min assist for transition to sitting, walked with RW and up to chair for meal. Pt denied further activity but pleasant and cooperative throughout session. Will continue to follow.      If plan is discharge home, recommend the following: Supervision due to cognitive status;Assistance with cooking/housework;Assist for transportation;A little help with walking and/or transfers   Can travel by private vehicle        Equipment Recommendations  None recommended by PT    Recommendations for Other Services       Precautions / Restrictions Precautions Precautions: Fall Restrictions Weight Bearing Restrictions: No     Mobility  Bed Mobility               General bed mobility comments: Pulled on therapists hand to help achieve sitting, cues to initiate transfer    Transfers                        Ambulation/Gait               General Gait Details: Pt requires intermittent verbal and physical cues to stay within RW, performed turning with slow shuffling gait patteren   Stairs             Wheelchair Mobility     Tilt Bed    Modified Rankin (Stroke Patients Only)       Balance               Standing balance comment: Pt utilized RW during dynamic activities, CGA for safety                            Cognition                                                 Exercises      General Comments        Pertinent Vitals/Pain      Home Living                          Prior Function            PT Goals (current goals can now be found in the care plan section)      Frequency    Min 1X/week      PT Plan      Co-evaluation              AM-PAC PT "6 Clicks" Mobility   Outcome Measure  Help needed turning from your back to your side while in a flat bed without using bedrails?: A Little Help needed moving from lying on your back to sitting on the side of a flat bed without  using bedrails?: A Little Help needed moving to and from a bed to a chair (including a wheelchair)?: A Little Help needed standing up from a chair using your arms (e.g., wheelchair or bedside chair)?: A Little Help needed to walk in hospital room?: A Little Help needed climbing 3-5 steps with a railing? : A Little 6 Click Score: 18    End of Session Equipment Utilized During Treatment: Gait belt Activity Tolerance: Patient tolerated treatment well Patient left: in chair;with call bell/phone within reach;with chair alarm set;with nursing/sitter in room Nurse Communication: Mobility status PT Visit Diagnosis: Other abnormalities of gait and mobility (R26.89);Unsteadiness on feet (R26.81)     Time: 1247-1300 PT Time Calculation (min) (ACUTE ONLY): 13 min  Charges:    $Gait Training: 8-22 mins PT General Charges $$ ACUTE PT VISIT: 1 Visit                     Merryl Hacker, PT Acute Rehabilitation Services Office: [phone]    Enedina Finner Millisa Giarrusso 09/09/2023, 1:36 PM
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
## 2023-09-09 NOTE — Progress Notes (Signed)
Attempted to see pt today. Pt refusing therapy asking therapist to leave him alone and get out.  Attempted to explain the need to get up and move around but pt continued to become angry and asked therapist to leave. Will attempt back as schedule allows. Tory Emerald, Arab[place] [phone]

## 2023-09-09 NOTE — Plan of Care (Signed)
  Problem: Education: Goal: Knowledge of General Education information will improve Description: Including pain rating scale, medication(s)/side effects and non-pharmacologic comfort measures Outcome: Progressing   Problem: Health Behavior/Discharge Planning: Goal: Ability to manage health-related needs will improve Outcome: Progressing   Problem: Clinical Measurements: Goal: Ability to maintain clinical measurements within normal limits will improve Outcome: Progressing Goal: Will remain free from infection Outcome: Progressing Goal: Diagnostic test results will improve Outcome: Progressing Goal: Respiratory complications will improve Outcome: Progressing Goal: Cardiovascular complication will be avoided Outcome: Progressing   Problem: Activity: Goal: Risk for activity intolerance will decrease Outcome: Progressing   Problem: Nutrition: Goal: Adequate nutrition will be maintained Outcome: Progressing   Problem: Coping: Goal: Level of anxiety will decrease Outcome: Progressing   Problem: Elimination: Goal: Will not experience complications related to bowel motility Outcome: Progressing Goal: Will not experience complications related to urinary retention Outcome: Progressing   Problem: Pain Management: Goal: General experience of comfort will improve Outcome: Progressing   Problem: Safety: Goal: Ability to remain free from injury will improve Outcome: Progressing   Problem: Skin Integrity: Goal: Risk for impaired skin integrity will decrease Outcome: Progressing   Problem: Education: Goal: Ability to describe self-care measures that may prevent or decrease complications (Diabetes Survival Skills Education) will improve Outcome: Progressing Goal: Individualized Educational Video(s) Outcome: Progressing   Problem: Coping: Goal: Ability to adjust to condition or change in health will improve Outcome: Progressing   Problem: Fluid Volume: Goal: Ability to  maintain a balanced intake and output will improve Outcome: Progressing   Problem: Health Behavior/Discharge Planning: Goal: Ability to identify and utilize available resources and services will improve Outcome: Progressing Goal: Ability to manage health-related needs will improve Outcome: Progressing   Problem: Metabolic: Goal: Ability to maintain appropriate glucose levels will improve Outcome: Progressing   Problem: Nutritional: Goal: Maintenance of adequate nutrition will improve Outcome: Progressing Goal: Progress toward achieving an optimal weight will improve Outcome: Progressing   Problem: Skin Integrity: Goal: Risk for impaired skin integrity will decrease Outcome: Progressing   Problem: Tissue Perfusion: Goal: Adequacy of tissue perfusion will improve Outcome: Progressing   Problem: Safety: Goal: Non-violent Restraint(s) Outcome: Progressing

## 2023-09-09 NOTE — TOC Progression Note (Signed)
Transition of Care [facility]) - Progression Note    Patient Details  Name: Duane [name] MRN: [id number] Date of Birth: Sep 14, 1935  Transition of Care [facility]) CM/SW Contact  Erin Sons, [place] Phone Number: 09/09/2023, 3:01 PM  Clinical Narrative:     CSW spoke with ALF liaison who explains facility cannot admit pt back until Monday. They were concerned about his aggressive behavior and although it is improved, they are they want to have full staff on his initial return which they won't have over the weekend.   Expected Discharge Plan: Assisted Living Barriers to Discharge: Other (must enter comment) (Facility wants pt to return when they have full staffing on Monday due to recent agitation.)  Expected Discharge Plan and Services     Post Acute Care Choice: Nursing Home Living arrangements for the past 2 months: Assisted Living Facility                                       Social Determinants of Health (SDOH) Interventions SDOH Screenings   Food Insecurity: No Food Insecurity (09/06/2023)  Housing: Low Risk  (09/06/2023)  Transportation Needs: No Transportation Needs (09/06/2023)  Utilities: Not At Risk (09/06/2023)  Tobacco Use: Low Risk  (09/06/2023)    Readmission Risk Interventions     No data to display

## 2023-09-09 NOTE — Progress Notes (Signed)
Patient had a great night. Very cooperative with no agitation. Patient was able to get >8 hours of sleep. RN promoted and educated the importance of awake cycle during the day to decease any hospital delirium. Getting the patient back to his memory unit will be beneficial to his mentation.

## 2023-09-10 DIAGNOSIS — I639 Cerebral infarction, unspecified: Secondary | ICD-10-CM | POA: Diagnosis not present

## 2023-09-10 MED ORDER — EZETIMIBE 10 MG PO TABS: 10.0000 mg | ORAL_TABLET | Freq: Every day | ORAL | Status: DC

## 2023-09-10 MED ORDER — ORAL CARE MOUTH RINSE: 15.0000 mL | OROMUCOSAL | Status: DC | PRN

## 2023-09-10 MED ORDER — OLANZAPINE 10 MG IM SOLR
2.5000 mg | Freq: Every day | INTRAMUSCULAR | Status: DC | PRN
Start: 2023-09-10 — End: 2023-09-10

## 2023-09-10 MED ORDER — OLANZAPINE 10 MG IM SOLR
2.5000 mg | Freq: Every day | INTRAMUSCULAR | 3 refills | Status: DC | PRN
Start: 2023-09-10 — End: 2023-11-02

## 2023-09-10 MED ORDER — CEPHALEXIN 500 MG PO CAPS: 500.0000 mg | ORAL_CAPSULE | Freq: Two times a day (BID) | ORAL | Status: DC

## 2023-09-10 NOTE — Discharge Summary (Addendum)
Psychiatric:        Mood and Affect: Mood normal.      Pertinent Labs, Studies, and Procedures:     Latest Ref Rng & Units 09/09/2023    1:59 PM 09/08/2023    6:03 AM 09/07/2023    8:38 AM  CBC  WBC 4.0 - 10.5 K/uL 9.6  9.5  8.9   Hemoglobin 13.0 - 17.0 g/dL 87.5  64.3  32.9   Hematocrit 39.0 - 52.0 % 33.9  31.9  32.8   Platelets 150 - 400 K/uL 221  214  213        Latest Ref Rng & Units 09/09/2023    1:59 PM 09/08/2023    6:03 AM 09/07/2023    8:38 AM  CMP  Glucose 70 - 99 mg/dL 518  841  660   BUN 8 - 23 mg/dL 18  20  18    Creatinine 0.61 - 1.24 mg/dL 6.30  1.60  1.09   Sodium 135 - 145 mmol/L 137  137  137   Potassium 3.5 - 5.1 mmol/L 3.9  3.8  3.3   Chloride 98 - 111 mmol/L 105  106  103   CO2 22 - 32 mmol/L 21  20  23    Calcium 8.9 - 10.3 mg/dL 8.9  8.6  8.9     VAS US CAROTID  Result Date: 09/07/2023 Carotid Arterial Duplex Study Patient Name:  Duane [name]  Date of Exam:   09/07/2023 Medical Rec #: [id number]        Accession #:    [id number] Date of Birth: 1935-06-04        Patient Gender: M Patient Age:   88 years Exam Location:  [facility] Procedure:      VAS US CAROTID Referring Phys: Terrilee Files [facility] --------------------------------------------------------------------------------  Indications:       Multiple small acute infarcts in the left corona radiata.                    Slurred speech, weakness'. Risk Factors:      Hypertension, hyperlipidemia, Diabetes. Comparison Study:  none Performing Technologist: Jeb Levering RDMS, RVT  Examination Guidelines: A complete evaluation includes B-mode imaging, spectral  Doppler, color Doppler, and power Doppler as needed of all accessible portions of each vessel. Bilateral testing is considered an integral part of a complete examination. Limited examinations for reoccurring indications may be performed as noted.  Right Carotid Findings: +----------+--------+--------+--------+------------------+--------+           PSV cm/sEDV cm/sStenosisPlaque DescriptionComments +----------+--------+--------+--------+------------------+--------+ CCA Prox  60      13                                         +----------+--------+--------+--------+------------------+--------+ CCA Distal74      10                                         +----------+--------+--------+--------+------------------+--------+ ICA Prox  42      15      1-39%                              +----------+--------+--------+--------+------------------+--------+ ICA Distal88      20                                         +----------+--------+--------+--------+------------------+--------+  71.0 in Accession #:    [id number]      Weight:       208.3 lb Date of Birth:  22-Mar-1935       BSA:          2.145 m Patient Age:    88 years        BP:           130/86  mmHg Patient Gender: M               HR:           70 bpm. Exam Location:  Inpatient Procedure: 2D Echo, Cardiac Doppler and Color Doppler Indications:    Stroke  History:        Patient has prior history of Echocardiogram examinations, most                 recent 05/12/2021. Stroke, Arrythmias:Atrial Fibrillation; Risk                 Factors:Hypertension, Diabetes, Dyslipidemia and Sleep Apnea.  Sonographer:    Melton Krebs RDCS, FE, PE Referring Phys: [id number] [facility] IMPRESSIONS  1. Left ventricular ejection fraction, by estimation, is 50 to 55%. The left ventricle has low normal function. The left ventricle has no regional wall motion abnormalities. Left ventricular diastolic parameters are indeterminate.  2. Right ventricular systolic function is normal. The right ventricular size is normal.  3. The mitral valve is normal in structure. No evidence of mitral valve regurgitation. No evidence of mitral stenosis.  4. The aortic valve is calcified. There is moderate thickening of the aortic valve. Aortic valve regurgitation is not visualized. Aortic valve sclerosis/calcification is present, without any evidence of aortic stenosis.  5. The inferior vena cava is normal in size with greater than 50% respiratory variability, suggesting right atrial pressure of 3 mmHg. FINDINGS  Left Ventricle: Left ventricular ejection fraction, by estimation, is 50 to 55%. The left ventricle has low normal function. The left ventricle has no regional wall motion abnormalities. The left ventricular internal cavity size was normal in size. There is no left ventricular hypertrophy. Left ventricular diastolic function could not be evaluated due to indeterminate diastolic function. Left ventricular diastolic parameters are indeterminate. Right Ventricle: The right ventricular size is normal. No increase in right ventricular wall thickness. Right ventricular systolic function is normal. Left Atrium: Left atrial size was normal in  size. Right Atrium: Right atrial size was normal in size. Pericardium: There is no evidence of pericardial effusion. Mitral Valve: The mitral valve is normal in structure. Mild to moderate mitral annular calcification. No evidence of mitral valve regurgitation. No evidence of mitral valve stenosis. Tricuspid Valve: The tricuspid valve is normal in structure. Tricuspid valve regurgitation is not demonstrated. No evidence of tricuspid stenosis. Aortic Valve: The aortic valve is calcified. There is moderate thickening of the aortic valve. Aortic valve regurgitation is not visualized. Aortic valve sclerosis/calcification is present, without any evidence of aortic stenosis. Aortic valve mean gradient measures 6.0 mmHg. Aortic valve peak gradient measures 9.1 mmHg. Aortic valve area, by VTI measures 1.78 cm. Pulmonic Valve: The pulmonic valve was normal in structure. Pulmonic valve regurgitation is not visualized. No evidence of pulmonic stenosis. Aorta: The aortic root is normal in size and structure. Venous: The inferior vena cava is normal in size with greater than 50% respiratory variability, suggesting right atrial pressure of 3 mmHg. IAS/Shunts: No atrial level shunt detected by color flow Doppler.  LEFT VENTRICLE PLAX  Name: DUB SOLTANI MRN: [id number] DOB: Apr 08, 1935 88 y.o. PCP: Nadara Eaton, MD  Date of Admission: 09/06/2023  9:26 AM Date of Discharge:  09/10/23 Attending Physician: Dr. Oswaldo Done  DISCHARGE DIAGNOSIS:  Primary Problem: Acute cerebral infarction [facility])   Hospital Problems: Active Problems:   PAF (paroxysmal atrial fibrillation) (HCC)   Diabetes mellitus type 2, controlled (HCC)   CKD (chronic kidney disease), stage III (HCC)   Vascular dementia, mild, with mood disturbance (HCC)   Cerebral infarction (HCC)    DISCHARGE MEDICATIONS:   Allergies as of 09/10/2023       Reactions   Baclofen Other (See Comments)   Severe AMS, comatose, intubated   Bee Venom Anaphylaxis   Contrast Media [iodinated Contrast Media] Anaphylaxis   Iodine-131 Anaphylaxis   Metrizamide Anaphylaxis   Metronidazole Anaphylaxis   Atorvastatin Other (See Comments)   Aching in legs   Lisinopril Cough        Medication List     STOP taking these medications    cyclobenzaprine 5 MG tablet Commonly known as: FLEXERIL   LORazepam 1 MG tablet Commonly known as: ATIVAN   nitroGLYCERIN 0.4 MG SL tablet Commonly known as: NITROSTAT       TAKE these medications    amLODipine 5 MG tablet Commonly known as: NORVASC Take 5 mg by mouth in the morning and at bedtime.   Biofreeze Cool The Pain 4 % Gel Generic drug: Menthol (Topical Analgesic) Apply 1 Application topically in the morning, at noon, and at bedtime.   Biofreeze Cool The Pain 4 % Gel Generic drug: Menthol (Topical Analgesic) Apply 1 Application topically every 4 (four) hours as needed (neck and shoulder pain).   Eucerin Itch Relief 0.1 % Lotn Generic drug: Menthol (Topical Analgesic) Apply 1 Application topically in the morning and at bedtime. Apply to lower legs   Calcium + Vitamin D3 500-5 MG-MCG Tabs Generic drug: Calcium Carb-Cholecalciferol Take 1 tablet by mouth daily.   cephALEXin 500 MG capsule Commonly  known as: KEFLEX Take 1 capsule (500 mg total) by mouth 2 (two) times daily. Take one in evening of 10/26, and one in morning of 10/27 to complete course for UTI.   clopidogrel 75 MG tablet Commonly known as: PLAVIX Take 1 tablet (75 mg total) by mouth daily.   EPINEPHrine 0.3 mg/0.3 mL Soaj injection Commonly known as: EPI-PEN Inject 0.3 mg into the muscle once as needed (severe allergic reaction).   escitalopram 5 MG tablet Commonly known as: LEXAPRO Take 10 mg by mouth daily.   ezetimibe 10 MG tablet Commonly known as: ZETIA Take 1 tablet (10 mg total) by mouth daily. Start taking on: September 11, 2023   hydrochlorothiazide 12.5 MG tablet Commonly known as: HYDRODIURIL Take 12.5 mg by mouth daily.   insulin detemir 100 UNIT/ML injection Commonly known as: Levemir Inject 0.05 mLs (5 Units total) into the skin at bedtime. What changed:  how much to take when to take this   memantine 5 MG tablet Commonly known as: NAMENDA Take 5 mg by mouth at bedtime.   memantine 10 MG tablet Commonly known as: NAMENDA Take 10 mg by mouth in the morning.   metoprolol succinate 50 MG 24 hr tablet Commonly known as: TOPROL-XL Take 100 mg by mouth daily.   mirtazapine 15 MG tablet Commonly known as: REMERON Take 7.5 mg by mouth at bedtime.   OLANZapine injection Commonly known as: ZyPREXA Inject 0.5 mLs (2.5 mg total) into the muscle daily as  71.0 in Accession #:    [id number]      Weight:       208.3 lb Date of Birth:  22-Mar-1935       BSA:          2.145 m Patient Age:    88 years        BP:           130/86  mmHg Patient Gender: M               HR:           70 bpm. Exam Location:  Inpatient Procedure: 2D Echo, Cardiac Doppler and Color Doppler Indications:    Stroke  History:        Patient has prior history of Echocardiogram examinations, most                 recent 05/12/2021. Stroke, Arrythmias:Atrial Fibrillation; Risk                 Factors:Hypertension, Diabetes, Dyslipidemia and Sleep Apnea.  Sonographer:    Melton Krebs RDCS, FE, PE Referring Phys: [id number] [facility] IMPRESSIONS  1. Left ventricular ejection fraction, by estimation, is 50 to 55%. The left ventricle has low normal function. The left ventricle has no regional wall motion abnormalities. Left ventricular diastolic parameters are indeterminate.  2. Right ventricular systolic function is normal. The right ventricular size is normal.  3. The mitral valve is normal in structure. No evidence of mitral valve regurgitation. No evidence of mitral stenosis.  4. The aortic valve is calcified. There is moderate thickening of the aortic valve. Aortic valve regurgitation is not visualized. Aortic valve sclerosis/calcification is present, without any evidence of aortic stenosis.  5. The inferior vena cava is normal in size with greater than 50% respiratory variability, suggesting right atrial pressure of 3 mmHg. FINDINGS  Left Ventricle: Left ventricular ejection fraction, by estimation, is 50 to 55%. The left ventricle has low normal function. The left ventricle has no regional wall motion abnormalities. The left ventricular internal cavity size was normal in size. There is no left ventricular hypertrophy. Left ventricular diastolic function could not be evaluated due to indeterminate diastolic function. Left ventricular diastolic parameters are indeterminate. Right Ventricle: The right ventricular size is normal. No increase in right ventricular wall thickness. Right ventricular systolic function is normal. Left Atrium: Left atrial size was normal in  size. Right Atrium: Right atrial size was normal in size. Pericardium: There is no evidence of pericardial effusion. Mitral Valve: The mitral valve is normal in structure. Mild to moderate mitral annular calcification. No evidence of mitral valve regurgitation. No evidence of mitral valve stenosis. Tricuspid Valve: The tricuspid valve is normal in structure. Tricuspid valve regurgitation is not demonstrated. No evidence of tricuspid stenosis. Aortic Valve: The aortic valve is calcified. There is moderate thickening of the aortic valve. Aortic valve regurgitation is not visualized. Aortic valve sclerosis/calcification is present, without any evidence of aortic stenosis. Aortic valve mean gradient measures 6.0 mmHg. Aortic valve peak gradient measures 9.1 mmHg. Aortic valve area, by VTI measures 1.78 cm. Pulmonic Valve: The pulmonic valve was normal in structure. Pulmonic valve regurgitation is not visualized. No evidence of pulmonic stenosis. Aorta: The aortic root is normal in size and structure. Venous: The inferior vena cava is normal in size with greater than 50% respiratory variability, suggesting right atrial pressure of 3 mmHg. IAS/Shunts: No atrial level shunt detected by color flow Doppler.  LEFT VENTRICLE PLAX  Psychiatric:        Mood and Affect: Mood normal.      Pertinent Labs, Studies, and Procedures:     Latest Ref Rng & Units 09/09/2023    1:59 PM 09/08/2023    6:03 AM 09/07/2023    8:38 AM  CBC  WBC 4.0 - 10.5 K/uL 9.6  9.5  8.9   Hemoglobin 13.0 - 17.0 g/dL 87.5  64.3  32.9   Hematocrit 39.0 - 52.0 % 33.9  31.9  32.8   Platelets 150 - 400 K/uL 221  214  213        Latest Ref Rng & Units 09/09/2023    1:59 PM 09/08/2023    6:03 AM 09/07/2023    8:38 AM  CMP  Glucose 70 - 99 mg/dL 518  841  660   BUN 8 - 23 mg/dL 18  20  18    Creatinine 0.61 - 1.24 mg/dL 6.30  1.60  1.09   Sodium 135 - 145 mmol/L 137  137  137   Potassium 3.5 - 5.1 mmol/L 3.9  3.8  3.3   Chloride 98 - 111 mmol/L 105  106  103   CO2 22 - 32 mmol/L 21  20  23    Calcium 8.9 - 10.3 mg/dL 8.9  8.6  8.9     VAS US CAROTID  Result Date: 09/07/2023 Carotid Arterial Duplex Study Patient Name:  Duane [name]  Date of Exam:   09/07/2023 Medical Rec #: [id number]        Accession #:    [id number] Date of Birth: 1935-06-04        Patient Gender: M Patient Age:   88 years Exam Location:  [facility] Procedure:      VAS US CAROTID Referring Phys: Terrilee Files [facility] --------------------------------------------------------------------------------  Indications:       Multiple small acute infarcts in the left corona radiata.                    Slurred speech, weakness'. Risk Factors:      Hypertension, hyperlipidemia, Diabetes. Comparison Study:  none Performing Technologist: Jeb Levering RDMS, RVT  Examination Guidelines: A complete evaluation includes B-mode imaging, spectral  Doppler, color Doppler, and power Doppler as needed of all accessible portions of each vessel. Bilateral testing is considered an integral part of a complete examination. Limited examinations for reoccurring indications may be performed as noted.  Right Carotid Findings: +----------+--------+--------+--------+------------------+--------+           PSV cm/sEDV cm/sStenosisPlaque DescriptionComments +----------+--------+--------+--------+------------------+--------+ CCA Prox  60      13                                         +----------+--------+--------+--------+------------------+--------+ CCA Distal74      10                                         +----------+--------+--------+--------+------------------+--------+ ICA Prox  42      15      1-39%                              +----------+--------+--------+--------+------------------+--------+ ICA Distal88      20                                         +----------+--------+--------+--------+------------------+--------+  Psychiatric:        Mood and Affect: Mood normal.      Pertinent Labs, Studies, and Procedures:     Latest Ref Rng & Units 09/09/2023    1:59 PM 09/08/2023    6:03 AM 09/07/2023    8:38 AM  CBC  WBC 4.0 - 10.5 K/uL 9.6  9.5  8.9   Hemoglobin 13.0 - 17.0 g/dL 87.5  64.3  32.9   Hematocrit 39.0 - 52.0 % 33.9  31.9  32.8   Platelets 150 - 400 K/uL 221  214  213        Latest Ref Rng & Units 09/09/2023    1:59 PM 09/08/2023    6:03 AM 09/07/2023    8:38 AM  CMP  Glucose 70 - 99 mg/dL 518  841  660   BUN 8 - 23 mg/dL 18  20  18    Creatinine 0.61 - 1.24 mg/dL 6.30  1.60  1.09   Sodium 135 - 145 mmol/L 137  137  137   Potassium 3.5 - 5.1 mmol/L 3.9  3.8  3.3   Chloride 98 - 111 mmol/L 105  106  103   CO2 22 - 32 mmol/L 21  20  23    Calcium 8.9 - 10.3 mg/dL 8.9  8.6  8.9     VAS US CAROTID  Result Date: 09/07/2023 Carotid Arterial Duplex Study Patient Name:  Duane [name]  Date of Exam:   09/07/2023 Medical Rec #: [id number]        Accession #:    [id number] Date of Birth: 1935-06-04        Patient Gender: M Patient Age:   88 years Exam Location:  [facility] Procedure:      VAS US CAROTID Referring Phys: Terrilee Files [facility] --------------------------------------------------------------------------------  Indications:       Multiple small acute infarcts in the left corona radiata.                    Slurred speech, weakness'. Risk Factors:      Hypertension, hyperlipidemia, Diabetes. Comparison Study:  none Performing Technologist: Jeb Levering RDMS, RVT  Examination Guidelines: A complete evaluation includes B-mode imaging, spectral  Doppler, color Doppler, and power Doppler as needed of all accessible portions of each vessel. Bilateral testing is considered an integral part of a complete examination. Limited examinations for reoccurring indications may be performed as noted.  Right Carotid Findings: +----------+--------+--------+--------+------------------+--------+           PSV cm/sEDV cm/sStenosisPlaque DescriptionComments +----------+--------+--------+--------+------------------+--------+ CCA Prox  60      13                                         +----------+--------+--------+--------+------------------+--------+ CCA Distal74      10                                         +----------+--------+--------+--------+------------------+--------+ ICA Prox  42      15      1-39%                              +----------+--------+--------+--------+------------------+--------+ ICA Distal88      20                                         +----------+--------+--------+--------+------------------+--------+  Name: DUB SOLTANI MRN: [id number] DOB: Apr 08, 1935 88 y.o. PCP: Nadara Eaton, MD  Date of Admission: 09/06/2023  9:26 AM Date of Discharge:  09/10/23 Attending Physician: Dr. Oswaldo Done  DISCHARGE DIAGNOSIS:  Primary Problem: Acute cerebral infarction [facility])   Hospital Problems: Active Problems:   PAF (paroxysmal atrial fibrillation) (HCC)   Diabetes mellitus type 2, controlled (HCC)   CKD (chronic kidney disease), stage III (HCC)   Vascular dementia, mild, with mood disturbance (HCC)   Cerebral infarction (HCC)    DISCHARGE MEDICATIONS:   Allergies as of 09/10/2023       Reactions   Baclofen Other (See Comments)   Severe AMS, comatose, intubated   Bee Venom Anaphylaxis   Contrast Media [iodinated Contrast Media] Anaphylaxis   Iodine-131 Anaphylaxis   Metrizamide Anaphylaxis   Metronidazole Anaphylaxis   Atorvastatin Other (See Comments)   Aching in legs   Lisinopril Cough        Medication List     STOP taking these medications    cyclobenzaprine 5 MG tablet Commonly known as: FLEXERIL   LORazepam 1 MG tablet Commonly known as: ATIVAN   nitroGLYCERIN 0.4 MG SL tablet Commonly known as: NITROSTAT       TAKE these medications    amLODipine 5 MG tablet Commonly known as: NORVASC Take 5 mg by mouth in the morning and at bedtime.   Biofreeze Cool The Pain 4 % Gel Generic drug: Menthol (Topical Analgesic) Apply 1 Application topically in the morning, at noon, and at bedtime.   Biofreeze Cool The Pain 4 % Gel Generic drug: Menthol (Topical Analgesic) Apply 1 Application topically every 4 (four) hours as needed (neck and shoulder pain).   Eucerin Itch Relief 0.1 % Lotn Generic drug: Menthol (Topical Analgesic) Apply 1 Application topically in the morning and at bedtime. Apply to lower legs   Calcium + Vitamin D3 500-5 MG-MCG Tabs Generic drug: Calcium Carb-Cholecalciferol Take 1 tablet by mouth daily.   cephALEXin 500 MG capsule Commonly  known as: KEFLEX Take 1 capsule (500 mg total) by mouth 2 (two) times daily. Take one in evening of 10/26, and one in morning of 10/27 to complete course for UTI.   clopidogrel 75 MG tablet Commonly known as: PLAVIX Take 1 tablet (75 mg total) by mouth daily.   EPINEPHrine 0.3 mg/0.3 mL Soaj injection Commonly known as: EPI-PEN Inject 0.3 mg into the muscle once as needed (severe allergic reaction).   escitalopram 5 MG tablet Commonly known as: LEXAPRO Take 10 mg by mouth daily.   ezetimibe 10 MG tablet Commonly known as: ZETIA Take 1 tablet (10 mg total) by mouth daily. Start taking on: September 11, 2023   hydrochlorothiazide 12.5 MG tablet Commonly known as: HYDRODIURIL Take 12.5 mg by mouth daily.   insulin detemir 100 UNIT/ML injection Commonly known as: Levemir Inject 0.05 mLs (5 Units total) into the skin at bedtime. What changed:  how much to take when to take this   memantine 5 MG tablet Commonly known as: NAMENDA Take 5 mg by mouth at bedtime.   memantine 10 MG tablet Commonly known as: NAMENDA Take 10 mg by mouth in the morning.   metoprolol succinate 50 MG 24 hr tablet Commonly known as: TOPROL-XL Take 100 mg by mouth daily.   mirtazapine 15 MG tablet Commonly known as: REMERON Take 7.5 mg by mouth at bedtime.   OLANZapine injection Commonly known as: ZyPREXA Inject 0.5 mLs (2.5 mg total) into the muscle daily as  Name: DUB SOLTANI MRN: [id number] DOB: Apr 08, 1935 88 y.o. PCP: Nadara Eaton, MD  Date of Admission: 09/06/2023  9:26 AM Date of Discharge:  09/10/23 Attending Physician: Dr. Oswaldo Done  DISCHARGE DIAGNOSIS:  Primary Problem: Acute cerebral infarction [facility])   Hospital Problems: Active Problems:   PAF (paroxysmal atrial fibrillation) (HCC)   Diabetes mellitus type 2, controlled (HCC)   CKD (chronic kidney disease), stage III (HCC)   Vascular dementia, mild, with mood disturbance (HCC)   Cerebral infarction (HCC)    DISCHARGE MEDICATIONS:   Allergies as of 09/10/2023       Reactions   Baclofen Other (See Comments)   Severe AMS, comatose, intubated   Bee Venom Anaphylaxis   Contrast Media [iodinated Contrast Media] Anaphylaxis   Iodine-131 Anaphylaxis   Metrizamide Anaphylaxis   Metronidazole Anaphylaxis   Atorvastatin Other (See Comments)   Aching in legs   Lisinopril Cough        Medication List     STOP taking these medications    cyclobenzaprine 5 MG tablet Commonly known as: FLEXERIL   LORazepam 1 MG tablet Commonly known as: ATIVAN   nitroGLYCERIN 0.4 MG SL tablet Commonly known as: NITROSTAT       TAKE these medications    amLODipine 5 MG tablet Commonly known as: NORVASC Take 5 mg by mouth in the morning and at bedtime.   Biofreeze Cool The Pain 4 % Gel Generic drug: Menthol (Topical Analgesic) Apply 1 Application topically in the morning, at noon, and at bedtime.   Biofreeze Cool The Pain 4 % Gel Generic drug: Menthol (Topical Analgesic) Apply 1 Application topically every 4 (four) hours as needed (neck and shoulder pain).   Eucerin Itch Relief 0.1 % Lotn Generic drug: Menthol (Topical Analgesic) Apply 1 Application topically in the morning and at bedtime. Apply to lower legs   Calcium + Vitamin D3 500-5 MG-MCG Tabs Generic drug: Calcium Carb-Cholecalciferol Take 1 tablet by mouth daily.   cephALEXin 500 MG capsule Commonly  known as: KEFLEX Take 1 capsule (500 mg total) by mouth 2 (two) times daily. Take one in evening of 10/26, and one in morning of 10/27 to complete course for UTI.   clopidogrel 75 MG tablet Commonly known as: PLAVIX Take 1 tablet (75 mg total) by mouth daily.   EPINEPHrine 0.3 mg/0.3 mL Soaj injection Commonly known as: EPI-PEN Inject 0.3 mg into the muscle once as needed (severe allergic reaction).   escitalopram 5 MG tablet Commonly known as: LEXAPRO Take 10 mg by mouth daily.   ezetimibe 10 MG tablet Commonly known as: ZETIA Take 1 tablet (10 mg total) by mouth daily. Start taking on: September 11, 2023   hydrochlorothiazide 12.5 MG tablet Commonly known as: HYDRODIURIL Take 12.5 mg by mouth daily.   insulin detemir 100 UNIT/ML injection Commonly known as: Levemir Inject 0.05 mLs (5 Units total) into the skin at bedtime. What changed:  how much to take when to take this   memantine 5 MG tablet Commonly known as: NAMENDA Take 5 mg by mouth at bedtime.   memantine 10 MG tablet Commonly known as: NAMENDA Take 10 mg by mouth in the morning.   metoprolol succinate 50 MG 24 hr tablet Commonly known as: TOPROL-XL Take 100 mg by mouth daily.   mirtazapine 15 MG tablet Commonly known as: REMERON Take 7.5 mg by mouth at bedtime.   OLANZapine injection Commonly known as: ZyPREXA Inject 0.5 mLs (2.5 mg total) into the muscle daily as  Psychiatric:        Mood and Affect: Mood normal.      Pertinent Labs, Studies, and Procedures:     Latest Ref Rng & Units 09/09/2023    1:59 PM 09/08/2023    6:03 AM 09/07/2023    8:38 AM  CBC  WBC 4.0 - 10.5 K/uL 9.6  9.5  8.9   Hemoglobin 13.0 - 17.0 g/dL 87.5  64.3  32.9   Hematocrit 39.0 - 52.0 % 33.9  31.9  32.8   Platelets 150 - 400 K/uL 221  214  213        Latest Ref Rng & Units 09/09/2023    1:59 PM 09/08/2023    6:03 AM 09/07/2023    8:38 AM  CMP  Glucose 70 - 99 mg/dL 518  841  660   BUN 8 - 23 mg/dL 18  20  18    Creatinine 0.61 - 1.24 mg/dL 6.30  1.60  1.09   Sodium 135 - 145 mmol/L 137  137  137   Potassium 3.5 - 5.1 mmol/L 3.9  3.8  3.3   Chloride 98 - 111 mmol/L 105  106  103   CO2 22 - 32 mmol/L 21  20  23    Calcium 8.9 - 10.3 mg/dL 8.9  8.6  8.9     VAS US CAROTID  Result Date: 09/07/2023 Carotid Arterial Duplex Study Patient Name:  Duane [name]  Date of Exam:   09/07/2023 Medical Rec #: [id number]        Accession #:    [id number] Date of Birth: 1935-06-04        Patient Gender: M Patient Age:   88 years Exam Location:  [facility] Procedure:      VAS US CAROTID Referring Phys: Terrilee Files [facility] --------------------------------------------------------------------------------  Indications:       Multiple small acute infarcts in the left corona radiata.                    Slurred speech, weakness'. Risk Factors:      Hypertension, hyperlipidemia, Diabetes. Comparison Study:  none Performing Technologist: Jeb Levering RDMS, RVT  Examination Guidelines: A complete evaluation includes B-mode imaging, spectral  Doppler, color Doppler, and power Doppler as needed of all accessible portions of each vessel. Bilateral testing is considered an integral part of a complete examination. Limited examinations for reoccurring indications may be performed as noted.  Right Carotid Findings: +----------+--------+--------+--------+------------------+--------+           PSV cm/sEDV cm/sStenosisPlaque DescriptionComments +----------+--------+--------+--------+------------------+--------+ CCA Prox  60      13                                         +----------+--------+--------+--------+------------------+--------+ CCA Distal74      10                                         +----------+--------+--------+--------+------------------+--------+ ICA Prox  42      15      1-39%                              +----------+--------+--------+--------+------------------+--------+ ICA Distal88      20                                         +----------+--------+--------+--------+------------------+--------+  Name: DUB SOLTANI MRN: [id number] DOB: Apr 08, 1935 88 y.o. PCP: Nadara Eaton, MD  Date of Admission: 09/06/2023  9:26 AM Date of Discharge:  09/10/23 Attending Physician: Dr. Oswaldo Done  DISCHARGE DIAGNOSIS:  Primary Problem: Acute cerebral infarction [facility])   Hospital Problems: Active Problems:   PAF (paroxysmal atrial fibrillation) (HCC)   Diabetes mellitus type 2, controlled (HCC)   CKD (chronic kidney disease), stage III (HCC)   Vascular dementia, mild, with mood disturbance (HCC)   Cerebral infarction (HCC)    DISCHARGE MEDICATIONS:   Allergies as of 09/10/2023       Reactions   Baclofen Other (See Comments)   Severe AMS, comatose, intubated   Bee Venom Anaphylaxis   Contrast Media [iodinated Contrast Media] Anaphylaxis   Iodine-131 Anaphylaxis   Metrizamide Anaphylaxis   Metronidazole Anaphylaxis   Atorvastatin Other (See Comments)   Aching in legs   Lisinopril Cough        Medication List     STOP taking these medications    cyclobenzaprine 5 MG tablet Commonly known as: FLEXERIL   LORazepam 1 MG tablet Commonly known as: ATIVAN   nitroGLYCERIN 0.4 MG SL tablet Commonly known as: NITROSTAT       TAKE these medications    amLODipine 5 MG tablet Commonly known as: NORVASC Take 5 mg by mouth in the morning and at bedtime.   Biofreeze Cool The Pain 4 % Gel Generic drug: Menthol (Topical Analgesic) Apply 1 Application topically in the morning, at noon, and at bedtime.   Biofreeze Cool The Pain 4 % Gel Generic drug: Menthol (Topical Analgesic) Apply 1 Application topically every 4 (four) hours as needed (neck and shoulder pain).   Eucerin Itch Relief 0.1 % Lotn Generic drug: Menthol (Topical Analgesic) Apply 1 Application topically in the morning and at bedtime. Apply to lower legs   Calcium + Vitamin D3 500-5 MG-MCG Tabs Generic drug: Calcium Carb-Cholecalciferol Take 1 tablet by mouth daily.   cephALEXin 500 MG capsule Commonly  known as: KEFLEX Take 1 capsule (500 mg total) by mouth 2 (two) times daily. Take one in evening of 10/26, and one in morning of 10/27 to complete course for UTI.   clopidogrel 75 MG tablet Commonly known as: PLAVIX Take 1 tablet (75 mg total) by mouth daily.   EPINEPHrine 0.3 mg/0.3 mL Soaj injection Commonly known as: EPI-PEN Inject 0.3 mg into the muscle once as needed (severe allergic reaction).   escitalopram 5 MG tablet Commonly known as: LEXAPRO Take 10 mg by mouth daily.   ezetimibe 10 MG tablet Commonly known as: ZETIA Take 1 tablet (10 mg total) by mouth daily. Start taking on: September 11, 2023   hydrochlorothiazide 12.5 MG tablet Commonly known as: HYDRODIURIL Take 12.5 mg by mouth daily.   insulin detemir 100 UNIT/ML injection Commonly known as: Levemir Inject 0.05 mLs (5 Units total) into the skin at bedtime. What changed:  how much to take when to take this   memantine 5 MG tablet Commonly known as: NAMENDA Take 5 mg by mouth at bedtime.   memantine 10 MG tablet Commonly known as: NAMENDA Take 10 mg by mouth in the morning.   metoprolol succinate 50 MG 24 hr tablet Commonly known as: TOPROL-XL Take 100 mg by mouth daily.   mirtazapine 15 MG tablet Commonly known as: REMERON Take 7.5 mg by mouth at bedtime.   OLANZapine injection Commonly known as: ZyPREXA Inject 0.5 mLs (2.5 mg total) into the muscle daily as

## 2023-09-10 NOTE — Discharge Instructions (Signed)
To [name] or their caretakers,  They were admitted to [facility] on 09/06/2023 for evaluation and treatment of:  Active Problems:   PAF (paroxysmal atrial fibrillation) (HCC)   Diabetes mellitus type 2, controlled (HCC)   CKD (chronic kidney disease), stage III (HCC)   Vascular dementia, mild, with mood disturbance (HCC)   Cerebral infarction (HCC)  Principal Problem (Resolved):   Acute cerebral infarction [facility]) Resolved Problems:   UTI (urinary tract infection)   Acute delirium  The evaluation suggested stroke. They were treated with blood thinners and monitoring.  They were discharged from the hospital on 09/10/23. I recommend the following after leaving the hospital:   Based on conversations, we will not continue blood thinners on discharge because of the unacceptable risk of bleeding.  We started a new cholesterol medicine called ezetimibe.  Take this daily.  Continue taking Plavix.  Take a dose of cephalexin in the evening of 09/10/2023, and then another dose on the morning of 09/11/2023 to complete a course of antibiotics for UTI.  We recommend discontinuing lorazepam because of its side effects in elderly people.  This medicine was discontinued in the hospital and not used for treatment of agitation, he did well without this medicine.  Continue nightly Seroquel.  Continue other medications per medication list in after visit summary.  For questions about your care plan, until you are able to see your primary doctor: Call [phone]. Dial 0 for the operator. Ask for the internal medicine resident on call.  Marrianne Mood MD 09/10/2023, 12:00 PM

## 2023-09-10 NOTE — [place] FL2 (Signed)
[place] MEDICAID FL2 LEVEL OF CARE FORM     IDENTIFICATION  Patient Name: Duane [name] Birthdate: June 17, 1935 Sex: male Admission Date (Current Location): 09/06/2023  [facility] and [place] Number:  Producer, television/film/video and Address:  [facility] [place], [address] [place], [place] [address]      Provider Number: [id number]  Attending Physician Name and Address:  Tyson Alias, *  Relative Name and Phone Number:  son, Edwen Rocchio [phone]    Current Level of Care: Hospital Recommended Level of Care: Memory Care Prior Approval Number:    Date Approved/Denied:   PASRR Number:    Discharge Plan: Other (Comment) (Memory Care)    Current Diagnoses: Patient Active Problem List   Diagnosis Date Noted   Cerebral infarction (HCC) 09/07/2023   Closed cervical spine fracture (HCC) 02/02/2023   Status post surgery 02/01/2023   OSA (obstructive sleep apnea)    Vascular dementia, mild, with mood disturbance (HCC) 08/15/2021   Overweight (BMI 25.0-29.9) 05/12/2021   History of CVA (cerebrovascular accident) 05/11/2021   CKD (chronic kidney disease), stage III (HCC) 05/11/2021   Hyperlipidemia 05/11/2021   Hyponatremia 09/05/2019   Diarrhea 09/05/2019   Encephalopathy 07/23/2019   Essential hypertension 09/08/2016   PAF (paroxysmal atrial fibrillation) (HCC) 09/08/2016   Diabetes mellitus type 2, controlled (HCC) 09/08/2016   ARF (acute renal failure) (HCC) 09/08/2016   Normochromic normocytic anemia 09/08/2016   Abdominal pain 09/08/2016    Orientation RESPIRATION BLADDER Height & Weight     Self  Normal Incontinent, External catheter (currently with condom cath) Weight: 201 lb 8 oz (91.4 kg) Height:  5\' 11"  (180.3 cm)  BEHAVIORAL SYMPTOMS/MOOD NEUROLOGICAL BOWEL NUTRITION STATUS      Continent Diet  AMBULATORY STATUS COMMUNICATION OF NEEDS Skin   Supervision Verbally Normal                       Personal Care Assistance Level  of Assistance  Bathing, Feeding, Dressing Bathing Assistance: Limited assistance Feeding assistance: Limited assistance Dressing Assistance: Limited assistance     Functional Limitations Info  Sight, Hearing, Speech Sight Info: Adequate Hearing Info: Adequate Speech Info: Adequate    SPECIAL CARE FACTORS FREQUENCY                       Contractures Contractures Info: Not present    Additional Factors Info  Code Status, Allergies, Psychotropic Code Status Info: Full Allergies Info: Baclofen, Bee Venom, Contrast Media (Iodinated Contrast Media), Iodine-131, Metrizamide, Metronidazole, Atorvastatin, Lisinopril Psychotropic Info: see MAR         Current Medications (09/10/2023):  This is the current hospital active medication list Current Facility-Administered Medications  Medication Dose Route Frequency Provider Last Rate Last Admin   acetaminophen (TYLENOL) tablet 650 mg  650 mg Oral Q6H PRN Marrianne Mood, MD   650 mg at 09/10/23 0541   amLODipine (NORVASC) tablet 5 mg  5 mg Oral Daily Marrianne Mood, MD   5 mg at 09/10/23 1030   apixaban (ELIQUIS) tablet 2.5 mg  2.5 mg Oral BID Marrianne Mood, MD   2.5 mg at 09/10/23 1030   cephALEXin (KEFLEX) capsule 500 mg  500 mg Oral Q12H Marrianne Mood, MD   500 mg at 09/10/23 1030   clopidogrel (PLAVIX) tablet 75 mg  75 mg Oral Daily Marrianne Mood, MD   75 mg at 09/10/23 1030   docusate sodium (COLACE) capsule 100 mg  100  mg Oral BID Marrianne Mood, MD   100 mg at 09/10/23 1048   escitalopram (LEXAPRO) tablet 10 mg  10 mg Oral Daily Marrianne Mood, MD   10 mg at 09/10/23 1030   ezetimibe (ZETIA) tablet 10 mg  10 mg Oral Daily Katheran James, DO   10 mg at 09/10/23 1030   insulin detemir (LEVEMIR) injection 6 Units  6 Units Subcutaneous QHS Katheran James, DO   6 Units at 09/09/23 2210   memantine (NAMENDA) tablet 10 mg  10 mg Oral Daily Marrianne Mood, MD   10 mg at 09/10/23 1030   memantine  (NAMENDA) tablet 5 mg  5 mg Oral QHS Marrianne Mood, MD   5 mg at 09/09/23 2002   metoprolol succinate (TOPROL-XL) 24 hr tablet 100 mg  100 mg Oral Daily Marrianne Mood, MD   100 mg at 09/10/23 1030   mirtazapine (REMERON) tablet 7.5 mg  7.5 mg Oral QHS Marrianne Mood, MD   7.5 mg at 09/09/23 2002   Oral care mouth rinse  15 mL Mouth Rinse PRN Inez Catalina, MD       polyethylene glycol (MIRALAX / GLYCOLAX) packet 17 g  17 g Oral Daily PRN Marrianne Mood, MD       QUEtiapine (SEROQUEL) tablet 50 mg  50 mg Oral QHS Katheran James, DO   50 mg at 09/09/23 2002   tamsulosin (FLOMAX) capsule 0.4 mg  0.4 mg Oral QHS Marrianne Mood, MD   0.4 mg at 09/09/23 2002     Discharge Medications: Please see discharge summary for a list of discharge medications.  Relevant Imaging Results:  Relevant Lab Results:   Additional Information SS#: [id number]  Amada Jupiter, LCSW

## 2023-09-10 NOTE — TOC Progression Note (Addendum)
Transition of Care [facility]) - Progression Note    Patient Details  Name: Duane [name] MRN: [id number] Date of Birth: [birth year]  Transition of Care [facility]) CM/SW Contact  Inis Sizer, LCSW Phone Number: 09/10/2023, 9:33 AM  Clinical Narrative:    CSW spoke with [place], Charity fundraiser at [employer] who states she confirmed that patient can return today. Patient will go to the [facility] [place] wing at the facility. CSW will fax discharge summary and AVS once finalized by MD. The number to call for report is [phone].  Per RN, patient's family called and stated they would transport patient back to the facility after 11:30am.  ADDENDUM:   DC summary and FL2 faxed to [facility] at [facility].  Pt discharge via [employer].   Expected Discharge Plan: Assisted Living Barriers to Discharge: Other (must enter comment) (Facility wants pt to return when they have full staffing on Monday due to recent agitation.)  Expected Discharge Plan and Services     Post Acute Care Choice: Nursing Home Living arrangements for the past 2 months: Assisted Living Facility                                       Social Determinants of Health (SDOH) Interventions SDOH Screenings   Food Insecurity: No Food Insecurity (09/06/2023)  Housing: Low Risk  (09/06/2023)  Transportation Needs: No Transportation Needs (09/06/2023)  Utilities: Not At Risk (09/06/2023)  Tobacco Use: Low Risk  (09/06/2023)    Readmission Risk Interventions     No data to display

## 2023-09-10 NOTE — Plan of Care (Signed)
  Problem: Education: Goal: Knowledge of General Education information will improve Description: Including pain rating scale, medication(s)/side effects and non-pharmacologic comfort measures Outcome: Not Applicable   Problem: Health Behavior/Discharge Planning: Goal: Ability to manage health-related needs will improve Outcome: Not Applicable   Problem: Clinical Measurements: Goal: Ability to maintain clinical measurements within normal limits will improve Outcome: Not Applicable Goal: Will remain free from infection Outcome: Not Applicable Goal: Diagnostic test results will improve Outcome: Not Applicable Goal: Respiratory complications will improve Outcome: Not Applicable Goal: Cardiovascular complication will be avoided Outcome: Not Applicable   Problem: Activity: Goal: Risk for activity intolerance will decrease Outcome: Not Applicable   Problem: Nutrition: Goal: Adequate nutrition will be maintained Outcome: Not Applicable   Problem: Coping: Goal: Level of anxiety will decrease Outcome: Not Applicable   Problem: Elimination: Goal: Will not experience complications related to bowel motility Outcome: Not Applicable Goal: Will not experience complications related to urinary retention Outcome: Not Applicable   Problem: Pain Management: Goal: General experience of comfort will improve Outcome: Not Applicable   Problem: Safety: Goal: Ability to remain free from injury will improve Outcome: Not Applicable   Problem: Skin Integrity: Goal: Risk for impaired skin integrity will decrease Outcome: Not Applicable   Problem: Education: Goal: Ability to describe self-care measures that may prevent or decrease complications (Diabetes Survival Skills Education) will improve Outcome: Not Applicable Goal: Individualized Educational Video(s) Outcome: Not Applicable   Problem: Coping: Goal: Ability to adjust to condition or change in health will improve Outcome: Not  Applicable   Problem: Fluid Volume: Goal: Ability to maintain a balanced intake and output will improve Outcome: Not Applicable   Problem: Health Behavior/Discharge Planning: Goal: Ability to identify and utilize available resources and services will improve Outcome: Not Applicable Goal: Ability to manage health-related needs will improve Outcome: Not Applicable   Problem: Metabolic: Goal: Ability to maintain appropriate glucose levels will improve Outcome: Not Applicable   Problem: Nutritional: Goal: Maintenance of adequate nutrition will improve Outcome: Not Applicable Goal: Progress toward achieving an optimal weight will improve Outcome: Not Applicable   Problem: Skin Integrity: Goal: Risk for impaired skin integrity will decrease Outcome: Not Applicable   Problem: Tissue Perfusion: Goal: Adequacy of tissue perfusion will improve Outcome: Not Applicable   Problem: Safety: Goal: Non-violent Restraint(s) Outcome: Not Applicable

## 2023-09-12 ENCOUNTER — Emergency Department (HOSPITAL_COMMUNITY): Payer: Medicare Other

## 2023-09-12 ENCOUNTER — Emergency Department (HOSPITAL_COMMUNITY)
Admission: EM | Admit: 2023-09-12 | Discharge: 2023-09-12 | Disposition: A | Discharge status: Skilled Nursing Facility | Payer: Medicare Other | Attending: Emergency Medicine | Admitting: Emergency Medicine

## 2023-09-12 DIAGNOSIS — W19XXXA Unspecified fall, initial encounter: Secondary | ICD-10-CM | POA: Diagnosis not present

## 2023-09-12 DIAGNOSIS — S0990XA Unspecified injury of head, initial encounter: Secondary | ICD-10-CM | POA: Diagnosis not present

## 2023-09-12 DIAGNOSIS — M25551 Pain in right hip: Secondary | ICD-10-CM | POA: Insufficient documentation

## 2023-09-12 MED ORDER — ACETAMINOPHEN 500 MG PO TABS
1000.0000 mg | ORAL_TABLET | Freq: Once | ORAL | Status: AC
Start: 2023-09-12 — End: 2023-09-12
  Administered 2023-09-12: 1000 mg via ORAL
  Filled 2023-09-12: qty 2

## 2023-09-12 NOTE — ED Notes (Signed)
Patient transported to CT with primary nurse

## 2023-09-12 NOTE — ED Triage Notes (Signed)
168/48 66 97 RA 162 CBG No deficits noted by staff Aox2 at baseline Plavix  Hx UTI  Pt came from [facility] facility. Around 1030 staff found him after an unwitnessed mechanical fall. Found with left side of head against bed frame and had an indentation where his head was leaning. PT c/o right hip pain

## 2023-09-12 NOTE — ED Notes (Signed)
Patient returned from CT

## 2023-09-12 NOTE — Progress Notes (Signed)
Orthopedic Tech Progress Note Patient Details:  Duane [name] December 20, 1934 [id number] Level 2 Trauma. Not needed Patient ID: [name], male   DOB: [birth year], 88 y.o.   MRN: [id number]  [name] 09/12/2023, 1:35 PM

## 2023-09-12 NOTE — Discharge Instructions (Addendum)
Return for any problem.  ?

## 2023-09-12 NOTE — ED Notes (Signed)
Pt's daughter given update over the phone. She successfully verified pt's name and DOB.
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
## 2023-09-12 NOTE — Progress Notes (Addendum)
Chaplain responded to Level II trauma page. Pt was under the care of the medical team and unable to meet with chaplain at this time. No family present at the bedside.   Chaplain is available for follow up should family or patient desire further support. Please page as further needs arise.    Maryanna Shape. Carley Hammed, M.Div. [facility] Chaplain Pager [phone] Office [phone]

## 2023-09-12 NOTE — ED Notes (Signed)
[place] transportation called to report that they will arrive in 45+ minutes to pick up the patient. The message was also relayed to his nurse via secure chat.

## 2023-09-12 NOTE — ED Notes (Addendum)
Patient's daughter, Kendell Bane, updated.

## 2023-10-29 ENCOUNTER — Emergency Department (HOSPITAL_COMMUNITY): Payer: Medicare Other

## 2023-10-29 ENCOUNTER — Observation Stay (HOSPITAL_COMMUNITY): Payer: Medicare Other

## 2023-10-29 ENCOUNTER — Other Ambulatory Visit: Payer: Self-pay

## 2023-10-29 ENCOUNTER — Encounter (HOSPITAL_COMMUNITY): Payer: Self-pay | Admitting: *Deleted

## 2023-10-29 ENCOUNTER — Inpatient Hospital Stay (HOSPITAL_COMMUNITY)
Admission: EM | Admit: 2023-10-29 | Discharge: 2023-11-02 | DRG: 065 | Disposition: A | Discharge status: Hospice | Payer: Medicare Other | Source: Skilled Nursing Facility | Attending: Internal Medicine | Admitting: Internal Medicine

## 2023-10-29 DIAGNOSIS — I63431 Cerebral infarction due to embolism of right posterior cerebral artery: Principal | ICD-10-CM | POA: Diagnosis present

## 2023-10-29 DIAGNOSIS — D649 Anemia, unspecified: Secondary | ICD-10-CM | POA: Diagnosis present

## 2023-10-29 DIAGNOSIS — Z79899 Other long term (current) drug therapy: Secondary | ICD-10-CM

## 2023-10-29 DIAGNOSIS — I129 Hypertensive chronic kidney disease with stage 1 through stage 4 chronic kidney disease, or unspecified chronic kidney disease: Secondary | ICD-10-CM | POA: Diagnosis present

## 2023-10-29 DIAGNOSIS — R29706 NIHSS score 6: Secondary | ICD-10-CM | POA: Diagnosis present

## 2023-10-29 DIAGNOSIS — I252 Old myocardial infarction: Secondary | ICD-10-CM

## 2023-10-29 DIAGNOSIS — R29712 NIHSS score 12: Secondary | ICD-10-CM | POA: Diagnosis not present

## 2023-10-29 DIAGNOSIS — Z794 Long term (current) use of insulin: Secondary | ICD-10-CM

## 2023-10-29 DIAGNOSIS — Z955 Presence of coronary angioplasty implant and graft: Secondary | ICD-10-CM

## 2023-10-29 DIAGNOSIS — R1310 Dysphagia, unspecified: Secondary | ICD-10-CM | POA: Diagnosis present

## 2023-10-29 DIAGNOSIS — R000 Tachycardia, unspecified: Secondary | ICD-10-CM | POA: Diagnosis present

## 2023-10-29 DIAGNOSIS — Z8249 Family history of ischemic heart disease and other diseases of the circulatory system: Secondary | ICD-10-CM

## 2023-10-29 DIAGNOSIS — N1831 Chronic kidney disease, stage 3a: Secondary | ICD-10-CM | POA: Diagnosis present

## 2023-10-29 DIAGNOSIS — Z7902 Long term (current) use of antithrombotics/antiplatelets: Secondary | ICD-10-CM

## 2023-10-29 DIAGNOSIS — Z66 Do not resuscitate: Secondary | ICD-10-CM | POA: Diagnosis not present

## 2023-10-29 DIAGNOSIS — E860 Dehydration: Secondary | ICD-10-CM | POA: Diagnosis present

## 2023-10-29 DIAGNOSIS — I4891 Unspecified atrial fibrillation: Secondary | ICD-10-CM | POA: Insufficient documentation

## 2023-10-29 DIAGNOSIS — Z91041 Radiographic dye allergy status: Secondary | ICD-10-CM

## 2023-10-29 DIAGNOSIS — R471 Dysarthria and anarthria: Secondary | ICD-10-CM | POA: Diagnosis present

## 2023-10-29 DIAGNOSIS — R29810 Facial weakness: Secondary | ICD-10-CM | POA: Diagnosis present

## 2023-10-29 DIAGNOSIS — R9082 White matter disease, unspecified: Secondary | ICD-10-CM | POA: Diagnosis present

## 2023-10-29 DIAGNOSIS — R569 Unspecified convulsions: Secondary | ICD-10-CM

## 2023-10-29 DIAGNOSIS — R4701 Aphasia: Secondary | ICD-10-CM | POA: Diagnosis present

## 2023-10-29 DIAGNOSIS — J180 Bronchopneumonia, unspecified organism: Secondary | ICD-10-CM | POA: Diagnosis present

## 2023-10-29 DIAGNOSIS — N390 Urinary tract infection, site not specified: Secondary | ICD-10-CM | POA: Diagnosis present

## 2023-10-29 DIAGNOSIS — G4733 Obstructive sleep apnea (adult) (pediatric): Secondary | ICD-10-CM | POA: Diagnosis present

## 2023-10-29 DIAGNOSIS — R4182 Altered mental status, unspecified: Principal | ICD-10-CM

## 2023-10-29 DIAGNOSIS — G8194 Hemiplegia, unspecified affecting left nondominant side: Secondary | ICD-10-CM | POA: Diagnosis present

## 2023-10-29 DIAGNOSIS — Z7401 Bed confinement status: Secondary | ICD-10-CM

## 2023-10-29 DIAGNOSIS — F0390 Unspecified dementia without behavioral disturbance: Secondary | ICD-10-CM | POA: Diagnosis present

## 2023-10-29 DIAGNOSIS — E1122 Type 2 diabetes mellitus with diabetic chronic kidney disease: Secondary | ICD-10-CM | POA: Diagnosis present

## 2023-10-29 DIAGNOSIS — M1990 Unspecified osteoarthritis, unspecified site: Secondary | ICD-10-CM | POA: Diagnosis present

## 2023-10-29 DIAGNOSIS — N400 Enlarged prostate without lower urinary tract symptoms: Secondary | ICD-10-CM | POA: Diagnosis present

## 2023-10-29 DIAGNOSIS — Z993 Dependence on wheelchair: Secondary | ICD-10-CM

## 2023-10-29 DIAGNOSIS — I10 Essential (primary) hypertension: Secondary | ICD-10-CM | POA: Diagnosis present

## 2023-10-29 DIAGNOSIS — G9349 Other encephalopathy: Secondary | ICD-10-CM | POA: Diagnosis present

## 2023-10-29 DIAGNOSIS — Z91030 Bee allergy status: Secondary | ICD-10-CM

## 2023-10-29 DIAGNOSIS — N183 Chronic kidney disease, stage 3 unspecified: Secondary | ICD-10-CM | POA: Diagnosis present

## 2023-10-29 DIAGNOSIS — E119 Type 2 diabetes mellitus without complications: Secondary | ICD-10-CM

## 2023-10-29 DIAGNOSIS — Z515 Encounter for palliative care: Secondary | ICD-10-CM

## 2023-10-29 DIAGNOSIS — I639 Cerebral infarction, unspecified: Secondary | ICD-10-CM

## 2023-10-29 DIAGNOSIS — I480 Paroxysmal atrial fibrillation: Secondary | ICD-10-CM | POA: Diagnosis present

## 2023-10-29 DIAGNOSIS — Z7984 Long term (current) use of oral hypoglycemic drugs: Secondary | ICD-10-CM

## 2023-10-29 DIAGNOSIS — E785 Hyperlipidemia, unspecified: Secondary | ICD-10-CM | POA: Diagnosis present

## 2023-10-29 DIAGNOSIS — D631 Anemia in chronic kidney disease: Secondary | ICD-10-CM | POA: Diagnosis present

## 2023-10-29 DIAGNOSIS — R414 Neurologic neglect syndrome: Secondary | ICD-10-CM | POA: Diagnosis present

## 2023-10-29 DIAGNOSIS — I2510 Atherosclerotic heart disease of native coronary artery without angina pectoris: Secondary | ICD-10-CM | POA: Diagnosis present

## 2023-10-29 DIAGNOSIS — G9340 Encephalopathy, unspecified: Secondary | ICD-10-CM | POA: Diagnosis present

## 2023-10-29 DIAGNOSIS — R29715 NIHSS score 15: Secondary | ICD-10-CM | POA: Diagnosis not present

## 2023-10-29 DIAGNOSIS — Z85819 Personal history of malignant neoplasm of unspecified site of lip, oral cavity, and pharynx: Secondary | ICD-10-CM

## 2023-10-29 DIAGNOSIS — R29717 NIHSS score 17: Secondary | ICD-10-CM | POA: Diagnosis not present

## 2023-10-29 DIAGNOSIS — Z87440 Personal history of urinary (tract) infections: Secondary | ICD-10-CM

## 2023-10-29 LAB — CBC
HCT: 34.3 % — ABNORMAL LOW (ref 39.0–52.0)
Hemoglobin: 11 g/dL — ABNORMAL LOW (ref 13.0–17.0)
MCH: 26.8 pg (ref 26.0–34.0)
MCHC: 32.1 g/dL (ref 30.0–36.0)
MCV: 83.5 fL (ref 80.0–100.0)
Platelets: 208 10*3/uL (ref 150–400)
RBC: 4.11 MIL/uL — ABNORMAL LOW (ref 4.22–5.81)
RDW: 15 % (ref 11.5–15.5)
WBC: 7.8 10*3/uL (ref 4.0–10.5)
nRBC: 0 % (ref 0.0–0.2)

## 2023-10-29 LAB — COMPREHENSIVE METABOLIC PANEL
ALT: 10 U/L (ref 0–44)
AST: 16 U/L (ref 15–41)
Albumin: 3.5 g/dL (ref 3.5–5.0)
Alkaline Phosphatase: 70 U/L (ref 38–126)
Anion gap: 12 (ref 5–15)
BUN: 22 mg/dL (ref 8–23)
CO2: 22 mmol/L (ref 22–32)
Calcium: 9.2 mg/dL (ref 8.9–10.3)
Chloride: 102 mmol/L (ref 98–111)
Creatinine, Ser: 1.89 mg/dL — ABNORMAL HIGH (ref 0.61–1.24)
GFR, Estimated: 34 mL/min — ABNORMAL LOW (ref >60)
Glucose, Bld: 133 mg/dL — ABNORMAL HIGH (ref 70–99)
Potassium: 3.8 mmol/L (ref 3.5–5.1)
Sodium: 136 mmol/L (ref 135–145)
Total Bilirubin: 0.7 mg/dL (ref <1.2)
Total Protein: 7.3 g/dL (ref 6.5–8.1)

## 2023-10-29 LAB — RESPIRATORY PANEL BY PCR

## 2023-10-29 LAB — AMMONIA: Ammonia: 22 umol/L (ref 9–35)

## 2023-10-29 LAB — TSH: TSH: 1.179 u[IU]/mL (ref 0.350–4.500)

## 2023-10-29 LAB — PROCALCITONIN: Procalcitonin: <0.1 ng/mL

## 2023-10-29 LAB — URINALYSIS, ROUTINE W REFLEX MICROSCOPIC
Bacteria, UA: NONE SEEN
Bilirubin Urine: NEGATIVE
Glucose, UA: NEGATIVE mg/dL
Ketones, ur: NEGATIVE mg/dL
Nitrite: NEGATIVE
Protein, ur: NEGATIVE mg/dL
Specific Gravity, Urine: 1.006 (ref 1.005–1.030)
pH: 7 (ref 5.0–8.0)

## 2023-10-29 LAB — PROTIME-INR
INR: 1 (ref 0.8–1.2)
Prothrombin Time: 13.5 s (ref 11.4–15.2)

## 2023-10-29 LAB — I-STAT CHEM 8, ED
BUN: 23 mg/dL (ref 8–23)
Calcium, Ion: 1.09 mmol/L — ABNORMAL LOW (ref 1.15–1.40)
Chloride: 106 mmol/L (ref 98–111)
Creatinine, Ser: 2.1 mg/dL — ABNORMAL HIGH (ref 0.61–1.24)
Glucose, Bld: 115 mg/dL — ABNORMAL HIGH (ref 70–99)
HCT: 34 % — ABNORMAL LOW (ref 39.0–52.0)
Hemoglobin: 11.6 g/dL — ABNORMAL LOW (ref 13.0–17.0)
Potassium: 4 mmol/L (ref 3.5–5.1)
Sodium: 139 mmol/L (ref 135–145)
TCO2: 21 mmol/L — ABNORMAL LOW (ref 22–32)

## 2023-10-29 LAB — DIFFERENTIAL
Abs Immature Granulocytes: 0.03 10*3/uL (ref 0.00–0.07)
Basophils Absolute: 0.1 10*3/uL (ref 0.0–0.1)
Basophils Relative: 1 %
Eosinophils Absolute: 0.3 10*3/uL (ref 0.0–0.5)
Eosinophils Relative: 4 %
Immature Granulocytes: 0 %
Lymphocytes Relative: 32 %
Lymphs Abs: 2.5 10*3/uL (ref 0.7–4.0)
Monocytes Absolute: 0.7 10*3/uL (ref 0.1–1.0)
Monocytes Relative: 9 %
Neutro Abs: 4.2 10*3/uL (ref 1.7–7.7)
Neutrophils Relative %: 54 %

## 2023-10-29 LAB — CBG MONITORING, ED
Glucose-Capillary: 111 mg/dL — ABNORMAL HIGH (ref 70–99)
Glucose-Capillary: 114 mg/dL — ABNORMAL HIGH (ref 70–99)

## 2023-10-29 LAB — SARS CORONAVIRUS 2 BY RT PCR: SARS Coronavirus 2 by RT PCR: NEGATIVE

## 2023-10-29 LAB — ETHANOL: Alcohol, Ethyl (B): <10 mg/dL (ref <10)

## 2023-10-29 LAB — APTT: aPTT: 28 s (ref 24–36)

## 2023-10-29 LAB — I-STAT CG4 LACTIC ACID, ED: Lactic Acid, Venous: 0.8 mmol/L (ref 0.5–1.9)

## 2023-10-29 MED ORDER — ASPIRIN 81 MG PO TBEC: 81.0000 mg | DELAYED_RELEASE_TABLET | Freq: Every day | ORAL | Status: DC

## 2023-10-29 MED ORDER — ENOXAPARIN SODIUM 30 MG/0.3ML IJ SOSY
30.0000 mg | PREFILLED_SYRINGE | INTRAMUSCULAR | Status: DC
  Administered 2023-10-29: 30 mg via SUBCUTANEOUS
  Filled 2023-10-29: qty 0.3

## 2023-10-29 MED ORDER — ACETAMINOPHEN 500 MG PO TABS: 1000.0000 mg | ORAL_TABLET | Freq: Three times a day (TID) | ORAL | Status: DC | PRN

## 2023-10-29 MED ORDER — CLOPIDOGREL BISULFATE 75 MG PO TABS: 75.0000 mg | ORAL_TABLET | Freq: Every day | ORAL | Status: DC

## 2023-10-29 MED ORDER — SODIUM CHLORIDE 0.9 % IV SOLN
1.0000 g | INTRAVENOUS | Status: DC
Start: 2023-10-29 — End: 2023-11-01
  Administered 2023-10-29 – 2023-10-31 (×3): 1 g via INTRAVENOUS
  Filled 2023-10-29 (×3): qty 10

## 2023-10-29 MED ORDER — LACTATED RINGERS IV BOLUS
1000.0000 mL | Freq: Once | INTRAVENOUS | Status: AC
  Administered 2023-10-29: 1000 mL via INTRAVENOUS

## 2023-10-29 NOTE — ED Provider Notes (Signed)
[place] EMERGENCY DEPARTMENT AT [facility] Provider Note   CSN: [id number] Arrival date & time: 10/29/23  4098  An emergency department physician performed an initial assessment on this suspected stroke patient at 0608.  History  Chief Complaint  Patient presents with   Code Stroke    Duane [name] is a 88 y.o. male.  88 yo M with a chief complaints of a change to his cognition.  This was noted acutely this evening.  He was brought in as a code stroke.  Airway was cleared at the bridge.  Level 5 caveat acuity of condition.        Home Medications Prior to Admission medications   Medication Sig Start Date End Date Taking? Authorizing Provider  Acetaminophen (TYLENOL 8 HOUR PO) Take 1 tablet by mouth in the morning, at noon, and at bedtime.    [provider]  amLODipine (NORVASC) 5 MG tablet Take 5 mg by mouth in the morning and at bedtime.    [provider]  Calcium Carb-Cholecalciferol (CALCIUM + VITAMIN D3) 500-5 MG-MCG TABS Take 1 tablet by mouth daily.    [provider]  cephALEXin (KEFLEX) 500 MG capsule Take 1 capsule (500 mg total) by mouth 2 (two) times daily. Take one in evening of 10/26, and one in morning of 10/27 to complete course for UTI. Patient not taking: Reported on 09/12/2023 09/10/23   Marrianne Mood, MD  clopidogrel (PLAVIX) 75 MG tablet Take 1 tablet (75 mg total) by mouth daily. 02/11/23   Jadene Pierini, MD  EPINEPHrine 0.3 mg/0.3 mL IJ SOAJ injection Inject 0.3 mg into the muscle once as needed (severe allergic reaction).    [provider]  escitalopram (LEXAPRO) 5 MG tablet Take 10 mg by mouth daily.    [provider]  ezetimibe (ZETIA) 10 MG tablet Take 1 tablet (10 mg total) by mouth daily. 09/11/23   Marrianne Mood, MD  hydrochlorothiazide (HYDRODIURIL) 12.5 MG tablet Take 12.5 mg by mouth daily.    [provider]  insulin detemir (LEVEMIR) 100 UNIT/ML injection  Inject 0.05 mLs (5 Units total) into the skin at bedtime. Patient taking differently: Inject 5 Units into the skin daily in the afternoon. 05/15/21   Hongalgi, Maximino Greenland, MD  memantine (NAMENDA) 10 MG tablet Take 10 mg by mouth 2 (two) times daily.    [provider]  Menthol, Topical Analgesic, (BIOFREEZE COOL THE PAIN) 4 % GEL Apply 1 Application topically in the morning, at noon, and at bedtime.    [provider]  Menthol, Topical Analgesic, (EUCERIN ITCH RELIEF) 0.1 % LOTN Apply 1 Application topically in the morning and at bedtime. Apply to lower legs    [provider]  metoprolol succinate (TOPROL-XL) 50 MG 24 hr tablet Take 100 mg by mouth daily. 10/26/22   [provider]  OLANZapine (ZYPREXA) injection Inject 0.5 mLs (2.5 mg total) into the muscle daily as needed for agitation. Patient not taking: Reported on 09/12/2023 09/10/23   Marrianne Mood, MD  Omega-3 1400 MG CAPS Take 1,400 mg by mouth in the morning and at bedtime.    [provider]  pantoprazole (PROTONIX) 20 MG tablet Take 20 mg by mouth daily.    [provider]  QUEtiapine (SEROQUEL) 25 MG tablet Take 50 mg by mouth at bedtime.    [provider]  sitaGLIPtin-metformin (JANUMET) 50-1000 MG tablet Take 1 tablet by mouth daily.    [provider]  sodium  chloride 1 g tablet Take 1 g by mouth daily.    [provider]  tamsulosin (FLOMAX) 0.4 MG CAPS capsule Take 1 capsule (0.4 mg total) by mouth daily. Patient not taking: Reported on 09/12/2023 02/09/23   Narda Bonds, MD      Allergies    Baclofen, Bee venom, Contrast media [iodinated contrast media], Iodine-131, Metrizamide, Metronidazole, Atorvastatin, and Lisinopril    Review of Systems   Review of Systems  Physical Exam Updated Vital Signs BP (!) 166/59 (BP Location: Right Arm)   Pulse 63   Temp (!) 96.9 F (36.1 C)   Resp 15   Wt 93.6 kg   SpO2 99%   BMI 28.78 kg/m   Physical Exam Vitals and nursing note reviewed.  Constitutional:      Appearance: He is well-developed.  HENT:     Head: Normocephalic and atraumatic.  Eyes:     Pupils: Pupils are equal, round, and reactive to light.  Neck:     Vascular: No JVD.  Cardiovascular:     Rate and Rhythm: Normal rate and regular rhythm.     Heart sounds: No murmur heard.    No friction rub. No gallop.  Pulmonary:     Effort: No respiratory distress.     Breath sounds: No wheezing.  Abdominal:     General: There is no distension.     Tenderness: There is no abdominal tenderness. There is no guarding or rebound.  Musculoskeletal:        General: Normal range of motion.     Cervical back: Normal range of motion and neck supple.  Skin:    Coloration: Skin is not pale.     Findings: No rash.  Neurological:     Mental Status: He is alert.     ED Results / Procedures / Treatments   Labs (all labs ordered are listed, but only abnormal results are displayed) Labs Reviewed  CBC - Abnormal; Notable for the following components:      Result Value   RBC 4.11 (*)    Hemoglobin 11.0 (*)    HCT 34.3 (*)    All other components within normal limits  I-STAT CHEM 8, ED - Abnormal; Notable for the following components:   Creatinine, Ser 2.10 (*)    Glucose, Bld 115 (*)    Calcium, Ion 1.09 (*)    TCO2 21 (*)    Hemoglobin 11.6 (*)    HCT 34.0 (*)    All other components within normal limits  CBG MONITORING, ED - Abnormal; Notable for the following components:   Glucose-Capillary 111 (*)    All other components within normal limits  ETHANOL  PROTIME-INR  APTT  DIFFERENTIAL  COMPREHENSIVE METABOLIC PANEL  RAPID URINE DRUG SCREEN, HOSP PERFORMED  URINALYSIS, ROUTINE W REFLEX MICROSCOPIC  I-STAT CG4 LACTIC ACID, ED    EKG None  Radiology CT HEAD CODE STROKE WO CONTRAST Result Date: 10/29/2023 CLINICAL DATA:  Code stroke. 88 year old male with left side weakness and aphasia. EXAM: CT HEAD  WITHOUT CONTRAST TECHNIQUE: Contiguous axial images were obtained from the base of the skull through the vertex without intravenous contrast. RADIATION DOSE REDUCTION: This exam was performed according to the departmental dose-optimization program which includes automated exposure control, adjustment of the mA and/or kV according to patient size and/or use of iterative reconstruction technique. COMPARISON:  Head CT 09/12/2023.  Brain MRI 09/06/2023. FINDINGS: Brain: Patchy left corona radiata white matter infarcts in October with small areas  of cystic encephalomalacia there now (series 3, image 16). Underlying chronic and confluent periventricular white matter hypodensity otherwise. Ventral thalamic heterogeneity, possible increased perivascular spaces appears stable from the October MRI. No midline shift, ventriculomegaly, mass effect, evidence of mass lesion, intracranial hemorrhage or evidence of cortically based acute infarction. Vascular: Calcified atherosclerosis at the skull base. No suspicious intracranial vascular hyperdensity. Skull: No acute osseous abnormality identified. Sinuses/Orbits: Visualized paranasal sinuses and mastoids are stable and well aerated. Other: No gaze deviation, acute orbit or scalp soft tissue finding. ASPECTS [facility] Stroke Program Early CT Score) Total score (0-10 with 10 being normal): 10 IMPRESSION: 1. No acute cortically based infarct or acute intracranial hemorrhage identified. ASPECTS 10. Chronic cerebral small vessel disease. 2. These results were communicated to Dr. Otelia Limes at 6:21 am on 10/29/2023 by text page via the [facility] messaging system. Electronically Signed   By: Odessa Fleming M.D.   On: 10/29/2023 06:22    Procedures Procedures    Medications Ordered in ED Medications - No data to display  ED Course/ Medical Decision Making/ A&P                                 Medical Decision Making Amount and/or Complexity of Data Reviewed Labs: ordered. Radiology:  ordered.   88 yo M with a chief complaints of altered mental status.  Upon checked this morning he was found to be aphasic with left-sided weakness.  This seems to have resolved on neurology's exam here.    Plan to discuss with medicine for admission.  No significant electrolyte abnormalities.  No anemia. CT of the head without obvious acute intracranial finding.   The patients results and plan were reviewed and discussed.   Any x-rays performed were independently reviewed by myself.   Differential diagnosis were considered with the presenting HPI.  Medications - No data to display  Vitals:   10/29/23 0600 10/29/23 0633  BP:  (!) 166/59  Pulse:  63  Resp:  15  Temp:  (!) 96.9 F (36.1 C)  SpO2:  99%  Weight: 93.6 kg     Final diagnoses:  Altered mental status, unspecified altered mental status type    Admission/ observation were discussed with the admitting physician, patient and/or family and they are comfortable with the plan.           Final Clinical Impression(s) / ED Diagnoses Final diagnoses:  Altered mental status, unspecified altered mental status type    Rx / DC Orders ED Discharge Orders     None         Melene Plan, DO 10/29/23 [phone]

## 2023-10-29 NOTE — Consult Note (Signed)
NEUROLOGY CONSULT NOTE   Date of service: October 29, 2023 Patient Name: Duane [name] MRN:  [id number] DOB:  [birth year] Chief Complaint:  Acute onset of left sided weakness and aphasia Requesting Provider: No att. providers found  History of Present Illness  Duane [name] is a 88 y.o. male  has a past medical history of A-fib (HCC), Anemia, Angina pectoris (HCC), Arthritis, Carpal tunnel syndrome, Cobalamin deficiency, Coronary artery disease, Diabetes mellitus without complication (HCC), Eczema, Hyperlipemia, Hypertension, Kidney disease, chronic, stage III (moderate, EGFR 30-59 ml/min) (HCC), Lung infiltrate, Myocardial infarct, old, Prostatic hypertrophy, Sleep apnea, obstructive, and Squamous cell cancer of lip.,and dementia, who presents to the ED via EMS from his SNF with acute left sided weakness and aphasia. LKN 11 PM at regular check by SNF staff. This AM during a routine check, he was noted to be aphasic with left sided weakness. Per son, due to his dementia he can carry on simple conversations but has significant memory deficits. He is wheelchair-bound per son, only able to stand with assistance to use the bathroom. He is completely dependent on others for his care. He was formerly on Eliquis for his a-fib, but this was stopped. He has a history of coronary artery stent placement and is on Plavix.   LKW: 11 PM Modified rankin score: 5-Severe disability-bedridden, incontinent, needs constant attention IV Thrombolysis: No: Out of the TNK time window.  EVT:  No: Modified Rankin score of 5.   NIHSS components Score: Comment  1a Level of Conscious 0[]  1[]  2[x]  3[]      1b LOC Questions 0[]  1[]  2[x]       1c LOC Commands 0[]  1[]  2[x]       2 Best Gaze 0[x]  1[]  2[]       3 Visual 0[x]  1[]  2[]  3[]      4 Facial Palsy 0[x]  1[]  2[]  3[]      5a Motor Arm - left 0[x]  1[]  2[]  3[]  4[]  UN[]    5b Motor Arm - Right 0[x]  1[]  2[]  3[]  4[]  UN[]    6a Motor Leg - Left 0[x]  1[]  2[]  3[]  4[]  UN[]    6b  Motor Leg - Right 0[x]  1[]  2[]  3[]  4[]  UN[]    7 Limb Ataxia 0[x]  1[]  2[]  3[]  UN[]     8 Sensory 0[x]  1[]  2[]  UN[]      9 Best Language 0[]  1[]  2[x]  3[]      10 Dysarthria 0[]  1[]  2[x]  UN[]      11 Extinct. and Inattention 0[x]  1[]  2[]       TOTAL:  10      ROS  Unable to ascertain due to aphasia  Past History   Past Medical History:  Diagnosis Date   A-fib (HCC)    Anemia    Angina pectoris (HCC)    Arthritis    Carpal tunnel syndrome    Cobalamin deficiency    Coronary artery disease    Diabetes mellitus without complication (HCC)    Eczema    Hyperlipemia    Hypertension    Kidney disease, chronic, stage III (moderate, EGFR 30-59 ml/min) (HCC)    Lung infiltrate    Myocardial infarct, old    Prostatic hypertrophy    Sleep apnea, obstructive    Squamous cell cancer of lip     Past Surgical History:  Procedure Laterality Date   CORONARY ANGIOPLASTY WITH STENT PLACEMENT     POSTERIOR CERVICAL FUSION/FORAMINOTOMY N/A 02/01/2023   Procedure: CERVICAL FIVE - THORACIC ONE LAMINECTOMY, AND INSTRUMENTED FUSION;  Surgeon: Jadene Pierini, MD;  Location: [facility] OR;  Service: Neurosurgery;  Laterality: N/A;    Family History: Family History  Problem Relation Age of Onset   Hypertension Other     Social History  reports that he has never smoked. He has never used smokeless tobacco. He reports that he does not drink alcohol. No history on file for drug use.  Allergies  Allergen Reactions   Baclofen Other (See Comments)    Severe AMS, comatose, intubated   Bee Venom Anaphylaxis   Contrast Media [Iodinated Contrast Media] Anaphylaxis   Iodine-131 Anaphylaxis   Metrizamide Anaphylaxis   Metronidazole Anaphylaxis   Atorvastatin Other (See Comments)    Aching in legs   Lisinopril Cough    Medications  No current facility-administered medications for this encounter.  Current Outpatient Medications:    Acetaminophen (TYLENOL 8 HOUR PO), Take 1 tablet by mouth in the  morning, at noon, and at bedtime., Disp: , Rfl:    amLODipine (NORVASC) 5 MG tablet, Take 5 mg by mouth in the morning and at bedtime., Disp: , Rfl:    Calcium Carb-Cholecalciferol (CALCIUM + VITAMIN D3) 500-5 MG-MCG TABS, Take 1 tablet by mouth daily., Disp: , Rfl:    cephALEXin (KEFLEX) 500 MG capsule, Take 1 capsule (500 mg total) by mouth 2 (two) times daily. Take one in evening of 10/26, and one in morning of 10/27 to complete course for UTI. (Patient not taking: Reported on 09/12/2023), Disp: , Rfl:    clopidogrel (PLAVIX) 75 MG tablet, Take 1 tablet (75 mg total) by mouth daily., Disp: , Rfl:    EPINEPHrine 0.3 mg/0.3 mL IJ SOAJ injection, Inject 0.3 mg into the muscle once as needed (severe allergic reaction)., Disp: , Rfl:    escitalopram (LEXAPRO) 5 MG tablet, Take 10 mg by mouth daily., Disp: , Rfl:    ezetimibe (ZETIA) 10 MG tablet, Take 1 tablet (10 mg total) by mouth daily., Disp: , Rfl:    hydrochlorothiazide (HYDRODIURIL) 12.5 MG tablet, Take 12.5 mg by mouth daily., Disp: , Rfl:    insulin detemir (LEVEMIR) 100 UNIT/ML injection, Inject 0.05 mLs (5 Units total) into the skin at bedtime. (Patient taking differently: Inject 5 Units into the skin daily in the afternoon.), Disp: , Rfl:    memantine (NAMENDA) 10 MG tablet, Take 10 mg by mouth 2 (two) times daily., Disp: , Rfl:    Menthol, Topical Analgesic, (BIOFREEZE COOL THE PAIN) 4 % GEL, Apply 1 Application topically in the morning, at noon, and at bedtime., Disp: , Rfl:    Menthol, Topical Analgesic, (EUCERIN ITCH RELIEF) 0.1 % LOTN, Apply 1 Application topically in the morning and at bedtime. Apply to lower legs, Disp: , Rfl:    metoprolol succinate (TOPROL-XL) 50 MG 24 hr tablet, Take 100 mg by mouth daily., Disp: , Rfl:    OLANZapine (ZYPREXA) injection, Inject 0.5 mLs (2.5 mg total) into the muscle daily as needed for agitation. (Patient not taking: Reported on 09/12/2023), Disp: 1 each, Rfl: 3   Omega-3 1400 MG CAPS, Take 1,400  mg by mouth in the morning and at bedtime., Disp: , Rfl:    pantoprazole (PROTONIX) 20 MG tablet, Take 20 mg by mouth daily., Disp: , Rfl:    QUEtiapine (SEROQUEL) 25 MG tablet, Take 50 mg by mouth at bedtime., Disp: , Rfl:    sitaGLIPtin-metformin (JANUMET) 50-1000 MG tablet, Take 1 tablet by mouth daily., Disp: , Rfl:    sodium chloride 1 g tablet, Take 1 g by mouth daily., Disp: ,  Rfl:    tamsulosin (FLOMAX) 0.4 MG CAPS capsule, Take 1 capsule (0.4 mg total) by mouth daily. (Patient not taking: Reported on 09/12/2023), Disp: 30 capsule, Rfl:   Vitals  There were no vitals filed for this visit.  There is no height or weight on file to calculate BMI.  Physical Exam   Physical Exam  HEENT-  [place]/AT. No neck stiffness noted. Dry oral mucus membranes.     Lungs- Respirations unlabored Extremities- No edema.   Neurological Examination Mental Status: Decreased level of alertness. Initially staring straight ahead on arrival and making incomprehensible and severely dysarthric vocalizations, with the same monosyllabic moan in response to all orientation questions, including asking for his name. Only communication that was intelligible was balling up of his fist and gesticulating towards examiner as though threatening to punch during examiner's motor exam. After CT the vocalizations increased in volume as RNs obtained a rectal temp and he was able to vocalize "stop". As well as to again threaten examiner with a balled up right fist. He did protrude tongue slightly to command, but otherwise did not follow any motor commands. Did not name any common objects when asked. No apparent hemineglect. Cranial Nerves: II: Unreliable blink to threat bilaterally, but did make eye contact with examiner while balling up his right fist with a somewhat dysthymic facial expression. PERRL  III,IV, VI: No ptosis. Eyes are conjugate. He has hypometric saccades and did not track to command, but did glance to left and right  while agitated when RNs obtained a rectal temperature. No nystagmus.   V: Reacts to touch bilaterally  VII: Mouth moves slowly but symmetrically during vocalizations.  VIII: Alerts slowly to voice IX,X: Gag reflex deferred XI: Head is midline XII: Midline tongue extension Motor: BUE with intact antigravity movement and 4-5/5 resistance to examiners when attempting to restrain during rectal temperature, but does not follow motor commands. No asymmetry.  BLE 5/5 when resisting examiners as rectal temp is obtained. Did follow commands by RNs to elevate legs and did so for > 5 seconds.  Sensory: Reacts to noxious x 4.   Deep Tendon Reflexes: Not cooperative.  Cerebellar: No gross ataxia during limb movements, but unable to follow commands for formal testing.    Gait: Deferred   Labs/Imaging/Neurodiagnostic studies   CBC: No results for input(s): "WBC", "NEUTROABS", "HGB", "HCT", "MCV", "PLT" in the last 168 hours. Basic Metabolic Panel:  Lab Results  Component Value Date   NA 137 09/09/2023   K 3.9 09/09/2023   CO2 21 (L) 09/09/2023   GLUCOSE 177 (H) 09/09/2023   BUN 18 09/09/2023   CREATININE 1.59 (H) 09/09/2023   CALCIUM 8.9 09/09/2023   GFRNONAA 42 (L) 09/09/2023   GFRAA >60 11/04/2019   Lipid Panel:  Lab Results  Component Value Date   LDLCALC 88 09/07/2023   HgbA1c:  Lab Results  Component Value Date   HGBA1C 6.4 (H) 09/07/2023   Urine Drug Screen:     Component Value Date/Time   LABOPIA NONE DETECTED 05/11/2021 1610   COCAINSCRNUR NONE DETECTED 05/11/2021 1610   LABBENZ NONE DETECTED 05/11/2021 1610   AMPHETMU NONE DETECTED 05/11/2021 1610   THCU NONE DETECTED 05/11/2021 1610   LABBARB NONE DETECTED 05/11/2021 1610    Alcohol Level     Component Value Date/Time   ETH <10 09/06/2023 1040   INR  Lab Results  Component Value Date   INR 1.3 (H) 02/01/2023   APTT  Lab Results  Component Value Date  APTT 40 (H) 02/01/2023   AED levels: No results found  for: "PHENYTOIN", "ZONISAMIDE", "LAMOTRIGINE", "LEVETIRACETA"  CT Head without contrast(Personally reviewed): No acute cortically based infarct or acute intracranial hemorrhage identified. ASPECTS 10. Chronic cerebral small vessel disease and atrophy are prominent findings.  TTE (09/07/23): 1. Left ventricular ejection fraction, by estimation, is 50 to 55%. The  left ventricle has low normal function. The left ventricle has no regional  wall motion abnormalities. Left ventricular diastolic parameters are  indeterminate.   2. Right ventricular systolic function is normal. The right ventricular  size is normal.   3. The mitral valve is normal in structure. No evidence of mitral valve  regurgitation. No evidence of mitral stenosis.   4. The aortic valve is calcified. There is moderate thickening of the  aortic valve. Aortic valve regurgitation is not visualized. Aortic valve  sclerosis/calcification is present, without any evidence of aortic  stenosis.   5. The inferior vena cava is normal in size with greater than 50%  respiratory variability, suggesting right atrial pressure of 3 mmHg.   ASSESSMENT  88 y.o. male with a past medical history of A-fib, Anemia, Angina pectoris (HCC), Arthritis, Carpal tunnel syndrome, Cobalamin deficiency, Coronary artery disease, Diabetes mellitus without complication (HCC), Eczema, Hyperlipemia, Hypertension, Kidney disease, chronic, stage III (moderate, EGFR 30-59 ml/min) (HCC), Lung infiltrate, Myocardial infarct, old, Prostatic hypertrophy, Sleep apnea, obstructive, and Squamous cell cancer of lip.,and dementia, who presents to the ED via EMS from his SNF with acute left sided weakness and aphasia. LKN 11 PM at regular check by SNF staff. This AM during a routine check, he was noted to be aphasic with left sided weakness. Per son, due to his dementia he can carry on simple conversations but has significant memory deficits. He is wheelchair-bound per son, only  able to stand with assistance to use the bathroom. He is completely dependent on others for his care. He was formerly on Eliquis for his a-fib, but this was stopped. He has a history of coronary artery stent placement and is on Plavix.  - Exam reveals an agitated and encephalopathic elderly male with receptive and expressive aphasia in conjunction with dysarthria in the context of dry oral mucous membranes.  - CT head negative for acute abnormality.  - Overall presentation is most consistent with acute toxic/metabolic or infectious encephalopathy. No neck stiffness to suggest a meningitis. New onset seizure activity with postictal confusion is a consideration but is relatively los on the DDx    RECOMMENDATIONS  - EEG - Toxic/metabolic/infectious work up - IV hydration - Continue his memantine and consider addition of Aricept - B12 supplementation given his history of cobalamin deficiency - Glycemic control - Avoid deliriogenic medications including antipsychotics, benzodiazepines and benadryl - Neurology will sign off if EEG shows no findings concerning for seizures  ______________________________________________________________________    Dessa Phi, Jorgeluis Gurganus, MD [facility]

## 2023-10-29 NOTE — Progress Notes (Addendum)
  Subjective: Our team was called by nursing to assess Mr. Cuffee given increased difficulty speaking. Patient's daughter reports that she thinks his speech is worse than in the ED this AM. She states she is unable to understand what he is saying and reports his left arm is not moving as intentional or as high as this AM. She asks about getting an MRI. Team discussed with patient's daughter about being outside the treatment window if he had an ischemic stroke last night; however, if patient has new deficits, an MRI may provide findings regarding a new stroke. Team discussed possibility of delirium with patient's daughter especially with history of delirium in the past. Nursing reports patient became agitated when his daughter left the room this afternoon.   Additionally, patient was provided water to drink. He was able to enclose his laps around the straw and swallow, but coordination did not appear completely intact.  Bonita Quin, RN at bedside during conversation.   Objective:  Vital signs in last 24 hours: Vitals:   10/29/23 0730 10/29/23 0800 10/29/23 0806 10/29/23 0843  BP:  (!) 145/62  (!) 158/60  Pulse: 64 63  64  Resp: 16 17  18   Temp:   97.6 F (36.4 C) (!) 97.5 F (36.4 C)  TempSrc:   Temporal Oral  SpO2: 97% 98%  97%  Weight:       Weight change:  No intake or output data in the 24 hours ending 10/29/23 1404  Physical Exam General: NAD, more alert than this AM Neurological: Slurred speech with increased difficulty understanding patient than this morning; L facial drooping; oriented to person and place; able to follow simple commands; patient able to move all four extremities   Assessment/Plan:  Principal Problem:   Acute encephalopathy Given low threshold for MRI with increased difficulty speaking, MRI and SLP evaluation ordered. Will re-engage Neurology depending on MRI results. Given UA results, history of UTI and inability to reliably determine if patient has associated pain  with urination, will start broad IV antibiotics and give fluids.  Plan: - MRI brain - IV ceftriaxone - SLP evaluation, NPO  - IV 1 L LR bolus - Consider resuming Zyprexa if delirious symptoms arise   LOS: 0 days   Bubba Hales, Medical Student 10/29/2023, 2:04 PM   I personally was present and re-performed the history, physical exam and medical decision-making activities of this service and have verified that the service and findings are accurately documented in the student's note.   Aquarius Latouche Colbert Coyer, MD PGY-Internal Medicine Teaching Service Pager: [phone] After 5pm on weekdays and 1pm on weekends: On Call pager [phone]

## 2023-10-29 NOTE — Procedures (Signed)
Patient Name: Duane [name]  MRN: [id number]  Epilepsy Attending: Charlsie Quest  Referring Physician/Provider: Caryl Pina, MD  Date: 10/29/2023 Duration: 27.40 mins  Patient history: 88 yo M with left sided weakness and aphasia. EEG to evaluate for seizure   Level of alertness: Awake  AEDs during EEG study: None  Technical aspects: This EEG study was done with scalp electrodes positioned according to the 10-20 International system of electrode placement. Electrical activity was reviewed with band pass filter of 1-70Hz , sensitivity of 7 uV/mm, display speed of 56mm/sec with a 60Hz  notched filter applied as appropriate. EEG data were recorded continuously and digitally stored.  Video monitoring was available and reviewed as appropriate.  Description: The posterior dominant rhythm consists of 8 Hz activity of moderate voltage (25-35 uV) seen predominantly in posterior head regions, symmetric and reactive to eye opening and eye closing. EEG showed intermittent generalized 5 to 7Hz  theta slowing. Hyperventilation and photic stimulation were not performed.     ABNORMALITY - Intermittent slow, generalized  IMPRESSION: This study is suggestive of mild diffuse encephalopathy. No seizures or epileptiform discharges were seen throughout the recording.  [name] Annabelle Harman

## 2023-10-29 NOTE — Hospital Course (Signed)
Speaking issues, left side body movement
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
## 2023-10-29 NOTE — Code Documentation (Signed)
Stroke Response Nurse Documentation Code Documentation  INFANT SCHULZE is a 88 y.o. male arriving to [facility]  via [facility] on 12/14 with past medical hx of Htn, paroxysmal afib, DM, CKD, CVA, Hld, vascular dementia. On No antithrombotic. (Has previously taken Eliquis but staff at [facility] report they are no longer giving it to him. Code stroke was activated by EMS.   Patient from [facility] where he was LKW at 2300 on 12/13 and staff noticed at 0300 that he was weak on the L side with more confusion than normal.    Stroke team at the bedside on patient arrival. Labs drawn and patient cleared for CT by Dr. Madilyn Hook. Patient to CT with team. NIHSS 6, see documentation for details and code stroke times. Patient with disoriented, not following commands, left facial droop, Global aphasia , and dysarthria  on exam. The following imaging was completed:  CT Head. Patient is not a candidate for IV Thrombolytic due to being out of the treatment window. Patient is not not a candidate for IR due to no LVO suspected per MD.   Care Plan: q2 NIHSS and vitals.   Bedside handoff with ED RN Tresa Endo.    Pearlie Oyster  Stroke Response RN

## 2023-10-29 NOTE — Progress Notes (Signed)
EEG completed, results pending. 

## 2023-10-29 NOTE — ED Triage Notes (Signed)
Pt arrives via [place] from [facility]. Per ems report, the pt was at his baseline with the 2300 rounding. At 0300 pt had garbled speech and left sided weakness. Pt may be on eliquis/plavix, staff was unsure at time of report to EMS. Stroke MD met pt on arrival.

## 2023-10-29 NOTE — ED Notes (Signed)
ED TO INPATIENT HANDOFF REPORT  ED Nurse Name and Phone #: Percival Spanish [phone]  S Name/Age/Gender Duane [name] 88 y.o. male Room/Bed: TRACC/TRACC  Code Status   Code Status: Full Code  Home/SNF/Other Skilled nursing facility Patient oriented to: self and place Is this baseline? Yes   Triage Complete: Triage complete  Chief Complaint Acute encephalopathy [G93.40]  Triage Note Pt arrives via [facility] from [facility]. Per ems report, the pt was at his baseline with the 2300 rounding. At 0300 pt had garbled speech and left sided weakness. Pt may be on eliquis/plavix, staff was unsure at time of report to EMS. Stroke MD met pt on arrival.    Allergies Allergies  Allergen Reactions   Baclofen Other (See Comments)    Severe AMS, comatose, intubated   Bee Venom Anaphylaxis   Contrast Media [Iodinated Contrast Media] Anaphylaxis   Iodine-131 Anaphylaxis   Metrizamide Anaphylaxis   Metronidazole Anaphylaxis   Atorvastatin Other (See Comments)    Aching in legs   Lisinopril Cough    Level of Care/Admitting Diagnosis ED Disposition     ED Disposition  Admit   Condition  --   Comment  Hospital Area: [facility] [[id number]]  Level of Care: Telemetry Medical [104]  May place patient in observation at [facility] or [facility] if equivalent level of care is available:: Yes  Covid Evaluation: Asymptomatic - no recent exposure (last 10 days) testing not required  Diagnosis: Acute encephalopathy [454098]  Admitting Physician: Dickie La [[id number]]  Attending Physician: Dickie La [[id number]]          B Medical/Surgery History Past Medical History:  Diagnosis Date   A-fib (HCC)    Anemia    Angina pectoris (HCC)    Arthritis    Carpal tunnel syndrome    Cobalamin deficiency    Coronary artery disease    Diabetes mellitus without complication (HCC)    Eczema    Hyperlipemia    Hypertension    Kidney disease, chronic, stage III (moderate, EGFR  30-59 ml/min) (HCC)    Lung infiltrate    Myocardial infarct, old    Prostatic hypertrophy    Sleep apnea, obstructive    Squamous cell cancer of lip    Past Surgical History:  Procedure Laterality Date   CORONARY ANGIOPLASTY WITH STENT PLACEMENT     POSTERIOR CERVICAL FUSION/FORAMINOTOMY N/A 02/01/2023   Procedure: CERVICAL FIVE - THORACIC ONE LAMINECTOMY, AND INSTRUMENTED FUSION;  Surgeon: Jadene Pierini, MD;  Location: [facility] OR;  Service: Neurosurgery;  Laterality: N/A;     A IV Location/Drains/Wounds Patient Lines/Drains/Airways Status     Active Line/Drains/Airways     Name Placement date Placement time Site Days   Peripheral IV 10/29/23 20 G Right;Posterior Hand 10/29/23  0648  Hand  less than 1   External Urinary Catheter 10/29/23  0865  --  less than 1            Intake/Output Last 24 hours No intake or output data in the 24 hours ending 10/29/23 0759  Labs/Imaging Results for orders placed or performed during the hospital encounter of 10/29/23 (from the past 48 hours)  CBG monitoring, ED     Status: Abnormal   Collection Time: 10/29/23  6:07 AM  Result Value Ref Range   Glucose-Capillary 111 (H) 70 - 99 mg/dL    Comment: Glucose reference range applies only to samples taken after fasting for at least 8 hours.   Comment 1 Notify  RN    Comment 2 Document in Chart   I-stat chem 8, ED     Status: Abnormal   Collection Time: 10/29/23  6:16 AM  Result Value Ref Range   Sodium 139 135 - 145 mmol/L   Potassium 4.0 3.5 - 5.1 mmol/L   Chloride 106 98 - 111 mmol/L   BUN 23 8 - 23 mg/dL   Creatinine, Ser 6.64 (H) 0.61 - 1.24 mg/dL   Glucose, Bld 403 (H) 70 - 99 mg/dL    Comment: Glucose reference range applies only to samples taken after fasting for at least 8 hours.   Calcium, Ion 1.09 (L) 1.15 - 1.40 mmol/L   TCO2 21 (L) 22 - 32 mmol/L   Hemoglobin 11.6 (L) 13.0 - 17.0 g/dL   HCT 47.4 (L) 25.9 - 56.3 %  I-Stat CG4 Lactic Acid, ED     Status: None   Collection  Time: 10/29/23  6:16 AM  Result Value Ref Range   Lactic Acid, Venous 0.8 0.5 - 1.9 mmol/L  Ethanol     Status: None   Collection Time: 10/29/23  6:19 AM  Result Value Ref Range   Alcohol, Ethyl (B) <10 <10 mg/dL    Comment: (NOTE) Lowest detectable limit for serum alcohol is 10 mg/dL.  For medical purposes only. Performed at [facility], [address], [place], [place] [address]   Protime-INR     Status: None   Collection Time: 10/29/23  6:19 AM  Result Value Ref Range   Prothrombin Time 13.5 11.4 - 15.2 seconds   INR 1.0 0.8 - 1.2    Comment: (NOTE) INR goal varies based on device and disease states. Performed at [facility], [address] [place], [place] [address]   APTT     Status: None   Collection Time: 10/29/23  6:19 AM  Result Value Ref Range   aPTT 28 24 - 36 seconds    Comment: Performed at [facility], [address] [place], [place] [address]  CBC     Status: Abnormal   Collection Time: 10/29/23  6:19 AM  Result Value Ref Range   WBC 7.8 4.0 - 10.5 K/uL   RBC 4.11 (L) 4.22 - 5.81 MIL/uL   Hemoglobin 11.0 (L) 13.0 - 17.0 g/dL   HCT 66.0 (L) 63.0 - 16.0 %   MCV 83.5 80.0 - 100.0 fL   MCH 26.8 26.0 - 34.0 pg   MCHC 32.1 30.0 - 36.0 g/dL   RDW 10.9 32.3 - 55.7 %   Platelets 208 150 - 400 K/uL   nRBC 0.0 0.0 - 0.2 %    Comment: Performed at [facility], [address] [place], [place] [address]  Differential     Status: None   Collection Time: 10/29/23  6:19 AM  Result Value Ref Range   Neutrophils Relative % 54 %   Neutro Abs 4.2 1.7 - 7.7 K/uL   Lymphocytes Relative 32 %   Lymphs Abs 2.5 0.7 - 4.0 K/uL   Monocytes Relative 9 %   Monocytes Absolute 0.7 0.1 - 1.0 K/uL   Eosinophils Relative 4 %   Eosinophils Absolute 0.3 0.0 - 0.5 K/uL   Basophils Relative 1 %   Basophils Absolute 0.1 0.0 - 0.1 K/uL   Immature Granulocytes 0 %   Abs Immature Granulocytes 0.03 0.00 - 0.07 K/uL    Comment: Performed at [facility], [address]  [place], [place] [address]   DG Chest Port 1 View Result Date: 10/29/2023 CLINICAL DATA:  88 year old male with history of altered mental status. EXAM: PORTABLE CHEST 1 VIEW COMPARISON:  Chest x-ray 09/12/2023. FINDINGS: Lung volumes are very low. Diffuse interstitial prominence widespread peribronchial cuffing along with some patchy ill-defined opacities are noted in the lungs bilaterally, most severe throughout the left mid to lower lung, concerning for probable bronchitis and developing multilobar bronchopneumonia. No pleural effusions. No pneumothorax. No evidence of pulmonary edema. Heart size is borderline enlarged. Upper mediastinal contours are within normal limits. Atherosclerotic calcifications are noted in the thoracic aorta. IMPRESSION: 1. The appearance of the chest is concerning for bronchitis and developing bronchopneumonia, as above. 2. Aortic atherosclerosis. Electronically Signed   By: Trudie Reed M.D.   On: 10/29/2023 07:03   CT HEAD CODE STROKE WO CONTRAST Result Date: 10/29/2023 CLINICAL DATA:  Code stroke. 88 year old male with left side weakness and aphasia. EXAM: CT HEAD WITHOUT CONTRAST TECHNIQUE: Contiguous axial images were obtained from the base of the skull through the vertex without intravenous contrast. RADIATION DOSE REDUCTION: This exam was performed according to the departmental dose-optimization program which includes automated exposure control, adjustment of the mA and/or kV according to patient size and/or use of iterative reconstruction technique. COMPARISON:  Head CT 09/12/2023.  Brain MRI 09/06/2023. FINDINGS: Brain: Patchy left corona radiata white matter infarcts in October with small areas of cystic encephalomalacia there now (series 3, image 16). Underlying chronic and confluent periventricular white matter hypodensity otherwise. Ventral thalamic heterogeneity, possible increased perivascular spaces appears stable from the October MRI.  No midline shift, ventriculomegaly, mass effect, evidence of mass lesion, intracranial hemorrhage or evidence of cortically based acute infarction. Vascular: Calcified atherosclerosis at the skull base. No suspicious intracranial vascular hyperdensity. Skull: No acute osseous abnormality identified. Sinuses/Orbits: Visualized paranasal sinuses and mastoids are stable and well aerated. Other: No gaze deviation, acute orbit or scalp soft tissue finding. ASPECTS [facility] Stroke Program Early CT Score) Total score (0-10 with 10 being normal): 10 IMPRESSION: 1. No acute cortically based infarct or acute intracranial hemorrhage identified. ASPECTS 10. Chronic cerebral small vessel disease. 2. These results were communicated to Dr. Otelia Limes at 6:21 am on 10/29/2023 by text page via the [facility] messaging system. Electronically Signed   By: Odessa Fleming M.D.   On: 10/29/2023 06:22    Pending Labs Unresulted Labs (From admission, onward)     Start     Ordered   10/29/23 0748  SARS Coronavirus 2 by RT PCR (hospital order, performed in [facility] hospital lab) *cepheid single result test* Anterior Nasal Swab  (Tier 2 - SARS Coronavirus 2 by RT PCR (hospital order, performed in [facility] hospital lab) *cepheid single result test*)  Once,   R        10/29/23 0749   10/29/23 0748  Respiratory (~20 pathogens) panel by PCR  (Respiratory panel by PCR (~20 pathogens, ~24 hr TAT)  w precautions)  Once,   R        10/29/23 0749   10/29/23 0710  Comprehensive metabolic panel  Once,   R        10/29/23 0710   10/29/23 0608  Urinalysis, Routine w reflex microscopic -Urine, Clean Catch  Once,   URGENT       Question:  Specimen Source  Answer:  Urine, Clean Catch   10/29/23 0608            Vitals/Pain Today's Vitals   10/29/23 0600  10/29/23 0633 10/29/23 0715 10/29/23 0730  BP:  (!) 166/59 (!) 150/56   Pulse:  63 65 64  Resp:  15 16 16   Temp:  (!) 96.9 F (36.1 C)    SpO2:  99% 99% 97%  Weight: 93.6 kg        Isolation Precautions Airborne and Contact precautions  Medications Medications  enoxaparin (LOVENOX) injection 30 mg (has no administration in time range)    Mobility power wheelchair     Focused Assessments Neuro Assessment Handoff:  Swallow screen pass? No  Cardiac Rhythm: Normal sinus rhythm NIH Stroke Scale  Dizziness Present: No Headache Present: No Interval: Initial Level of Consciousness (1a.)   : Alert, keenly responsive LOC Questions (1b. )   : Answers neither question correctly LOC Commands (1c. )   : Performs one task correctly Best Gaze (2. )  : Normal Visual (3. )  : No visual loss Facial Palsy (4. )    : Minor paralysis Motor Arm, Left (5a. )   : No drift Motor Arm, Right (5b. ) : No drift Motor Leg, Left (6a. )  : No drift Motor Leg, Right (6b. ) : No drift Limb Ataxia (7. ): Absent Sensory (8. )  : Normal, no sensory loss Best Language (9. )  : Mild-to-moderate aphasia Dysarthria (10. ): Mild-to-moderate dysarthria, patient slurs at least some words and, at worst, can be understood with some difficulty Extinction/Inattention (11.)   : No Abnormality Complete NIHSS TOTAL: 6 Last date known well: 10/28/23 Last time known well: 2300 Neuro Assessment: Exceptions to WDL Neuro Checks:   Initial (10/29/23 0630)  Has TPA been given? No If patient is a Neuro Trauma and patient is going to OR before floor call report to 4N Charge nurse: [phone] or [phone]   R Recommendations: See Admitting Provider Note  Report given to:   Additional Notes:

## 2023-10-30 DIAGNOSIS — I639 Cerebral infarction, unspecified: Secondary | ICD-10-CM | POA: Diagnosis not present

## 2023-10-30 DIAGNOSIS — N390 Urinary tract infection, site not specified: Secondary | ICD-10-CM | POA: Diagnosis present

## 2023-10-30 DIAGNOSIS — J180 Bronchopneumonia, unspecified organism: Secondary | ICD-10-CM | POA: Diagnosis present

## 2023-10-30 DIAGNOSIS — D631 Anemia in chronic kidney disease: Secondary | ICD-10-CM | POA: Diagnosis present

## 2023-10-30 DIAGNOSIS — F0390 Unspecified dementia without behavioral disturbance: Secondary | ICD-10-CM | POA: Diagnosis present

## 2023-10-30 DIAGNOSIS — G9340 Encephalopathy, unspecified: Secondary | ICD-10-CM | POA: Diagnosis not present

## 2023-10-30 DIAGNOSIS — R1310 Dysphagia, unspecified: Secondary | ICD-10-CM | POA: Diagnosis present

## 2023-10-30 DIAGNOSIS — E860 Dehydration: Secondary | ICD-10-CM | POA: Diagnosis present

## 2023-10-30 DIAGNOSIS — R4701 Aphasia: Secondary | ICD-10-CM | POA: Diagnosis present

## 2023-10-30 DIAGNOSIS — I480 Paroxysmal atrial fibrillation: Secondary | ICD-10-CM | POA: Diagnosis present

## 2023-10-30 DIAGNOSIS — R29715 NIHSS score 15: Secondary | ICD-10-CM | POA: Diagnosis not present

## 2023-10-30 DIAGNOSIS — E1122 Type 2 diabetes mellitus with diabetic chronic kidney disease: Secondary | ICD-10-CM | POA: Diagnosis present

## 2023-10-30 DIAGNOSIS — Z66 Do not resuscitate: Secondary | ICD-10-CM | POA: Diagnosis not present

## 2023-10-30 DIAGNOSIS — N400 Enlarged prostate without lower urinary tract symptoms: Secondary | ICD-10-CM | POA: Diagnosis present

## 2023-10-30 DIAGNOSIS — I129 Hypertensive chronic kidney disease with stage 1 through stage 4 chronic kidney disease, or unspecified chronic kidney disease: Secondary | ICD-10-CM | POA: Diagnosis present

## 2023-10-30 DIAGNOSIS — Z515 Encounter for palliative care: Secondary | ICD-10-CM | POA: Diagnosis not present

## 2023-10-30 DIAGNOSIS — N1831 Chronic kidney disease, stage 3a: Secondary | ICD-10-CM | POA: Diagnosis present

## 2023-10-30 DIAGNOSIS — Z794 Long term (current) use of insulin: Secondary | ICD-10-CM | POA: Diagnosis not present

## 2023-10-30 DIAGNOSIS — R29706 NIHSS score 6: Secondary | ICD-10-CM | POA: Diagnosis present

## 2023-10-30 DIAGNOSIS — G8194 Hemiplegia, unspecified affecting left nondominant side: Secondary | ICD-10-CM | POA: Diagnosis present

## 2023-10-30 DIAGNOSIS — I63431 Cerebral infarction due to embolism of right posterior cerebral artery: Secondary | ICD-10-CM | POA: Diagnosis present

## 2023-10-30 DIAGNOSIS — R29717 NIHSS score 17: Secondary | ICD-10-CM | POA: Diagnosis not present

## 2023-10-30 DIAGNOSIS — G9349 Other encephalopathy: Secondary | ICD-10-CM | POA: Diagnosis present

## 2023-10-30 DIAGNOSIS — R414 Neurologic neglect syndrome: Secondary | ICD-10-CM | POA: Diagnosis present

## 2023-10-30 DIAGNOSIS — R29712 NIHSS score 12: Secondary | ICD-10-CM | POA: Diagnosis not present

## 2023-10-30 DIAGNOSIS — E785 Hyperlipidemia, unspecified: Secondary | ICD-10-CM | POA: Diagnosis present

## 2023-10-30 LAB — CBC
HCT: 32.5 % — ABNORMAL LOW (ref 39.0–52.0)
Hemoglobin: 10.3 g/dL — ABNORMAL LOW (ref 13.0–17.0)
MCH: 26.5 pg (ref 26.0–34.0)
MCHC: 31.7 g/dL (ref 30.0–36.0)
MCV: 83.5 fL (ref 80.0–100.0)
Platelets: 207 10*3/uL (ref 150–400)
RBC: 3.89 MIL/uL — ABNORMAL LOW (ref 4.22–5.81)
RDW: 15.2 % (ref 11.5–15.5)
WBC: 7.9 10*3/uL (ref 4.0–10.5)
nRBC: 0 % (ref 0.0–0.2)

## 2023-10-30 LAB — BASIC METABOLIC PANEL
Anion gap: 11 (ref 5–15)
Anion gap: 14 (ref 5–15)
BUN: 18 mg/dL (ref 8–23)
BUN: 18 mg/dL (ref 8–23)
CO2: 22 mmol/L (ref 22–32)
CO2: 19 mmol/L — ABNORMAL LOW (ref 22–32)
Calcium: 9.3 mg/dL (ref 8.9–10.3)
Calcium: 8.9 mg/dL (ref 8.9–10.3)
Chloride: 104 mmol/L (ref 98–111)
Chloride: 102 mmol/L (ref 98–111)
Creatinine, Ser: 2 mg/dL — ABNORMAL HIGH (ref 0.61–1.24)
Creatinine, Ser: 1.56 mg/dL — ABNORMAL HIGH (ref 0.61–1.24)
GFR, Estimated: 32 mL/min — ABNORMAL LOW (ref >60)
GFR, Estimated: 43 mL/min — ABNORMAL LOW (ref >60)
Glucose, Bld: 129 mg/dL — ABNORMAL HIGH (ref 70–99)
Glucose, Bld: 134 mg/dL — ABNORMAL HIGH (ref 70–99)
Potassium: 3.8 mmol/L (ref 3.5–5.1)
Potassium: 3.7 mmol/L (ref 3.5–5.1)
Sodium: 137 mmol/L (ref 135–145)
Sodium: 135 mmol/L (ref 135–145)

## 2023-10-30 LAB — GLUCOSE, CAPILLARY: Glucose-Capillary: 132 mg/dL — ABNORMAL HIGH (ref 70–99)

## 2023-10-30 LAB — VITAMIN B12: Vitamin B-12: 131 pg/mL — ABNORMAL LOW (ref 180–914)

## 2023-10-30 MED ORDER — ASPIRIN 300 MG RE SUPP: 300.0000 mg | Freq: Once | RECTAL | Status: DC

## 2023-10-30 MED ORDER — LACTATED RINGERS IV BOLUS
1000.0000 mL | Freq: Once | INTRAVENOUS | Status: AC
  Administered 2023-10-30: 1000 mL via INTRAVENOUS

## 2023-10-30 MED ORDER — ENOXAPARIN SODIUM 100 MG/ML IJ SOSY
1.0000 mg/kg | PREFILLED_SYRINGE | Freq: Two times a day (BID) | INTRAMUSCULAR | Status: DC
  Administered 2023-10-30 – 2023-11-01 (×5): 92.5 mg via SUBCUTANEOUS
  Filled 2023-10-30 (×6): qty 0.93

## 2023-10-30 NOTE — Plan of Care (Signed)

## 2023-10-30 NOTE — Progress Notes (Addendum)
STROKE TEAM PROGRESS NOTE   BRIEF HPI Duane [name] is a 88 y.o. male with history of A-fib (HCC), Anemia, Angina pectoris (HCC), Arthritis, Carpal tunnel syndrome, Cobalamin deficiency, Coronary artery disease, Diabetes mellitus without complication (HCC), Eczema, Hyperlipemia, Hypertension, Kidney disease, chronic, stage III (moderate, EGFR 30-59 ml/min) (HCC), Lung infiltrate, Myocardial infarct, old, Prostatic hypertrophy, Sleep apnea, obstructive, and Squamous cell cancer of lip.,and dementia, who presents to the ED via EMS from his SNF with acute left sided weakness and aphasia.  LKN 11 PM at regular check by SNF staff. This AM during a routine check, he was noted to be aphasic with left sided weakness. Per son, due to his dementia he can carry on simple conversations but has significant memory deficits. He is wheelchair-bound per son, only able to stand with assistance to use the bathroom. He is completely dependent on others for his care. He was formerly on Eliquis for his a-fib, but this was stopped due to falls. He has a history of coronary artery stent placement and is on Plavix.   NIH on Admission 10  SIGNIFICANT HOSPITAL EVENTS   INTERIM HISTORY/SUBJECTIVE Will start lovenox for anticoagulation today, speech is unintelligible. Is able to follow commands on the right hand and bilateral legs. Daughter is at the bedside.   OBJECTIVE  CBC    Component Value Date/Time   WBC 7.9 10/30/2023 0707   RBC 3.89 (L) 10/30/2023 0707   HGB 10.3 (L) 10/30/2023 0707   HCT 32.5 (L) 10/30/2023 0707   PLT 207 10/30/2023 0707   MCV 83.5 10/30/2023 0707   MCH 26.5 10/30/2023 0707   MCHC 31.7 10/30/2023 0707   RDW 15.2 10/30/2023 0707   LYMPHSABS 2.5 10/29/2023 0619   MONOABS 0.7 10/29/2023 0619   EOSABS 0.3 10/29/2023 0619   BASOSABS 0.1 10/29/2023 0619    BMET    Component Value Date/Time   NA 137 10/30/2023 0707   K 3.8 10/30/2023 0707   CL 104 10/30/2023 0707   CO2 22  10/30/2023 0707   GLUCOSE 129 (H) 10/30/2023 0707   BUN 18 10/30/2023 0707   CREATININE 2.00 (H) 10/30/2023 0707   CALCIUM 9.3 10/30/2023 0707   GFRNONAA 32 (L) 10/30/2023 0707    IMAGING past 24 hours MR BRAIN WO CONTRAST Result Date: 10/29/2023 CLINICAL DATA:  Mental status change. Unknown cause. Code stroke with new onset left-sided weakness and aphasia. EXAM: MRI HEAD WITHOUT CONTRAST TECHNIQUE: Multiplanar, multiecho pulse sequences of the brain and surrounding structures were obtained without intravenous contrast. COMPARISON:  CT head without contrast 10/29/2023 MR head without contrast 09/06/2023. FINDINGS: Brain: Multifocal areas of restricted diffusion are present. Previously seen diffusion signal abnormalities in the left corona radiata are slightly less distinct than on the prior exam. New restricted diffusion is present within the posterior limb of the right internal capsule, likely responsible for the left symptoms cyst focal area of restricted diffusion is present in the medial right occipital lobe. A focal acute nonhemorrhagic infarct is present in the right hippocampus. Moderate generalized atrophy and confluent white matter disease is otherwise stable bilaterally. The ventricles are proportionate to the degree of atrophy. No significant extraaxial fluid collection is present. White matter changes extend into the brainstem. Remote lacunar infarct is again noted in the right cerebellar peduncle. Acute abnormality is present in the posterior fossa. The internal auditory canals are within normal limits. Midline structures are within normal limits. Vascular: Flow is present in the major intracranial arteries. Skull and upper cervical  spine: Craniocervical junction is normal. Flow is present in the vertebral arteries bilaterally. Visualized intracranial contents are normal. Sinuses/Orbits: The paranasal sinuses and mastoid air cells are clear. IMPRESSION: 1. Multifocal areas of restricted  diffusion are present bilaterally. 2. New restricted diffusion is present within the posterior limb of the right internal capsule, likely responsible for the left symptoms. 3. Focal acute nonhemorrhagic infarct in the right hippocampus. 4. Focal area of restricted diffusion in the medial right occipital lobe. 5. Previously seen diffusion signal abnormalities in the left corona radiata are slightly less distinct than on the prior exam. 6. Stable moderate generalized atrophy and confluent white matter disease. This likely reflects the sequela of chronic microvascular ischemia. Electronically Signed   By: Marin Roberts M.D.   On: 10/29/2023 18:10   EEG adult Result Date: 10/29/2023 Charlsie Quest, MD     10/29/2023  1:58 PM Patient Name: Duane [name] MRN: [id number] Epilepsy Attending: Charlsie Quest Referring Physician/Provider: Caryl Pina, MD Date: 10/29/2023 Duration: 27.40 mins Patient history: 88 yo M with left sided weakness and aphasia. EEG to evaluate for seizure Level of alertness: Awake AEDs during EEG study: None Technical aspects: This EEG study was done with scalp electrodes positioned according to the 10-20 International system of electrode placement. Electrical activity was reviewed with band pass filter of 1-70Hz , sensitivity of 7 uV/mm, display speed of 54mm/sec with a 60Hz  notched filter applied as appropriate. EEG data were recorded continuously and digitally stored.  Video monitoring was available and reviewed as appropriate. Description: The posterior dominant rhythm consists of 8 Hz activity of moderate voltage (25-35 uV) seen predominantly in posterior head regions, symmetric and reactive to eye opening and eye closing. EEG showed intermittent generalized 5 to 7Hz  theta slowing. Hyperventilation and photic stimulation were not performed.   ABNORMALITY - Intermittent slow, generalized IMPRESSION: This study is suggestive of mild diffuse encephalopathy. No seizures or  epileptiform discharges were seen throughout the recording. Charlsie Quest    Vitals:   10/30/23 0025 10/30/23 0402 10/30/23 0820 10/30/23 1243  BP: 130/75 (!) 141/77 (!) 131/56 (!) 141/87  Pulse: 82 (!) 109 86 97  Resp: 18 18 18 18   Temp: 99 F (37.2 C) 98.7 F (37.1 C) 99.4 F (37.4 C) 98.3 F (36.8 C)  TempSrc: Oral Oral Oral Oral  SpO2: 97% 97% 99% 98%  Weight:         PHYSICAL EXAM General:  Eyes open, chronically ill appear elderly  Psych:  Mood and affect appropriate for situation CV: Regular rate and rhythm on monitor Respiratory:  Regular, unlabored respirations on room air GI: Abdomen soft and nontender   NEURO:  Mental Status:Decreased level of alertness. Initially staring straight ahead on arrival and making incomprehensible and severely dysarthric vocalization Cranial Nerves: II: Unreliable blink to threat bilaterally, but did make eye contact with examiner while balling up his right fist with a somewhat dysthymic facial expression. PERRL  III,IV, VI: No ptosis. Eyes are conjugate. He has hypometric saccades and did not track to command, but did glance to left and right while agitated when RNs obtained a rectal temperature. No nystagmus.   V: Reacts to touch bilaterally  VII: Mouth moves slowly but symmetrically during vocalizations.  VIII: Alerts slowly to voice IX,X: Gag reflex deferred XI: Head is midline XII: Midline tongue extension Motor: RUE 4/5 LUE 0/5 - but increased resistance to passive movement BLE 5/5 when resisting examiners as rectal temp is obtained. Did follow commands by RNs  to elevate legs and did so for > 5 seconds.  Sensory: Reacts to noxious x 4.   Cerebellar: No gross ataxia during limb movements, but unable to follow commands for formal testing.    Gait: Deferred  ASSESSMENT/PLAN  Acute Ischemic Infarct: Acute  infarct in right internal capsule as well as multifocal infarcts bilaterally  Code Stroke CT head - No acute cortically  based infarct or acute intracranial hemorrhage identified. ASPECTS 10. Chronic cerebral small vessel disease. MRI  Multifocal areas of restricted diffusion are present bilaterally. New restricted diffusion is present within the posterior limb of the right internal capsule, likely responsible for the left symptoms. Focal acute nonhemorrhagic infarct in the right hippocampus. Focal area of restricted diffusion in the medial right occipital lobe. Previously seen diffusion signal abnormalities in the left corona radiata are slightly less distinct than on the prior exam. Stable moderate generalized atrophy and confluent white matter disease. This likely reflects the sequela of chronic microvascular ischemia. MRA  cancelled. Had recent MRA in October.  Carotid US 2D Echo EF 50-55% LDL 88 HgbA1c 6.4 VTE prophylaxis - Lovenox No antithrombotic prior to admission, now on  Lovenox  and plavix  Therapy recommendations:  Pending Disposition:  Pending   Atrial fibrillation Home Meds: none Continue telemetry monitoring Begin anticoagulation with Lovenox   Hypertension Home meds:  metoprolol, hydrochlorothiazide Stable Blood Pressure Goal: BP less than 220/110   Hyperlipidemia LDL 88, goal < 70 Continue statin at discharge  DM2 Ha1C- 6.4 Insulin  CBGs and SSI ordered  Dysphagia Patient has post-stroke dysphagia, SLP consulted    Diet   Diet NPO time specified   Advance diet as tolerated  Other Stroke Risk Factors Coronary artery disease  On plavix   Other Active Problems Dementia - namenda, seroquel, zyprexa  CKD 3 1.89 -> 2.00  Hospital day # 0  Patient seen and examined by NP/APP with MD. MD to update note as needed.   Elmer Picker, DNP, FNP-BC [facility] Pager: [phone]  ATTENDING ATTESTATION:  Pt with afib used to be on eliquis and plavix. Eliquis stopped due to fall risk no with embolic strokes on MRI.  However, upon discussion with pt's daughter he  did not walk but had a tendency to slip off the bed butt first more than a fall from standing position. She is accepting of the risk of fatal ICH and wants him to be back on eliquis and plavix as risk of embolic stroke is higher. MRA cancelled as he had recent MRA in October.  Ok to [facility] with lovenox and transition back to eliquis when  he can take oral intake.   Discussed with primary team.   Neurology will sign off. F/u with his Neurologist at [facility].   Dr. Viviann Spare evaluated pt independently, reviewed imaging, chart, labs. Discussed and formulated plan with the Resident/APP. Changes were made to the note where appropriate. Please see APP/resident note above for details.   Total 36 minutes spent on counseling patient and coordinating care, writing notes and reviewing chart.    Syeda Prickett,MD    To contact Stroke Continuity provider, please refer to [email or web address]. After hours, contact General Neurology
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
## 2023-10-30 NOTE — Evaluation (Signed)
Clinical/Bedside Swallow Evaluation Patient Details  Name: Duane [name] MRN: [id number] Date of Birth: Nov 02, 1935  Today's Date: 10/30/2023 Time: SLP Start Time (ACUTE ONLY): 0920 SLP Stop Time (ACUTE ONLY): 0952 SLP Time Calculation (min) (ACUTE ONLY): 32 min  Past Medical History:  Past Medical History:  Diagnosis Date   A-fib (HCC)    Anemia    Angina pectoris (HCC)    Arthritis    Carpal tunnel syndrome    Cobalamin deficiency    Coronary artery disease    Diabetes mellitus without complication (HCC)    Eczema    Hyperlipemia    Hypertension    Kidney disease, chronic, stage III (moderate, EGFR 30-59 ml/min) (HCC)    Lung infiltrate    Myocardial infarct, old    Prostatic hypertrophy    Sleep apnea, obstructive    Squamous cell cancer of lip    Past Surgical History:  Past Surgical History:  Procedure Laterality Date   CORONARY ANGIOPLASTY WITH STENT PLACEMENT     POSTERIOR CERVICAL FUSION/FORAMINOTOMY N/A 02/01/2023   Procedure: CERVICAL FIVE - THORACIC ONE LAMINECTOMY, AND INSTRUMENTED FUSION;  Surgeon: Jadene Pierini, MD;  Location: [facility] OR;  Service: Neurosurgery;  Laterality: N/A;   HPI:  Duane [name] is a 88 y.o. male with a past medical history notable for HTN, paroxysmal A Fib, DM, CAD with stent, CKD III, previous CVA, HLD and dementia presenting for altered mental status. Most recent BSE completed 09/07/23 with recommendations for regular solids and thin liquids. MRI remarkable for ". New restricted diffusion is present within the posterior limb of the right internal capsule, likely responsible for the left symptoms cyst focal area of restricted diffusion is present in the medial right occipital lobe. A focal acute nonhemorrhagic infarct is present in the right hippocampus." CXR remarkable for possible bronchitis and developing bronchopneumonia.    Assessment / Plan / Recommendation  Clinical Impression  Pt was seen for a clinical swallow evaluation and  presents with suspected cognitive oropharyngeal dysphagia.  Pt was encountered awake/alert in bed with daughter at bedside.  Daughter reported that pt has rapidly declined since Friday and that at baseline he was able to communicate and consume a regular/thin liquid diet.  Pt was unable to follow commands to complete an oral mechanism examination at this time.  He was noted to try and communicate via verbalizations and gestures, but only a few words were intelligible.  Pt was seen with trials of ice chips, thin liquid via siphoned straw, and puree.  Trials were accepted passively into the pt's oral cavity and he exhibited oral holding, prolonged AP transit, reduced lingual manipulation and reduced labial closure.  Pt with some spontaneous swallows, but suctioning was required to fully clear boluses from oral cavity.  Boluses tended to pool in anterior portion of the pt's oral cavity behind bottom dentition.  Of note, pt was observed to have an open mouth posture at rest with tongue resting posteriorly in the oral cavity.  Daughter reported that this was a change from baseline.  Recommend continuation of NPO at this time with frequent oral care.  SLP will f/u tomorrow (12/16) to reassess readiness for PO intake.  If there are no improvements, pt may benefit from consideration of alternative means of nutrition vs liberalized diet for comfort.  Palliative consult may also be beneficial.  SLP Visit Diagnosis: Dysphagia, unspecified (R13.10)    Aspiration Risk  Moderate aspiration risk;Severe aspiration risk;Risk for inadequate nutrition/hydration    Diet Recommendation  NPO    Medication Administration: Via alternative means    Other  Recommendations Oral Care Recommendations: Oral care QID;Staff/trained caregiver to provide oral care    Recommendations for follow up therapy are one component of a multi-disciplinary discharge planning process, led by the attending physician.  Recommendations may be updated  based on patient status, additional functional criteria and insurance authorization.  Follow up Recommendations Follow physician's recommendations for discharge plan and follow up therapies      Assistance Recommended at Discharge    Functional Status Assessment Patient has had a recent decline in their functional status and/or demonstrates limited ability to make significant improvements in function in a reasonable and predictable amount of time  Frequency and Duration min 2x/week  2 weeks       Prognosis Prognosis for improved oropharyngeal function: Guarded Barriers to Reach Goals: Cognitive deficits;Language deficits      Swallow Study   General HPI: Duane [name] is a 88 y.o. male with a past medical history notable for HTN, paroxysmal A Fib, DM, CAD with stent, CKD III, previous CVA, HLD and dementia presenting for altered mental status. Most recent BSE completed 09/07/23 with recommendations for regular solids and thin liquids. MRI remarkable for ". New restricted diffusion is present within the posterior limb of the right internal capsule, likely responsible for the left symptoms cyst focal area of restricted diffusion is present in the medial right occipital lobe. A focal acute nonhemorrhagic infarct is present in the right hippocampus." CXR remarkable for possible bronchitis and developing bronchopneumonia. Type of Study: Bedside Swallow Evaluation Previous Swallow Assessment: See HPI Diet Prior to this Study: NPO Temperature Spikes Noted: Yes Respiratory Status: Room air History of Recent Intubation: No Behavior/Cognition: Alert;Confused Oral Cavity Assessment: Within Functional Limits Oral Care Completed by SLP: No Oral Cavity - Dentition: Adequate natural dentition Self-Feeding Abilities: Needs assist Patient Positioning: Upright in bed Baseline Vocal Quality: Normal Volitional Cough: Cognitively unable to elicit Volitional Swallow: Unable to elicit     Oral/Motor/Sensory Function Overall Oral Motor/Sensory Function: Other (comment) (unable to follow commands)   Ice Chips Ice chips: Impaired Presentation: Spoon Oral Phase Impairments: Reduced labial seal;Reduced lingual movement/coordination;Poor awareness of bolus Oral Phase Functional Implications: Prolonged oral transit;Oral residue Pharyngeal Phase Impairments: Suspected delayed Swallow   Thin Liquid Thin Liquid: Impaired Presentation: Straw Oral Phase Impairments: Reduced labial seal;Poor awareness of bolus;Reduced lingual movement/coordination Oral Phase Functional Implications: Oral residue;Oral holding Pharyngeal  Phase Impairments: Suspected delayed Swallow;Throat Clearing - Delayed    Nectar Thick Nectar Thick Liquid: Not tested   Honey Thick Honey Thick Liquid: Not tested   Puree Puree: Impaired Presentation: Spoon Oral Phase Impairments: Reduced labial seal;Reduced lingual movement/coordination;Poor awareness of bolus Oral Phase Functional Implications: Oral residue;Oral holding;Prolonged oral transit Pharyngeal Phase Impairments: Suspected delayed Swallow   Solid     Solid: Not tested     Eino Farber, M.S., CCC-SLP Acute Rehabilitation Services Office: [phone]  Shanon Rosser Livvy Spilman 10/30/2023,10:20 AM

## 2023-10-30 NOTE — Progress Notes (Signed)
PHARMACY - ANTICOAGULATION CONSULT NOTE  Pharmacy Consult for Therapeutic Lovenox dosing Indication: atrial fibrillation and stroke  Allergies  Allergen Reactions   Baclofen Other (See Comments)    Severe AMS, comatose, intubated   Bee Venom Anaphylaxis   Contrast Media [Iodinated Contrast Media] Anaphylaxis   Iodine-131 Anaphylaxis   Metrizamide Anaphylaxis   Metronidazole Anaphylaxis   Atorvastatin Other (See Comments)    Aching in legs   Lisinopril Cough    Patient Measurements: Weight: 93.6 kg (206 lb 5.6 oz)  Vital Signs: Temp: 98.3 F (36.8 C) (12/15 1243) Temp Source: Oral (12/15 1243) BP: 141/87 (12/15 1243) Pulse Rate: 97 (12/15 1243)  Labs: Recent Labs    10/29/23 0616 10/29/23 0619 10/29/23 0727 10/30/23 0707  HGB 11.6* 11.0*  --  10.3*  HCT 34.0* 34.3*  --  32.5*  PLT  --  208  --  207  APTT  --  28  --   --   LABPROT  --  13.5  --   --   INR  --  1.0  --   --   CREATININE 2.10*  --  1.89* 2.00*    Estimated Creatinine Clearance: 31 mL/min (A) (by C-G formula based on SCr of 2 mg/dL (H)).   Medical History: Past Medical History:  Diagnosis Date   A-fib (HCC)    Anemia    Angina pectoris (HCC)    Arthritis    Carpal tunnel syndrome    Cobalamin deficiency    Coronary artery disease    Diabetes mellitus without complication (HCC)    Eczema    Hyperlipemia    Hypertension    Kidney disease, chronic, stage III (moderate, EGFR 30-59 ml/min) (HCC)    Lung infiltrate    Myocardial infarct, old    Prostatic hypertrophy    Sleep apnea, obstructive    Squamous cell cancer of lip     Medications:  Medications Prior to Admission  Medication Sig Dispense Refill Last Dose/Taking   Acetaminophen (TYLENOL 8 HOUR PO) Take 1 tablet by mouth in the morning, at noon, and at bedtime.   10/28/2023 Evening   amLODipine (NORVASC) 5 MG tablet Take 5 mg by mouth in the morning and at bedtime.   10/28/2023 Bedtime   clopidogrel (PLAVIX) 75 MG tablet Take 1  tablet (75 mg total) by mouth daily.   10/28/2023 Morning   doxycycline (VIBRA-TABS) 100 MG tablet Take 100 mg by mouth 2 (two) times daily.   10/28/2023 Evening   EPINEPHrine 0.3 mg/0.3 mL IJ SOAJ injection Inject 0.3 mg into the muscle once as needed (severe allergic reaction).   Taking As Needed   escitalopram (LEXAPRO) 5 MG tablet Take 10 mg by mouth daily.   10/28/2023 Morning   ezetimibe (ZETIA) 10 MG tablet Take 1 tablet (10 mg total) by mouth daily.   10/28/2023 Morning   hydrochlorothiazide (HYDRODIURIL) 12.5 MG tablet Take 25 mg by mouth daily.   10/28/2023 Morning   ipratropium-albuterol (DUONEB) 0.5-2.5 (3) MG/3ML SOLN Inhale 3 mLs into the lungs every 4 (four) hours as needed (SOB).   Taking As Needed   LANTUS SOLOSTAR 100 UNIT/ML Solostar Pen Inject 3 Units into the skin every evening.   10/28/2023 Evening   memantine (NAMENDA) 5 MG tablet Take 5 mg by mouth daily.   10/28/2023 Morning   Menthol, Topical Analgesic, (BIOFREEZE COOL THE PAIN) 4 % GEL Apply 1 Application topically daily in the afternoon. Apply to neck/shoulders.   10/28/2023 Evening  Menthol, Topical Analgesic, (EUCERIN ITCH RELIEF) 0.1 % LOTN Apply 1 Application topically in the morning and at bedtime. Apply to lower legs   10/28/2023 Evening   metoprolol succinate (TOPROL-XL) 50 MG 24 hr tablet Take 100 mg by mouth daily.   10/28/2023 Morning   mirtazapine (REMERON) 7.5 MG tablet Take 7.5 mg by mouth every other day.   10/18/2023 Bedtime   Multiple Vitamin (MULTIVITAMIN) tablet Take 1 tablet by mouth daily.   10/28/2023 Morning   nitroGLYCERIN (NITROSTAT) 0.4 MG SL tablet Place 0.4 mg under the tongue every 5 (five) minutes as needed for chest pain.   Taking As Needed   OLANZapine (ZYPREXA) 2.5 MG tablet Take 2.5 mg by mouth in the morning and at bedtime.   10/28/2023 Evening   pantoprazole (PROTONIX) 20 MG tablet Take 20 mg by mouth daily.   10/28/2023 Morning   Probiotic Product (BACID) CAPS Take 1 capsule by mouth  daily.   10/28/2023 Morning   QUEtiapine (SEROQUEL) 25 MG tablet Take 50 mg by mouth at bedtime.   10/28/2023 Evening   sitaGLIPtin-metformin (JANUMET) 50-1000 MG tablet Take 1 tablet by mouth daily.   10/28/2023 Morning   tamsulosin (FLOMAX) 0.4 MG CAPS capsule Take 1 capsule (0.4 mg total) by mouth daily. 30 capsule  10/28/2023 Evening   Calcium Carb-Cholecalciferol (CALCIUM + VITAMIN D3) 500-5 MG-MCG TABS Take 1 tablet by mouth daily. (Patient not taking: Reported on 10/29/2023)   Not Taking   cephALEXin (KEFLEX) 500 MG capsule Take 1 capsule (500 mg total) by mouth 2 (two) times daily. Take one in evening of 10/26, and one in morning of 10/27 to complete course for UTI. (Patient not taking: Reported on 09/12/2023)   Not Taking   insulin detemir (LEVEMIR) 100 UNIT/ML injection Inject 0.05 mLs (5 Units total) into the skin at bedtime. (Patient not taking: Reported on 10/29/2023)   Not Taking   memantine (NAMENDA) 10 MG tablet Take 10 mg by mouth 2 (two) times daily. (Patient not taking: Reported on 10/29/2023)   Not Taking   OLANZapine (ZYPREXA) injection Inject 0.5 mLs (2.5 mg total) into the muscle daily as needed for agitation. (Patient not taking: Reported on 09/12/2023) 1 each 3 Not Taking   Omega-3 1400 MG CAPS Take 1,400 mg by mouth in the morning and at bedtime. (Patient not taking: Reported on 10/29/2023)   Not Taking   sodium chloride 1 g tablet Take 1 g by mouth daily. (Patient not taking: Reported on 10/29/2023)   Not Taking   Scheduled:   clopidogrel  75 mg Oral Daily   enoxaparin (LOVENOX) injection  1 mg/kg Subcutaneous Q12H    Assessment: Patient is an 88 yo male presenting from SNF with acute L weakness, aphasia, and AMS. Pertinent PMH of dementia, CKD3, afib. Patient previously on Apixaban for Afib this October, but was discontinued prior to discharge after a risk/benefit discussion with family. Per neuro, presentation consistent with acute toxic/metabolic/infectious  encephalopathy vs stroke vs seizure; signed off. EEG negative, CT head showing no acute infarct/hemorrhage. Patient found to be in acute atrial flutter 12/15. Pharmacy consulted for Lovenox in setting of atrial flutter and new embolic stroke.  Hgb 10-11s, PLTc wnl. Scr 2  Goal of Therapy:  Anti-Xa level 0.6-1 units/ml 4hrs after LMWH dose given Monitor platelets by anticoagulation protocol: Yes   Plan:  Initiate Enoxaparin 1mg /kg Q12H  Follow Up afternoon BMET - to eval renal function for dosing  Consider obtaining therapeutic AntiXa level in the future  Monitor CBC, renal function, s/sx of bleeding   Malik Paar 10/30/2023,2:12 PM

## 2023-10-31 DIAGNOSIS — G9340 Encephalopathy, unspecified: Secondary | ICD-10-CM | POA: Diagnosis not present

## 2023-10-31 LAB — CBC
HCT: 32.2 % — ABNORMAL LOW (ref 39.0–52.0)
Hemoglobin: 10.1 g/dL — ABNORMAL LOW (ref 13.0–17.0)
MCH: 26.4 pg (ref 26.0–34.0)
MCHC: 31.4 g/dL (ref 30.0–36.0)
MCV: 84.3 fL (ref 80.0–100.0)
Platelets: 219 10*3/uL (ref 150–400)
RBC: 3.82 MIL/uL — ABNORMAL LOW (ref 4.22–5.81)
RDW: 15.2 % (ref 11.5–15.5)
WBC: 10.4 10*3/uL (ref 4.0–10.5)
nRBC: 0 % (ref 0.0–0.2)

## 2023-10-31 LAB — BASIC METABOLIC PANEL
Anion gap: 12 (ref 5–15)
BUN: 19 mg/dL (ref 8–23)
CO2: 19 mmol/L — ABNORMAL LOW (ref 22–32)
Calcium: 8.8 mg/dL — ABNORMAL LOW (ref 8.9–10.3)
Chloride: 103 mmol/L (ref 98–111)
Creatinine, Ser: 1.72 mg/dL — ABNORMAL HIGH (ref 0.61–1.24)
GFR, Estimated: 38 mL/min — ABNORMAL LOW (ref >60)
Glucose, Bld: 140 mg/dL — ABNORMAL HIGH (ref 70–99)
Potassium: 3.7 mmol/L (ref 3.5–5.1)
Sodium: 134 mmol/L — ABNORMAL LOW (ref 135–145)

## 2023-10-31 LAB — GLUCOSE, CAPILLARY
Glucose-Capillary: 135 mg/dL — ABNORMAL HIGH (ref 70–99)
Glucose-Capillary: 163 mg/dL — ABNORMAL HIGH (ref 70–99)

## 2023-10-31 MED ORDER — DEXTROSE IN LACTATED RINGERS 5 % IV SOLN: INTRAVENOUS | Status: AC

## 2023-10-31 NOTE — Progress Notes (Signed)
Orthopedic Tech Progress Note Patient Details:  Duane [name] [birth year] [id number]  Called into [facility]. Patient ID: Duane [name], male   DOB: 1934-12-13, 88 y.o.   MRN: [id number]  Sherilyn Banker 10/31/2023, 6:08 PM

## 2023-10-31 NOTE — Progress Notes (Signed)
Speech Language Pathology Treatment: Dysphagia  Patient Details Name: Duane [name] MRN: [id number] DOB: 1934/11/28 Today's Date: 10/31/2023 Time: 7829-5621 SLP Time Calculation (min) (ACUTE ONLY): 24 min  Assessment / Plan / Recommendation Clinical Impression  Pt seen for f/u dysphagia tx with po trials given mod-max multimodal cues provided by SLP d/t pt lethargy/decreased processing of information/deconditioned state.  Pt able to protrude tongue and attempt oral care, but mod A needed to complete.  Pt exhibiting oropharyngeal dysphagia with oral holding, anterior loss, min tongue manipulation/propulsion with ice chips/puree and open mouth posture noted throughout session.  Pt with multiple sub-swallows after all intake and limited lingual lateral/posterior movement despite cueing.  Speech min intelligible at one word level and pt gesturing thumbs up or down when asked simple questions.  Immediate cough with thin via tsp and delayed cough with ice chips.  Puree bolus given with swallow not initiated and eventual oral suctioning provided.  Pt is at risk for decreased nutrition/hydration and aspiration risk is high.  Recommend medications provided via alternative means.  May consider temporary AMN if symptoms persist.  ST will continue to f/u for dysphagia tx/management.     HPI HPI: Duane [name] is a 88 y.o. male with a past medical history notable for HTN, paroxysmal A Fib, DM, CAD with stent, CKD III, previous CVA, HLD and dementia presenting for altered mental status. Most recent BSE completed 09/07/23 with recommendations for regular solids and thin liquids. MRI remarkable for ". New restricted diffusion is present within the posterior limb of the right internal capsule, likely responsible for the left symptoms cyst focal area of restricted diffusion is present in the medial right occipital lobe. A focal acute nonhemorrhagic infarct is present in the right hippocampus." CXR remarkable for  possible bronchitis and developing bronchopneumonia.ST completed BSE on 10/30/23 recs for NPO.  ST f/u for po readiness.      SLP Plan  Continue with current plan of care      Recommendations for follow up therapy are one component of a multi-disciplinary discharge planning process, led by the attending physician.  Recommendations may be updated based on patient status, additional functional criteria and insurance authorization.    Recommendations  Diet recommendations: NPO Medication Administration: Via alternative means                  Oral care QID;Staff/trained caregiver to provide oral care   Frequent or constant Supervision/Assistance Dysphagia, oropharyngeal phase (R13.12)     Continue with current plan of care     Pat Soleil Mas,M.S.,CCC-SLP 10/31/2023, 11:56 AM

## 2023-10-31 NOTE — IPAL (Addendum)
  Interdisciplinary Goals of Care Family Meeting   Date carried out: 10/31/2023  Location of the meeting: Phone conference  Member's involved: Physician and Family Member or next of kin  Durable Power of Attorney or acting medical decision maker: Angie Curlie    Discussion: We discussed goals of care for [employer]. Mr Calabria is hospitalized after an acute cerebral infarct complicated by severe dysphagia and hemiplegia. His prognosis is poor. I fear he may not recover his ability to swallow food safely without high risk of aspiration. This is supported by findings of developing bronchopneumonia on chest x-ray despite n.p.o. status since admission. I shared by concerns with daughter, Alyson Reedy. Family is hopeful that he will recover. It is reasonable to hold hope for now. If he doesn't show signs of recovery in 1-2 days I would not offer feeding support as it would not benefit this person. For now, we will support with IV fluid hydration during the day and measures to improve rest and sleep at night, including limiting interruptions. Family will meet to discuss changing code status to DNR.  Update at 3:52 PM Spoke with son, Chip and related above to him as well. We discussed code status, and are in agreement that Mr. Mullineaux should not be resuscitated or intubated in the case of cardiac arrest or impending respiratory arrest. This will not change care delivered up until that point. We will meet again on Wednesday. Hopefully Mr. Sobalvarro will have improved, but if not, we will talk about hospice care at that time.  Code status:   Code Status: Limited: Do not attempt resuscitation (DNR) -DNR-LIMITED -Do Not Intubate/DNI    Disposition: Continue current acute care  Time spent for the meeting: 25 minutes    Marrianne Mood, MD  10/31/2023, 2:12 PM
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
## 2023-10-31 NOTE — TOC CM/SW Note (Signed)
Transition of Care [facility]) - Inpatient Brief Assessment   Patient Details  Name: Duane [name] MRN: [id number] Date of Birth: [birth year]  [facility]) CM/SW Contact:    Baldemar Lenis, LCSW Phone Number: 10/31/2023, 12:59 PM   Clinical Narrative:   Patient from [facility] memory care, noting recommendations for SNF at discharge. Patient currently NPO. CSW to follow.    Transition of Care Asessment: Insurance and Status: Insurance coverage has been reviewed Patient has primary care physician: Yes Home environment has been reviewed: Memory Care   Prior/Current Home Services: Current home services Social Drivers of Health Review: SDOH reviewed no interventions necessary Readmission risk has been reviewed: Yes Transition of care needs: transition of care needs identified, TOC will continue to follow

## 2023-10-31 NOTE — Evaluation (Addendum)
Occupational Therapy Evaluation Patient Details Name: Duane [name] MRN: [id number] DOB: 1935-04-13 Today's Date: 10/31/2023   History of Present Illness Pt is 88 yo presenting from [facility] care facility  for worsening left sided weakness and dysarthria.  MRI brain with multifocal infarcts, suspicious for emboli with history of AF.  PMH positive for a-fib, arthritis, CAD, DM, Eczema, HTN, dementia, HLD, CKD III, sleep apnea, MI, prostatic hypertrophy, MI, and  C5-C7 Laminectomy and C7-T1 fusion 01/2023, L CVA   Clinical Impression   Pt unable to provide PLOF, but per notes uses w/c at baseline, and per admission 08/2023 pt was needing up to mod A for ADLs, pt currently with L deficits, no AROM noted and mild tone in LUE. L resting hand splint ordered and will create wear schedule in future sessions. Pt with difficulty tracking to L past midline. Needs max-total A for ADLs, max +2 for bed mobility and total A+2 for transfers with 2 person HHA, unable to clear hips from EOB.  Pt  presenting with impairments listed below, will follow acutely. Patient will benefit from continued inpatient follow up therapy, <3 hours/day to maximize safety/ind with ADL/functional mobility.       If plan is discharge home, recommend the following: A little help with walking and/or transfers;A little help with bathing/dressing/bathroom;Assistance with cooking/housework;Direct supervision/assist for medications management;Direct supervision/assist for financial management;Assist for transportation;Help with stairs or ramp for entrance;Supervision due to cognitive status    Functional Status Assessment  Patient has had a recent decline in their functional status and demonstrates the ability to make significant improvements in function in a reasonable and predictable amount of time.  Equipment Recommendations  Other (comment) (defer)    Recommendations for Other Services PT consult     Precautions /  Restrictions Precautions Precautions: Fall Restrictions Weight Bearing Restrictions Per Provider Order: No      Mobility Bed Mobility Overal bed mobility: Needs Assistance Bed Mobility: Supine to Sit     Supine to sit: Max assist, +2 for physical assistance          Transfers Overall transfer level: Needs assistance Equipment used: 2 person hand held assist Transfers: Sit to/from Stand Sit to Stand: Total assist, +2 physical assistance                  Balance Overall balance assessment: Needs assistance Sitting-balance support: No upper extremity supported, Feet supported Sitting balance-Leahy Scale: Good     Standing balance support: Bilateral upper extremity supported, During functional activity Standing balance-Leahy Scale: Poor Standing balance comment: minimally able to clear hips                           ADL either performed or assessed with clinical judgement   ADL Overall ADL's : Needs assistance/impaired Eating/Feeding: NPO   Grooming: Maximal assistance   Upper Body Bathing: Minimal assistance;Sitting Upper Body Bathing Details (indicate cue type and reason): wiping mouth Lower Body Bathing: Maximal assistance   Upper Body Dressing : Maximal assistance   Lower Body Dressing: Maximal assistance   Toilet Transfer: Total assistance;+2 for physical assistance   Toileting- Clothing Manipulation and Hygiene: Total assistance       Functional mobility during ADLs: Total assistance;+2 for physical assistance       Vision   Vision Assessment?: Vision impaired- to be further tested in functional context;Yes Ocular Range of Motion: Restricted on the left Alignment/Gaze Preference: Head turned;Gaze right Tracking/Visual Pursuits:  Decreased smoothness of eye movement to LEFT inferior field;Decreased smoothness of eye movement to LEFT superior field;Decreased smoothness of horizontal tracking Visual Fields: Left visual field  deficit;Impaired-to be further tested in functional context Additional Comments: tracks minimally past midline to L     Perception Perception: Not tested       Praxis Praxis: Not tested       Pertinent Vitals/Pain       Extremity/Trunk Assessment Upper Extremity Assessment Upper Extremity Assessment: Defer to OT evaluation LUE Deficits / Details: no active movement noted, noted tone mild tone throughout   Lower Extremity Assessment Lower Extremity Assessment: LLE deficits/detail LLE Deficits / Details: PROM WFL; no active movement noted   Cervical / Trunk Assessment Cervical / Trunk Assessment: Normal   Communication Communication Communication: Difficulty communicating thoughts/reduced clarity of speech Cueing Techniques: Verbal cues;Tactile cues   Cognition Arousal: Lethargic Behavior During Therapy: WFL for tasks assessed/performed Overall Cognitive Status: Impaired/Different from baseline                                 General Comments: pt groaning throughout session, states "Duane [name]" for his name and "Bye" to therapist at end of session. Follows single step commands inconsistently/with incr time     General Comments  suctioned pt's mouth    Exercises     Shoulder Instructions      Home Living Family/patient expects to be discharged to:: Other (Comment) ([facility] memory care)                             Home Equipment: Wheelchair - power;Shower seat;Grab bars - tub/shower;Grab bars - toilet;Rolling Walker (2 wheels) (per previous medical record)          Prior Functioning/Environment Prior Level of Function : Needs assist             Mobility Comments: w/c baseline per notes; was min assist with bed mobility and ambulation x 75 ft with RW in Oct 2024 (per PT notes) ADLs Comments: pt unable to state, assume assist        OT Problem List: Impaired balance (sitting and/or standing);Decreased safety awareness;Decreased  knowledge of precautions;Decreased knowledge of use of DME or AE      OT Treatment/Interventions: Self-care/ADL training;Therapeutic activities;Balance training    OT Goals(Current goals can be found in the care plan section) Acute Rehab OT Goals Patient Stated Goal: none stated OT Goal Formulation: With patient/family Time For Goal Achievement: 11/14/23 Potential to Achieve Goals: Fair  OT Frequency: Min 1X/week    Co-evaluation PT/OT/SLP Co-Evaluation/Treatment: Yes Reason for Co-Treatment: Complexity of the patient's impairments (multi-system involvement);Necessary to address cognition/behavior during functional activity;To address functional/ADL transfers   OT goals addressed during session: ADL's and self-care;Strengthening/ROM      AM-PAC OT "6 Clicks" Daily Activity     Outcome Measure Help from another person eating meals?: A Lot Help from another person taking care of personal grooming?: A Lot Help from another person toileting, which includes using toliet, bedpan, or urinal?: A Lot Help from another person bathing (including washing, rinsing, drying)?: A Lot Help from another person to put on and taking off regular upper body clothing?: A Lot Help from another person to put on and taking off regular lower body clothing?: A Lot 6 Click Score: 12   End of Session Equipment Utilized During Treatment: Gait belt Nurse Communication: Mobility  status  Activity Tolerance: Patient tolerated treatment well Patient left: in bed;with call bell/phone within reach;with bed alarm set  OT Visit Diagnosis: Unsteadiness on feet (R26.81);Other abnormalities of gait and mobility (R26.89);Muscle weakness (generalized) (M62.81);Cognitive communication deficit (R41.841);Other symptoms and signs involving cognitive function                Time: 1610-9604 OT Time Calculation (min): 21 min Charges:  OT General Charges $OT Visit: 1 Visit OT Evaluation $OT Eval Moderate Complexity: 1  Mod  Lynnell Fiumara K, OTD, OTR/L SecureChat Preferred Acute Rehab [phone]   Carver Fila Koonce 10/31/2023, 12:19 PM

## 2023-10-31 NOTE — Evaluation (Signed)
Physical Therapy Evaluation Patient Details Name: Duane [name] MRN: [id number] DOB: February 21, 1935 Today's Date: 10/31/2023  History of Present Illness  Pt is 88 yo presenting from [facility] care facility  for worsening left sided weakness and dysarthria.  MRI brain with multifocal infarcts, suspicious for emboli with history of AF.  PMH positive for a-fib, arthritis, CAD, DM, Eczema, HTN, dementia, HLD, CKD III, sleep apnea, MI, prostatic hypertrophy, MI, and  C5-C7 Laminectomy and C7-T1 fusion 01/2023, L CVA  Clinical Impression   Pt admitted secondary to problem above with deficits below. PTA patient was a resident of a memory care unit. Chart reports he was wheelchair bound, however PT notes from 08/2023 show he was walking with RW and min assist. Patient aphasic and unable to clarify prior functional status.  Pt currently requires +2 max assist for bed mobility, up to mod assist for sitting balance, and unable to stand with +2 total assist. As this represents a functional decline, PT to follow acutely. Anticipate patient may benefit from PT to address problems listed below as he was able to follow simple commands with Rt extremities. Will continue to follow acutely to maximize functional mobility independence and safety.  Unclear if pt can return to memory care unit at current functional level or if would benefit from skilled PT <3 hrs/day facility.          If plan is discharge home, recommend the following:     Can travel by private vehicle   No    Equipment Recommendations None recommended by PT  Recommendations for Other Services       Functional Status Assessment Patient has had a recent decline in their functional status and demonstrates the ability to make significant improvements in function in a reasonable and predictable amount of time.     Precautions / Restrictions Precautions Precautions: Fall Restrictions Weight Bearing Restrictions Per Provider Order: No       Mobility  Bed Mobility Overal bed mobility: Needs Assistance Bed Mobility: Sidelying to Sit, Rolling, Sit to Supine Rolling: Max assist, +2 for physical assistance Sidelying to sit: Max assist, +2 for physical assistance, HOB elevated   Sit to supine: Max assist, +2 for physical assistance        Transfers Overall transfer level: Needs assistance   Transfers: Sit to/from Stand Sit to Stand: Total assist, +2 physical assistance           General transfer comment: Attempted standing with little to no effort by patient (even with rt extremities)    Ambulation/Gait               General Gait Details: unable  Stairs            Wheelchair Mobility     Tilt Bed    Modified Rankin (Stroke Patients Only) Modified Rankin (Stroke Patients Only) Pre-Morbid Rankin Score: Severe disability Modified Rankin: Severe disability     Balance Overall balance assessment: Needs assistance Sitting-balance support: No upper extremity supported, Feet supported Sitting balance-Leahy Scale: Poor Sitting balance - Comments: tends to lose balance posteriorly       Standing balance comment: unable to stand with +2                             Pertinent Vitals/Pain Pain Assessment Pain Assessment: Faces Faces Pain Scale: No hurt    Home Living Family/patient expects to be discharged to:: Other (Comment) [employer] memory  care)                 Home Equipment: Wheelchair - power;Shower seat;Grab bars - tub/shower;Grab bars - toilet;Rolling Walker (2 wheels) (per previous medical record)      Prior Function Prior Level of Function : Needs assist             Mobility Comments: w/c baseline per notes; was min assist with bed mobility and ambulation x 75 ft with RW in Oct 2024 (per PT notes) ADLs Comments: pt unable to state, assume assist     Extremity/Trunk Assessment   Upper Extremity Assessment Upper Extremity Assessment: Defer to  OT evaluation LUE Deficits / Details: no active movement noted, noted tone mild tone throughout    Lower Extremity Assessment Lower Extremity Assessment: LLE deficits/detail LLE Deficits / Details: PROM WFL; no active movement noted    Cervical / Trunk Assessment Cervical / Trunk Assessment: Normal  Communication   Communication Communication: Difficulty communicating thoughts/reduced clarity of speech Cueing Techniques: Verbal cues;Tactile cues  Cognition Arousal: Alert Behavior During Therapy: WFL for tasks assessed/performed Overall Cognitive Status: Difficult to assess                                 General Comments: pt groaning throughout session, states "[name]" for his name and "Bye" to therapist at end of session. Follows single step commands inconsistently/with incr time        General Comments General comments (skin integrity, edema, etc.): suctioned pt's mouth    Exercises     Assessment/Plan    PT Assessment Patient needs continued PT services  PT Problem List Decreased mobility;Decreased balance;Decreased safety awareness;Decreased knowledge of use of DME;Impaired sensation;Impaired tone       PT Treatment Interventions DME instruction;Therapeutic exercise;Gait training;Balance training;Neuromuscular re-education;Functional mobility training;Therapeutic activities;Patient/family education;Wheelchair mobility training    PT Goals (Current goals can be found in the Care Plan section)  Acute Rehab PT Goals Patient Stated Goal: unable to state PT Goal Formulation: Patient unable to participate in goal setting Time For Goal Achievement: 11/14/23 Potential to Achieve Goals: Fair    Frequency Min 1X/week     Co-evaluation   Reason for Co-Treatment: Complexity of the patient's impairments (multi-system involvement);Necessary to address cognition/behavior during functional activity;To address functional/ADL transfers   OT goals addressed during  session: ADL's and self-care;Strengthening/ROM       AM-PAC PT "6 Clicks" Mobility  Outcome Measure Help needed turning from your back to your side while in a flat bed without using bedrails?: Total Help needed moving from lying on your back to sitting on the side of a flat bed without using bedrails?: Total Help needed moving to and from a bed to a chair (including a wheelchair)?: Total Help needed standing up from a chair using your arms (e.g., wheelchair or bedside chair)?: Total Help needed to walk in hospital room?: Total Help needed climbing 3-5 steps with a railing? : Total 6 Click Score: 6    End of Session Equipment Utilized During Treatment: Gait belt Activity Tolerance: Patient tolerated treatment well Patient left: in bed;with call bell/phone within reach;with bed alarm set Nurse Communication: Mobility status PT Visit Diagnosis: Other abnormalities of gait and mobility (R26.89);Unsteadiness on feet (R26.81);Hemiplegia and hemiparesis Hemiplegia - Right/Left: Left Hemiplegia - caused by: Cerebral infarction    Time: 1610-9604 PT Time Calculation (min) (ACUTE ONLY): 20 min   Charges:   PT Evaluation $PT  Eval Low Complexity: 1 Low   PT General Charges $$ ACUTE PT VISIT: 1 Visit          Jerolyn Center, PT Acute Rehabilitation Services  Office [phone]   Zena Amos 10/31/2023, 12:12 PM

## 2023-10-31 NOTE — Plan of Care (Signed)

## 2023-11-01 ENCOUNTER — Other Ambulatory Visit: Payer: Self-pay

## 2023-11-01 DIAGNOSIS — G9340 Encephalopathy, unspecified: Secondary | ICD-10-CM | POA: Diagnosis not present

## 2023-11-01 MED ORDER — HALOPERIDOL LACTATE 5 MG/ML IJ SOLN: 0.5000 mg | INTRAMUSCULAR | Status: DC | PRN

## 2023-11-01 MED ORDER — LACTATED RINGERS IV BOLUS
500.0000 mL | Freq: Once | INTRAVENOUS | Status: AC
  Administered 2023-11-01: 500 mL via INTRAVENOUS

## 2023-11-01 MED ORDER — LORAZEPAM 2 MG/ML IJ SOLN: 1.0000 mg | INTRAMUSCULAR | Status: DC | PRN

## 2023-11-01 MED ORDER — MORPHINE SULFATE (PF) 2 MG/ML IV SOLN
1.0000 mg | INTRAVENOUS | Status: DC | PRN
Start: 2023-11-01 — End: 2023-11-02
  Administered 2023-11-02: 1 mg via INTRAVENOUS
  Filled 2023-11-01 (×2): qty 1

## 2023-11-01 MED ORDER — GLYCOPYRROLATE 0.2 MG/ML IJ SOLN: 0.2000 mg | INTRAMUSCULAR | Status: DC | PRN

## 2023-11-01 MED ORDER — DEXTROSE IN LACTATED RINGERS 5 % IV SOLN: INTRAVENOUS | Status: DC

## 2023-11-01 NOTE — Progress Notes (Addendum)
Speech Language Pathology Treatment: Dysphagia  Patient Details Name: Duane [name] MRN: [id number] DOB: 25-Mar-1935 Today's Date: 11/01/2023 Time: 8756-4332 SLP Time Calculation (min) (ACUTE ONLY): 13 min  Assessment / Plan / Recommendation Clinical Impression  Pt continues to demonstrate severe oral dysphagia. He is lethargic but able to wake for dysphagia intervention. Medical team arrived as SLP working with pt and therapist reiterated/agreed with Palliative care. He followed 2 commands (thumbs up and was able to slightly protrude tongue) however he has snoring respirations, open mouth breathing posture, no labial seal on spoon and was unable to attempt to manipulate or move applesauce to posterior oral cavity given cues. He did swallow twice but this was his saliva as he could not propel puree bolus which was eventually suctioned from oral cavity. Attempts to talk were more vocalic vocalizations and was unable to form words. Medical team stated family asking if any interventions for him to swallow and SLP responded he has to be able to manipulate the bolus orally and propel and that ST would continue working with pt but that deficits appeared severe presently. Palliative care recommended for goals re: overall status and nutrition.     HPI HPI: Duane [name] is a 88 y.o. male with a past medical history notable for HTN, paroxysmal A Fib, DM, CAD with stent, CKD III, previous CVA, HLD and dementia presenting for altered mental status. Most recent BSE completed 09/07/23 with recommendations for regular solids and thin liquids. MRI remarkable for ". New restricted diffusion is present within the posterior limb of the right internal capsule, likely responsible for the left symptoms cyst focal area of restricted diffusion is present in the medial right occipital lobe. A focal acute nonhemorrhagic infarct is present in the right hippocampus." CXR remarkable for possible bronchitis and developing  bronchopneumonia.ST completed BSE on 10/30/23 recs for NPO.  ST f/u for po readiness.      SLP Plan  Continue with current plan of care      Recommendations for follow up therapy are one component of a multi-disciplinary discharge planning process, led by the attending physician.  Recommendations may be updated based on patient status, additional functional criteria and insurance authorization.    Recommendations  NPO                  Oral care QID;Staff/trained caregiver to provide oral care   Frequent or constant Supervision/Assistance Dysphagia, oropharyngeal phase (R13.12)     Continue with current plan of care     Royce Macadamia  11/01/2023, 10:17 AM

## 2023-11-01 NOTE — Progress Notes (Signed)
   11/01/23 0800  Assess: MEWS Score  Temp 99.8 F (37.7 C)  BP (!) 150/89  MAP (mmHg) 109  Pulse Rate (!) 122  Resp 20  SpO2 100 %  O2 Device Room Air  Assess: MEWS Score  MEWS Temp 0  MEWS Systolic 0  MEWS Pulse 2  MEWS RR 0  MEWS LOC 0  MEWS Score 2  MEWS Score Color Yellow  Assess: if the MEWS score is Yellow or Red  Were vital signs accurate and taken at a resting state? Yes  Does the patient meet 2 or more of the SIRS criteria? No  MEWS guidelines implemented  Yes, yellow  Treat  MEWS Interventions Considered administering scheduled or prn medications/treatments as ordered  Take Vital Signs  Increase Vital Sign Frequency  Yellow: Q2hr x1, continue Q4hrs until patient remains green for 12hrs  Escalate  MEWS: Escalate Yellow: Discuss with charge nurse and consider notifying provider and/or RRT  Notify: Charge Nurse/RN  Name of Charge Nurse/RN Notified French Ana, RN  Provider Notification  Provider Name/Title Faith Rogue DO  Date Provider Notified 11/01/23  Time Provider Notified 0802  Method of Notification Page  Notification Reason New onset of dysrhythmia  Provider response See new orders  Date of Provider Response 11/01/23  Time of Provider Response 0808  Assess: SIRS CRITERIA  SIRS Temperature  0  SIRS Respirations  0  SIRS Pulse 1  SIRS WBC 0  SIRS Score Sum  1

## 2023-11-01 NOTE — TOC Initial Note (Signed)
Transition of Care [facility]) - Initial/Assessment Note    Patient Details  Name: Duane [name] MRN: [id number] Date of Birth: 09/21/1935  Transition of Care [facility]) CM/SW Contact:    Baldemar Lenis, LCSW Phone Number: 11/01/2023, 2:51 PM  Clinical Narrative:          CSW updated by medical team that patient's family has chosen comfort care, requesting residential hospice in [place]. CSW sent referral to [facility], awaiting review. CSW to follow.         Expected Discharge Plan: Hospice Medical Facility Barriers to Discharge: Continued Medical Work up, Hospice Bed not available   Patient Goals and CMS Choice Patient states their goals for this hospitalization and ongoing recovery are:: patient unable to participate in goal setting, not fully oriented CMS Medicare.gov Compare Post Acute Care list provided to:: Patient Represenative (must comment) Choice offered to / list presented to : Adult Children [place] ownership interest in [facility].provided to:: Adult Children    Expected Discharge Plan and Services     Post Acute Care Choice: Hospice Living arrangements for the past 2 months: Assisted Living Facility                                      Prior Living Arrangements/Services Living arrangements for the past 2 months: Assisted Living Facility Lives with:: Facility Resident Patient language and need for interpreter reviewed:: No Do you feel safe going back to the place where you live?: Yes      Need for Family Participation in Patient Care: Yes (Comment) Care giver support system in place?: Yes (comment)   Criminal Activity/Legal Involvement Pertinent to Current Situation/Hospitalization: No - Comment as needed  Activities of Daily Living      Permission Sought/Granted Permission sought to share information with : Facility Medical sales representative, Family Supports Permission granted to share information with : Yes, Verbal  Permission Granted  Share Information with NAME: Zenovia Jarred, Chip  Permission granted to share info w AGENCY: [facility]  Permission granted to share info w Relationship: Children     Emotional Assessment   Attitude/Demeanor/Rapport: Unable to Assess Affect (typically observed): Unable to Assess Orientation: : Oriented to Self Alcohol / Substance Use: Not Applicable Psych Involvement: No (comment)  Admission diagnosis:  Acute encephalopathy [G93.40] Altered mental status, unspecified altered mental status type [R41.82] Patient Active Problem List   Diagnosis Date Noted   Acute cardioembolic stroke (HCC) 10/30/2023   Acute encephalopathy 10/29/2023   Cerebral infarction (HCC) 09/07/2023   Closed cervical spine fracture (HCC) 02/02/2023   Status post surgery 02/01/2023   OSA (obstructive sleep apnea)    Vascular dementia, mild, with mood disturbance (HCC) 08/15/2021   Overweight (BMI 25.0-29.9) 05/12/2021   History of CVA (cerebrovascular accident) 05/11/2021   CKD (chronic kidney disease), stage III (HCC) 05/11/2021   Hyperlipidemia 05/11/2021   Hyponatremia 09/05/2019   Diarrhea 09/05/2019   Encephalopathy 07/23/2019   Essential hypertension 09/08/2016   PAF (paroxysmal atrial fibrillation) (HCC) 09/08/2016   Diabetes mellitus type 2, controlled (HCC) 09/08/2016   ARF (acute renal failure) (HCC) 09/08/2016   Normochromic normocytic anemia 09/08/2016   Abdominal pain 09/08/2016   PCP:  Karna Dupes, MD Pharmacy:   [facility] [address] [place] [address] Phone: [phone] Fax: [phone]     Social Drivers of Health (SDOH)  Social History: SDOH Screenings   Food Insecurity: No Food Insecurity (11/01/2023)  Housing: Low Risk  (11/01/2023)  Transportation Needs: No Transportation Needs (11/01/2023)  Utilities: Not At Risk (11/01/2023)  Tobacco Use: Low Risk  (10/29/2023)   SDOH Interventions:      Readmission Risk Interventions     No data to display

## 2023-11-01 NOTE — IPAL (Cosign Needed)
  Interdisciplinary Goals of Care Family Meeting   Date carried out: 11/01/2023  Location of the meeting: Conference room  Member's involved: Family members, Dr. Benito Mccreedy, Myself, Medical student   Durable Power of Attorney or acting medical decision maker: yes    Discussion: We discussed goals of care for Duane [name] .  Patient's family was updated on the severity of [name] condition and that he will most likely not recover from his stroke. Patient's family would like to begin the transition of comfort care while they wait for inpatient hospice.   Code status:   Code Status: Do not attempt resuscitation (DNR) - Comfort care   Disposition: In-patient comfort care until outpatient hospice   Time spent for the meeting: 10 minutes     Faith Rogue, DO  11/01/2023, 2:40 PM
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
## 2023-11-02 DIAGNOSIS — I4891 Unspecified atrial fibrillation: Secondary | ICD-10-CM | POA: Insufficient documentation

## 2023-11-02 DIAGNOSIS — G9340 Encephalopathy, unspecified: Secondary | ICD-10-CM | POA: Diagnosis not present

## 2023-11-02 MED ORDER — GLYCOPYRROLATE 0.2 MG/ML IJ SOLN: 0.2000 mg | INTRAMUSCULAR | Status: AC | PRN

## 2023-11-02 MED ORDER — HALOPERIDOL LACTATE 5 MG/ML IJ SOLN: 0.5000 mg | INTRAMUSCULAR | Status: AC | PRN

## 2023-11-02 MED ORDER — MORPHINE SULFATE (PF) 2 MG/ML IV SOLN: 1.0000 mg | INTRAVENOUS | Status: AC | PRN

## 2023-11-02 MED ORDER — LORAZEPAM 2 MG/ML IJ SOLN: 1.0000 mg | INTRAMUSCULAR | Status: AC | PRN

## 2023-11-02 NOTE — Progress Notes (Signed)
Summary:  Duane [name] is a 88 y.o. male with a past medical history notable for HTN, paroxysmal A Fib, DM, CAD with stent, CKD III, previous CVA, HLD and dementia presenting for altered mental status and admitted to the IMTS on 12/14 for new stroke.   Subjective:  Patient is resting in bed.   Objective: Vital signs in last 24 hours: Vitals:   11/01/23 0800 11/01/23 1045 11/01/23 1942 11/02/23 0842  BP: (!) 150/89 (!) 160/70 (!) 132/97 139/67  Pulse: (!) 122 (!) 120 (!) 127 (!) 125  Resp: 20  18 (!) 22  Temp: 99.8 F (37.7 C) 99.1 F (37.3 C) 99.6 F (37.6 C) 99.3 F (37.4 C)  TempSrc: Axillary Axillary Axillary Oral  SpO2: 100% 100% 97% 92%  Weight: 89.8 kg     Height: 5\' 11"  (1.803 m)      Weight change: 0 kg  Intake/Output Summary (Last 24 hours) at 11/02/2023 0925 Last data filed at 11/01/2023 1933 Gross per 24 hour  Intake 897.72 ml  Output --  Net 897.72 ml   Physical Exam General:resting in bed, appears comfortable Pulmonary:normal effort on RA, symmetrical chest rise and fall       Latest Ref Rng & Units 10/31/2023    5:48 AM 10/30/2023    7:07 AM 10/29/2023    6:19 AM  CBC  WBC 4.0 - 10.5 K/uL 10.4  7.9  7.8   Hemoglobin 13.0 - 17.0 g/dL 84.6  96.2  95.2   Hematocrit 39.0 - 52.0 % 32.2  32.5  34.3   Platelets 150 - 400 K/uL 219  207  208        Latest Ref Rng & Units 10/31/2023    5:48 AM 10/30/2023    3:10 PM 10/30/2023    7:07 AM  CMP  Glucose 70 - 99 mg/dL 841  324  401   BUN 8 - 23 mg/dL 19  18  18    Creatinine 0.61 - 1.24 mg/dL 0.27  2.53  6.64   Sodium 135 - 145 mmol/L 134  135  137   Potassium 3.5 - 5.1 mmol/L 3.7  3.7  3.8   Chloride 98 - 111 mmol/L 103  102  104   CO2 22 - 32 mmol/L 19  19  22    Calcium 8.9 - 10.3 mg/dL 8.8  8.9  9.3     Assessment/Plan: Duane [name] is a 88 y.o. male with a past medical history notable for HTN, paroxysmal A Fib, DM, CAD with stent, CKD III, previous CVA, HLD and dementia presenting for  altered mental status and admitted to the IMTS on 12/14 for new stroke, on hospital day 3.   Principal Problem:   Acute encephalopathy Active Problems:   Acute cardioembolic stroke (HCC)  #Dysphagia, Aphasia 2/2 acute CVA #History of advance dementia Patient presented for altered mental status from SNF. Due to concerns for worsening speech, lethargy and left-sided weakness, brain MRI obtained on 12/14 showing new restricted diffusion present within the posterior limb of the right internal capsule c/w left sided symptoms, acute infarct in the right hippocampus and restricted diffusion in the right occipital lobe. On exam today, patient's speech incomprehensible. He was able to follow simple commands, so appears to exhibit expressive aphasia.  Patient's swallowing function evaluated by SLP, who continues to recommend NPO status and medication administration via alternative means.   -Patient's family decided to transition to comfort care, currently awaiting inpatient hospice placement  -Comfort care  orders are placed  #Anemia #Afib RVR #TIIDM #HTN #CAD #History of MI, Angina #CKD III #Dementia #HLD -Patient's family decided to transition to comfort care, currently awaiting inpatient hospice placement  -Will discontinue life prolonging treatment.   Dispo: Inpatient Hospice    LOS: 3 days   Faith Rogue, DO 11/02/2023, 9:25 AM

## 2023-11-02 NOTE — Care Management Important Message (Signed)
Important Message  Patient Details  Name: XANDER JIRAK MRN: [id number] Date of Birth: Dec 25, 1934   Important Message Given:  Yes - Medicare IM  Patient left prior to IM delivery will mail a copy to the patient home address.    Aixa Corsello 11/02/2023, 4:02 PM

## 2023-11-02 NOTE — TOC Transition Note (Signed)
Transition of Care [facility]) - Discharge Note   Patient Details  Name: Duane [name] MRN: [id number] Date of Birth: 03-23-1935  Transition of Care [facility]) CM/SW Contact:  Baldemar Lenis, LCSW Phone Number: 11/02/2023, 11:12 AM   Clinical Narrative:   CSW updated by hospice liaison that bed is available and family is in agreement with transfer today, daughter completed consent paperwork. Hospice bed is ready. CSW updated MD about discharge and arranged for PTAR for next available.    Final next level of care: Hospice Medical Facility Barriers to Discharge: Barriers Resolved   Patient Goals and CMS Choice Patient states their goals for this hospitalization and ongoing recovery are:: patient unable to participate in goal setting, not fully oriented CMS Medicare.gov Compare Post Acute Care list provided to:: Patient Represenative (must comment) Choice offered to / list presented to : Adult Children [place] ownership interest in [facility].provided to:: Adult Children    Discharge Placement                Patient to be transferred to facility by: PTAR Name of family member notified: Daughter Patient and family notified of of transfer: 11/02/23  Discharge Plan and Services Additional resources added to the After Visit Summary for       Post Acute Care Choice: Hospice                               Social Drivers of Health (SDOH) Interventions SDOH Screenings   Food Insecurity: No Food Insecurity (11/01/2023)  Housing: Low Risk  (11/01/2023)  Transportation Needs: No Transportation Needs (11/01/2023)  Utilities: Not At Risk (11/01/2023)  Tobacco Use: Low Risk  (10/29/2023)     Readmission Risk Interventions     No data to display
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
